# Patient Record
Sex: Male | Born: 1935 | Race: Black or African American | Hispanic: No | Smoking: Former smoker
Health system: Southern US, Community
[De-identification: ages and names within clinical notes are randomized; demographics above are authoritative.]

## PROBLEM LIST (undated history)

## (undated) DIAGNOSIS — I639 Cerebral infarction, unspecified: Secondary | ICD-10-CM

## (undated) DIAGNOSIS — N183 Chronic kidney disease, stage 3 unspecified: Secondary | ICD-10-CM

## (undated) DIAGNOSIS — I493 Ventricular premature depolarization: Secondary | ICD-10-CM

## (undated) DIAGNOSIS — E785 Hyperlipidemia, unspecified: Secondary | ICD-10-CM

## (undated) DIAGNOSIS — I10 Essential (primary) hypertension: Secondary | ICD-10-CM

## (undated) DIAGNOSIS — I4891 Unspecified atrial fibrillation: Secondary | ICD-10-CM

## (undated) DIAGNOSIS — I2510 Atherosclerotic heart disease of native coronary artery without angina pectoris: Secondary | ICD-10-CM

## (undated) DIAGNOSIS — I4819 Other persistent atrial fibrillation: Secondary | ICD-10-CM

## (undated) DIAGNOSIS — R001 Bradycardia, unspecified: Secondary | ICD-10-CM

## (undated) HISTORY — DX: Ventricular premature depolarization: I49.3

## (undated) HISTORY — DX: Cerebral infarction, unspecified: I63.9

## (undated) HISTORY — DX: Hyperlipidemia, unspecified: E78.5

## (undated) HISTORY — DX: Atherosclerotic heart disease of native coronary artery without angina pectoris: I25.10

## (undated) HISTORY — DX: Bradycardia, unspecified: R00.1

## (undated) HISTORY — DX: Chronic kidney disease, stage 3 unspecified: N18.30

## (undated) HISTORY — DX: Other persistent atrial fibrillation: I48.19

## (undated) HISTORY — PX: OTHER SURGICAL HISTORY: SHX169

---
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
## 1998-06-22 ENCOUNTER — Ambulatory Visit (HOSPITAL_BASED_OUTPATIENT_CLINIC_OR_DEPARTMENT_OTHER): Admission: RE | Admit: 1998-06-22 | Discharge: 1998-06-22 | Payer: Self-pay | Admitting: Ophthalmology

## 2000-03-06 ENCOUNTER — Ambulatory Visit (HOSPITAL_COMMUNITY): Admission: RE | Admit: 2000-03-06 | Discharge: 2000-03-06 | Payer: Self-pay | Admitting: Gastroenterology

## 2002-07-01 HISTORY — PX: COLONOSCOPY: SHX174

## 2003-02-11 ENCOUNTER — Ambulatory Visit: Admission: RE | Admit: 2003-02-11 | Discharge: 2003-05-13 | Payer: Self-pay | Admitting: Radiation Oncology

## 2003-02-28 ENCOUNTER — Encounter: Admission: RE | Admit: 2003-02-28 | Discharge: 2003-02-28 | Payer: Self-pay | Admitting: Urology

## 2003-02-28 ENCOUNTER — Encounter: Payer: Self-pay | Admitting: Urology

## 2003-04-08 ENCOUNTER — Ambulatory Visit (HOSPITAL_BASED_OUTPATIENT_CLINIC_OR_DEPARTMENT_OTHER): Admission: RE | Admit: 2003-04-08 | Discharge: 2003-04-08 | Payer: Self-pay | Admitting: Urology

## 2003-04-08 ENCOUNTER — Encounter: Payer: Self-pay | Admitting: Urology

## 2003-04-08 ENCOUNTER — Ambulatory Visit (HOSPITAL_COMMUNITY): Admission: RE | Admit: 2003-04-08 | Discharge: 2003-04-08 | Payer: Self-pay | Admitting: Urology

## 2007-03-16 ENCOUNTER — Ambulatory Visit: Payer: Self-pay | Admitting: Internal Medicine

## 2007-03-31 ENCOUNTER — Ambulatory Visit: Payer: Self-pay | Admitting: Internal Medicine

## 2007-09-15 DIAGNOSIS — K648 Other hemorrhoids: Secondary | ICD-10-CM | POA: Insufficient documentation

## 2007-09-15 DIAGNOSIS — C61 Malignant neoplasm of prostate: Secondary | ICD-10-CM | POA: Insufficient documentation

## 2007-09-15 DIAGNOSIS — I10 Essential (primary) hypertension: Secondary | ICD-10-CM | POA: Insufficient documentation

## 2007-09-15 DIAGNOSIS — K520 Gastroenteritis and colitis due to radiation: Secondary | ICD-10-CM | POA: Insufficient documentation

## 2007-09-15 DIAGNOSIS — I2510 Atherosclerotic heart disease of native coronary artery without angina pectoris: Secondary | ICD-10-CM | POA: Insufficient documentation

## 2008-07-01 DIAGNOSIS — C61 Malignant neoplasm of prostate: Secondary | ICD-10-CM

## 2008-07-01 HISTORY — DX: Malignant neoplasm of prostate: C61

## 2008-07-01 HISTORY — PX: INSERTION PROSTATE RADIATION SEED: SUR718

## 2009-05-29 ENCOUNTER — Emergency Department (HOSPITAL_COMMUNITY): Admission: EM | Admit: 2009-05-29 | Discharge: 2009-05-29 | Payer: Self-pay | Admitting: Emergency Medicine

## 2009-07-10 ENCOUNTER — Encounter: Admission: RE | Admit: 2009-07-10 | Discharge: 2009-07-10 | Payer: Self-pay | Admitting: Urology

## 2009-07-11 ENCOUNTER — Ambulatory Visit (HOSPITAL_BASED_OUTPATIENT_CLINIC_OR_DEPARTMENT_OTHER): Admission: RE | Admit: 2009-07-11 | Discharge: 2009-07-11 | Payer: Self-pay | Admitting: Urology

## 2010-09-15 LAB — POCT I-STAT 4, (NA,K, GLUC, HGB,HCT)
Glucose, Bld: 75 mg/dL (ref 70–99)
HCT: 39 % (ref 39.0–52.0)
Hemoglobin: 13.3 g/dL (ref 13.0–17.0)
Potassium: 4.2 mEq/L (ref 3.5–5.1)
Sodium: 142 mEq/L (ref 135–145)

## 2010-09-15 LAB — CBC
HCT: 38.8 % — ABNORMAL LOW (ref 39.0–52.0)
Hemoglobin: 12.9 g/dL — ABNORMAL LOW (ref 13.0–17.0)
MCHC: 33.2 g/dL (ref 30.0–36.0)
MCV: 94.1 fL (ref 78.0–100.0)
Platelets: 278 10*3/uL (ref 150–400)
RBC: 4.13 MIL/uL — ABNORMAL LOW (ref 4.22–5.81)
RDW: 14.4 % (ref 11.5–15.5)
WBC: 4.4 10*3/uL (ref 4.0–10.5)

## 2010-09-15 LAB — BASIC METABOLIC PANEL
BUN: 18 mg/dL (ref 6–23)
CO2: 31 mEq/L (ref 19–32)
Calcium: 9.3 mg/dL (ref 8.4–10.5)
Chloride: 108 mEq/L (ref 96–112)
Creatinine, Ser: 1.37 mg/dL (ref 0.4–1.5)
GFR calc Af Amer: >60 mL/min (ref >60)
GFR calc non Af Amer: 50 mL/min — ABNORMAL LOW (ref >60)
Glucose, Bld: 106 mg/dL — ABNORMAL HIGH (ref 70–99)
Potassium: 4.8 mEq/L (ref 3.5–5.1)
Sodium: 141 mEq/L (ref 135–145)

## 2010-11-13 NOTE — Assessment & Plan Note (Signed)
[facility]                         GASTROENTEROLOGY OFFICE NOTE   NAME:HOPKINSJa, Ohman                     MRN:          [id number]  DATE:03/16/2007                            DOB:          12-14-1929    CHIEF COMPLAINT:  Rectal bleeding.  He also has a history of polyps.   The patient presents.  [facility]  [facility].  He had a negative colonoscopy in 2004 with Dr.  Corinda Gubler.  He apparently has had polyps in the past.  I do not have any  pathology in the chart.  He has intermittent rectal bleeding problems.  Apparently he has had prostate cancer, though he says they told he did  not have it, but he describes prostate seed implantation at the [facility] or here.  It is not entirely clear to me from talking to him.   The rectal bleeding has been on the toilet paper;  certainly after  eating dairy products he seems to have the problem.  It has been a  couple of months since he has had any.  He had been [facility]  to seek attention for either this or his history of polyps.   PAST MEDICAL HISTORY:  1. Multinodular goiter.  2. Hypertension.  3. Diabetes mellitus.  4. Coronary artery bypass grafting in 2007.  I do not think he has had      an MI, though we are not sure.  5. The original colonoscopy was 1998, and polyps were cauterized to      destruction.  6. 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 Vernie Ammons.   FAMILY HISTORY:  No colon cancer.   SOCIAL HISTORY:  He is divorced, retired from the [employer].  He is  a Cytogeneticist.   REVIEW OF SYSTEMS:  See in my medical history form.   PHYSICAL EXAM:  Overweight black man.  Height 5 feet 11 inches, weight 259 pounds, blood pressure 130/70, pulse  68.  Eyes anicteric.  LUNGS:  Clear.  HEART:  S1, S2.  ABDOMEN:  Soft, nontender.  PSYCH:  He is alert and oriented x3.   ASSESSMENT:   Rectal bleeding.  He has a history of colon polyps, though  the last several exams have been clear of polyps, and the original  polyps were tiny polyps cauterized to destruction.   PLAN:  Schedule colonoscopy to investigate the rectal bleeding.  This  certainly could come from seed implantation and radiation proctitis.  Colorectal neoplasia should be excluded.   We will hold his glipizide the morning of the procedure.   Risks, benefits, and indications were explained.  He understands and  agrees to proceed.     Iva Boop, MD,FACG  Electronically Signed    CEG/MedQ  DD: 03/16/2007  DT: 03/16/2007  Job #: [id number]

## 2010-11-16 NOTE — Procedures (Signed)
[facility]  Patient:    Dillon Smith, Dillon Smith                       MRN: [id number] Proc. Date: 03/06/00 Attending:  Ulyess Mort, M.D. [facility]                           Procedure Report  PROCEDURE:  Colonoscopy.  INDICATIONS FOR PROCEDURE:  This is a follow-up examination after 3 years for a patient who has had recurrent polyps. The scheduled for the procedure as noted.  ANESTHESIA:  The patient was premedicated with 7 mg of Versed, 75 mcg of fentanyl IV.  DESCRIPTION OF PROCEDURE:  The Olympus colonoscope was advanced through the entire colon without difficulty. On retraction, a well prepped colon revealed 2 small polyps approximately 4 mm in size that were sessile. I cauterized them both with the hot biopsy cautery. The endoscope was then retracted completely. The patient tolerated the procedure nicely.  IMPRESSION:  Colonoscopy to the cecum with several polyps and some mild diverticular disease. The polyps were cauterized to destruction as noted, I think this patient should have annual colon screenings and a repeat colonoscopic examination within 3 years. DD:  03/06/00 TD:  03/07/00 Job: [id number]

## 2010-11-16 NOTE — Op Note (Signed)
NAME:  Dillon Smith, Dillon Smith                        ACCOUNT NO.:  [id number]   MEDICAL RECORD NO.:  [id number]                   PATIENT TYPE:  AMB   LOCATION:  [facility]                                 FACILITY:  [facility]   PHYSICIAN:  Mark C. Vernie Ammons, M.D.               DATE OF BIRTH:  11/29/1929   DATE OF PROCEDURE:  04/08/2003  DATE OF DISCHARGE:                                 OPERATIVE REPORT   PREOPERATIVE DIAGNOSIS:  Adenocarcinoma of the prostate.   POSTOPERATIVE DIAGNOSIS:  Adenocarcinoma of the prostate.   PROCEDURE:  I-125 seed implant.   SURGEON:  Mark C. Vernie Ammons, M.D.   RADIATION ONCOLOGIST:  Wynn Banker, M.D.   DRAINS:  3 French Foley catheter.   ESTIMATED BLOOD LOSS:  Less than 5 mL.   NUMBER OF NEEDLES:  27   NUMBER OF SEEDS:  104   COMPLICATIONS:  None.   INDICATIONS FOR PROCEDURE:  The patient is a 77 year old black male who was  found to have an elevated PSA of 4.5 with a ratio of 12%. A biopsy of the  prostate revealed no evidence of extra prostatic spread and Gleason 6 in 2  of 4 cores from the right lobe, BPH from the left lobe at the time of his  biopsy. We discussed the risks, complications and alternatives, he  understands and wished to proceed with surgery.   DESCRIPTION OF PROCEDURE:  After informed consent, the patient brought to  the major OR, placed on the table, administered general anesthesia and then  moved to the dorsal lithotomy position. The transrectal ultrasound probe was  then placed in the rectum and affixed to the stand. A 16 French Foley  catheter was placed in the bladder with dilute contrast and the placement of  the preop seeds based on preoperative planning was compared to real-time  ultrasonographic use of the prostate. This was then used for further  planning in order to afford the greatest amount of coverage with least risk  of urethral irritation and/or injury.   After completing the real time planning using both  real-time ultrasonography  and fluoroscopy, two gold seeds were placed followed by stabilizing needles  and the placement of all needles was performed without difficulty.   The ultrasound was probed, Foley catheter were then removed. Cystoscopy was  performed and revealed a very small (approximately 1 mm) area at about 11  o'clock just inside the bladder neck where a portion of the Vicryl could be  visualized. No seeds could be visualized in this location. The bladder was  fully inspected and noted to be free of tumor, stones, seeds or inflammatory  lesions. Retroflexion of the scope revealed no seeds protruding from the  base of the prostate into the bladder. The cystoscope was then removed and a  new 16 French Foley catheter was then placed in the bladder, connected to  close system drainage. The patient tolerated  the procedure well. There were  no intraoperative complications.   She will be maintained on Cipro 250 mg b.i.d. for 10 days, Flomax 0.4 mg  h.s. #30, and given a prescription for Vicodin ES #28 and Pyridium 200 mg  #30. He will followup in my office in three weeks. His catheter will be left  indwelling overnight and will be removed by the patient in the morning.                                               Mark C. Vernie Ammons, M.D.    MCO/MEDQ  D:  04/08/2003  T:  04/08/2003  Job:  [id number]   cc:   Wynn Banker, M.D.  [address] [facility]  [place]  [place]  [address]  Fax: [phone]

## 2019-08-02 DIAGNOSIS — U071 COVID-19: Secondary | ICD-10-CM

## 2019-08-02 HISTORY — DX: COVID-19: U07.1

## 2019-08-15 ENCOUNTER — Emergency Department (HOSPITAL_COMMUNITY): Payer: No Typology Code available for payment source | Admitting: Anesthesiology

## 2019-08-15 ENCOUNTER — Inpatient Hospital Stay (HOSPITAL_COMMUNITY)
Admission: EM | Admit: 2019-08-15 | Discharge: 2019-08-17 | DRG: 061 | Disposition: A | Discharge status: Home Health | Payer: No Typology Code available for payment source | Attending: Neurology | Admitting: Neurology

## 2019-08-15 ENCOUNTER — Emergency Department (HOSPITAL_COMMUNITY): Payer: No Typology Code available for payment source

## 2019-08-15 ENCOUNTER — Encounter (HOSPITAL_COMMUNITY)
Admission: EM | Disposition: A | Discharge status: Home Health | Payer: Self-pay | Source: Home / Self Care | Attending: Neurology

## 2019-08-15 ENCOUNTER — Encounter (HOSPITAL_COMMUNITY): Payer: Self-pay | Admitting: Student in an Organized Health Care Education/Training Program

## 2019-08-15 ENCOUNTER — Other Ambulatory Visit: Payer: Self-pay

## 2019-08-15 DIAGNOSIS — I361 Nonrheumatic tricuspid (valve) insufficiency: Secondary | ICD-10-CM | POA: Diagnosis not present

## 2019-08-15 DIAGNOSIS — G8194 Hemiplegia, unspecified affecting left nondominant side: Secondary | ICD-10-CM | POA: Diagnosis present

## 2019-08-15 DIAGNOSIS — Z923 Personal history of irradiation: Secondary | ICD-10-CM | POA: Diagnosis not present

## 2019-08-15 DIAGNOSIS — U071 COVID-19: Secondary | ICD-10-CM | POA: Diagnosis present

## 2019-08-15 DIAGNOSIS — E871 Hypo-osmolality and hyponatremia: Secondary | ICD-10-CM | POA: Diagnosis present

## 2019-08-15 DIAGNOSIS — Z87891 Personal history of nicotine dependence: Secondary | ICD-10-CM

## 2019-08-15 DIAGNOSIS — R739 Hyperglycemia, unspecified: Secondary | ICD-10-CM | POA: Diagnosis present

## 2019-08-15 DIAGNOSIS — I10 Essential (primary) hypertension: Secondary | ICD-10-CM | POA: Diagnosis present

## 2019-08-15 DIAGNOSIS — E785 Hyperlipidemia, unspecified: Secondary | ICD-10-CM | POA: Diagnosis present

## 2019-08-15 DIAGNOSIS — R29810 Facial weakness: Secondary | ICD-10-CM | POA: Diagnosis present

## 2019-08-15 DIAGNOSIS — I482 Chronic atrial fibrillation, unspecified: Secondary | ICD-10-CM | POA: Diagnosis present

## 2019-08-15 DIAGNOSIS — H9190 Unspecified hearing loss, unspecified ear: Secondary | ICD-10-CM | POA: Diagnosis present

## 2019-08-15 DIAGNOSIS — R29713 NIHSS score 13: Secondary | ICD-10-CM | POA: Diagnosis present

## 2019-08-15 DIAGNOSIS — Z8546 Personal history of malignant neoplasm of prostate: Secondary | ICD-10-CM

## 2019-08-15 DIAGNOSIS — I63431 Cerebral infarction due to embolism of right posterior cerebral artery: Secondary | ICD-10-CM | POA: Diagnosis present

## 2019-08-15 DIAGNOSIS — I340 Nonrheumatic mitral (valve) insufficiency: Secondary | ICD-10-CM | POA: Diagnosis not present

## 2019-08-15 DIAGNOSIS — R1310 Dysphagia, unspecified: Secondary | ICD-10-CM | POA: Diagnosis present

## 2019-08-15 DIAGNOSIS — I639 Cerebral infarction, unspecified: Secondary | ICD-10-CM | POA: Diagnosis present

## 2019-08-15 DIAGNOSIS — I2510 Atherosclerotic heart disease of native coronary artery without angina pectoris: Secondary | ICD-10-CM | POA: Diagnosis present

## 2019-08-15 DIAGNOSIS — I351 Nonrheumatic aortic (valve) insufficiency: Secondary | ICD-10-CM | POA: Diagnosis not present

## 2019-08-15 DIAGNOSIS — I4891 Unspecified atrial fibrillation: Secondary | ICD-10-CM | POA: Diagnosis present

## 2019-08-15 DIAGNOSIS — N1831 Chronic kidney disease, stage 3a: Secondary | ICD-10-CM | POA: Diagnosis present

## 2019-08-15 DIAGNOSIS — R471 Dysarthria and anarthria: Secondary | ICD-10-CM | POA: Diagnosis present

## 2019-08-15 DIAGNOSIS — I63411 Cerebral infarction due to embolism of right middle cerebral artery: Principal | ICD-10-CM | POA: Diagnosis present

## 2019-08-15 DIAGNOSIS — Z7901 Long term (current) use of anticoagulants: Secondary | ICD-10-CM

## 2019-08-15 DIAGNOSIS — N1832 Chronic kidney disease, stage 3b: Secondary | ICD-10-CM | POA: Diagnosis present

## 2019-08-15 DIAGNOSIS — I129 Hypertensive chronic kidney disease with stage 1 through stage 4 chronic kidney disease, or unspecified chronic kidney disease: Secondary | ICD-10-CM | POA: Diagnosis present

## 2019-08-15 DIAGNOSIS — E7800 Pure hypercholesterolemia, unspecified: Secondary | ICD-10-CM | POA: Diagnosis not present

## 2019-08-15 DIAGNOSIS — I480 Paroxysmal atrial fibrillation: Secondary | ICD-10-CM | POA: Diagnosis not present

## 2019-08-15 HISTORY — DX: Atherosclerotic heart disease of native coronary artery without angina pectoris: I25.10

## 2019-08-15 HISTORY — DX: Essential (primary) hypertension: I10

## 2019-08-15 LAB — CBC
HCT: 44.9 % (ref 39.0–52.0)
Hemoglobin: 14.9 g/dL (ref 13.0–17.0)
MCH: 30.7 pg (ref 26.0–34.0)
MCHC: 33.2 g/dL (ref 30.0–36.0)
MCV: 92.4 fL (ref 80.0–100.0)
Platelets: 277 10*3/uL (ref 150–400)
RBC: 4.86 MIL/uL (ref 4.22–5.81)
RDW: 13 % (ref 11.5–15.5)
WBC: 3.7 10*3/uL — ABNORMAL LOW (ref 4.0–10.5)
nRBC: 0 % (ref 0.0–0.2)

## 2019-08-15 LAB — APTT: aPTT: 37 seconds — ABNORMAL HIGH (ref 24–36)

## 2019-08-15 LAB — COMPREHENSIVE METABOLIC PANEL
ALT: 41 U/L (ref 0–44)
AST: 48 U/L — ABNORMAL HIGH (ref 15–41)
Albumin: 2.3 g/dL — ABNORMAL LOW (ref 3.5–5.0)
Alkaline Phosphatase: 53 U/L (ref 38–126)
Anion gap: 12 (ref 5–15)
BUN: 23 mg/dL (ref 8–23)
CO2: 20 mmol/L — ABNORMAL LOW (ref 22–32)
Calcium: 8.5 mg/dL — ABNORMAL LOW (ref 8.9–10.3)
Chloride: 99 mmol/L (ref 98–111)
Creatinine, Ser: 1.98 mg/dL — ABNORMAL HIGH (ref 0.61–1.24)
GFR calc Af Amer: 34 mL/min — ABNORMAL LOW (ref >60)
GFR calc non Af Amer: 29 mL/min — ABNORMAL LOW (ref >60)
Glucose, Bld: 126 mg/dL — ABNORMAL HIGH (ref 70–99)
Potassium: 4.5 mmol/L (ref 3.5–5.1)
Sodium: 131 mmol/L — ABNORMAL LOW (ref 135–145)
Total Bilirubin: 0.7 mg/dL (ref 0.3–1.2)
Total Protein: 6.5 g/dL (ref 6.5–8.1)

## 2019-08-15 LAB — DIFFERENTIAL
Abs Immature Granulocytes: 0.01 10*3/uL (ref 0.00–0.07)
Basophils Absolute: 0 10*3/uL (ref 0.0–0.1)
Basophils Relative: 0 %
Eosinophils Absolute: 0 10*3/uL (ref 0.0–0.5)
Eosinophils Relative: 0 %
Immature Granulocytes: 0 %
Lymphocytes Relative: 28 %
Lymphs Abs: 1 10*3/uL (ref 0.7–4.0)
Monocytes Absolute: 0.3 10*3/uL (ref 0.1–1.0)
Monocytes Relative: 9 %
Neutro Abs: 2.3 10*3/uL (ref 1.7–7.7)
Neutrophils Relative %: 63 %

## 2019-08-15 LAB — I-STAT CHEM 8, ED
BUN: 24 mg/dL — ABNORMAL HIGH (ref 8–23)
Calcium, Ion: 1.02 mmol/L — ABNORMAL LOW (ref 1.15–1.40)
Chloride: 101 mmol/L (ref 98–111)
Creatinine, Ser: 1.9 mg/dL — ABNORMAL HIGH (ref 0.61–1.24)
Glucose, Bld: 121 mg/dL — ABNORMAL HIGH (ref 70–99)
HCT: 45 % (ref 39.0–52.0)
Hemoglobin: 15.3 g/dL (ref 13.0–17.0)
Potassium: 4.5 mmol/L (ref 3.5–5.1)
Sodium: 132 mmol/L — ABNORMAL LOW (ref 135–145)
TCO2: 23 mmol/L (ref 22–32)

## 2019-08-15 LAB — CBG MONITORING, ED: Glucose-Capillary: 108 mg/dL — ABNORMAL HIGH (ref 70–99)

## 2019-08-15 LAB — PROTIME-INR
INR: 1.1 (ref 0.8–1.2)
Prothrombin Time: 14 seconds (ref 11.4–15.2)

## 2019-08-15 LAB — HEPARIN LEVEL (UNFRACTIONATED): Heparin Unfractionated: <0.1 IU/mL — ABNORMAL LOW (ref 0.30–0.70)

## 2019-08-15 SURGERY — IR WITH ANESTHESIA
Anesthesia: General

## 2019-08-15 MED ORDER — ACETAMINOPHEN 325 MG PO TABS: 650.0000 mg | ORAL_TABLET | ORAL | Status: DC | PRN

## 2019-08-15 MED ORDER — ASPIRIN 81 MG PO CHEW
CHEWABLE_TABLET | ORAL | Status: AC
  Filled 2019-08-15: qty 1

## 2019-08-15 MED ORDER — FENTANYL CITRATE (PF) 100 MCG/2ML IJ SOLN
INTRAMUSCULAR | Status: AC
  Filled 2019-08-15: qty 2

## 2019-08-15 MED ORDER — PANTOPRAZOLE SODIUM 40 MG IV SOLR
40.0000 mg | Freq: Every day | INTRAVENOUS | Status: DC
  Administered 2019-08-15: 40 mg via INTRAVENOUS
  Filled 2019-08-15: qty 40

## 2019-08-15 MED ORDER — CHLORHEXIDINE GLUCONATE CLOTH 2 % EX PADS
6.0000 | MEDICATED_PAD | Freq: Every day | CUTANEOUS | Status: DC
  Administered 2019-08-15: 6 via TOPICAL

## 2019-08-15 MED ORDER — EPTIFIBATIDE 20 MG/10ML IV SOLN
INTRAVENOUS | Status: AC
  Filled 2019-08-15: qty 10

## 2019-08-15 MED ORDER — SODIUM CHLORIDE 0.9% FLUSH
3.0000 mL | Freq: Once | INTRAVENOUS | Status: AC
  Administered 2019-08-15: 3 mL via INTRAVENOUS

## 2019-08-15 MED ORDER — CLOPIDOGREL BISULFATE 300 MG PO TABS
ORAL_TABLET | ORAL | Status: AC
  Filled 2019-08-15: qty 1

## 2019-08-15 MED ORDER — ACETAMINOPHEN 160 MG/5ML PO SOLN: 650.0000 mg | ORAL | Status: DC | PRN

## 2019-08-15 MED ORDER — ALTEPLASE (STROKE) FULL DOSE INFUSION
0.9000 mg/kg | Freq: Once | INTRAVENOUS | Status: AC
  Administered 2019-08-15: 84.2 mg via INTRAVENOUS
  Filled 2019-08-15: qty 100

## 2019-08-15 MED ORDER — LABETALOL HCL 5 MG/ML IV SOLN: 20.0000 mg | Freq: Once | INTRAVENOUS | Status: DC

## 2019-08-15 MED ORDER — NITROGLYCERIN 1 MG/10 ML FOR IR/CATH LAB
INTRA_ARTERIAL | Status: AC
  Filled 2019-08-15: qty 10

## 2019-08-15 MED ORDER — CLEVIDIPINE BUTYRATE 0.5 MG/ML IV EMUL: 0.0000 mg/h | INTRAVENOUS | Status: DC

## 2019-08-15 MED ORDER — VERAPAMIL HCL 2.5 MG/ML IV SOLN
INTRAVENOUS | Status: AC
  Filled 2019-08-15: qty 2

## 2019-08-15 MED ORDER — SENNOSIDES-DOCUSATE SODIUM 8.6-50 MG PO TABS: 1.0000 | ORAL_TABLET | Freq: Every evening | ORAL | Status: DC | PRN

## 2019-08-15 MED ORDER — TICAGRELOR 90 MG PO TABS
ORAL_TABLET | ORAL | Status: AC
  Filled 2019-08-15: qty 2

## 2019-08-15 MED ORDER — CEFAZOLIN SODIUM-DEXTROSE 2-4 GM/100ML-% IV SOLN
INTRAVENOUS | Status: AC
  Filled 2019-08-15: qty 100

## 2019-08-15 MED ORDER — ACETAMINOPHEN 650 MG RE SUPP: 650.0000 mg | RECTAL | Status: DC | PRN

## 2019-08-15 MED ORDER — SODIUM CHLORIDE 0.9 % IV SOLN: INTRAVENOUS | Status: DC

## 2019-08-15 MED ORDER — SODIUM CHLORIDE 0.9 % IV SOLN
50.0000 mL | Freq: Once | INTRAVENOUS | Status: AC
  Administered 2019-08-15: 50 mL via INTRAVENOUS

## 2019-08-15 MED ORDER — TIROFIBAN HCL IN NACL 5-0.9 MG/100ML-% IV SOLN
INTRAVENOUS | Status: AC
  Filled 2019-08-15: qty 100

## 2019-08-15 MED ORDER — STROKE: EARLY STAGES OF RECOVERY BOOK
Freq: Once | Status: AC
  Filled 2019-08-15: qty 1

## 2019-08-15 MED ORDER — IOHEXOL 350 MG/ML SOLN
100.0000 mL | Freq: Once | INTRAVENOUS | Status: AC | PRN
  Administered 2019-08-15: 100 mL via INTRAVENOUS

## 2019-08-15 NOTE — H&P (Signed)
STROKE H&P S/P tPA Neurological ICU admission  CC: Left-sided weakness, right-sided gaze, slurred speech  History is obtained from: Family, EMS  HPI: Dillon Smith is a 86 y.o. male veteran of the [place] with a history of atrial fibrillation not on anticoagulation, coronary artery disease and hypertension who gets his medical care at the [facility], presented via EMS when a friend noted that the patient was slurring his words, had a left facial droop and weak on the left side and had a gaze preference to the right side.  He was seen last normal at 4:30 PM by a family friend and noted later around 6 PM with the symptoms described above. He was brought in by EMS as a code stroke. Evaluated on the bridge.  Van positive for LVO. Taken for stat CT head.  No evidence of bleed.  IV TPA started. Case discussed with endovascular and IR team activated. There was a significant amount of delay in obtaining vessel imaging due to inability to obtain proper access.  He only had 1 line which was being used for TPA and it was not at a location amenable for CT angio or perfusion. After obtaining vessel imaging, there was no emergent proximal large vessel occlusion.  CT perfusion study did not yield an interpretable result. Interventional code was canceled at that time  No chest pain shortness of breath nausea vomiting.  No tingling numbness.  No headache. Received for short of his COVID-19 vaccination yesterday.  LKW: 1630 hrs. on 08/15/2019 tpa given?:  Yes Premorbid modified Rankin scale (mRS): 2 -lives independently, takes care of ADLs and finances independently.  Sometimes uses a cane very rarely a walker for balance if needed but is ambulatory without support otherwise.   ROS: Review of systems obtained and is negative except noted in the HPI  Past Medical History:  Diagnosis Date  . A-fib ([place])   . Coronary artery disease   . Hypertension    History reviewed. No pertinent family  history. No family still stroke  Social History:   reports that he has quit smoking. He has never used smokeless tobacco. No history on file for alcohol and drug.  Medications  Current Facility-Administered Medications:  .   stroke: mapping our early stages of recovery book, , Does not apply, Once, Amie Portland, MD .  alteplase (ACTIVASE) 1 mg/mL infusion 84.2 mg, 0.9 mg/kg, Intravenous, Once **FOLLOWED BY** 0.9 %  sodium chloride infusion, 50 mL, Intravenous, Once, Amie Portland, MD .  0.9 %  sodium chloride infusion, , Intravenous, Continuous, Amie Portland, MD .  acetaminophen (TYLENOL) tablet 650 mg, 650 mg, Oral, Q4H PRN **OR** acetaminophen (TYLENOL) 160 MG/5ML solution 650 mg, 650 mg, Per Tube, Q4H PRN **OR** acetaminophen (TYLENOL) suppository 650 mg, 650 mg, Rectal, Q4H PRN, Amie Portland, MD .  labetalol (NORMODYNE) injection 20 mg, 20 mg, Intravenous, Once, Stopped at 08/15/19 2109 **AND** clevidipine (CLEVIPREX) infusion 0.5 mg/mL, 0-21 mg/hr, Intravenous, Continuous, Amie Portland, MD, Stopped at 08/15/19 2109 .  pantoprazole (PROTONIX) injection 40 mg, 40 mg, Intravenous, QHS, Amie Portland, MD .  senna-docusate (Senokot-S) tablet 1 tablet, 1 tablet, Oral, QHS PRN, Amie Portland, MD .  sodium chloride flush (NS) 0.9 % injection 3 mL, 3 mL, Intravenous, Once, Plunkett, Whitney, MD  Current Outpatient Medications:  .  amLODipine (NORVASC) 10 MG tablet, Take 10 mg by mouth daily., Disp: , Rfl:  .  aspirin EC 81 MG tablet, Take 81 mg by mouth daily., Disp: , Rfl:  .  metoprolol tartrate (LOPRESSOR) 50 MG tablet, Take 50 mg by mouth 2 (two) times daily., Disp: , Rfl:  .  atorvastatin (LIPITOR) 20 MG tablet, Take 20 mg by mouth at bedtime. , Disp: , Rfl:   Exam: Current vital signs: BP (!) 143/63   Pulse 88   Temp 98.6 F (37 C) (Oral)   Resp 16   Wt 93.5 kg   SpO2 96%  Vital signs in last 24 hours: Temp:  [98.6 F (37 C)] 98.6 F (37 C) (02/14 2100) Pulse Rate:  [88] 88  (02/14 2100) Resp:  [16] 16 (02/14 2100) BP: (143)/(63) 143/63 (02/14 2100) SpO2:  [96 %-98 %] 96 % (02/14 2100) Weight:  [93.5 kg] 93.5 kg (02/14 2101) General: Awake alert and oriented x3 HEENT: Normocephalic atraumatic CVS: Regular rate rhythm Lungs clear to auscultation Abdomen nondistended nontender Extremities warm well perfused with trace edema Neurological exam Mental status: Awake alert oriented x3 Severely dysarthric speech No evidence of aphasia Cranial nerves: Pupils are equal round reactive to light, extraocular movement exam shows a forced right gaze, right homonymous hemianopsia, left lower facial weakness, extremely hard of hearing in both ears, tongue and palate were midline. Motor exam: He is antigravity on the left upper extremity without drift but is slightly weaker when compared to the right side.  Left lower extremity had a mild vertical drift.  Right upper and lower extremity with no drift. Sensory exam: Diminished on the left There is extinction on double some to stimulation No obvious dysmetria NIH stroke scale of 13  Repeat exam after TPA: Awake alert oriented x3 Mild dysarthria No aphasia No forced gaze and is able to look both sides without difficulty. No hemianopsia Facial droop is very mild on the left, improved from before. No vertical drift in both upper extremities.  Mild drift in left lower extremity. Minimal sensory deficit NIH 4  Labs I have reviewed labs in epic and the results pertinent to this consultation are:   CBC    Component Value Date/Time   WBC 3.7 (L) 08/15/2019 1942   RBC 4.86 08/15/2019 1942   HGB 15.3 08/15/2019 1948   HCT 45.0 08/15/2019 1948   PLT 277 08/15/2019 1942   MCV 92.4 08/15/2019 1942   MCH 30.7 08/15/2019 1942   MCHC 33.2 08/15/2019 1942   RDW 13.0 08/15/2019 1942   LYMPHSABS 1.0 08/15/2019 1942   MONOABS 0.3 08/15/2019 1942   EOSABS 0.0 08/15/2019 1942   BASOSABS 0.0 08/15/2019 1942    CMP      Component Value Date/Time   NA 132 (L) 08/15/2019 1948   K 4.5 08/15/2019 1948   CL 101 08/15/2019 1948   CO2 20 (L) 08/15/2019 1942   GLUCOSE 121 (H) 08/15/2019 1948   BUN 24 (H) 08/15/2019 1948   CREATININE 1.90 (H) 08/15/2019 1948   CALCIUM 8.5 (L) 08/15/2019 1942   PROT 6.5 08/15/2019 1942   ALBUMIN 2.3 (L) 08/15/2019 1942   AST 48 (H) 08/15/2019 1942   ALT 41 08/15/2019 1942   ALKPHOS 53 08/15/2019 1942   BILITOT 0.7 08/15/2019 1942   GFRNONAA 29 (L) 08/15/2019 1942   GFRAA 34 (L) 08/15/2019 1942    Imaging I have reviewed the images obtained: cT-scan of the brain no acute changes.  Aspects 10. CTA head and neck with no emergent proximal LVO.  Right M3 vessel branch occlusion serving the right frontal operculum.  Perfusion data unreliable due to motion.  Assessment:  86 year old past medical history of  coronary artery disease, atrial fibrillation on anticoagulation and hypertension presenting with symptoms consistent of a right MCA syndrome, no bleed on CT head given IV TPA. Interventional radiology team was activated for possible intervention given the clinical exam consistent with large vessel occlusion but on vessel imaging, the occlusion was likely to distal or had probably recannulized after TPA.  Delays in the process: Initially obtaining history from family, then with vascular access for vessel imaging.   Plan: Acute Ischemic Stroke Cerebral infarction due to embolism of right middle cerebral artery  Acuity: Acute Current Suspected Etiology: Cardioembolic Continue Evaluation:  -Admit to: Neurological ICU -Hold Aspirin until 24 hour post tPA neuroimaging is stable and without evidence of bleeding -Blood pressure control, goal of SYS <180 TPA protocol -MRI/ECHO/A1C/Lipid panel. -Hyperglycemia management per SSI to maintain glucose 140-180mg /dL. -PT/OT/ST therapies and recommendations when able  CNS -Close neuro monitoring  Dysarthria Dysphagia following  cerebral infarction  -NPO until cleared by speech -ST -Advance diet as tolerated  Hemiplegia and hemiparesis following cerebral infarction affecting left non-dominant side  -PT/OT -PM&R consult   RESP No active issues   CV Essential hypertension -Aggressive BP control, goal SBP <180 -Labetalol as needed Cleviprex drip as needed  -Obtain echo  Hyperlipidemia, unspecified  - Statin for goal LDL < 70  Chronic atrial fibrillation -Rate control -will need anticoagulation upon discharge  HEME No active issues Check a.m. labs  ENDO Mild hyperglycemia Goal A1c less than 7 Glucose parameters as above  GI/GU GFR 34-CKD stage III. Obtain records from the outside hospital to compare baseline. Gentle hydration with normal saline 75 cc an hour  Fluids/electrolytes -Check a.m. labs.  Replete as necessary. Mild hyponatremia noted.  Continue with normal saline.  ID Possible Aspiration PNA -CXR -NPO -Monitor  Prophylaxis DVT: SCDs GI: PPI Bowel: Docusate senna  Diet: NPO until cleared by speech  Code Status: Full Code     THE FOLLOWING WERE PRESENT ON ADMISSION: Acute ischemic stroke, hemiparesis, possible aspiration pneumonia, CKD stage III, hyponatremia, atrial fibrillation not on anticoagulation   Case discussed with interventional neuroradiology Dr. Estanislado Pandy over the phone and code team was activated.  IR code was then canceled after no proximal LVO identified on vessel imaging.  Appreciate prompt response from the IR team.  Care was discussed with the ED provider Dr. Maryan Rued.  Treatment plan and further management discussed with brother Kamdin Kenon in the emergency room in person. Mr. Rush Landmark Christy-brother-phone number-cell [phone]   -- Amie Portland, MD [facility] Pager: [phone] If 7pm to 7am, please call on call as listed on AMION.   CRITICAL CARE ATTESTATION Performed by: Amie Portland, MD Total critical care time:  45 minutes Critical care time was exclusive of separately billable procedures and treating other patients and/or supervising APPs/Residents/Students Critical care was necessary to treat or prevent imminent or life-threatening deterioration due to acute ischemic stroke, IV thrombolysis This patient is critically ill and at significant risk for neurological worsening and/or death and care requires constant monitoring. Critical care was time spent personally by me on the following activities: development of treatment plan with patient and/or surrogate as well as nursing, discussions with consultants, evaluation of patient's response to treatment, examination of patient, obtaining history from patient or surrogate, ordering and performing treatments and interventions, ordering and review of laboratory studies, ordering and review of radiographic studies, pulse oximetry, re-evaluation of patient's condition, participation in multidisciplinary rounds and medical decision making of high complexity in the care of this patient.

## 2019-08-15 NOTE — ED Triage Notes (Signed)
Pt brought in by Ems as code stroke. LKW at 73 when family came home. Pt alert upon arrival with slurred speech and R gaze deviation with L sided weakness. Per EMS pt has Afib on aspirin. Not taking any other blood thinners per family.  CBG 141 by EMS 18G LFA established by EMS. Flushes with no s/s of infiltration and is secured with tape and tegaderm Labs drawn and sent by phlebotomy at time of arrival

## 2019-08-15 NOTE — ED Notes (Signed)
Family in family room A

## 2019-08-15 NOTE — ED Notes (Signed)
Late entry due to continuous pt care. TPA initiated with pharmacy in [place] room.  Pt on portable cardiac monitor entire time.  VS remained stable and WNL entire time Pt transported to Rm 35 and placed on continuous monitor

## 2019-08-15 NOTE — ED Notes (Signed)
Report called and given to [facility], Therapist, sports. All questions answered

## 2019-08-15 NOTE — ED Notes (Signed)
Pt transported to [place] in [facility] with all belongings. Pt alert, speaking in full sentences. Neuro checks stable. Breathing easy, non-labored. Equal rise and fall of chest noted. VSS

## 2019-08-15 NOTE — Progress Notes (Signed)
PHARMACIST CODE STROKE RESPONSE  Notified to mix tPA at 1950 by Dr. Rory Percy Delivered tPA to RN at 1953  tPA dose = 8.4mg  bolus over 1 minute followed by 75.8mg  for a total dose of 84.2mg  over 1 hour  Issues/delays encountered (if applicable):   Duanne Limerick PharmD. BCPS 08/15/19 8:00 PM

## 2019-08-15 NOTE — Anesthesia Preprocedure Evaluation (Signed)
Anesthesia Evaluation Anesthesia Physical Anesthesia Plan  ASA: IV and emergent  Anesthesia Plan: General   Post-op Pain Management:    Induction: Intravenous, Rapid sequence and Cricoid pressure planned  PONV Risk Score and Plan: 2 and Ondansetron and Dexamethasone  Airway Management Planned: Oral ETT  Additional Equipment: Arterial line  Intra-op Plan:   Post-operative Plan: Possible Post-op intubation/ventilation  Informed Consent:     History available from chart only and Only emergency history available  Plan Discussed with: CRNA and Surgeon  Anesthesia Plan Comments:         Anesthesia Quick Evaluation

## 2019-08-15 NOTE — ED Provider Notes (Signed)
[facility] Provider Note   CSN: [id number] Arrival date & time: 08/15/19  1939     History No chief complaint on file.   [facility] is a 86 y.o. male.  Patient is an 86 year old male with a history of hypertension, CAD, prostate cancer status post treatment who presents today as a code stroke.  EMS reported that family law saw him normal at 4:30 PM and around 7:00 they noticed that he was looking to his right and having weakness on the left side of his body.  EMS reports neglect, slurred speech which reportedly is different for the patient.  There is no history of anticoagulation use.  Patient is not able to give any significant history but does deny any pain, shortness of breath, cough, nausea or vomiting.  The history is provided by the patient, the EMS personnel and a relative. The history is limited by the condition of the patient.       No past medical history on file.  Patient Active Problem List   Diagnosis Date Noted  . CARCINOMA, PROSTATE 09/15/2007  . HYPERTENSION 09/15/2007  . CAD 09/15/2007  . HEMORRHOIDS, INTERNAL 09/15/2007  . RADIATION PROCTITIS 09/15/2007         No family history on file.  Social History   Tobacco Use  . Smoking status: Not on file  Substance Use Topics  . Alcohol use: Not on file  . Drug use: Not on file    Home Medications Prior to Admission medications   Not on File    Allergies    Patient has no allergy information on record.  Review of Systems   Review of Systems  Unable to perform ROS: Mental status change    Physical Exam Updated Vital Signs There were no vitals taken for this visit.  Physical Exam Vitals and nursing note reviewed.  Constitutional:      General: He is not in acute distress.    Appearance: Normal appearance. He is well-developed and normal weight.  HENT:     Head: Normocephalic and atraumatic.  Eyes:     Conjunctiva/sclera: Conjunctivae normal.      Pupils: Pupils are equal, round, and reactive to light.  Neck:     Comments: Palpable soft mass present over the sternal notch.   Cardiovascular:     Rate and Rhythm: Normal rate and regular rhythm.     Pulses: Normal pulses.     Heart sounds: No murmur.  Pulmonary:     Effort: Pulmonary effort is normal. No respiratory distress.     Breath sounds: Normal breath sounds. No wheezing or rales.  Abdominal:     General: There is no distension.     Palpations: Abdomen is soft.     Tenderness: There is no abdominal tenderness. There is no guarding or rebound.  Musculoskeletal:        General: No tenderness. Normal range of motion.     Cervical back: Normal range of motion and neck supple.  Skin:    General: Skin is warm and dry.     Findings: No erythema or rash.  Neurological:     Mental Status: He is alert.     Motor: No pronator drift.     Comments: Oriented to person and place.  Left hemineglect with right eye deviation.  Patient will not look past midline.  No notable weakness in the left side.  Mild slurred speech.  Psychiatric:  Mood and Affect: Mood normal.        Behavior: Behavior normal.     ED Results / Procedures / Treatments   Labs (all labs ordered are listed, but only abnormal results are displayed) Labs Reviewed  APTT - Abnormal; Notable for the following components:      Result Value   aPTT 37 (*)    All other components within normal limits  CBC - Abnormal; Notable for the following components:   WBC 3.7 (*)    All other components within normal limits  COMPREHENSIVE METABOLIC PANEL - Abnormal; Notable for the following components:   Sodium 131 (*)    CO2 20 (*)    Glucose, Bld 126 (*)    Creatinine, Ser 1.98 (*)    Calcium 8.5 (*)    Albumin 2.3 (*)    AST 48 (*)    GFR calc non Af Amer 29 (*)    GFR calc Af Amer 34 (*)    All other components within normal limits  HEPARIN LEVEL (UNFRACTIONATED) - Abnormal; Notable for the following  components:   Heparin Unfractionated <0.10 (*)    All other components within normal limits  I-STAT CHEM 8, ED - Abnormal; Notable for the following components:   Sodium 132 (*)    BUN 24 (*)    Creatinine, Ser 1.90 (*)    Glucose, Bld 121 (*)    Calcium, Ion 1.02 (*)    All other components within normal limits  CBG MONITORING, ED - Abnormal; Notable for the following components:   Glucose-Capillary 108 (*)    All other components within normal limits  PROTIME-INR  DIFFERENTIAL  HEMOGLOBIN A1C  LIPID PANEL    EKG EKG Interpretation  Date/Time:  Sunday August 15 2019 20:53:19 EST Ventricular Rate:  97 PR Interval:    QRS Duration: 103 QT Interval:  371 QTC Calculation: 472 R Axis:   134 Text Interpretation: new Atrial fibrillation Ventricular premature complex Right axis deviation Probable anteroseptal infarct, old Minimal ST depression Confirmed by Blanchie Dessert [phone]) on 08/15/2019 9:13:26 PM   Radiology CT Code Stroke CTA Head W/WO contrast  Result Date: 08/15/2019 CLINICAL DATA:  Right-sided gaze disturbance. Slurred speech. Left-sided weakness. EXAM: CT ANGIOGRAPHY HEAD AND NECK CT PERFUSION BRAIN TECHNIQUE: Multidetector CT imaging of the head and neck was performed using the standard protocol during bolus administration of intravenous contrast. Multiplanar CT image reconstructions and MIPs were obtained to evaluate the vascular anatomy. Carotid stenosis measurements (when applicable) are obtained utilizing NASCET criteria, using the distal internal carotid diameter as the denominator. Multiphase CT imaging of the brain was performed following IV bolus contrast injection. Subsequent parametric perfusion maps were calculated using RAPID software. CONTRAST:  19mL OMNIPAQUE IOHEXOL 350 MG/ML SOLN COMPARISON:  Head CT earlier same day. FINDINGS: CTA NECK FINDINGS Aortic arch: Aortic atherosclerosis. Branching pattern is normal without origin stenosis. Right carotid system:  Common carotid artery widely patent to the bifurcation. Soft and calcified plaque at the carotid bifurcation and ICA bulb. Minimal diameter of the ICA bulb is 3.7 mm. Compared to a more distal cervical ICA diameter of 4.3 mm, this indicates a 15% stenosis. Left carotid system: Common carotid artery widely patent to the bifurcation. Calcified plaque at the carotid bifurcation and ICA bulb. Minimal diameter is 4.5 mm, the same as the more distal cervical ICA. Therefore there is no stenosis. The cervical ICA is markedly tortuous. Vertebral arteries: There is calcified plaque at both vertebral artery origins. Severe stenosis  on the right, 80% or greater. Moderate stenosis on the left, 50-75%. Both vertebral arteries do show flow to the foramen magnum. Skeleton: Ordinary spondylosis. Other neck: Soft tissue lipoma of the anterior midline neck. Upper chest: Mild peripheral scarring on the right. Review of the MIP images confirms the above findings CTA HEAD FINDINGS Anterior circulation: Both internal carotid arteries are patent through the skull base and siphon regions. There is siphon atherosclerotic calcification but without stenosis greater than 30%. No large or medium vessel occlusion identified on the left. There is a single azygos anterior cerebral artery supplying both anterior cerebral artery territories. On the right, there is some atherosclerotic irregularity of the M1 segment but no flow limiting stenosis. There is an occluded M3 branch serving the frontal operculum. Posterior circulation: Both vertebral arteries are patent through the foramen magnum to the basilar. Before atherosclerotic disease but without stenosis greater than 30%. 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 Superior cerebellar and posterior cerebral arteries show flow. Venous sinuses: Patent and normal. Anatomic variants: None significant. Review of  the MIP images confirms the above findings CT Brain Perfusion Findings: ASPECTS: 10 Perfusion data is not reliable. In looking at the source data, there does appear to be a small region of diminished cerebral blood volume in the right frontal operculum, estimated at about 15 cc. There is also diminished cerebral blood flow and delayed mean transit time in that region. 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 Perfusion data is unreliable because of motion. Viewing the source images, the patient does demonstrate a region of diminished cerebral blood volume, cerebral blood flow and prolonged mean transit time in the frontal operculum ir region, affecting about 15-20 cc of brain. Atherosclerotic disease at the aortic arch. Atherosclerotic disease at both carotid bifurcations. 15% ICA stenosis on the right. No stenosis on the left. Atherosclerotic disease in the carotid siphon regions but without stenosis greater than 30%. Stenosis of both vertebral artery origins, severe on the right and moderate on the left. 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 Findings discussed with Dr. Rory Percy during performance. Electronically Signed   By: Nelson Chimes M.D.   On: 08/15/2019 20:56   CT Code Stroke CTA Neck W/WO contrast  Result Date: 08/15/2019 CLINICAL DATA:  Right-sided gaze disturbance. Slurred speech. Left-sided weakness. EXAM: CT ANGIOGRAPHY HEAD AND NECK CT PERFUSION BRAIN TECHNIQUE: Multidetector CT imaging of the head and neck was performed using the standard protocol during bolus administration of intravenous contrast. Multiplanar CT image reconstructions and MIPs were obtained to evaluate the vascular anatomy. Carotid stenosis measurements (when applicable) are obtained utilizing NASCET criteria, using the distal internal carotid diameter as the denominator. Multiphase CT imaging of the brain was performed following IV bolus contrast injection. Subsequent parametric  perfusion maps were calculated using RAPID software. CONTRAST:  145mL OMNIPAQUE IOHEXOL 350 MG/ML SOLN COMPARISON:  Head CT earlier same day. FINDINGS: CTA NECK FINDINGS Aortic arch: Aortic atherosclerosis. Branching pattern is normal without origin stenosis. Right carotid system: Common carotid artery widely patent to the bifurcation. Soft and calcified plaque at the carotid bifurcation and ICA bulb. Minimal diameter of the ICA bulb is 3.7 mm. Compared to a more distal cervical ICA diameter of 4.3 mm, this indicates a 15% stenosis. Left carotid system: Common carotid artery widely patent to the bifurcation. Calcified plaque at the carotid bifurcation and ICA bulb. Minimal diameter is 4.5  mm, the same as the more distal cervical ICA. Therefore there is no stenosis. The cervical ICA is markedly tortuous. Vertebral arteries: There is calcified plaque at both vertebral artery origins. Severe stenosis on the right, 80% or greater. Moderate stenosis on the left, 50-75%. Both vertebral arteries do show flow to the foramen magnum. Skeleton: Ordinary spondylosis. Other neck: Soft tissue lipoma of the anterior midline neck. Upper chest: Mild peripheral scarring on the right. Review of the MIP images confirms the above findings CTA HEAD FINDINGS Anterior circulation: Both internal carotid arteries are patent through the skull base and siphon regions. There is siphon atherosclerotic calcification but without stenosis greater than 30%. No large or medium vessel occlusion identified on the left. There is a single azygos anterior cerebral artery supplying both anterior cerebral artery territories. On the right, there is some atherosclerotic irregularity of the M1 segment but no flow limiting stenosis. There is an occluded M3 branch serving the frontal operculum. Posterior circulation: Both vertebral arteries are patent through the foramen magnum to the basilar. Before atherosclerotic disease but without stenosis greater than 30%.  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 Superior cerebellar and posterior cerebral arteries show flow. Venous sinuses: Patent and normal. Anatomic variants: None significant. Review of the MIP images confirms the above findings CT Brain Perfusion Findings: ASPECTS: 10 Perfusion data is not reliable. In looking at the source data, there does appear to be a small region of diminished cerebral blood volume in the right frontal operculum, estimated at about 15 cc. There is also diminished cerebral blood flow and delayed mean transit time in that region. 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 Perfusion data is unreliable because of motion. Viewing the source images, the patient does demonstrate a region of diminished cerebral blood volume, cerebral blood flow and prolonged mean transit time in the frontal operculum ir region, affecting about 15-20 cc of brain. Atherosclerotic disease at the aortic arch. Atherosclerotic disease at both carotid bifurcations. 15% ICA stenosis on the right. No stenosis on the left. Atherosclerotic disease in the carotid siphon regions but without stenosis greater than 30%. Stenosis of both vertebral artery origins, severe on the right and moderate on the left. 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 Findings discussed with Dr. Rory Percy during performance. Electronically Signed   By: Nelson Chimes M.D.   On: 08/15/2019 20:56   CT Code Stroke Cerebral Perfusion with contrast  Result Date: 08/15/2019 CLINICAL DATA:  Right-sided gaze disturbance. Slurred speech. Left-sided weakness. EXAM: CT ANGIOGRAPHY HEAD AND NECK CT PERFUSION BRAIN TECHNIQUE: Multidetector CT imaging of the head and neck was performed using the standard protocol during bolus administration of intravenous contrast. Multiplanar CT image reconstructions and MIPs were obtained to  evaluate the vascular anatomy. Carotid stenosis measurements (when applicable) are obtained utilizing NASCET criteria, using the distal internal carotid diameter as the denominator. Multiphase CT imaging of the brain was performed following IV bolus contrast injection. Subsequent parametric perfusion maps were calculated using RAPID software. CONTRAST:  133mL OMNIPAQUE IOHEXOL 350 MG/ML SOLN COMPARISON:  Head CT earlier same day. FINDINGS: CTA NECK FINDINGS Aortic arch: Aortic atherosclerosis. Branching pattern is normal without origin stenosis. Right carotid system: Common carotid artery widely patent to the bifurcation. Soft and calcified plaque at the carotid bifurcation and ICA bulb. Minimal diameter of the ICA bulb is 3.7 mm. Compared to a more distal cervical ICA  diameter of 4.3 mm, this indicates a 15% stenosis. Left carotid system: Common carotid artery widely patent to the bifurcation. Calcified plaque at the carotid bifurcation and ICA bulb. Minimal diameter is 4.5 mm, the same as the more distal cervical ICA. Therefore there is no stenosis. The cervical ICA is markedly tortuous. Vertebral arteries: There is calcified plaque at both vertebral artery origins. Severe stenosis on the right, 80% or greater. Moderate stenosis on the left, 50-75%. Both vertebral arteries do show flow to the foramen magnum. Skeleton: Ordinary spondylosis. Other neck: Soft tissue lipoma of the anterior midline neck. Upper chest: Mild peripheral scarring on the right. Review of the MIP images confirms the above findings CTA HEAD FINDINGS Anterior circulation: Both internal carotid arteries are patent through the skull base and siphon regions. There is siphon atherosclerotic calcification but without stenosis greater than 30%. No large or medium vessel occlusion identified on the left. There is a single azygos anterior cerebral artery supplying both anterior cerebral artery territories. On the right, there is some atherosclerotic  irregularity of the M1 segment but no flow limiting stenosis. There is an occluded M3 branch serving the frontal operculum. Posterior circulation: Both vertebral arteries are patent through the foramen magnum to the basilar. Before atherosclerotic disease but without stenosis greater than 30%. 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 Superior cerebellar and posterior cerebral arteries show flow. Venous sinuses: Patent and normal. Anatomic variants: None significant. Review of the MIP images confirms the above findings CT Brain Perfusion Findings: ASPECTS: 10 Perfusion data is not reliable. In looking at the source data, there does appear to be a small region of diminished cerebral blood volume in the right frontal operculum, estimated at about 15 cc. There is also diminished cerebral blood flow and delayed mean transit time in that region. 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 Perfusion data is unreliable because of motion. Viewing the source images, the patient does demonstrate a region of diminished cerebral blood volume, cerebral blood flow and prolonged mean transit time in the frontal operculum ir region, affecting about 15-20 cc of brain. Atherosclerotic disease at the aortic arch. Atherosclerotic disease at both carotid bifurcations. 15% ICA stenosis on the right. No stenosis on the left. Atherosclerotic disease in the carotid siphon regions but without stenosis greater than 30%. Stenosis of both vertebral artery origins, severe on the right and moderate on the left. 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 Findings discussed with Dr. Rory Percy during performance. Electronically Signed   By: Nelson Chimes M.D.   On: 08/15/2019 20:56   CT HEAD CODE STROKE WO CONTRAST  Result Date: 08/15/2019 CLINICAL DATA:  Code stroke. Right-sided gaze disturbance. Slurred speech.  Left-sided weakness. EXAM: CT HEAD WITHOUT CONTRAST TECHNIQUE: Contiguous axial images were obtained from the base of the skull through the vertex without intravenous contrast. COMPARISON:  None. FINDINGS: Brain: Age related generalized volume loss. Chronic small-vessel ischemic changes affect the cerebral hemispheric white matter. Focal low-density in the external capsule regions bilaterally consistent chronic small vessel change. No sign of acute cortical infarction, mass lesion, hemorrhage, hydrocephalus or extra-axial collection. Vascular: There is atherosclerotic calcification of the major vessels at the base of the brain. Skull: Negative Sinuses/Orbits: Clear/normal Other: None ASPECTS ([place] Stroke Program Early CT Score) - Ganglionic level infarction (caudate, lentiform nuclei, internal capsule, insula, M1-M3 cortex): 7 - Supraganglionic infarction (M4-M6 cortex): 3 Total score (  0-10 with 10 being normal): 10 IMPRESSION: 1. No acute finding by CT. Age related atrophy. Chronic small-vessel ischemic changes throughout the brain as above. 2. ASPECTS is 10. 3. These results were communicated to Dr. Rory Percy at 7:53 pmon 2/14/2021by text page via the [facility] messaging system. Electronically Signed   By: Nelson Chimes M.D.   On: 08/15/2019 19:54    Procedures Procedures (including critical care time)  Medications Ordered in ED Medications  sodium chloride flush (NS) 0.9 % injection 3 mL (has no administration in time range)    ED Course  I have reviewed the triage vital signs and the nursing notes.  Pertinent labs & imaging results that were available during my care of the patient were reviewed by me and considered in my medical decision making (see chart for details).    MDM Rules/Calculators/A&P                      Elderly male presenting today as a code stroke with left-sided hemineglect and last seen normal was 430 as reported by family who was at the scene.  Patient does not have notable  weakness.  Patient will not track beyond midline and favors the right.  No recent history in the medical records on this patient.  His airway is intact and he is awake and alert.  He is in no distress at this time.  Neurology is bedside upon patient arrival.  He went directly to CT which is negative for acute findings.  Family will be contacted for collateral information to discern if he is a TPA candidate.  9:32 PM Patient's PTT and INR are within normal limits, CMP with chronic kidney disease with creatinine of 1.9.  Unclear if that is baseline.  CBC without acute findings.  CTA showed right M3 branch vessel occlusion with unreliable perfusion due to motion.  Given patient's exam and symptoms he was started on TPA.  Now patient will cross midline and look to the left.  He is now moving his left side and recognizing it spontaneously.  Patient will be admitted to the ICU under neurology.  CRITICAL CARE Performed by: Madalee Altmann Total critical care time: 30 minutes Critical care time was exclusive of separately billable procedures and treating other patients. Critical care was necessary to treat or prevent imminent or life-threatening deterioration. Critical care was time spent personally by me on the following activities: development of treatment plan with patient and/or surrogate as well as nursing, discussions with consultants, evaluation of patient's response to treatment, examination of patient, obtaining history from patient or surrogate, ordering and performing treatments and interventions, ordering and review of laboratory studies, ordering and review of radiographic studies, pulse oximetry and re-evaluation of patient's condition.   Final Clinical Impression(s) / ED Diagnoses Final diagnoses:  Acute ischemic stroke [facility])    Rx / DC Orders ED Discharge Orders    None       Blanchie Dessert, MD 08/15/19 2134

## 2019-08-16 ENCOUNTER — Inpatient Hospital Stay (HOSPITAL_COMMUNITY): Payer: No Typology Code available for payment source

## 2019-08-16 DIAGNOSIS — I340 Nonrheumatic mitral (valve) insufficiency: Secondary | ICD-10-CM

## 2019-08-16 DIAGNOSIS — I480 Paroxysmal atrial fibrillation: Secondary | ICD-10-CM

## 2019-08-16 DIAGNOSIS — I10 Essential (primary) hypertension: Secondary | ICD-10-CM

## 2019-08-16 DIAGNOSIS — U071 COVID-19: Secondary | ICD-10-CM

## 2019-08-16 DIAGNOSIS — I361 Nonrheumatic tricuspid (valve) insufficiency: Secondary | ICD-10-CM

## 2019-08-16 DIAGNOSIS — I351 Nonrheumatic aortic (valve) insufficiency: Secondary | ICD-10-CM

## 2019-08-16 DIAGNOSIS — E7800 Pure hypercholesterolemia, unspecified: Secondary | ICD-10-CM

## 2019-08-16 LAB — LIPID PANEL
Cholesterol: 146 mg/dL (ref 0–200)
HDL: 23 mg/dL — ABNORMAL LOW (ref >40)
LDL Cholesterol: 101 mg/dL — ABNORMAL HIGH (ref 0–99)
Total CHOL/HDL Ratio: 6.3 RATIO
Triglycerides: 109 mg/dL (ref <150)
VLDL: 22 mg/dL (ref 0–40)

## 2019-08-16 LAB — BASIC METABOLIC PANEL
Anion gap: 15 (ref 5–15)
BUN: 23 mg/dL (ref 8–23)
CO2: 20 mmol/L — ABNORMAL LOW (ref 22–32)
Calcium: 8.6 mg/dL — ABNORMAL LOW (ref 8.9–10.3)
Chloride: 102 mmol/L (ref 98–111)
Creatinine, Ser: 1.68 mg/dL — ABNORMAL HIGH (ref 0.61–1.24)
GFR calc Af Amer: 42 mL/min — ABNORMAL LOW (ref >60)
GFR calc non Af Amer: 36 mL/min — ABNORMAL LOW (ref >60)
Glucose, Bld: 102 mg/dL — ABNORMAL HIGH (ref 70–99)
Potassium: 4.7 mmol/L (ref 3.5–5.1)
Sodium: 137 mmol/L (ref 135–145)

## 2019-08-16 LAB — CBC
HCT: 46.7 % (ref 39.0–52.0)
Hemoglobin: 15.3 g/dL (ref 13.0–17.0)
MCH: 30.8 pg (ref 26.0–34.0)
MCHC: 32.8 g/dL (ref 30.0–36.0)
MCV: 94 fL (ref 80.0–100.0)
Platelets: 285 10*3/uL (ref 150–400)
RBC: 4.97 MIL/uL (ref 4.22–5.81)
RDW: 13.2 % (ref 11.5–15.5)
WBC: 3.8 10*3/uL — ABNORMAL LOW (ref 4.0–10.5)
nRBC: 0 % (ref 0.0–0.2)

## 2019-08-16 LAB — RESPIRATORY PANEL BY RT PCR (FLU A&B, COVID)
Influenza A by PCR: NEGATIVE
Influenza B by PCR: NEGATIVE
SARS Coronavirus 2 by RT PCR: POSITIVE — AB

## 2019-08-16 LAB — MRSA PCR SCREENING: MRSA by PCR: NEGATIVE

## 2019-08-16 LAB — HEMOGLOBIN A1C
Hgb A1c MFr Bld: 6.3 % — ABNORMAL HIGH (ref 4.8–5.6)
Mean Plasma Glucose: 134.11 mg/dL

## 2019-08-16 LAB — ECHOCARDIOGRAM COMPLETE: Weight: 3301.61 oz

## 2019-08-16 MED ORDER — ATORVASTATIN CALCIUM 10 MG PO TABS: 20.0000 mg | ORAL_TABLET | Freq: Every day | ORAL | Status: DC

## 2019-08-16 MED ORDER — ASPIRIN EC 325 MG PO TBEC
325.0000 mg | DELAYED_RELEASE_TABLET | Freq: Every day | ORAL | Status: DC
  Administered 2019-08-16 – 2019-08-17 (×2): 325 mg via ORAL
  Filled 2019-08-16 (×2): qty 1

## 2019-08-16 MED ORDER — MUPIROCIN 2 % EX OINT
1.0000 "application " | TOPICAL_OINTMENT | Freq: Two times a day (BID) | CUTANEOUS | Status: DC
  Filled 2019-08-16: qty 22

## 2019-08-16 MED ORDER — PANTOPRAZOLE SODIUM 40 MG PO TBEC
40.0000 mg | DELAYED_RELEASE_TABLET | Freq: Every day | ORAL | Status: DC
  Administered 2019-08-16 – 2019-08-17 (×2): 40 mg via ORAL
  Filled 2019-08-16 (×2): qty 1

## 2019-08-16 MED ORDER — ATORVASTATIN CALCIUM 40 MG PO TABS
40.0000 mg | ORAL_TABLET | Freq: Every day | ORAL | Status: DC
  Administered 2019-08-16 – 2019-08-17 (×2): 40 mg via ORAL
  Filled 2019-08-16 (×2): qty 1

## 2019-08-16 MED ORDER — HYDRALAZINE HCL 20 MG/ML IJ SOLN: 5.0000 mg | INTRAMUSCULAR | Status: DC | PRN

## 2019-08-16 NOTE — Progress Notes (Signed)
MRI no show for 1700 appointment, no answer when I call. Stat head ordered to obtain 24 hr post TPA image.

## 2019-08-16 NOTE — Progress Notes (Signed)
STROKE TEAM PROGRESS NOTE   INTERVAL HISTORY Pt sitting in chair, awake alert and following commands, but hard of hearing. Slight left nasolabial fold flattening but moving all extremities equally. Passed swallow, on diet. MRI pending.   Vitals:   08/16/19 0630 08/16/19 0700 08/16/19 0730 08/16/19 0800  BP: (!) 127/57 (!) 148/70 (!) 145/81 (!) 142/82  Pulse: (!) 32 (!) 36 (!) 53 (!) 38  Resp: 15 10 18  (!) 21  Temp:      TempSrc:      SpO2: 94% 97% 95% 94%  Weight:        CBC:  Recent Labs  Lab 08/15/19 1942 08/15/19 1948  WBC 3.7*  --   NEUTROABS 2.3  --   HGB 14.9 15.3  HCT 44.9 45.0  MCV 92.4  --   PLT 277  --     Basic Metabolic Panel:  Recent Labs  Lab 08/15/19 1942 08/15/19 1948  NA 131* 132*  K 4.5 4.5  CL 99 101  CO2 20*  --   GLUCOSE 126* 121*  BUN 23 24*  CREATININE 1.98* 1.90*  CALCIUM 8.5*  --    Lipid Panel:     Component Value Date/Time   CHOL 146 08/16/2019 0242   TRIG 109 08/16/2019 0242   HDL 23 (L) 08/16/2019 0242   CHOLHDL 6.3 08/16/2019 0242   VLDL 22 08/16/2019 0242   LDLCALC 101 (H) 08/16/2019 0242   HgbA1c:  Lab Results  Component Value Date   HGBA1C 6.3 (H) 08/16/2019   Urine Drug Screen: No results found for: LABOPIA, COCAINSCRNUR, LABBENZ, AMPHETMU, THCU, LABBARB  Alcohol Level No results found for: [facility]  IMAGING past 48 hours CT Code Stroke CTA Head W/WO contrast  Result Date: 08/15/2019 CLINICAL DATA:  Right-sided gaze disturbance. Slurred speech. Left-sided weakness. EXAM: CT ANGIOGRAPHY HEAD AND NECK CT PERFUSION BRAIN TECHNIQUE: Multidetector CT imaging of the head and neck was performed using the standard protocol during bolus administration of intravenous contrast. Multiplanar CT image reconstructions and MIPs were obtained to evaluate the vascular anatomy. Carotid stenosis measurements (when applicable) are obtained utilizing NASCET criteria, using the distal internal carotid diameter as the denominator. Multiphase CT  imaging of the brain was performed following IV bolus contrast injection. Subsequent parametric perfusion maps were calculated using RAPID software. CONTRAST:  178mL OMNIPAQUE IOHEXOL 350 MG/ML SOLN COMPARISON:  Head CT earlier same day. FINDINGS: CTA NECK FINDINGS Aortic arch: Aortic atherosclerosis. Branching pattern is normal without origin stenosis. Right carotid system: Common carotid artery widely patent to the bifurcation. Soft and calcified plaque at the carotid bifurcation and ICA bulb. Minimal diameter of the ICA bulb is 3.7 mm. Compared to a more distal cervical ICA diameter of 4.3 mm, this indicates a 15% stenosis. Left carotid system: Common carotid artery widely patent to the bifurcation. Calcified plaque at the carotid bifurcation and ICA bulb. Minimal diameter is 4.5 mm, the same as the more distal cervical ICA. Therefore there is no stenosis. The cervical ICA is markedly tortuous. Vertebral arteries: There is calcified plaque at both vertebral artery origins. Severe stenosis on the right, 80% or greater. Moderate stenosis on the left, 50-75%. Both vertebral arteries do show flow to the foramen magnum. Skeleton: Ordinary spondylosis. Other neck: Soft tissue lipoma of the anterior midline neck. Upper chest: Mild peripheral scarring on the right. Review of the MIP images confirms the above findings CTA HEAD FINDINGS Anterior circulation: Both internal carotid arteries are patent through the skull base and siphon regions.  There is siphon atherosclerotic calcification but without stenosis greater than 30%. No large or medium vessel occlusion identified on the left. There is a single azygos anterior cerebral artery supplying both anterior cerebral artery territories. On the right, there is some atherosclerotic irregularity of the M1 segment but no flow limiting stenosis. There is an occluded M3 branch serving the frontal operculum. Posterior circulation: Both vertebral arteries are patent through the  foramen magnum to the basilar. Before atherosclerotic disease but without stenosis greater than 30%. 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 Superior cerebellar and posterior cerebral arteries show flow. Venous sinuses: Patent and normal. Anatomic variants: None significant. Review of the MIP images confirms the above findings CT Brain Perfusion Findings: ASPECTS: 10 Perfusion data is not reliable. In looking at the source data, there does appear to be a small region of diminished cerebral blood volume in the right frontal operculum, estimated at about 15 cc. There is also diminished cerebral blood flow and delayed mean transit time in that region. 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 Perfusion data is unreliable because of motion. Viewing the source images, the patient does demonstrate a region of diminished cerebral blood volume, cerebral blood flow and prolonged mean transit time in the frontal operculum ir region, affecting about 15-20 cc of brain. Atherosclerotic disease at the aortic arch. Atherosclerotic disease at both carotid bifurcations. 15% ICA stenosis on the right. No stenosis on the left. Atherosclerotic disease in the carotid siphon regions but without stenosis greater than 30%. Stenosis of both vertebral artery origins, severe on the right and moderate on the left. 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 Findings discussed with Dr. Rory Percy during performance. Electronically Signed   By: Nelson Chimes M.D.   On: 08/15/2019 20:56   CT Code Stroke CTA Neck W/WO contrast  Result Date: 08/15/2019 CLINICAL DATA:  Right-sided gaze disturbance. Slurred speech. Left-sided weakness. EXAM: CT ANGIOGRAPHY HEAD AND NECK CT PERFUSION BRAIN TECHNIQUE: Multidetector CT imaging of the head and neck was performed using the standard protocol during bolus administration of  intravenous contrast. Multiplanar CT image reconstructions and MIPs were obtained to evaluate the vascular anatomy. Carotid stenosis measurements (when applicable) are obtained utilizing NASCET criteria, using the distal internal carotid diameter as the denominator. Multiphase CT imaging of the brain was performed following IV bolus contrast injection. Subsequent parametric perfusion maps were calculated using RAPID software. CONTRAST:  162mL OMNIPAQUE IOHEXOL 350 MG/ML SOLN COMPARISON:  Head CT earlier same day. FINDINGS: CTA NECK FINDINGS Aortic arch: Aortic atherosclerosis. Branching pattern is normal without origin stenosis. Right carotid system: Common carotid artery widely patent to the bifurcation. Soft and calcified plaque at the carotid bifurcation and ICA bulb. Minimal diameter of the ICA bulb is 3.7 mm. Compared to a more distal cervical ICA diameter of 4.3 mm, this indicates a 15% stenosis. Left carotid system: Common carotid artery widely patent to the bifurcation. Calcified plaque at the carotid bifurcation and ICA bulb. Minimal diameter is 4.5 mm, the same as the more distal cervical ICA. Therefore there is no stenosis. The cervical ICA is markedly tortuous. Vertebral arteries: There is calcified plaque at both vertebral artery origins. Severe stenosis on the right, 80% or greater. Moderate stenosis on the left, 50-75%. Both vertebral arteries do show flow to the foramen magnum. Skeleton: Ordinary spondylosis. Other neck: Soft tissue lipoma of the anterior midline neck. Upper chest:  Mild peripheral scarring on the right. Review of the MIP images confirms the above findings CTA HEAD FINDINGS Anterior circulation: Both internal carotid arteries are patent through the skull base and siphon regions. There is siphon atherosclerotic calcification but without stenosis greater than 30%. No large or medium vessel occlusion identified on the left. There is a single azygos anterior cerebral artery supplying both  anterior cerebral artery territories. On the right, there is some atherosclerotic irregularity of the M1 segment but no flow limiting stenosis. There is an occluded M3 branch serving the frontal operculum. Posterior circulation: Both vertebral arteries are patent through the foramen magnum to the basilar. Before atherosclerotic disease but without stenosis greater than 30%. 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 Superior cerebellar and posterior cerebral arteries show flow. Venous sinuses: Patent and normal. Anatomic variants: None significant. Review of the MIP images confirms the above findings CT Brain Perfusion Findings: ASPECTS: 10 Perfusion data is not reliable. In looking at the source data, there does appear to be a small region of diminished cerebral blood volume in the right frontal operculum, estimated at about 15 cc. There is also diminished cerebral blood flow and delayed mean transit time in that region. 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 Perfusion data is unreliable because of motion. Viewing the source images, the patient does demonstrate a region of diminished cerebral blood volume, cerebral blood flow and prolonged mean transit time in the frontal operculum ir region, affecting about 15-20 cc of brain. Atherosclerotic disease at the aortic arch. Atherosclerotic disease at both carotid bifurcations. 15% ICA stenosis on the right. No stenosis on the left. Atherosclerotic disease in the carotid siphon regions but without stenosis greater than 30%. Stenosis of both vertebral artery origins, severe on the right and moderate on the left. 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 Findings discussed with Dr. Rory Percy during performance. Electronically Signed   By: Nelson Chimes M.D.   On: 08/15/2019 20:56   CT Code Stroke Cerebral Perfusion with  contrast  Result Date: 08/15/2019 CLINICAL DATA:  Right-sided gaze disturbance. Slurred speech. Left-sided weakness. EXAM: CT ANGIOGRAPHY HEAD AND NECK CT PERFUSION BRAIN TECHNIQUE: Multidetector CT imaging of the head and neck was performed using the standard protocol during bolus administration of intravenous contrast. Multiplanar CT image reconstructions and MIPs were obtained to evaluate the vascular anatomy. Carotid stenosis measurements (when applicable) are obtained utilizing NASCET criteria, using the distal internal carotid diameter as the denominator. Multiphase CT imaging of the brain was performed following IV bolus contrast injection. Subsequent parametric perfusion maps were calculated using RAPID software. CONTRAST:  161mL OMNIPAQUE IOHEXOL 350 MG/ML SOLN COMPARISON:  Head CT earlier same day. FINDINGS: CTA NECK FINDINGS Aortic arch: Aortic atherosclerosis. Branching pattern is normal without origin stenosis. Right carotid system: Common carotid artery widely patent to the bifurcation. Soft and calcified plaque at the carotid bifurcation and ICA bulb. Minimal diameter of the ICA bulb is 3.7 mm. Compared to a more distal cervical ICA diameter of 4.3 mm, this indicates a 15% stenosis. Left carotid system: Common carotid artery widely patent to the bifurcation. Calcified plaque at the carotid bifurcation and ICA bulb. Minimal diameter is 4.5 mm, the same as the more distal cervical ICA. Therefore there is no stenosis. The cervical ICA is markedly tortuous. Vertebral arteries: There is calcified plaque at both vertebral artery origins. Severe stenosis on the right, 80%  or greater. Moderate stenosis on the left, 50-75%. Both vertebral arteries do show flow to the foramen magnum. Skeleton: Ordinary spondylosis. Other neck: Soft tissue lipoma of the anterior midline neck. Upper chest: Mild peripheral scarring on the right. Review of the MIP images confirms the above findings CTA HEAD FINDINGS Anterior  circulation: Both internal carotid arteries are patent through the skull base and siphon regions. There is siphon atherosclerotic calcification but without stenosis greater than 30%. No large or medium vessel occlusion identified on the left. There is a single azygos anterior cerebral artery supplying both anterior cerebral artery territories. On the right, there is some atherosclerotic irregularity of the M1 segment but no flow limiting stenosis. There is an occluded M3 branch serving the frontal operculum. Posterior circulation: Both vertebral arteries are patent through the foramen magnum to the basilar. Before atherosclerotic disease but without stenosis greater than 30%. 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 Superior cerebellar and posterior cerebral arteries show flow. Venous sinuses: Patent and normal. Anatomic variants: None significant. Review of the MIP images confirms the above findings CT Brain Perfusion Findings: ASPECTS: 10 Perfusion data is not reliable. In looking at the source data, there does appear to be a small region of diminished cerebral blood volume in the right frontal operculum, estimated at about 15 cc. There is also diminished cerebral blood flow and delayed mean transit time in that region. 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 Perfusion data is unreliable because of motion. Viewing the source images, the patient does demonstrate a region of diminished cerebral blood volume, cerebral blood flow and prolonged mean transit time in the frontal operculum ir region, affecting about 15-20 cc of brain. Atherosclerotic disease at the aortic arch. Atherosclerotic disease at both carotid bifurcations. 15% ICA stenosis on the right. No stenosis on the left. Atherosclerotic disease in the carotid siphon regions but without stenosis greater than 30%. Stenosis of  both vertebral artery origins, severe on the right and moderate on the left. 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 Findings discussed with Dr. Rory Percy during performance. Electronically Signed   By: Nelson Chimes M.D.   On: 08/15/2019 20:56   CT HEAD CODE STROKE WO CONTRAST  Result Date: 08/15/2019 CLINICAL DATA:  Code stroke. Right-sided gaze disturbance. Slurred speech. Left-sided weakness. EXAM: CT HEAD WITHOUT CONTRAST TECHNIQUE: Contiguous axial images were obtained from the base of the skull through the vertex without intravenous contrast. COMPARISON:  None. FINDINGS: Brain: Age related generalized volume loss. Chronic small-vessel ischemic changes affect the cerebral hemispheric white matter. Focal low-density in the external capsule regions bilaterally consistent chronic small vessel change. No sign of acute cortical infarction, mass lesion, hemorrhage, hydrocephalus or extra-axial collection. Vascular: There is atherosclerotic calcification of the major vessels at the base of the brain. Skull: Negative Sinuses/Orbits: Clear/normal Other: None ASPECTS ([place] Stroke Program Early CT Score) - Ganglionic level infarction (caudate, lentiform nuclei, internal capsule, insula, M1-M3 cortex): 7 - Supraganglionic infarction (M4-M6 cortex): 3 Total score (0-10 with 10 being normal): 10 IMPRESSION: 1. No acute finding by CT. Age related atrophy. Chronic small-vessel ischemic changes throughout the brain as above. 2. ASPECTS is 10. 3. These results were communicated to Dr. Rory Percy at 7:53 pmon 2/14/2021by text page via the [facility] messaging system. Electronically Signed   By: Nelson Chimes M.D.   On: 08/15/2019 19:54    PHYSICAL EXAM  Temp:  [  98.2 F (36.8 C)-98.6 F (37 C)] 98.2 F (36.8 C) (02/15 0400) Pulse Rate:  [32-118] 83 (02/15 0900) Resp:  [10-25] 24 (02/15 0900) BP: (112-173)/(57-104) 123/70 (02/15 0900) SpO2:  [92 %-98 %] 94 % (02/15 0900) Weight:  [93.5 kg-93.6 kg] 93.6 kg (02/15  0000)  General - Well nourished, well developed, in no apparent distress.  Ophthalmologic - fundi not visualized due to noncooperation.  Cardiovascular - irregularly irregular heart rate and rhythm.  Mental Status -  Level of arousal and orientation to month, place, and person were intact. Language including naming, repetition, comprehension was assessed and found intact, paucity of speech. Mild dysarthria  Cranial Nerves II - XII - II - Visual field intact OU. III, IV, VI - Extraocular movements intact. V - Facial sensation intact bilaterally. VII - slight left nasolabial fold flattening. VIII - hard of hearing & vestibular intact bilaterally. X - Palate elevates symmetrically. Mild dysarthria XI - Chin turning & shoulder shrug intact bilaterally. XII - Tongue protrusion intact.  Motor Strength - The patient's strength was normal in all extremities and pronator drift was absent.  Bulk was normal and fasciculations were absent.   Motor Tone - Muscle tone was assessed at the neck and appendages and was normal.  Reflexes - The patient's reflexes were symmetrical in all extremities and he had no pathological reflexes.  Sensory - Light touch, temperature/pinprick were assessed and were symmetrical.    Coordination - The patient had normal movements in the hands with no ataxia or dysmetria.  Tremor was absent.  Gait and Station - deferred.   ASSESSMENT/PLAN Dillon Smith is a 86 y.o. male with history of AF not on AC, CAD, HTN presenting with dysarthria, L facial droop, L hemiparesis and R gaze preference. Received tPA 08/15/2019 at [place].  Stroke: L brain infarct likely embolic secondary to AF not on AC  CT head No acute abnormality. ASPECTS 10.     CTA head & neck R M3 occlusion. B ICA bifurcation atherosclerosis w/ R ICA 15% stenosis. ICA siphon atherosclerosis but w/o > 15% stenosis. 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 BA >80% stenosis.  Aortic atherosclerosis.   CT  perfusion diminished CBV, CBF and MTT in frontal operculum  MRI pending  MRA pending  2D Echo pending  LDL 101  HgbA1c 6.3  SCDs for VTE prophylaxis  No antithrombotic prior to admission, now on No antithrombotic as within 24h of tPA administration.   Therapy recommendations:  pending   Disposition:  pending   COVID-19 Infection  Tested positive on arrival to hospital 2/14  Asymptomatic   No need to treat at this time  Atrial Fibrillation  Home anticoagulation: ASA 81  Not sure why he is not on [facility], pt or her daughter can not tell me  No obvious contraindication for [facility] . Will consider AC for stroke prevention based on MRI    Hypertension  Home meds:  norvasc 10 metoprolol 50 bid - on hold  Stable BP goal < 180/105 x 24h following tPA administration . Long-term BP goal normotensive  Hyperlipidemia  Home meds:  lipitor 20   On lipitor 40  LDL 101, goal < 70  Continue statin at discharge  Dysphagia, resolved . Secondary to stroke . On diet now . 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 This patient's care requires constant monitoring of vital signs, hemodynamics, respiratory and cardiac monitoring, review of multiple databases, neurological assessment, discussion with family, other specialists and medical decision making of high complexity. I spent 35 minutes of neurocritical care time in the care of this patient. I had long discussion with daughter [name] over the phone, updated pt current condition, treatment plan  and potential prognosis, and answered all the questions. She expressed understanding and appreciation.   Rosalin Hawking, MD PhD Stroke Neurology 08/16/2019 2:10 PM   To contact Stroke Continuity provider, please refer to [email or web address] After hours, contact General Neurology

## 2019-08-16 NOTE — Progress Notes (Signed)
  Echocardiogram 2D Echocardiogram has been performed.  Shanaia Sievers G Zaidee Rion 08/16/2019, 2:10 PM

## 2019-08-16 NOTE — Evaluation (Addendum)
Occupational Therapy Evaluation Patient Details Name: Dillon Smith MRN: [id number] DOB: 11/29/1929 Today's Date: 08/16/2019    History of Present Illness 86 y.o. male with PMHx including atrial fibrillation not on anticoagulation, CAD, and HTN who gets his medical care at the [facility], presented via EMS when a friend noted that the patient was slurring his words, had a left facial droop and weak on the left side and had a gaze preference to the right side. 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 CT brain with no acute changes.    Clinical Impression   This 86 y/o male presents with the above. PTA pt reports independence with ADL, iADL and functional mobility, lives alone. Pt presenting supine in bed pleasant and willing to participate in therapy session. Pt is HOH so often requiring repetition of instructions/questions. Pt currently requiring minA for LB ADL and minA-minguard assist for functional mobility, completing x2 laps in room initially using RW progressed to trialling without RW and use of HHA. VSS throughout and pt tolerating well. He will benefit from continued acute OT services and currently recommend follow up [facility] services after discharge to maximize his safety and independence with ADL and mobility. Will follow.     Follow Up Recommendations  Home health OT;Supervision - Intermittent    Equipment Recommendations  Tub/shower seat           Precautions / Restrictions Precautions Precautions: Fall Restrictions Weight Bearing Restrictions: No      Mobility Bed Mobility Overal bed mobility: Needs Assistance Bed Mobility: Supine to Sit     Supine to sit: Min guard     General bed mobility comments: for safety, lines and balance; no assist required  Transfers Overall transfer level: Needs assistance Equipment used: Rolling walker (2 wheeled) Transfers: Sit to/from Stand Sit to Stand: Min assist;+2 safety/equipment          General transfer comment: to rise and steady at RW, VCs for hand placement    Balance Overall balance assessment: Needs assistance Sitting-balance support: Feet supported Sitting balance-Leahy Scale: Good Sitting balance - Comments: donning socks seated EOB   Standing balance support: Bilateral upper extremity supported;No upper extremity supported Standing balance-Leahy Scale: Fair                             ADL either performed or assessed with clinical judgement   ADL Overall ADL's : Needs assistance/impaired Eating/Feeding: NPO   Grooming: Min guard;Standing   Upper Body Bathing: Set up;Sitting   Lower Body Bathing: Minimal assistance;Sit to/from stand   Upper Body Dressing : Set up;Sitting   Lower Body Dressing: Minimal assistance;Sit to/from stand Lower Body Dressing Details (indicate cue type and reason): pt donning socks seated EOB, minA for standing balance Toilet Transfer: Minimal assistance;Min guard;Ambulation;RW Toilet Transfer Details (indicate cue type and reason): pt completing mobility in room using both RW and without RW Toileting- Clothing Manipulation and Hygiene: Minimal assistance;Sit to/from stand       Functional mobility during ADLs: Min guard;Minimal assistance;Rolling walker(with and without RW)       Vision Baseline Vision/History: Wears glasses Wears Glasses: At all times Patient Visual Report: No change from baseline Vision Assessment?: No apparent visual deficits(but will continue to assess)     Perception     Praxis      Pertinent Vitals/Pain Pain Assessment: No/denies pain     Hand Dominance Right   Extremity/Trunk Assessment  Upper Extremity Assessment Upper Extremity Assessment: Overall WFL for tasks assessed(strength appears symmetrical, grossly 4-/5)   Lower Extremity Assessment Lower Extremity Assessment: Defer to PT evaluation       Communication Communication Communication: HOH   Cognition  Arousal/Alertness: Awake/alert Behavior During Therapy: WFL for tasks assessed/performed Overall Cognitive Status: Difficult to assess                                 General Comments: pt very HOH so often required repetition, overall appears [facility] for basic tasks but will continue to assess in functional context    General Comments  VSS on RA throughout session, SBP <180 throughout; received verbal clearance from MD/RN to mobilize pt     Exercises     Shoulder Instructions      Home Living Family/patient expects to be discharged to:: Private residence Living Arrangements: Alone Available Help at Discharge: Friend(s);Available PRN/intermittently Type of Home: House Home Access: Stairs to enter [employer] of Steps: 3 Entrance Stairs-Rails: None       Bathroom Shower/Tub: Teacher, early years/pre: Standard     Home Equipment: Cane - single point;Walker - 2 wheels          Prior Functioning/Environment Level of Independence: Independent        Comments: x1 fall few days PTA reports in the bathroom        OT Problem List: Decreased strength;Decreased range of motion;Decreased activity tolerance;Decreased knowledge of use of DME or AE;Impaired balance (sitting and/or standing)      OT Treatment/Interventions: Self-care/ADL training;Therapeutic exercise;Energy conservation;DME and/or AE instruction;Therapeutic activities;Patient/family education;Balance training    OT Goals(Current goals can be found in the care plan section) Acute Rehab OT Goals Patient Stated Goal: home when able OT Goal Formulation: With patient Time For Goal Achievement: 08/30/19 Potential to Achieve Goals: Good  OT Frequency: Min 2X/week(will benefit from 3x, lives alone)   Barriers to D/C:            Co-evaluation PT/OT/SLP Co-Evaluation/Treatment: Yes Reason for Co-Treatment: For patient/therapist safety;To address functional/ADL transfers   OT goals  addressed during session: ADL's and self-care      AM-PAC OT "6 Clicks" Daily Activity     Outcome Measure Help from another person eating meals?: None Help from another person taking care of personal grooming?: A Little Help from another person toileting, which includes using toliet, bedpan, or urinal?: A Little Help from another person bathing (including washing, rinsing, drying)?: A Little Help from another person to put on and taking off regular upper body clothing?: None Help from another person to put on and taking off regular lower body clothing?: A Little 6 Click Score: 20   End of Session Equipment Utilized During Treatment: Gait belt Nurse Communication: Mobility status  Activity Tolerance: Patient tolerated treatment well Patient left: in chair;with call bell/phone within reach;with chair alarm set  OT Visit Diagnosis: Muscle weakness (generalized) (M62.81);Other symptoms and signs involving the nervous system (R29.898)                Time: [id number] OT Time Calculation (min): 31 min Charges:  OT General Charges $OT Visit: 1 Visit OT Evaluation $OT Eval Moderate Complexity: [place], OT [employer] Pager [phone] Office [phone]   Raymondo Band 08/16/2019, 12:45 PM

## 2019-08-16 NOTE — Evaluation (Signed)
Physical Therapy Evaluation Patient Details Name: Dillon Smith MRN: [id number] DOB: August 07, 1929 Today's Date: 08/16/2019   History of Present Illness  86 y.o. male with PMHx including atrial fibrillation not on anticoagulation, CAD, and HTN who gets his medical care at the [facility], presented via EMS when a friend noted that the patient was slurring his words, had a left facial droop and weak on the left side and had a gaze preference to the right side. TPA administered as presenting with symptoms consistent with right MCA syndrome. CT brain with no acute changes.  Clinical Impression   Pt presents with generalized LE weakness, increased time and effort to mobilize and at times requires light assist to steady, decreased activity tolerance, and fair standing balance. Pt to benefit from acute PT to address deficits. Pt ambulated room distance with and without AD, presents with more unsteadiness without use of RW. PT recommending HHPT to address mobility deficits and prevent falls post-acutely. PT to progress mobility as tolerated, and will continue to follow acutely.      Follow Up Recommendations Home health PT;Supervision for mobility/OOB    Equipment Recommendations  None recommended by PT    Recommendations for Other Services       Precautions / Restrictions Precautions Precautions: Fall Restrictions Weight Bearing Restrictions: No      Mobility  Bed Mobility Overal bed mobility: Needs Assistance Bed Mobility: Supine to Sit     Supine to sit: Min guard     General bed mobility comments: for safety, lines and balance; no assist required  Transfers Overall transfer level: Needs assistance Equipment used: Rolling walker (2 wheeled) Transfers: Sit to/from Stand Sit to Stand: Min assist;+2 safety/equipment         General transfer comment: to rise and steady at RW, VCs for hand placement  Ambulation/Gait Ambulation/Gait assistance: Min assist;+2  safety/equipment Gait Distance (Feet): 55 Feet Assistive device: Rolling walker (2 wheeled);None Gait Pattern/deviations: Step-through pattern;Decreased stride length;Trunk flexed Gait velocity: decr   General Gait Details: Min assist for steadying, lines/leads management. Verbal cuing for placement in RW when using; last 25 ft PT encouraged pt not to use AD as pt states he does not use one at baseline. Pt slower and more unsteady without UE support.  Stairs            Wheelchair Mobility    Modified Rankin (Stroke Patients Only) Modified Rankin (Stroke Patients Only) Pre-Morbid Rankin Score: No significant disability Modified Rankin: Moderately severe disability     Balance Overall balance assessment: Needs assistance;History of Falls(Recent fall at home, in bathroom setting) Sitting-balance support: Feet supported Sitting balance-Leahy Scale: Good Sitting balance - Comments: able to sit EOB without support, tolerates dynamic sitting tasks without LOB   Standing balance support: No upper extremity supported;During functional activity Standing balance-Leahy Scale: Fair Standing balance comment: able to ambulate without UE support, generally unsteady                             Pertinent Vitals/Pain Pain Assessment: No/denies pain    Home Living Family/patient expects to be discharged to:: Private residence Living Arrangements: Alone Available Help at Discharge: Friend(s);Available PRN/intermittently Type of Home: House Home Access: Stairs to enter Entrance Stairs-Rails: None Entrance Stairs-Number of Steps: 3   Home Equipment: Cane - single point;Walker - 2 wheels      Prior Function Level of Independence: Independent         Comments: x1  fall few days PTA reports in the bathroom. 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, heating/air specialist     Hand Dominance   Dominant Hand: Right    Extremity/Trunk Assessment   Upper Extremity Assessment Upper  Extremity Assessment: Defer to OT evaluation    Lower Extremity Assessment Lower Extremity Assessment: Generalized weakness(at least 3/5 full AROM hip and knee flexion/ext, DF/PF, hip abd/add assessed in supine; unable to perform formal MMT (direction following for this difficult because pt so HOH))    Cervical / Trunk Assessment Cervical / Trunk Assessment: Normal  Communication   Communication: HOH  Cognition Arousal/Alertness: Awake/alert Behavior During Therapy: WFL for tasks assessed/performed Overall Cognitive Status: Difficult to assess                                 General Comments: pt very HOH so often required repetition, overall appears [facility] for basic tasks but will continue to assess in functional context       General Comments General comments (skin integrity, edema, etc.): HR 80s-121 bpm, in afib rhythm. PT and OT received permission for mobility, per RN Margaretha Sheffield and Dr. Erlinda Hong pt is appropriate for mobility at time of eval    Exercises     Assessment/Plan    PT Assessment Patient needs continued PT services  PT Problem List Decreased strength;Decreased mobility;Decreased safety awareness;Decreased activity tolerance;Decreased balance;Decreased knowledge of use of DME;Cardiopulmonary status limiting activity       PT Treatment Interventions DME instruction;Therapeutic activities;Patient/family education;Therapeutic exercise;Gait training;Stair training;Balance training;Functional mobility training    PT Goals (Current goals can be found in the Care Plan section)  Acute Rehab PT Goals Patient Stated Goal: home when able PT Goal Formulation: With patient Time For Goal Achievement: 08/30/19 Potential to Achieve Goals: Good    Frequency Min 4X/week   Barriers to discharge Decreased caregiver support pt's daughter from [place] may be coming to stay with him post-acutely    Co-evaluation PT/OT/SLP Co-Evaluation/Treatment: Yes Reason for Co-Treatment:  For patient/therapist safety PT goals addressed during session: Mobility/safety with mobility         AM-PAC PT "6 Clicks" Mobility  Outcome Measure Help needed turning from your back to your side while in a flat bed without using bedrails?: A Little Help needed moving from lying on your back to sitting on the side of a flat bed without using bedrails?: A Little Help needed moving to and from a bed to a chair (including a wheelchair)?: A Little Help needed standing up from a chair using your arms (e.g., wheelchair or bedside chair)?: A Little Help needed to walk in hospital room?: A Little Help needed climbing 3-5 steps with a railing? : A Lot 6 Click Score: 17    End of Session Equipment Utilized During Treatment: Gait belt Activity Tolerance: Patient tolerated treatment well Patient left: in chair;with chair alarm set;with call bell/phone within reach Nurse Communication: Mobility status PT Visit Diagnosis: Unsteadiness on feet (R26.81);Muscle weakness (generalized) (M62.81)    Time: [id number] PT Time Calculation (min) (ACUTE ONLY): 31 min   Charges:   PT Evaluation $PT Eval Low Complexity: 1 Low         Jaydalynn Olivero E, PT Acute Rehabilitation Services Pager [phone]  Office [phone]  Taylore Hinde D Elonda Husky 08/16/2019, 3:31 PM

## 2019-08-16 NOTE — Progress Notes (Signed)
RN notified earlier within shift by microbiology that patient was positive for MRSA, orders for standing procedures and ointment put in.   Upon report on patient, RN and day shift RN noticed the patient had the positive Covid-19 tab within chart.   Microbiology mistakenly said MRSA instead of Covid-19. Microbiology notified as well as facilities to turn on negative pressure room. Pt is already in a negative pressure room.   Agricultural consultant and AD notified   Pt did have Covid-19 vaccine yesterday, called infectious medicine to determine if this could show a false positive result and they said it would not.

## 2019-08-17 DIAGNOSIS — N1832 Chronic kidney disease, stage 3b: Secondary | ICD-10-CM | POA: Diagnosis present

## 2019-08-17 DIAGNOSIS — U071 COVID-19: Secondary | ICD-10-CM | POA: Diagnosis present

## 2019-08-17 DIAGNOSIS — I4891 Unspecified atrial fibrillation: Secondary | ICD-10-CM | POA: Diagnosis present

## 2019-08-17 DIAGNOSIS — N1831 Chronic kidney disease, stage 3a: Secondary | ICD-10-CM | POA: Diagnosis present

## 2019-08-17 DIAGNOSIS — E785 Hyperlipidemia, unspecified: Secondary | ICD-10-CM | POA: Diagnosis present

## 2019-08-17 LAB — CBC
HCT: 43.4 % (ref 39.0–52.0)
Hemoglobin: 14.5 g/dL (ref 13.0–17.0)
MCH: 30.5 pg (ref 26.0–34.0)
MCHC: 33.4 g/dL (ref 30.0–36.0)
MCV: 91.2 fL (ref 80.0–100.0)
Platelets: 279 10*3/uL (ref 150–400)
RBC: 4.76 MIL/uL (ref 4.22–5.81)
RDW: 13.2 % (ref 11.5–15.5)
WBC: 4.2 10*3/uL (ref 4.0–10.5)
nRBC: 0 % (ref 0.0–0.2)

## 2019-08-17 LAB — GLUCOSE, CAPILLARY: Glucose-Capillary: 94 mg/dL (ref 70–99)

## 2019-08-17 LAB — BASIC METABOLIC PANEL
Anion gap: 12 (ref 5–15)
BUN: 21 mg/dL (ref 8–23)
CO2: 20 mmol/L — ABNORMAL LOW (ref 22–32)
Calcium: 8.5 mg/dL — ABNORMAL LOW (ref 8.9–10.3)
Chloride: 103 mmol/L (ref 98–111)
Creatinine, Ser: 1.52 mg/dL — ABNORMAL HIGH (ref 0.61–1.24)
GFR calc Af Amer: 47 mL/min — ABNORMAL LOW (ref >60)
GFR calc non Af Amer: 41 mL/min — ABNORMAL LOW (ref >60)
Glucose, Bld: 118 mg/dL — ABNORMAL HIGH (ref 70–99)
Potassium: 4.6 mmol/L (ref 3.5–5.1)
Sodium: 135 mmol/L (ref 135–145)

## 2019-08-17 MED ORDER — APIXABAN 2.5 MG PO TABS: 2.5000 mg | ORAL_TABLET | Freq: Two times a day (BID) | ORAL | 2 refills | Status: DC

## 2019-08-17 MED ORDER — ATORVASTATIN CALCIUM 40 MG PO TABS: 40.0000 mg | ORAL_TABLET | Freq: Every day | ORAL | 2 refills | Status: DC

## 2019-08-17 MED ORDER — APIXABAN 2.5 MG PO TABS
2.5000 mg | ORAL_TABLET | Freq: Two times a day (BID) | ORAL | Status: DC
  Administered 2019-08-17: 2.5 mg via ORAL
  Filled 2019-08-17: qty 1

## 2019-08-17 NOTE — Progress Notes (Signed)
Received call from central tele that patient had 2.6 second compensatory pause after a PVC. Also in bigeminy. CAlled to Dr. Leonie Man to make him aware, will continue to monitor.

## 2019-08-17 NOTE — Progress Notes (Signed)
Educated patient on Signs and Symptoms of stroke, When Call 911, Individualized Risk Factors, Importance of Follow-up visits and managing risk factors, Educated on Apixaban and need to take it twice every day. Pt verbalized understanding.

## 2019-08-17 NOTE — Discharge Summary (Addendum)
Stroke Discharge Summary  Patient ID: Dillon Smith   MRN: [id number]      DOB: 11/29/1929  Date of Admission: 08/15/2019 Date of Discharge: 08/17/2019  Attending Physician:  Garvin Fila, MD, Stroke MD Consultant(s):    None  Patient's PCP:  [facility]  DISCHARGE DIAGNOSIS:  Principal Problem:   Acute ischemic stroke ([place]) mult R MCA and PCA embolic infarcts d/t AF not on [facility] s/p tPA Active Problems:   Essential hypertension   Coronary atherosclerosis   COVID-19 virus detected   Atrial fibrillation (HCC)   Hyperlipidemia   Chronic kidney disease (CKD) stage G3b/A2, moderately decreased glomerular filtration rate (GFR) between 30-44 mL/min/1.73 square meter and albuminuria creatinine ratio between 30-299 mg/g   Allergies as of 08/17/2019   No Known Allergies      Medication List     STOP taking these medications    aspirin EC 81 MG tablet       TAKE these medications    amLODipine 10 MG tablet Commonly known as: NORVASC Take 10 mg by mouth daily.   apixaban 2.5 MG Tabs tablet Commonly known as: ELIQUIS Take 1 tablet (2.5 mg total) by mouth 2 (two) times daily.   atorvastatin 40 MG tablet Commonly known as: LIPITOR Take 1 tablet (40 mg total) by mouth at bedtime. What changed:  medication strength how much to take   metoprolol tartrate 50 MG tablet Commonly known as: LOPRESSOR Take 50 mg by mouth 2 (two) times daily.        LABORATORY STUDIES CBC    Component Value Date/Time   WBC 4.2 08/17/2019 0457   RBC 4.76 08/17/2019 0457   HGB 14.5 08/17/2019 0457   HCT 43.4 08/17/2019 0457   PLT 279 08/17/2019 0457   MCV 91.2 08/17/2019 0457   MCH 30.5 08/17/2019 0457   MCHC 33.4 08/17/2019 0457   RDW 13.2 08/17/2019 0457   LYMPHSABS 1.0 08/15/2019 1942   MONOABS 0.3 08/15/2019 1942   EOSABS 0.0 08/15/2019 1942   BASOSABS 0.0 08/15/2019 1942   CMP    Component Value Date/Time   NA 135 08/17/2019 0457   K 4.6 08/17/2019 0457   CL  103 08/17/2019 0457   CO2 20 (L) 08/17/2019 0457   GLUCOSE 118 (H) 08/17/2019 0457   BUN 21 08/17/2019 0457   CREATININE 1.52 (H) 08/17/2019 0457   CALCIUM 8.5 (L) 08/17/2019 0457   PROT 6.5 08/15/2019 1942   ALBUMIN 2.3 (L) 08/15/2019 1942   AST 48 (H) 08/15/2019 1942   ALT 41 08/15/2019 1942   ALKPHOS 53 08/15/2019 1942   BILITOT 0.7 08/15/2019 1942   GFRNONAA 41 (L) 08/17/2019 0457   GFRAA 47 (L) 08/17/2019 0457   COAGS Lab Results  Component Value Date   INR 1.1 08/15/2019   Lipid Panel    Component Value Date/Time   CHOL 146 08/16/2019 0242   TRIG 109 08/16/2019 0242   HDL 23 (L) 08/16/2019 0242   CHOLHDL 6.3 08/16/2019 0242   VLDL 22 08/16/2019 0242   LDLCALC 101 (H) 08/16/2019 0242   HgbA1C  Lab Results  Component Value Date   HGBA1C 6.3 (H) 08/16/2019    SIGNIFICANT DIAGNOSTIC STUDIES CT Code Stroke CTA Head W/WO contrast  Result Date: 08/15/2019 CLINICAL DATA:  Right-sided gaze disturbance. Slurred speech. Left-sided weakness. EXAM: CT ANGIOGRAPHY HEAD AND NECK CT PERFUSION BRAIN TECHNIQUE: Multidetector CT imaging of the head and neck was performed using the standard protocol during  bolus administration of intravenous contrast. Multiplanar CT image reconstructions and MIPs were obtained to evaluate the vascular anatomy. Carotid stenosis measurements (when applicable) are obtained utilizing NASCET criteria, using the distal internal carotid diameter as the denominator. Multiphase CT imaging of the brain was performed following IV bolus contrast injection. Subsequent parametric perfusion maps were calculated using RAPID software. CONTRAST:  168mL OMNIPAQUE IOHEXOL 350 MG/ML SOLN COMPARISON:  Head CT earlier same day. FINDINGS: CTA NECK FINDINGS Aortic arch: Aortic atherosclerosis. Branching pattern is normal without origin stenosis. Right carotid system: Common carotid artery widely patent to the bifurcation. Soft and calcified plaque at the carotid bifurcation and ICA  bulb. Minimal diameter of the ICA bulb is 3.7 mm. Compared to a more distal cervical ICA diameter of 4.3 mm, this indicates a 15% stenosis. Left carotid system: Common carotid artery widely patent to the bifurcation. Calcified plaque at the carotid bifurcation and ICA bulb. Minimal diameter is 4.5 mm, the same as the more distal cervical ICA. Therefore there is no stenosis. The cervical ICA is markedly tortuous. Vertebral arteries: There is calcified plaque at both vertebral artery origins. Severe stenosis on the right, 80% or greater. Moderate stenosis on the left, 50-75%. Both vertebral arteries do show flow to the foramen magnum. Skeleton: Ordinary spondylosis. Other neck: Soft tissue lipoma of the anterior midline neck. Upper chest: Mild peripheral scarring on the right. Review of the MIP images confirms the above findings CTA HEAD FINDINGS Anterior circulation: Both internal carotid arteries are patent through the skull base and siphon regions. There is siphon atherosclerotic calcification but without stenosis greater than 30%. No large or medium vessel occlusion identified on the left. There is a single azygos anterior cerebral artery supplying both anterior cerebral artery territories. On the right, there is some atherosclerotic irregularity of the M1 segment but no flow limiting stenosis. There is an occluded M3 branch serving the frontal operculum. Posterior circulation: Both vertebral arteries are patent through the foramen magnum to the basilar. Before atherosclerotic disease but without stenosis greater than 30%. 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 Superior cerebellar and posterior cerebral arteries show flow. Venous sinuses: Patent and normal. Anatomic variants: None significant. Review of the MIP images confirms the above findings CT Brain Perfusion Findings: ASPECTS: 10 Perfusion data is not reliable. In  looking at the source data, there does appear to be a small region of diminished cerebral blood volume in the right frontal operculum, estimated at about 15 cc. There is also diminished cerebral blood flow and delayed mean transit time in that region. 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 Perfusion data is unreliable because of motion. Viewing the source images, the patient does demonstrate a region of diminished cerebral blood volume, cerebral blood flow and prolonged mean transit time in the frontal operculum ir region, affecting about 15-20 cc of brain. Atherosclerotic disease at the aortic arch. Atherosclerotic disease at both carotid bifurcations. 15% ICA stenosis on the right. No stenosis on the left. Atherosclerotic disease in the carotid siphon regions but without stenosis greater than 30%. Stenosis of both vertebral artery origins, severe on the right and moderate on the left. 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 Findings discussed with Dr. Rory Percy during performance. Electronically Signed   By: Nelson Chimes M.D.   On: 08/15/2019 20:56   CT HEAD WO CONTRAST  Result Date: 08/16/2019 CLINICAL DATA:  86 year old male  24 hours status post tPA for code stroke presentation. Right M3 branch occlusion suspected on CTA yesterday. EXAM: CT HEAD WITHOUT CONTRAST TECHNIQUE: Contiguous axial images were obtained from the base of the skull through the vertex without intravenous contrast. COMPARISON:  CTA head and neck 08/15/2019 and earlier. FINDINGS: Brain: No intracranial mass effect. No acute intracranial hemorrhage identified. No definite acute or evolving cortical infarct identified in the right hemisphere; hypodense area on series 3, image 20 appears to be a sulcus. And patchy bilateral white matter hypodensity has not definitely changed. Stable gray-white matter differentiation elsewhere. Normal basilar cisterns. No midline shift. Stable ventricle size and  configuration. Vascular: Calcified atherosclerosis at the skull base. No suspicious intracranial vascular hyperdensity. Skull: No acute osseous abnormality identified. Sinuses/Orbits: Visualized paranasal sinuses and mastoids are stable and well pneumatized. Other: Resolved gaze deviation. No acute orbit or scalp soft tissue finding. IMPRESSION: 1. No acute or evolving cerebral infarct identified by CT compared to yesterday. 2. No intracranial hemorrhage or mass effect. Electronically Signed   By: Genevie Ann M.D.   On: 08/16/2019 18:23   CT Code Stroke CTA Neck W/WO contrast  Result Date: 08/15/2019 CLINICAL DATA:  Right-sided gaze disturbance. Slurred speech. Left-sided weakness. EXAM: CT ANGIOGRAPHY HEAD AND NECK CT PERFUSION BRAIN TECHNIQUE: Multidetector CT imaging of the head and neck was performed using the standard protocol during bolus administration of intravenous contrast. Multiplanar CT image reconstructions and MIPs were obtained to evaluate the vascular anatomy. Carotid stenosis measurements (when applicable) are obtained utilizing NASCET criteria, using the distal internal carotid diameter as the denominator. Multiphase CT imaging of the brain was performed following IV bolus contrast injection. Subsequent parametric perfusion maps were calculated using RAPID software. CONTRAST:  158mL OMNIPAQUE IOHEXOL 350 MG/ML SOLN COMPARISON:  Head CT earlier same day. FINDINGS: CTA NECK FINDINGS Aortic arch: Aortic atherosclerosis. Branching pattern is normal without origin stenosis. Right carotid system: Common carotid artery widely patent to the bifurcation. Soft and calcified plaque at the carotid bifurcation and ICA bulb. Minimal diameter of the ICA bulb is 3.7 mm. Compared to a more distal cervical ICA diameter of 4.3 mm, this indicates a 15% stenosis. Left carotid system: Common carotid artery widely patent to the bifurcation. Calcified plaque at the carotid bifurcation and ICA bulb. Minimal diameter is 4.5  mm, the same as the more distal cervical ICA. Therefore there is no stenosis. The cervical ICA is markedly tortuous. Vertebral arteries: There is calcified plaque at both vertebral artery origins. Severe stenosis on the right, 80% or greater. Moderate stenosis on the left, 50-75%. Both vertebral arteries do show flow to the foramen magnum. Skeleton: Ordinary spondylosis. Other neck: Soft tissue lipoma of the anterior midline neck. Upper chest: Mild peripheral scarring on the right. Review of the MIP images confirms the above findings CTA HEAD FINDINGS Anterior circulation: Both internal carotid arteries are patent through the skull base and siphon regions. There is siphon atherosclerotic calcification but without stenosis greater than 30%. No large or medium vessel occlusion identified on the left. There is a single azygos anterior cerebral artery supplying both anterior cerebral artery territories. On the right, there is some atherosclerotic irregularity of the M1 segment but no flow limiting stenosis. There is an occluded M3 branch serving the frontal operculum. Posterior circulation: Both vertebral arteries are patent through the foramen magnum to the basilar. Before atherosclerotic disease but without stenosis greater than 30%. Severe stenosis of the midportion of the basilar artery, 80% or greater, would place the patient  at risk for distal basilar occlusion. Superior cerebellar and posterior cerebral arteries show flow. Venous sinuses: Patent and normal. Anatomic variants: None significant. Review of the MIP images confirms the above findings CT Brain Perfusion Findings: ASPECTS: 10 Perfusion data is not reliable. In looking at the source data, there does appear to be a small region of diminished cerebral blood volume in the right frontal operculum, estimated at about 15 cc. There is also diminished cerebral blood flow and delayed mean transit time in that region. IMPRESSION: Right M3 branch vessel occlusion  serving the right frontal operculum. Perfusion data is unreliable because of motion. Viewing the source images, the patient does demonstrate a region of diminished cerebral blood volume, cerebral blood flow and prolonged mean transit time in the frontal operculum ir region, affecting about 15-20 cc of brain. Atherosclerotic disease at the aortic arch. Atherosclerotic disease at both carotid bifurcations. 15% ICA stenosis on the right. No stenosis on the left. Atherosclerotic disease in the carotid siphon regions but without stenosis greater than 30%. Stenosis of both vertebral artery origins, severe on the right and moderate on the left. 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 Findings discussed with Dr. Rory Percy during performance. Electronically Signed   By: Nelson Chimes M.D.   On: 08/15/2019 20:56   MR ANGIO HEAD WO CONTRAST  Result Date: 08/16/2019 CLINICAL DATA:  Stroke follow-up. Status post tPA. EXAM: MRI HEAD WITHOUT CONTRAST MRA HEAD WITHOUT CONTRAST TECHNIQUE: Multiplanar, multiecho pulse sequences of the brain and surrounding structures were obtained without intravenous contrast. Angiographic images of the head were obtained using MRA technique without contrast. COMPARISON:  None. FINDINGS: MRI HEAD FINDINGS Brain: Multifocal abnormal diffusion restriction within the right hemisphere, predominantly within the right frontal lobe and right insula. Small area diffusion restriction in the right occipital lobe. No contralateral diffusion abnormality. No acute hemorrhage. Early confluent hyperintense T2-weighted signal of the periventricular and deep white matter, most commonly due to chronic ischemic microangiopathy. Normal volume of CSF spaces. No chronic microhemorrhage. Normal midline structures. Vascular: Normal flow voids. Skull and upper cervical spine: Normal marrow signal. Sinuses/Orbits: Negative. Other: None. MRA HEAD FINDINGS POSTERIOR CIRCULATION: --Vertebral arteries: Normal V4 segments.  --Posterior inferior cerebellar arteries (PICA): Patent origins from the vertebral arteries. --Anterior inferior cerebellar arteries (AICA): Patent origins from the basilar artery. --Basilar artery: Multifocal moderate narrowing of the basilar artery may be exaggerated by motion. --Superior cerebellar arteries: Normal. --Posterior cerebral arteries: Normal. Both originate from the basilar artery. Posterior communicating arteries (p-comm) are diminutive or absent. ANTERIOR CIRCULATION: --Intracranial internal carotid arteries: Normal. --Anterior cerebral arteries (ACA): Azygos configuration. Both A1 segments are present. --Middle cerebral arteries (MCA): Apparent narrowing of the inferior division left M2 segment is likely due to motion. Otherwise normal. IMPRESSION: 1. 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 No hemorrhage or mass effect. 2. No emergent large vessel occlusion. 3. Multifocal moderate narrowing of the basilar artery is probably exaggerated by motion artifact. 4. Findings of chronic ischemic microangiopathy. Electronically Signed   By: Ulyses Jarred M.D.   On: 08/16/2019 21:46   MR BRAIN WO CONTRAST  Result Date: 08/16/2019 CLINICAL DATA:  Stroke follow-up. Status post tPA. EXAM: MRI HEAD WITHOUT CONTRAST MRA HEAD WITHOUT CONTRAST TECHNIQUE: Multiplanar, multiecho pulse sequences of the brain and surrounding structures were obtained without intravenous contrast. Angiographic images of the head were obtained using MRA technique without contrast. COMPARISON:  None. FINDINGS: MRI HEAD FINDINGS Brain: Multifocal  abnormal diffusion restriction within the right hemisphere, predominantly within the right frontal lobe and right insula. Small area diffusion restriction in the right occipital lobe. No contralateral diffusion abnormality. No acute hemorrhage. Early confluent hyperintense T2-weighted signal of the  periventricular and deep white matter, most commonly due to chronic ischemic microangiopathy. Normal volume of CSF spaces. No chronic microhemorrhage. Normal midline structures. Vascular: Normal flow voids. Skull and upper cervical spine: Normal marrow signal. Sinuses/Orbits: Negative. Other: None. MRA HEAD FINDINGS POSTERIOR CIRCULATION: --Vertebral arteries: Normal V4 segments. --Posterior inferior cerebellar arteries (PICA): Patent origins from the vertebral arteries. --Anterior inferior cerebellar arteries (AICA): Patent origins from the basilar artery. --Basilar artery: Multifocal moderate narrowing of the basilar artery may be exaggerated by motion. --Superior cerebellar arteries: Normal. --Posterior cerebral arteries: Normal. Both originate from the basilar artery. Posterior communicating arteries (p-comm) are diminutive or absent. ANTERIOR CIRCULATION: --Intracranial internal carotid arteries: Normal. --Anterior cerebral arteries (ACA): Azygos configuration. Both A1 segments are present. --Middle cerebral arteries (MCA): Apparent narrowing of the inferior division left M2 segment is likely due to motion. Otherwise normal. IMPRESSION: 1. 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 No hemorrhage or mass effect. 2. No emergent large vessel occlusion. 3. Multifocal moderate narrowing of the basilar artery is probably exaggerated by motion artifact. 4. Findings of chronic ischemic microangiopathy. Electronically Signed   By: Ulyses Jarred M.D.   On: 08/16/2019 21:46   CT Code Stroke Cerebral Perfusion with contrast  Result Date: 08/15/2019 CLINICAL DATA:  Right-sided gaze disturbance. Slurred speech. Left-sided weakness. EXAM: CT ANGIOGRAPHY HEAD AND NECK CT PERFUSION BRAIN TECHNIQUE: Multidetector CT imaging of the head and neck was performed using the standard protocol during bolus administration of intravenous contrast.  Multiplanar CT image reconstructions and MIPs were obtained to evaluate the vascular anatomy. Carotid stenosis measurements (when applicable) are obtained utilizing NASCET criteria, using the distal internal carotid diameter as the denominator. Multiphase CT imaging of the brain was performed following IV bolus contrast injection. Subsequent parametric perfusion maps were calculated using RAPID software. CONTRAST:  175mL OMNIPAQUE IOHEXOL 350 MG/ML SOLN COMPARISON:  Head CT earlier same day. FINDINGS: CTA NECK FINDINGS Aortic arch: Aortic atherosclerosis. Branching pattern is normal without origin stenosis. Right carotid system: Common carotid artery widely patent to the bifurcation. Soft and calcified plaque at the carotid bifurcation and ICA bulb. Minimal diameter of the ICA bulb is 3.7 mm. Compared to a more distal cervical ICA diameter of 4.3 mm, this indicates a 15% stenosis. Left carotid system: Common carotid artery widely patent to the bifurcation. Calcified plaque at the carotid bifurcation and ICA bulb. Minimal diameter is 4.5 mm, the same as the more distal cervical ICA. Therefore there is no stenosis. The cervical ICA is markedly tortuous. Vertebral arteries: There is calcified plaque at both vertebral artery origins. Severe stenosis on the right, 80% or greater. Moderate stenosis on the left, 50-75%. Both vertebral arteries do show flow to the foramen magnum. Skeleton: Ordinary spondylosis. Other neck: Soft tissue lipoma of the anterior midline neck. Upper chest: Mild peripheral scarring on the right. Review of the MIP images confirms the above findings CTA HEAD FINDINGS Anterior circulation: Both internal carotid arteries are patent through the skull base and siphon regions. There is siphon atherosclerotic calcification but without stenosis greater than 30%. No large or medium vessel occlusion identified on the left. There is a single azygos anterior cerebral artery supplying both anterior cerebral  artery territories. On  the right, there is some atherosclerotic irregularity of the M1 segment but no flow limiting stenosis. There is an occluded M3 branch serving the frontal operculum. Posterior circulation: Both vertebral arteries are patent through the foramen magnum to the basilar. Before atherosclerotic disease but without stenosis greater than 30%. 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 Superior cerebellar and posterior cerebral arteries show flow. Venous sinuses: Patent and normal. Anatomic variants: None significant. Review of the MIP images confirms the above findings CT Brain Perfusion Findings: ASPECTS: 10 Perfusion data is not reliable. In looking at the source data, there does appear to be a small region of diminished cerebral blood volume in the right frontal operculum, estimated at about 15 cc. There is also diminished cerebral blood flow and delayed mean transit time in that region. 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 Perfusion data is unreliable because of motion. Viewing the source images, the patient does demonstrate a region of diminished cerebral blood volume, cerebral blood flow and prolonged mean transit time in the frontal operculum ir region, affecting about 15-20 cc of brain. Atherosclerotic disease at the aortic arch. Atherosclerotic disease at both carotid bifurcations. 15% ICA stenosis on the right. No stenosis on the left. Atherosclerotic disease in the carotid siphon regions but without stenosis greater than 30%. Stenosis of both vertebral artery origins, severe on the right and moderate on the left. 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 Findings discussed with Dr. Rory Percy during performance. Electronically Signed   By: Nelson Chimes M.D.   On: 08/15/2019 20:56   ECHOCARDIOGRAM COMPLETE  Result Date: 08/16/2019    ECHOCARDIOGRAM  REPORT   Patient Name:   Dillon Smith Date of Exam: 08/16/2019 Medical Rec #:  [id number]        Height:       70.0 in Accession #:    [id number]       Weight:       206.3 lb Date of Birth:  [birth year]        BSA:          2.12 m Patient Age:    48 years         BP:           113/96 mmHg Patient Gender: M                HR:           77 bpm. Exam Location:  Inpatient Procedure: 2D Echo, Cardiac Doppler and Color Doppler Indications:    Stroke 434.91 / I163.9  History:        Patient has no prior history of Echocardiogram examinations.                 CAD, Arrythmias:Atrial Fibrillation; Risk Factors:Hypertension.                 COVID-19 Positive.  Sonographer:    Jonelle Sidle Dance Referring Phys: [id number] ASHISH ARORA IMPRESSIONS  1. Left ventricular ejection fraction, by estimation, is 45 to 50%. The left ventricle has mildly decreased function. The left ventricle demonstrates global hypokinesis. The left ventricular internal cavity size was mildly dilated. Left ventricular diastolic function could not be evaluated.  2. Right ventricular systolic function is mildly reduced. The right ventricular size is normal. There is normal pulmonary artery systolic pressure. The estimated right ventricular systolic pressure is 123456 mmHg.  3.  Left atrial size was severely dilated.  4. The mitral valve is degenerative. Mild to moderate mitral valve regurgitation.  5. The aortic valve is tricuspid. Aortic valve regurgitation is moderate. No aortic stenosis is present.  6. The inferior vena cava is normal in size with greater than 50% respiratory variability, suggesting right atrial pressure of 3 mmHg. Comparison(s): No prior Echocardiogram. FINDINGS  Left Ventricle: Left ventricular ejection fraction, by estimation, is 45 to 50%. The left ventricle has mildly decreased function. The left ventricle demonstrates global hypokinesis. The left ventricular internal cavity size was mildly dilated. There is  no left ventricular hypertrophy.  The left ventricular diastology could not be evaluated due to atrial fibrillation. Left ventricular diastolic function could not be evaluated. Right Ventricle: The right ventricular size is normal. No increase in right ventricular wall thickness. Right ventricular systolic function is mildly reduced. There is normal pulmonary artery systolic pressure. The tricuspid regurgitant velocity is 2.64 m/s, and with an assumed right atrial pressure of 3 mmHg, the estimated right ventricular systolic pressure is 123456 mmHg. Left Atrium: Left atrial size was severely dilated. Right Atrium: Right atrial size was normal in size. Pericardium: There is no evidence of pericardial effusion. Mitral Valve: The mitral valve is degenerative in appearance. Mild mitral annular calcification. Mild to moderate mitral valve regurgitation. Tricuspid Valve: The tricuspid valve is grossly normal. Tricuspid valve regurgitation is mild. Aortic Valve: The aortic valve is tricuspid. Aortic valve regurgitation is moderate. Aortic regurgitation PHT measures 771 msec. No aortic stenosis is present. Pulmonic Valve: The pulmonic valve was grossly normal. Pulmonic valve regurgitation is trivial. Aorta: The aortic root and ascending aorta are structurally normal, with no evidence of dilitation. Venous: The inferior vena cava is normal in size with greater than 50% respiratory variability, suggesting right atrial pressure of 3 mmHg. IAS/Shunts: No atrial level shunt detected by color flow Doppler.  LEFT VENTRICLE PLAX 2D LVIDd:         6.20 cm LVIDs:         4.47 cm LV PW:         1.20 cm LV IVS:        0.89 cm LVOT diam:     2.30 cm LV SV:         55.81 ml LV SV Index:   47.40 LVOT Area:     4.15 cm  RIGHT VENTRICLE          IVC RV Basal diam:  3.33 cm  IVC diam: 1.60 cm RV Mid diam:    2.36 cm TAPSE (M-mode): 1.1 cm LEFT ATRIUM              Index       RIGHT ATRIUM           Index LA diam:        4.90 cm  2.32 cm/m  RA Area:     21.80 cm LA Vol (A2C):    147.0 ml 69.50 ml/m RA Volume:   72.20 ml  34.13 ml/m LA Vol (A4C):   90.3 ml  42.69 ml/m LA Biplane Vol: 116.0 ml 54.84 ml/m  AORTIC VALVE LVOT Vmax:   83.00 cm/s LVOT Vmean:  50.267 cm/s LVOT VTI:    0.134 m AI PHT:      771 msec  AORTA Ao Root diam: 3.90 cm Ao Asc diam:  3.90 cm MITRAL VALVE               TRICUSPID VALVE MV Area (PHT):  3.34 cm    TR Peak grad:   27.9 mmHg MV Decel Time: 227 msec    TR Vmax:        264.00 cm/s MV E velocity: 80.30 cm/s                            SHUNTS                            Systemic VTI:  0.13 m                            Systemic Diam: 2.30 cm Eleonore Chiquito MD Electronically signed by Eleonore Chiquito MD Signature Date/Time: 08/16/2019/4:00:11 PM    Final    CT HEAD CODE STROKE WO CONTRAST  Result Date: 08/15/2019 CLINICAL DATA:  Code stroke. Right-sided gaze disturbance. Slurred speech. Left-sided weakness. EXAM: CT HEAD WITHOUT CONTRAST TECHNIQUE: Contiguous axial images were obtained from the base of the skull through the vertex without intravenous contrast. COMPARISON:  None. FINDINGS: Brain: Age related generalized volume loss. Chronic small-vessel ischemic changes affect the cerebral hemispheric white matter. Focal low-density in the external capsule regions bilaterally consistent chronic small vessel change. No sign of acute cortical infarction, mass lesion, hemorrhage, hydrocephalus or extra-axial collection. Vascular: There is atherosclerotic calcification of the major vessels at the base of the brain. Skull: Negative Sinuses/Orbits: Clear/normal Other: None ASPECTS ([place] Stroke Program Early CT Score) - Ganglionic level infarction (caudate, lentiform nuclei, internal capsule, insula, M1-M3 cortex): 7 - Supraganglionic infarction (M4-M6 cortex): 3 Total score (0-10 with 10 being normal): 10 IMPRESSION: 1. No acute finding by CT. Age related atrophy. Chronic small-vessel ischemic changes throughout the brain as above. 2. ASPECTS is 10. 3. These results were  communicated to Dr. Rory Percy at 7:53 pmon 2/14/2021by text page via the [facility] messaging system. Electronically Signed   By: Nelson Chimes M.D.   On: 08/15/2019 19:54      HISTORY OF PRESENT ILLNESS Dillon Smith is a 86 y.o. male veteran of the [place] with a history of atrial fibrillation not on anticoagulation, coronary artery disease and hypertension who gets his medical care at the [facility], presented via EMS when a friend noted that the patient was slurring his words, had a left facial droop and weak on the left side and had a gaze preference to the right side.  He was seen last normal at 4:30 PM 08/15/2019 by a family friend and noted later around 6 PM with the symptoms described above. He was brought in by EMS as a code stroke. VAN positive for LVO. Stat CT head with no evidence of bleed.  IV TPA started. Case discussed with endovascular and IR team activated. There was a significant amount of delay in obtaining vessel imaging due to inability to obtain proper access.  After obtaining vessel imaging, there was no emergent proximal large vessel occlusion.  CT perfusion study did not yield an interpretable result. Interventional code was canceled at that time. No chest pain shortness of breath nausea vomiting.  No tingling numbness.  No headache. Received first shot of his COVID-19 vaccination yesterday. Premorbid modified Rankin scale (mRS): 2 - Sometimes uses a cane very rarely a walker for balance if needed but is ambulatory without support otherwise. Admitted to the neuro ICU.  HOSPITAL COURSE Dillon Smith is a 86  y.o. male with history of AF not on AC, CAD, HTN presenting with dysarthria, L facial droop, L hemiparesis and R gaze preference. Received tPA 08/15/2019 at [facility].   Stroke: multiple R MCA and PCA infarcts likely embolic secondary to AF not on AC CT head No acute abnormality. ASPECTS 10.    CTA head & neck R M3 occlusion. B ICA bifurcation atherosclerosis w/ R ICA 15%  stenosis. ICA siphon atherosclerosis but w/o > 15% stenosis. 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 BA >80% stenosis.  Aortic atherosclerosis.  CT perfusion diminished CBV, CBF and MTT in frontal operculum MRI multifocal R MCA and PCA infarcts. Small vessel disease.  MRA no ELVO. Moderate narrowing BA exaggerated by artifact.  2D Echo EF 45-50%. No source of embolus  LDL 101 HgbA1c 6.3 No antithrombotic prior to admission, now on aspirin 325 mg daily. Given AF, will change to Eliquis.    Therapy recommendations:  HH PT, HH OT Disposition:  return home   COVID-19 Infection Tested positive on arrival to hospital 2/14 Asymptomatic  No need to treat at this time   Atrial Fibrillation Home anticoagulation: ASA 81 Not sure why he is not on [facility], pt or her daughter can not tell me No obvious contraindication for [facility] Start eliquis for secondary stroke prevention.   Hypertension Home meds:  norvasc 10 metoprolol 50 bid  BP Stable Resume home meds at d/c BP goal normotensive   Hyperlipidemia Home meds:  lipitor 20  On lipitor 40 LDL 101, goal < 70 Continue statin at discharge   Dysphagia, resolved Secondary to stroke On diet now   Other Stroke Risk Factors Advanced age Former cigarette smoker Overweight, recommend weight loss, diet and exercise as appropriate  Coronary artery disease    Other Active Problems CKD IIIb, Cre 1.98-1.9-1.68-1.52  DISCHARGE EXAM Blood pressure 120/69, pulse (!) 116, temperature (!) 97.5 F (36.4 C), temperature source Oral, resp. rate 19, weight 93.6 kg, SpO2 100 %. General - Well nourished, well developed, in no apparent distress.   Ophthalmologic - fundi not visualized due to noncooperation.   Cardiovascular - irregularly irregular heart rate and rhythm.   Mental Status -  Level of arousal and orientation to month, place, and person were intact. Language including naming, repetition, comprehension was assessed and found intact, paucity of  speech. Mild dysarthria   Cranial Nerves II - XII - II - Visual field intact OU. III, IV, VI - Extraocular movements intact. V - Facial sensation intact bilaterally. VII - slight left nasolabial fold flattening. VIII - hard of hearing & vestibular intact bilaterally. X - Palate elevates symmetrically. Mild dysarthria XI - Chin turning & shoulder shrug intact bilaterally. XII - Tongue protrusion intact.   Motor Strength - The patient's strength was normal in all extremities and pronator drift was absent.  Bulk was normal and fasciculations were absent.   Motor Tone - Muscle tone was assessed at the neck and appendages and was normal.   Reflexes - The patient's reflexes were symmetrical in all extremities and he had no pathological reflexes.   Sensory - Light touch, temperature/pinprick were assessed and were symmetrical.     Coordination - The patient had normal movements in the hands with no ataxia or dysmetria.  Tremor was absent.   Gait and Station - deferred.  Discharge Diet   Heart healthy thin liquids  DISCHARGE PLAN Disposition:  Return home  Home health PT and OT Eliquis (apixaban) daily for secondary stroke prevention  Ongoing stroke risk factor control by Primary Care Physician at time of discharge Follow-up PCP [place] in 2 weeks. Follow-up in [facility] in 4 weeks, office to schedule an appointment.   46minutes were spent preparing discharge.  Burnetta Sabin, MSN, APRN, ANVP-BC, AGPCNP-BC Advanced Practice Stroke Nurse [place] for Schedule & Pager information 08/17/2019 3:20 PM   I have personally obtained history,examined this patient, reviewed notes, independently viewed imaging studies, participated in medical decision making and plan of care.ROS completed by me personally and pertinent positives fully documented  I have made any additions or clarifications directly to the above note. Agree with  note above.    Antony Contras, MD Medical Director [place] Pager: [phone] 08/17/2019 5:21 PM

## 2019-08-17 NOTE — Progress Notes (Signed)
Physical Therapy Treatment Patient Details Name: Dillon Smith MRN: [id number] DOB: 11/29/1929 Today's Date: 08/17/2019    History of Present Illness 86 y.o. male with PMHx including atrial fibrillation not on anticoagulation, CAD, and HTN who gets his medical care at the [facility], presented via EMS when a friend noted that the patient was slurring his words, had a left facial droop and weak on the left side and had a gaze preference to the right side. TPA administered as presenting with symptoms consistent with right MCA syndrome. CT brain with no acute changes.    PT Comments    Pt sitting EoB looking forward to discharging this afternoon. Pt limited in safe mobility by generalized weakness and decreased endurance. Pt requires min guard for mobility. While ambulating in hallway without AD pt had progressively decreasing balance. Pt only experienced 1x LoB which he was able to self correct. On return to room pt reports he thinks he should get the cane out of his truck when he gets home. PT agreed that until his balance is better walking with his cane would be much safer. Pt reports his brother will help him at discharge.     Follow Up Recommendations  Home health PT;Supervision for mobility/OOB     Equipment Recommendations  None recommended by PT       Precautions / Restrictions Precautions Precautions: Fall Restrictions Weight Bearing Restrictions: No    Mobility  Bed Mobility Overal bed mobility: Needs Assistance Bed Mobility: Supine to Sit     Supine to sit: Min guard     General bed mobility comments: for safety, lines and balance; no assist required  Transfers Overall transfer level: Needs assistance Equipment used: None Transfers: Sit to/from Stand Sit to Stand: Min guard         General transfer comment: min guard for safety, good power up and steadying.  Ambulation/Gait Ambulation/Gait assistance: Min guard Gait Distance (Feet): 250 Feet Assistive  device: None Gait Pattern/deviations: Step-through pattern;Decreased stride length;Trunk flexed;Drifts right/left Gait velocity: decr Gait velocity interpretation: 1.31 - 2.62 ft/sec, indicative of limited community ambulator General Gait Details: min guard for safety, for slow mildly unsteady gait, 1x LoB with scissoring step to recover balance without assist       Modified Rankin (Stroke Patients Only) Modified Rankin (Stroke Patients Only) Pre-Morbid Rankin Score: No significant disability Modified Rankin: Moderate disability     Balance Overall balance assessment: Needs assistance;History of Falls(Recent fall at home, in bathroom setting) Sitting-balance support: Feet supported Sitting balance-Leahy Scale: Good Sitting balance - Comments: able to sit EOB without support, tolerates dynamic sitting tasks without LOB   Standing balance support: No upper extremity supported;During functional activity Standing balance-Leahy Scale: Fair Standing balance comment: able to ambulate without UE support                            Cognition Arousal/Alertness: Awake/alert Behavior During Therapy: WFL for tasks assessed/performed Overall Cognitive Status: Difficult to assess                                 General Comments: pt very HOH so often required repetition, overall appears [facility] for basic tasks but will continue to assess in functional context          General Comments General comments (skin integrity, edema, etc.): VSS on RA      Pertinent Vitals/Pain Pain  Assessment: No/denies pain           PT Goals (current goals can now be found in the care plan section) Acute Rehab PT Goals Patient Stated Goal: home when able PT Goal Formulation: With patient Time For Goal Achievement: 08/30/19 Potential to Achieve Goals: Good Progress towards PT goals: Progressing toward goals    Frequency    Min 4X/week      PT Plan Current plan remains  appropriate       AM-PAC PT "6 Clicks" Mobility   Outcome Measure  Help needed turning from your back to your side while in a flat bed without using bedrails?: A Little Help needed moving from lying on your back to sitting on the side of a flat bed without using bedrails?: A Little Help needed moving to and from a bed to a chair (including a wheelchair)?: A Little Help needed standing up from a chair using your arms (e.g., wheelchair or bedside chair)?: A Little Help needed to walk in hospital room?: A Little Help needed climbing 3-5 steps with a railing? : A Lot 6 Click Score: 17    End of Session Equipment Utilized During Treatment: Gait belt Activity Tolerance: Patient tolerated treatment well Patient left: with call bell/phone within reach;with bed alarm set(sitting EoB) Nurse Communication: Mobility status PT Visit Diagnosis: Unsteadiness on feet (R26.81);Muscle weakness (generalized) (M62.81)     Time: [id number] PT Time Calculation (min) (ACUTE ONLY): 24 min  Charges:  $Gait Training: 23-37 mins                     Imogean Ciampa B. Migdalia Dk PT, DPT Acute Rehabilitation Services Pager [phone] Office [phone]    [place] 08/17/2019, 3:11 PM

## 2019-08-17 NOTE — TOC Initial Note (Addendum)
Transition of Care [facility]) - Initial/Assessment Note    Patient Details  Name: Dillon Smith MRN: [id number] Date of Birth: [birth year]  [facility] CM/SW Contact:    Maryclare Labrador, RN Phone Number: 08/17/2019, 11:29 AM  Clinical Narrative:  CM unable to complete assessment with pt via phone.  CM was able to reach his brother Delfino Lovett.  Brother informed CM that pt is active with the [facility] clinic.  CM left VM for transfer coordinator informing of admit and requesting call back in regards to HH/DME recommendations.  Pt's brother informed CM that if pt needs supervision at discharge he may be willing for pt to stay with him temporarily.  Per brother pt was independent from home alone prior to admit.     Update:  CM spoke with April the [place] transfer coordinator.  CM reached out to pts [place], [place] and [place].      Update 1330:  CM heard back from [place] with [place].  CM informed of recommendation for [facility] - orders, therapy notes and H&P will be faxed to Pts PCP at the [place] once orders are written.  VA will allow pt to choice any agency.  CM requested [place] orders from attending.  CM offered brother [email or web address] HH choice - brother does not have a preference of agency.  [facility] will accept referral pending VA authorization  Update 1600:  Pt now has discharge orders for home today. CM faxed clinicals and [place] orders to [place] as requested.   CM explained to attending service that orders have just been sent to PCP and that [facility] authorization has not yet been obtained. Pt deemed stable to discharge home today without verification of HH.   [facility] aware that pt will discharge home today and will follow up with VA tomorrow to assure Josem Kaufmann is obtained with [facility] on Thursday 08/19/19.  CM was able to speak with pt with the assistance of his hearing aide.  Pt is in agreement with [facility] for [facility] and is aware that Josem Kaufmann will be required before services can begin.  Pt is interested in  shower stool - chose [employer] - agency to deliver prior to discharge.  TOC will fill discharge prescriptions - brother can pay for Lipitor. Pt will set up follow up appt with his PCP post discharge.  NO other CM needs - CM signing off   Expected Discharge Plan: [place]     Patient Goals and CMS Choice   CMS Medicare.gov Compare Post Acute Care list provided to:: (pt is extremely HOH per bedside nurse unable to peform assessment on phone) Choice offered to / list presented to : Sibling  Expected Discharge Plan and Services Expected Discharge Plan: [place]       Living arrangements for the past 2 months: Single Family Home                                      Prior Living Arrangements/Services Living arrangements for the past 2 months: Single Family Home Lives with:: Self                   Activities of Daily Living   ADL Screening (condition at time of admission) Patient's cognitive ability adequate to safely complete daily activities?: Yes Is the patient deaf or have difficulty hearing?: Yes Does the  patient have difficulty seeing, even when wearing glasses/contacts?: No Does the patient have difficulty concentrating, remembering, or making decisions?: No Patient able to express need for assistance with ADLs?: Yes Does the patient have difficulty dressing or bathing?: No Independently performs ADLs?: Yes (appropriate for developmental age) Weakness of Legs: None Weakness of Arms/Hands: None  Permission Sought/Granted                  Emotional Assessment              Admission diagnosis:  Acute ischemic stroke [facility]) [id number] Acute embolic stroke [facility]) [id number] Patient Active Problem List   Diagnosis Date Noted  . Acute ischemic stroke ([place]) 08/15/2019  . CARCINOMA, PROSTATE 09/15/2007  . HYPERTENSION 09/15/2007  . CAD 09/15/2007  . HEMORRHOIDS, INTERNAL 09/15/2007  . RADIATION PROCTITIS 09/15/2007   PCP:   [place]:   [place], [place] [phone] [place] [address] Phone: [phone] Fax: [phone]     Social Determinants of Health (SDOH) Interventions    Readmission Risk Interventions No flowsheet data found.

## 2019-08-17 NOTE — Progress Notes (Signed)
Patient discharged today. Reviewed AVS Discharge Instructions with patient. Patient verbalized understanding. Patient given copy of AVS instructions. Removed 2 PIV with catheter tip intact. Pt escorted out in wheelchair.

## 2019-08-17 NOTE — Plan of Care (Signed)
  Problem: Health Behavior/Discharge Planning: Goal: Ability to manage health-related needs will improve Outcome: Progressing   Problem: Clinical Measurements: Goal: Will remain free from infection Outcome: Progressing   

## 2019-08-17 NOTE — Progress Notes (Signed)
ANTICOAGULATION CONSULT NOTE - Initial Consult  Pharmacy Consult for Apixaban Indication: Nonvalvular atrial fibrillation  No Known Allergies  Patient Measurements: Weight: 206 lb 5.6 oz (93.6 kg)   Vital Signs: Temp: 97.5 F (36.4 C) (02/16 1342) Temp Source: Oral (02/16 1342) BP: 120/69 (02/16 1342) Pulse Rate: 116 (02/16 1342)  Labs: Recent Labs    08/15/19 1942 08/15/19 1942 08/15/19 1948 08/15/19 1948 08/16/19 1146 08/17/19 0457  HGB 14.9   < > 15.3   < > 15.3 14.5  HCT 44.9   < > 45.0  --  46.7 43.4  PLT 277  --   --   --  285 279  APTT 37*  --   --   --   --   --   LABPROT 14.0  --   --   --   --   --   INR 1.1  --   --   --   --   --   HEPARINUNFRC <0.10*  --   --   --   --   --   CREATININE 1.98*   < > 1.90*  --  1.68* 1.52*   < > = values in this interval not displayed.    CrCl cannot be calculated (Unknown ideal weight.).   Medical History: Past Medical History:  Diagnosis Date  . A-fib ([place])   . Coronary artery disease   . Hypertension      Assessment: 86 yo male with h/o afib but not on AC PTA. Patient s/p stroke. Will now be started on [facility] with apixaban. Patient is >15 yo and Scr >1.5 therefore will need dose adjusted apixaban until Scr drops below 1.5  Goal of Therapy:  Prevention for stroke Monitor platelets by anticoagulation protocol: Yes   Plan:  Apixaban 2.5mg  PO BID Monitor for bleeding Would get [facility] consult for insurance coverage.   Shera Laubach A. Levada Dy, PharmD, BCPS, FNKF Clinical Pharmacist [place] Please utilize Amion for appropriate phone number to reach the unit pharmacist ([place])   08/17/2019,3:14 PM

## 2019-08-18 ENCOUNTER — Encounter (HOSPITAL_COMMUNITY): Payer: Self-pay | Admitting: Emergency Medicine

## 2019-08-18 ENCOUNTER — Inpatient Hospital Stay (HOSPITAL_COMMUNITY)
Admission: EM | Admit: 2019-08-18 | Discharge: 2019-08-21 | DRG: 310 | Disposition: A | Discharge status: Home Health | Payer: No Typology Code available for payment source | Attending: Internal Medicine | Admitting: Internal Medicine

## 2019-08-18 ENCOUNTER — Emergency Department (HOSPITAL_COMMUNITY): Payer: No Typology Code available for payment source

## 2019-08-18 DIAGNOSIS — U071 COVID-19: Secondary | ICD-10-CM

## 2019-08-18 DIAGNOSIS — Z8616 Personal history of COVID-19: Secondary | ICD-10-CM | POA: Diagnosis present

## 2019-08-18 DIAGNOSIS — N1832 Chronic kidney disease, stage 3b: Secondary | ICD-10-CM | POA: Diagnosis not present

## 2019-08-18 DIAGNOSIS — I4891 Unspecified atrial fibrillation: Secondary | ICD-10-CM | POA: Diagnosis not present

## 2019-08-18 DIAGNOSIS — Z87891 Personal history of nicotine dependence: Secondary | ICD-10-CM

## 2019-08-18 DIAGNOSIS — I2510 Atherosclerotic heart disease of native coronary artery without angina pectoris: Secondary | ICD-10-CM

## 2019-08-18 DIAGNOSIS — Z79899 Other long term (current) drug therapy: Secondary | ICD-10-CM

## 2019-08-18 DIAGNOSIS — Z8546 Personal history of malignant neoplasm of prostate: Secondary | ICD-10-CM

## 2019-08-18 DIAGNOSIS — R001 Bradycardia, unspecified: Secondary | ICD-10-CM | POA: Diagnosis not present

## 2019-08-18 DIAGNOSIS — I083 Combined rheumatic disorders of mitral, aortic and tricuspid valves: Secondary | ICD-10-CM | POA: Diagnosis present

## 2019-08-18 DIAGNOSIS — I10 Essential (primary) hypertension: Secondary | ICD-10-CM

## 2019-08-18 DIAGNOSIS — E785 Hyperlipidemia, unspecified: Secondary | ICD-10-CM | POA: Diagnosis present

## 2019-08-18 DIAGNOSIS — I129 Hypertensive chronic kidney disease with stage 1 through stage 4 chronic kidney disease, or unspecified chronic kidney disease: Secondary | ICD-10-CM | POA: Diagnosis present

## 2019-08-18 DIAGNOSIS — I499 Cardiac arrhythmia, unspecified: Secondary | ICD-10-CM

## 2019-08-18 DIAGNOSIS — Z8673 Personal history of transient ischemic attack (TIA), and cerebral infarction without residual deficits: Secondary | ICD-10-CM

## 2019-08-18 DIAGNOSIS — Z7901 Long term (current) use of anticoagulants: Secondary | ICD-10-CM

## 2019-08-18 DIAGNOSIS — R112 Nausea with vomiting, unspecified: Secondary | ICD-10-CM

## 2019-08-18 DIAGNOSIS — T447X5A Adverse effect of beta-adrenoreceptor antagonists, initial encounter: Secondary | ICD-10-CM | POA: Diagnosis present

## 2019-08-18 DIAGNOSIS — R110 Nausea: Secondary | ICD-10-CM

## 2019-08-18 DIAGNOSIS — N1831 Chronic kidney disease, stage 3a: Secondary | ICD-10-CM | POA: Diagnosis present

## 2019-08-18 LAB — CBC WITH DIFFERENTIAL/PLATELET
Abs Immature Granulocytes: 0 10*3/uL (ref 0.00–0.07)
Basophils Absolute: 0 10*3/uL (ref 0.0–0.1)
Basophils Relative: 0 %
Eosinophils Absolute: 0.1 10*3/uL (ref 0.0–0.5)
Eosinophils Relative: 1 %
HCT: 42.2 % (ref 39.0–52.0)
Hemoglobin: 13.8 g/dL (ref 13.0–17.0)
Lymphocytes Relative: 29 %
Lymphs Abs: 1.7 10*3/uL (ref 0.7–4.0)
MCH: 30.6 pg (ref 26.0–34.0)
MCHC: 32.7 g/dL (ref 30.0–36.0)
MCV: 93.6 fL (ref 80.0–100.0)
Monocytes Absolute: 0.2 10*3/uL (ref 0.1–1.0)
Monocytes Relative: 3 %
Neutro Abs: 4 10*3/uL (ref 1.7–7.7)
Neutrophils Relative %: 67 %
Platelets: 351 10*3/uL (ref 150–400)
RBC: 4.51 MIL/uL (ref 4.22–5.81)
RDW: 13.2 % (ref 11.5–15.5)
WBC: 6 10*3/uL (ref 4.0–10.5)
nRBC: 0 % (ref 0.0–0.2)
nRBC: 0 /100 WBC

## 2019-08-18 LAB — COMPREHENSIVE METABOLIC PANEL
ALT: 48 U/L — ABNORMAL HIGH (ref 0–44)
AST: 51 U/L — ABNORMAL HIGH (ref 15–41)
Albumin: 2.4 g/dL — ABNORMAL LOW (ref 3.5–5.0)
Alkaline Phosphatase: 60 U/L (ref 38–126)
Anion gap: 11 (ref 5–15)
BUN: 25 mg/dL — ABNORMAL HIGH (ref 8–23)
CO2: 19 mmol/L — ABNORMAL LOW (ref 22–32)
Calcium: 8.7 mg/dL — ABNORMAL LOW (ref 8.9–10.3)
Chloride: 107 mmol/L (ref 98–111)
Creatinine, Ser: 1.82 mg/dL — ABNORMAL HIGH (ref 0.61–1.24)
GFR calc Af Amer: 38 mL/min — ABNORMAL LOW (ref >60)
GFR calc non Af Amer: 33 mL/min — ABNORMAL LOW (ref >60)
Glucose, Bld: 154 mg/dL — ABNORMAL HIGH (ref 70–99)
Potassium: 4.6 mmol/L (ref 3.5–5.1)
Sodium: 137 mmol/L (ref 135–145)
Total Bilirubin: 0.7 mg/dL (ref 0.3–1.2)
Total Protein: 6.2 g/dL — ABNORMAL LOW (ref 6.5–8.1)

## 2019-08-18 LAB — CBG MONITORING, ED: Glucose-Capillary: 152 mg/dL — ABNORMAL HIGH (ref 70–99)

## 2019-08-18 MED ORDER — ONDANSETRON HCL 4 MG/2ML IJ SOLN
INTRAMUSCULAR | Status: AC
  Filled 2019-08-18: qty 2

## 2019-08-18 NOTE — ED Provider Notes (Signed)
[facility] EMERGENCY DEPARTMENT Provider Note   CSN: [id number] Arrival date & time: 08/18/19  2142     History Chief Complaint  Patient presents with  . Altered Mental Status    Dillon Smith is a 86 y.o. male.  HPI   This patient is an 86 year old male presenting from home, known history of atrial fibrillation coronary disease and hypertension.  According to the medical record the patient was recently seen in the hospital and admitted from February 14 to February 16, discharged yesterday after having an acute ischemic stroke.  He had multiple embolic infarcts that were seen, thought to be due to atrial fibrillation not anticoagulated and he did get TPA.  He improved significantly.  States that he ambulates without a walker or a cane although he has both of them at home.  Primary history comes from the paramedics who state that they were called to the scene because of the patient's altered mental status.  Apparently the patient was talking on the phone to others when he started to not make sense and have some difficulty speaking.  The time they arrived he seemed to be normal however on seeing the patient had an episode of vomiting, that then resolved, in the ambulance he had a heart rate that was transiently in the 30s during which time he was also nauseated but that is resolved as well.  The patient has absolutely no complaints right now including chest pain shortness of breath headache palpitations numbness or weakness.  Paramedics report EKG findings of A. fib.  They shared their cardiac rhythm strips with me, I have personally interpreted them and find there to be some PVCs but underlying atrial fibrillation which appears rate controlled.  When the patient was discharged from the hospital he was placed on Eliquis, amlodipine atorvastatin and metoprolol.  Past Medical History:  Diagnosis Date  . A-fib ([place])   . Coronary artery disease   . Hypertension      Patient Active Problem List   Diagnosis Date Noted  . Bradycardia 08/18/2019  . COVID-19 virus detected 08/17/2019  . Atrial fibrillation ([place]) 08/17/2019  . Hyperlipidemia 08/17/2019  . Chronic kidney disease (CKD) stage G3b/A2, moderately decreased glomerular filtration rate (GFR) between 30-44 mL/min/1.73 square meter and albuminuria creatinine ratio between 30-299 mg/g 08/17/2019  . Acute ischemic stroke (HCC) mult R MCA and PCA embolic infarcts d/t AF not on AC s/p tPA 08/15/2019  . CARCINOMA, PROSTATE 09/15/2007  . Essential hypertension 09/15/2007  . Coronary atherosclerosis 09/15/2007  . HEMORRHOIDS, INTERNAL 09/15/2007  . RADIATION PROCTITIS 09/15/2007    History reviewed. No pertinent surgical history.     No family history on file.  Social History   Tobacco Use  . Smoking status: Former Research scientist (life sciences)  . Smokeless tobacco: Never Used  Substance Use Topics  . Alcohol use: Not on file  . Drug use: Not on file    Home Medications Prior to Admission medications   Medication Sig Start Date End Date Taking? Authorizing Provider  amLODipine (NORVASC) 10 MG tablet Take 10 mg by mouth daily. 06/01/19   [provider]  apixaban (ELIQUIS) 2.5 MG TABS tablet Take 1 tablet (2.5 mg total) by mouth 2 (two) times daily. 08/17/19   Garvin Fila, MD  atorvastatin (LIPITOR) 40 MG tablet Take 1 tablet (40 mg total) by mouth at bedtime. 08/17/19   Garvin Fila, MD  metoprolol tartrate (LOPRESSOR) 50 MG tablet Take 50 mg by mouth 2 (two) times  daily. 06/02/19   [provider]    Allergies    Patient has no known allergies.  Review of Systems   Review of Systems  All other systems reviewed and are negative.   Physical Exam Updated Vital Signs BP 130/78   Pulse (!) 58   Temp (!) 97.3 F (36.3 C) (Oral)   Resp 16   Ht 1.829 m (6')   Wt 93.6 kg   SpO2 99%   BMI 27.99 kg/m   Physical Exam Vitals and nursing note reviewed.  Constitutional:       General: He is not in acute distress.    Appearance: He is well-developed.  HENT:     Head: Normocephalic and atraumatic.     Mouth/Throat:     Pharynx: No oropharyngeal exudate.  Eyes:     General: No scleral icterus.       Right eye: No discharge.        Left eye: No discharge.     Conjunctiva/sclera: Conjunctivae normal.     Pupils: Pupils are equal, round, and reactive to light.  Neck:     Thyroid: No thyromegaly.     Vascular: No JVD.  Cardiovascular:     Rate and Rhythm: Normal rate and regular rhythm.     Heart sounds: Normal heart sounds. No murmur. No friction rub. No gallop.   Pulmonary:     Effort: Pulmonary effort is normal. No respiratory distress.     Breath sounds: Normal breath sounds. No wheezing or rales.  Abdominal:     General: Bowel sounds are normal. There is no distension.     Palpations: Abdomen is soft. There is no mass.     Tenderness: There is no abdominal tenderness.  Musculoskeletal:        General: No tenderness. Normal range of motion.     Cervical back: Normal range of motion and neck supple.  Lymphadenopathy:     Cervical: No cervical adenopathy.  Skin:    General: Skin is warm and dry.     Findings: No erythema or rash.  Neurological:     Mental Status: He is alert.     Coordination: Coordination normal.     Comments: There is no facial droop, the patient speech is a little bit slowed and a little bit slurred but able to answer all of my questions appropriately and in an appropriate fashion.  He moves all 4 extremities with normal strength, no drift, normal finger-nose-finger bilaterally, normal sensation, normal peripheral visual fields and cranial nerves III through XII.  Psychiatric:        Behavior: Behavior normal.     ED Results / Procedures / Treatments   Labs (all labs ordered are listed, but only abnormal results are displayed) Labs Reviewed  COMPREHENSIVE METABOLIC PANEL - Abnormal; Notable for the following components:       Result Value   CO2 19 (*)    Glucose, Bld 154 (*)    BUN 25 (*)    Creatinine, Ser 1.82 (*)    Calcium 8.7 (*)    Total Protein 6.2 (*)    Albumin 2.4 (*)    AST 51 (*)    ALT 48 (*)    GFR calc non Af Amer 33 (*)    GFR calc Af Amer 38 (*)    All other components within normal limits  CBG MONITORING, ED - Abnormal; Notable for the following components:   Glucose-Capillary 152 (*)    All  other components within normal limits  CBC WITH DIFFERENTIAL/PLATELET    EKG EKG Interpretation  Date/Time:  Wednesday August 18 2019 21:49:34 EST Ventricular Rate:  63 PR Interval:    QRS Duration: 122 QT Interval:  465 QTC Calculation: 476 R Axis:   20 Text Interpretation: Atrial fibrillation Multiple ventricular premature complexes Nonspecific intraventricular conduction delay Anteroseptal infarct, old Nonspecific T abnormalities, lateral leads Since last tracing rate slower Confirmed by Noemi Chapel [phone]) on 08/18/2019 10:03:08 PM   Radiology CT Head Wo Contrast  Result Date: 08/18/2019 CLINICAL DATA:  86 year old male with neurologic deficit. EXAM: CT HEAD WITHOUT CONTRAST TECHNIQUE: Contiguous axial images were obtained from the base of the skull through the vertex without intravenous contrast. COMPARISON:  Head CT dated 08/16/2019. FINDINGS: Brain: The ventricles and sulci appropriate size for patient's age. Mild periventricular and deep white matter chronic microvascular ischemic changes noted. There is no acute intracranial hemorrhage. No mass effect or midline shift. No extra-axial fluid collection. Vascular: No hyperdense vessel or unexpected calcification. Skull: Normal. Negative for fracture or focal lesion. Sinuses/Orbits: Mild mucoperiosteal thickening of paranasal sinuses with opacification of several ethmoid air cells. No air-fluid level. The mastoid air cells are clear. Other: None IMPRESSION: 1. No acute intracranial hemorrhage. 2. Age-related atrophy and chronic  microvascular ischemic changes. Electronically Signed   By: Anner Crete M.D.   On: 08/18/2019 22:47    Procedures .Critical Care Performed by: Noemi Chapel, MD Authorized by: Noemi Chapel, MD   Critical care provider statement:    Critical care time (minutes):  35   Critical care time was exclusive of:  Separately billable procedures and treating other patients and teaching time   Critical care was necessary to treat or prevent imminent or life-threatening deterioration of the following conditions:  Cardiac failure   Critical care was time spent personally by me on the following activities:  Blood draw for specimens, development of treatment plan with patient or surrogate, discussions with consultants, evaluation of patient's response to treatment, examination of patient, obtaining history from patient or surrogate, ordering and performing treatments and interventions, ordering and review of laboratory studies, ordering and review of radiographic studies, pulse oximetry, re-evaluation of patient's condition and review of old charts   (including critical care time)  Medications Ordered in ED Medications  ondansetron (ZOFRAN) 4 MG/2ML injection (  Given 08/18/19 2217)    ED Course  I have reviewed the triage vital signs and the nursing notes.  Pertinent labs & imaging results that were available during my care of the patient were reviewed by me and considered in my medical decision making (see chart for details).  Clinical Course as of Aug 17 2306  Wed Aug 18, 2019  2201 I discussed the case with Dr. Cheral Marker of the neuro hospitalist service who agrees that a CT scan of the head is appropriate, however, given that he recently had a full work-up including MR angiogram, CT angiogram, echocardiogram and was given TPA he would not need further aggressive management unless he had waxing and waning symptoms in which case another CTA would be appropriate.  The patient's vital signs remained  in a normal range, he is not tachypneic for me.  His speech remains normal, EKG confirms A. fib   [BM]  2217 The patient has had another episode of bradycardia.  His rhythm strip show that he went significantly bradycardic to approximately 15 bpm, this was short-lived but during that time the patient felt very ill and started vomiting again.  His heart rate is now back up into the 80s in A. fib and he is still vomiting and feeling poorly.   [BM]    Clinical Course User Index [BM] Noemi Chapel, MD   MDM Rules/Calculators/A&P                      At this time the patient's exam is rather unremarkable, he is in rate controlled A. fib, I see no signs of acute neurologic abnormality though it does sound like he had some speech disturbances.  I will discuss this with neurology and get a stat CT scan to make sure he has not had any hemorrhage now that he is anticoagulated.  I discussed the case with the cardiology fellow, she agrees with holding the beta-blocker and admitting to the hospital.  I have personally viewed the CT scan and find no signs of hemorrhage, the labs show that the hemoglobin is normal, white blood cell count is normal and the metabolic panel shows a creatinine of 1.8 which seems baseline.  Electrolytes unremarkable.  I discussed the case with Dr. Myna Hidalgo of the hospitalist service who has been kind enough to admit this patient to the hospital  I perform multiple reevaluations, multiple consultants were involved and the patient had significant cardiac findings associated with his symptoms.  He will need to be admitted to the hospital.  Critical care services provided  Final Clinical Impression(s) / ED Diagnoses Final diagnoses:  Bradycardia  Nausea and vomiting, intractability of vomiting not specified, unspecified vomiting type  Cardiac arrhythmia, unspecified cardiac arrhythmia type    Rx / DC Orders ED Discharge Orders    None       Noemi Chapel, MD 08/18/19 2309

## 2019-08-18 NOTE — ED Triage Notes (Signed)
BIB EMS from home. Pt speaking with friend on phone when he had sudden onset of confusion. Upon EMS arrival pt A/OX4. En route pt became bradycardic and had episode of emesis. HR 50's upon arrival to ED. Pt A/OX4. Just DC from hospital yesterday for stroke. Pt had L sided deficits, slurred speech from this event.

## 2019-08-18 NOTE — ED Notes (Signed)
Pt HR dropped to mid 30's, pt had episode of emesis. Strip printed and given to Dr Sabra Heck, at bedside,

## 2019-08-19 ENCOUNTER — Inpatient Hospital Stay (HOSPITAL_COMMUNITY): Payer: No Typology Code available for payment source

## 2019-08-19 ENCOUNTER — Observation Stay (HOSPITAL_COMMUNITY): Payer: No Typology Code available for payment source

## 2019-08-19 ENCOUNTER — Encounter (HOSPITAL_COMMUNITY): Payer: Self-pay | Admitting: Family Medicine

## 2019-08-19 DIAGNOSIS — N1832 Chronic kidney disease, stage 3b: Secondary | ICD-10-CM | POA: Diagnosis present

## 2019-08-19 DIAGNOSIS — I083 Combined rheumatic disorders of mitral, aortic and tricuspid valves: Secondary | ICD-10-CM | POA: Diagnosis present

## 2019-08-19 DIAGNOSIS — Z8546 Personal history of malignant neoplasm of prostate: Secondary | ICD-10-CM | POA: Diagnosis not present

## 2019-08-19 DIAGNOSIS — Z8673 Personal history of transient ischemic attack (TIA), and cerebral infarction without residual deficits: Secondary | ICD-10-CM

## 2019-08-19 DIAGNOSIS — Z8616 Personal history of COVID-19: Secondary | ICD-10-CM | POA: Diagnosis not present

## 2019-08-19 DIAGNOSIS — Z79899 Other long term (current) drug therapy: Secondary | ICD-10-CM | POA: Diagnosis not present

## 2019-08-19 DIAGNOSIS — Z7901 Long term (current) use of anticoagulants: Secondary | ICD-10-CM | POA: Diagnosis not present

## 2019-08-19 DIAGNOSIS — R001 Bradycardia, unspecified: Secondary | ICD-10-CM | POA: Diagnosis present

## 2019-08-19 DIAGNOSIS — E785 Hyperlipidemia, unspecified: Secondary | ICD-10-CM | POA: Diagnosis present

## 2019-08-19 DIAGNOSIS — Z87891 Personal history of nicotine dependence: Secondary | ICD-10-CM | POA: Diagnosis not present

## 2019-08-19 DIAGNOSIS — R112 Nausea with vomiting, unspecified: Secondary | ICD-10-CM | POA: Diagnosis present

## 2019-08-19 DIAGNOSIS — I4891 Unspecified atrial fibrillation: Secondary | ICD-10-CM | POA: Diagnosis present

## 2019-08-19 DIAGNOSIS — U071 COVID-19: Secondary | ICD-10-CM | POA: Diagnosis not present

## 2019-08-19 DIAGNOSIS — T447X5A Adverse effect of beta-adrenoreceptor antagonists, initial encounter: Secondary | ICD-10-CM | POA: Diagnosis present

## 2019-08-19 DIAGNOSIS — I129 Hypertensive chronic kidney disease with stage 1 through stage 4 chronic kidney disease, or unspecified chronic kidney disease: Secondary | ICD-10-CM | POA: Diagnosis present

## 2019-08-19 DIAGNOSIS — I2510 Atherosclerotic heart disease of native coronary artery without angina pectoris: Secondary | ICD-10-CM | POA: Diagnosis present

## 2019-08-19 DIAGNOSIS — I4819 Other persistent atrial fibrillation: Secondary | ICD-10-CM | POA: Diagnosis not present

## 2019-08-19 LAB — CBC WITH DIFFERENTIAL/PLATELET
Abs Immature Granulocytes: 0 10*3/uL (ref 0.00–0.07)
Basophils Absolute: 0 10*3/uL (ref 0.0–0.1)
Basophils Relative: 0 %
Eosinophils Absolute: 0 10*3/uL (ref 0.0–0.5)
Eosinophils Relative: 0 %
HCT: 42.9 % (ref 39.0–52.0)
Hemoglobin: 14.1 g/dL (ref 13.0–17.0)
Lymphocytes Relative: 10 %
Lymphs Abs: 0.6 10*3/uL — ABNORMAL LOW (ref 0.7–4.0)
MCH: 31.1 pg (ref 26.0–34.0)
MCHC: 32.9 g/dL (ref 30.0–36.0)
MCV: 94.7 fL (ref 80.0–100.0)
Monocytes Absolute: 0.2 10*3/uL (ref 0.1–1.0)
Monocytes Relative: 4 %
Neutro Abs: 5.3 10*3/uL (ref 1.7–7.7)
Neutrophils Relative %: 86 %
Platelets: 322 10*3/uL (ref 150–400)
RBC: 4.53 MIL/uL (ref 4.22–5.81)
RDW: 13.1 % (ref 11.5–15.5)
WBC: 6.2 10*3/uL (ref 4.0–10.5)
nRBC: 0 % (ref 0.0–0.2)
nRBC: 0 /100 WBC

## 2019-08-19 LAB — COMPREHENSIVE METABOLIC PANEL
ALT: 45 U/L — ABNORMAL HIGH (ref 0–44)
AST: 43 U/L — ABNORMAL HIGH (ref 15–41)
Albumin: 2.5 g/dL — ABNORMAL LOW (ref 3.5–5.0)
Alkaline Phosphatase: 55 U/L (ref 38–126)
Anion gap: 12 (ref 5–15)
BUN: 21 mg/dL (ref 8–23)
CO2: 21 mmol/L — ABNORMAL LOW (ref 22–32)
Calcium: 8.9 mg/dL (ref 8.9–10.3)
Chloride: 104 mmol/L (ref 98–111)
Creatinine, Ser: 1.69 mg/dL — ABNORMAL HIGH (ref 0.61–1.24)
GFR calc Af Amer: 41 mL/min — ABNORMAL LOW (ref >60)
GFR calc non Af Amer: 36 mL/min — ABNORMAL LOW (ref >60)
Glucose, Bld: 141 mg/dL — ABNORMAL HIGH (ref 70–99)
Potassium: 5.1 mmol/L (ref 3.5–5.1)
Sodium: 137 mmol/L (ref 135–145)
Total Bilirubin: 0.7 mg/dL (ref 0.3–1.2)
Total Protein: 6.3 g/dL — ABNORMAL LOW (ref 6.5–8.1)

## 2019-08-19 LAB — TSH: TSH: 2.736 u[IU]/mL (ref 0.350–4.500)

## 2019-08-19 LAB — MAGNESIUM: Magnesium: 2.3 mg/dL (ref 1.7–2.4)

## 2019-08-19 LAB — HEPARIN LEVEL (UNFRACTIONATED): Heparin Unfractionated: 0.75 IU/mL — ABNORMAL HIGH (ref 0.30–0.70)

## 2019-08-19 LAB — APTT
aPTT: 101 seconds — ABNORMAL HIGH (ref 24–36)
aPTT: 61 seconds — ABNORMAL HIGH (ref 24–36)

## 2019-08-19 LAB — ABO/RH: ABO/RH(D): O POS

## 2019-08-19 MED ORDER — APIXABAN 2.5 MG PO TABS
2.5000 mg | ORAL_TABLET | Freq: Two times a day (BID) | ORAL | Status: DC
  Filled 2019-08-19: qty 1

## 2019-08-19 MED ORDER — HYDROCODONE-ACETAMINOPHEN 5-325 MG PO TABS: 1.0000 | ORAL_TABLET | Freq: Four times a day (QID) | ORAL | Status: DC | PRN

## 2019-08-19 MED ORDER — ACETAMINOPHEN 325 MG PO TABS: 650.0000 mg | ORAL_TABLET | Freq: Four times a day (QID) | ORAL | Status: DC | PRN

## 2019-08-19 MED ORDER — SODIUM CHLORIDE 0.9% FLUSH
3.0000 mL | Freq: Two times a day (BID) | INTRAVENOUS | Status: DC
  Administered 2019-08-19: 3 mL via INTRAVENOUS

## 2019-08-19 MED ORDER — SODIUM CHLORIDE 0.9 % IV SOLN: 250.0000 mL | INTRAVENOUS | Status: DC | PRN

## 2019-08-19 MED ORDER — PANTOPRAZOLE SODIUM 40 MG PO TBEC
40.0000 mg | DELAYED_RELEASE_TABLET | Freq: Every day | ORAL | Status: DC
  Administered 2019-08-19 – 2019-08-21 (×3): 40 mg via ORAL
  Filled 2019-08-19 (×3): qty 1

## 2019-08-19 MED ORDER — POLYETHYLENE GLYCOL 3350 17 G PO PACK: 17.0000 g | PACK | Freq: Every day | ORAL | Status: DC | PRN

## 2019-08-19 MED ORDER — HEPARIN (PORCINE) 25000 UT/250ML-% IV SOLN
1100.0000 [IU]/h | INTRAVENOUS | Status: DC
  Administered 2019-08-19: 1300 [IU]/h via INTRAVENOUS
  Administered 2019-08-19: 1200 [IU]/h via INTRAVENOUS
  Filled 2019-08-19 (×3): qty 250

## 2019-08-19 MED ORDER — ONDANSETRON HCL 4 MG/2ML IJ SOLN: 4.0000 mg | Freq: Four times a day (QID) | INTRAMUSCULAR | Status: DC | PRN

## 2019-08-19 MED ORDER — SODIUM CHLORIDE 0.9% FLUSH: 3.0000 mL | INTRAVENOUS | Status: DC | PRN

## 2019-08-19 MED ORDER — AMLODIPINE BESYLATE 10 MG PO TABS
10.0000 mg | ORAL_TABLET | Freq: Every day | ORAL | Status: DC
  Administered 2019-08-19 – 2019-08-21 (×3): 10 mg via ORAL
  Filled 2019-08-19 (×3): qty 1

## 2019-08-19 MED ORDER — ATORVASTATIN CALCIUM 40 MG PO TABS
40.0000 mg | ORAL_TABLET | Freq: Every day | ORAL | Status: DC
  Administered 2019-08-19 – 2019-08-20 (×2): 40 mg via ORAL
  Filled 2019-08-19 (×2): qty 1

## 2019-08-19 MED ORDER — ONDANSETRON HCL 4 MG PO TABS: 4.0000 mg | ORAL_TABLET | Freq: Four times a day (QID) | ORAL | Status: DC | PRN

## 2019-08-19 NOTE — Progress Notes (Signed)
Pt transferred onto unit from ED. AOx4. IV intact and infusing. Skin intact. Belongings (clothes, cell phone, wallet) with pt. Call bell within reach. Will continue to treat per MD orders.

## 2019-08-19 NOTE — H&P (Addendum)
History and Physical    JAAMAL Smith [id number] DOB: 04/08/1930 DOA: 08/18/2019  PCP: [name]   Patient coming from: Home    Chief Complaint: Confusion, vomiting  HPI: Dillon Smith is a 86 y.o. male with medical history significant for coronary artery disease, hypertension, chronic kidney disease stageIIIb, and atrial fibrillation not anticoagulated until a few days ago when he was admitted with embolic CVA, recent [id number] diagnosis, now presenting to the emergency department for evaluation of confusion and vomiting.  Patient was speaking on the phone with a friend or family member who thought that he sounded confused and sent EMS to his residence for evaluation of this.  He initially seemed normal with EMS but was noted to have some nausea and nonbloody vomiting and while on a monitor, had transient bradycardia into the 30s.  Bradycardia occurred multiple times prior to arrival in the hospital.  Patient experienced nausea with vomiting during these episodes but denies any lightheadedness, loss of consciousness, or chest pain.  He would fully recover between the episodes.  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  He was discharged on 08/17/2019 with Eliquis.  He had already been on Lopressor, Lipitor, aspirin, and Norvasc.  His deficits improved significantly during the hospitalization.  He was incidentally found to have COVID-19 at time of the recent admission on 08/15/2019 but denies any fevers, chills, cough, or shortness of breath.  ED Course: Upon arrival to the ED, patient is found to be afebrile, saturating well on room air, with heart rate mainly in the 50s to low 60s but also dipping down into the 30s at times.  He was never hypotensive.  EKG features atrial fibrillation with rate 63, PVCs, and nonspecific IVCD.   Chemistry panel notable for creatinine 1.82 and slight elevation in transaminases.  CBC is unremarkable.  ED physician discussed the case with cardiology who recommended medical admission, holding the beta-blocker, and observing on cardiac monitoring.  Review of Systems:  All other systems reviewed and apart from HPI, are negative.  Past Medical History:  Diagnosis Date  . A-fib ([place])   . Coronary artery disease   . Hypertension     History reviewed. No pertinent surgical history.   reports that he has quit smoking. He has never used smokeless tobacco. No history on file for alcohol and drug.  No Known Allergies  History reviewed. No pertinent family history.   Prior to Admission medications   Medication Sig Start Date End Date Taking? Authorizing Provider  amLODipine (NORVASC) 10 MG tablet Take 10 mg by mouth daily. 06/01/19   [provider]  apixaban (ELIQUIS) 2.5 MG TABS tablet Take 1 tablet (2.5 mg total) by mouth 2 (two) times daily. 08/17/19   Garvin Fila, MD  atorvastatin (LIPITOR) 40 MG tablet Take 1 tablet (40 mg total) by mouth at bedtime. 08/17/19   Garvin Fila, MD  metoprolol tartrate (LOPRESSOR) 50 MG tablet Take 50 mg by mouth 2 (two) times daily. 06/02/19   [provider]    Physical Exam: Vitals:   08/19/19 0000 08/19/19 0015 08/19/19 0030 08/19/19 0045  BP: 137/66 (!) 157/86 137/79 (!) 147/77  Pulse: (!) 39 (!) 51  (!) 47  Resp: 15  20 17   Temp:      TempSrc:      SpO2: 99% 100%  97%  Weight:      Height:        Constitutional: NAD, calm  Eyes: PERTLA, lids and conjunctivae normal ENMT: Mucous membranes are moist. Posterior pharynx clear of any exudate or lesions.   Neck: soft non-tender mass at anterior neck, supple   Respiratory:  no wheezing, no crackles. No accessory muscle use.  Cardiovascular: Rate ~60 and irregularly irregular. No extremity edema. Abdomen: No distension, no tenderness, soft. Bowel sounds active.    Musculoskeletal: no clubbing / cyanosis. No joint deformity upper and lower extremities.   Skin: no significant rashes, lesions, ulcers. Warm, dry, well-perfused. Neurologic: Gross hearing deficit, no dysarthria or gross facial asymmetry. Sensation to light touch intact. Moving all extremities.  Psychiatric: Alert and oriented to person, place, and situation. Pleasant and cooperative.    Labs and Imaging on Admission: I have personally reviewed following labs and imaging studies  CBC: Recent Labs  Lab 08/15/19 1942 08/15/19 1948 08/16/19 1146 08/17/19 0457 08/18/19 2150  WBC 3.7*  --  3.8* 4.2 6.0  NEUTROABS 2.3  --   --   --  4.0  HGB 14.9 15.3 15.3 14.5 13.8  HCT 44.9 45.0 46.7 43.4 42.2  MCV 92.4  --  94.0 91.2 93.6  PLT 277  --  285 279 [id number]   Basic Metabolic Panel: Recent Labs  Lab 08/15/19 1942 08/15/19 1948 08/16/19 1146 08/17/19 0457 08/18/19 2150  NA 131* 132* 137 135 137  K 4.5 4.5 4.7 4.6 4.6  CL 99 101 102 103 107  CO2 20*  --  20* 20* 19*  GLUCOSE 126* 121* 102* 118* 154*  BUN 23 24* 23 21 25*  CREATININE 1.98* 1.90* 1.68* 1.52* 1.82*  CALCIUM 8.5*  --  8.6* 8.5* 8.7*   GFR: Estimated Creatinine Clearance: 34 mL/min (A) (by C-G formula based on SCr of 1.82 mg/dL (H)). Liver Function Tests: Recent Labs  Lab 08/15/19 1942 08/18/19 2150  AST 48* 51*  ALT 41 48*  ALKPHOS 53 60  BILITOT 0.7 0.7  PROT 6.5 6.2*  ALBUMIN 2.3* 2.4*   No results for input(s): LIPASE, AMYLASE in the last 168 hours. No results for input(s): AMMONIA in the last 168 hours. Coagulation Profile: Recent Labs  Lab 08/15/19 1942  INR 1.1   Cardiac Enzymes: No results for input(s): CKTOTAL, CKMB, CKMBINDEX, TROPONINI in the last 168 hours. BNP (last 3 results) No results for input(s): PROBNP in the last 8760 hours. HbA1C: Recent Labs    08/16/19 0242  HGBA1C 6.3*   CBG: Recent Labs  Lab 08/15/19 2056 08/17/19 1136 08/18/19 2156  GLUCAP 108* 94 152*   Lipid  Profile: Recent Labs    08/16/19 0242  CHOL 146  HDL 23*  LDLCALC 101*  TRIG 109  CHOLHDL 6.3   Thyroid Function Tests: No results for input(s): TSH, T4TOTAL, FREET4, T3FREE, THYROIDAB in the last 72 hours. Anemia Panel: No results for input(s): VITAMINB12, FOLATE, FERRITIN, TIBC, IRON, RETICCTPCT in the last 72 hours. Urine analysis: No results found for: COLORURINE, APPEARANCEUR, LABSPEC, PHURINE, GLUCOSEU, HGBUR, BILIRUBINUR, KETONESUR, PROTEINUR, UROBILINOGEN, NITRITE, LEUKOCYTESUR Sepsis Labs: @LABRCNTIP (procalcitonin:4,lacticidven:4) ) Recent Results (from the past 240 hour(s))  Respiratory Panel by RT PCR (Flu A&B, Covid) - Nasopharyngeal Swab     Status: Abnormal   Collection Time: 08/15/19 10:00 PM   Specimen: Nasopharyngeal Swab  Result Value Ref Range Status   SARS Coronavirus 2 by RT PCR POSITIVE (A) NEGATIVE Final    Comment: RESULT CALLED TO, READ BACK  BY AND VERIFIED WITH: M. PLUMMER,RN [id number] 08/16/2019 T. TYSOR (NOTE) SARS-CoV-2 target nucleic acids are DETECTED. SARS-CoV-2 RNA is generally detectable in upper respiratory specimens  during the acute phase of infection. Positive results are indicative of the presence of the identified virus, but do not rule out bacterial infection or co-infection with other pathogens not detected by the test. Clinical correlation with patient history and other diagnostic information is necessary to determine patient infection status. The expected result is Negative. Fact Sheet for Patients:  PinkCheek.be Fact Sheet for Healthcare Providers: GravelBags.it This test is not yet approved or cleared by the [place] FDA and  has been authorized for detection and/or diagnosis of SARS-CoV-2 by FDA under an Emergency Use Authorization (EUA).  This EUA will remain in effect (meaning this test can be used ) for the duration of  the COVID-19 declaration under Section 564(b)(1)  of the Act, 21 U.S.C. section 360bbb-3(b)(1), unless the authorization is terminated or revoked sooner.    Influenza A by PCR NEGATIVE NEGATIVE Final   Influenza B by PCR NEGATIVE NEGATIVE Final    Comment: (NOTE) The Xpert Xpress SARS-CoV-2/FLU/RSV assay is intended as an aid in  the diagnosis of influenza from Nasopharyngeal swab specimens and  should not be used as a sole basis for treatment. Nasal washings and  aspirates are unacceptable for Xpert Xpress SARS-CoV-2/FLU/RSV  testing. Fact Sheet for Patients: PinkCheek.be Fact Sheet for Healthcare Providers: GravelBags.it This test is not yet approved or cleared by the [place] FDA and  has been authorized for detection and/or diagnosis of SARS-CoV-2 by  FDA under an Emergency Use Authorization (EUA). This EUA will remain  in effect (meaning this test can be used) for the duration of the  Covid-19 declaration under Section 564(b)(1) of the Act, 21  U.S.C. section 360bbb-3(b)(1), unless the authorization is  terminated or revoked. Performed at [facility], [place] [address], [place], [place] [address]   MRSA PCR Screening     Status: None   Collection Time: 08/15/19 11:00 PM  Result Value Ref Range Status   MRSA by PCR NEGATIVE NEGATIVE Final    Comment:        The GeneXpert MRSA Assay (FDA approved for NASAL specimens only), is one component of a comprehensive MRSA colonization surveillance program. It is not intended to diagnose MRSA infection nor to guide or monitor treatment for MRSA infections. Performed at [facility], [place] [address], [place], [place] [address]      Radiological Exams on Admission: CT Head Wo Contrast  Result Date: 08/18/2019 CLINICAL DATA:  86 year old male with neurologic deficit. EXAM: CT HEAD WITHOUT CONTRAST TECHNIQUE: Contiguous axial images were obtained from the base of the skull through the vertex without  intravenous contrast. COMPARISON:  Head CT dated 08/16/2019. FINDINGS: Brain: The ventricles and sulci appropriate size for patient's age. Mild periventricular and deep white matter chronic microvascular ischemic changes noted. There is no acute intracranial hemorrhage. No mass effect or midline shift. No extra-axial fluid collection. Vascular: No hyperdense vessel or unexpected calcification. Skull: Normal. Negative for fracture or focal lesion. Sinuses/Orbits: Mild mucoperiosteal thickening of paranasal sinuses with opacification of several ethmoid air cells. No air-fluid level. The mastoid air cells are clear. Other: None IMPRESSION: 1. No acute intracranial hemorrhage. 2. Age-related atrophy and chronic microvascular ischemic changes. Electronically Signed   By: Anner Crete M.D.   On: 08/18/2019 22:47    EKG: Independently reviewed. Atrial fibrillation, rate 63, PVC's, non-specific IVCD.  Assessment/Plan   1. Bradycardia  - Presents with transient confusion and episodes of bradycardia to low-mid 30's  - HR in low-mid 30's was also documented on 08/16/19 during his recent admission  - BP has remained stable   - ED physician discussed with cardiology and it was recommended that metoprolol be held and patient monitored off of the beta-blocker    2. Atrial fibrillation  - In atrial fibrillation in ED  - CHADS-VASc is 35 (age x2, CVA x2, CAD, HTN) - Hold Eliquis and use IV heparin for now in case he needs pacemaker this admission, hold metoprolol    3. History of CVA  - Admitted from [id number] after embolic CVA that was treated with tPA  - His deficits have continued to improve  - Continue statin and anticoagulation    4. CKD IIIb  - SCr is 1.82 on admission, similar to priors  - Renally-dose medications, monitor    5. CAD - No anginal complaints  - Continue statin, hold beta-blocker    6. Hypertension  - BP at goal  - Hold Lopressor as above   7. COVID-19  - Diagnosed  incidentally when admitted on 08/15/19  - Patient remains asymptomatic, will continue isolation and monitor    DVT prophylaxis: Eliquis pta, IV heparin started on admission  Code Status: Full Family Communication: Discussed with patient  Disposition Plan: Likely back home once bradycardia stabilized  Consults called: ED physician discussed with cardiology and was given recommendations   Admission status: Observation    Vianne Bulls, MD [facility] Pager: See [email or web address]  If 7AM-7PM, please contact the daytime attending [email or web address]  08/19/2019, 1:31 AM

## 2019-08-19 NOTE — Progress Notes (Signed)
ANTICOAGULATION CONSULT NOTE - follow up Pharmacy Consult for heparin Indication: atrial fibrillation  No Known Allergies  Patient Measurements: Height: 6' (182.9 cm) Weight: 206 lb 5.6 oz (93.6 kg) IBW/kg (Calculated) : 77.6 Heparin Dosing Weight: 93.6 kg  Vital Signs: Temp: 97.3 F (36.3 C) (02/18 2007) Temp Source: Oral (02/18 2007) BP: 157/65 (02/18 2009) Pulse Rate: 54 (02/18 2009)  Labs: Recent Labs    08/17/19 0457 08/17/19 0457 08/18/19 2150 08/19/19 0458 08/19/19 1141 08/19/19 1944  HGB 14.5   < > 13.8 14.1  --   --   HCT 43.4  --  42.2 42.9  --   --   PLT 279  --  351 322  --   --   APTT  --   --   --   --  101* 61*  HEPARINUNFRC  --   --   --   --  0.75*  --   CREATININE 1.52*  --  1.82* 1.69*  --   --    < > = values in this interval not displayed.    Estimated Creatinine Clearance: 36.6 mL/min (A) (by C-G formula based on SCr of 1.69 mg/dL (H)).   Medical History: Past Medical History:  Diagnosis Date  . A-fib ([place])   . Coronary artery disease   . Hypertension     Medications:  See medication history  Assessment: 86 year old male started on heparin drip for atrial fibrillation.  He was on eliquis PTA - last dose on 2/16 at 1600 PM.    APTT this PM is slightly low at 61 on 1200 units/hr.  Will increase slightly to 1300 units/hr and recheck.  Using aPTT to monitor heparin until HL correlate with aPTT, due to effect of eliquis on heparin levels. CBC has been stable and no bleeding reported.   Goal of Therapy:  APTT 66-102 sec Heparin level 0.3-0.7 units/ml Monitor platelets by anticoagulation protocol: Yes   Plan:  Increase IV heparin drip to 1300 units/hr. Follow-up  APTT and HL in AM CBC daily Monitor for bleeding complications  Sloan Leiter, PharmD, BCPS, BCCCP Clinical Pharmacist Please refer to [facility] for [place] numbers 08/19/2019,8:23 PM

## 2019-08-19 NOTE — Progress Notes (Signed)
ANTICOAGULATION CONSULT NOTE - Initial Consult  Pharmacy Consult for heparin Indication: atrial fibrillation  No Known Allergies  Patient Measurements: Height: 6' (182.9 cm) Weight: 206 lb 5.6 oz (93.6 kg) IBW/kg (Calculated) : 77.6 Heparin Dosing Weight: 93.6 kg  Vital Signs: Temp: 97.3 F (36.3 C) (02/17 2147) Temp Source: Oral (02/17 2147) BP: 146/94 (02/18 0230) Pulse Rate: 48 (02/18 0230)  Labs: Recent Labs    08/16/19 1146 08/16/19 1146 08/17/19 0457 08/18/19 2150  HGB 15.3   < > 14.5 13.8  HCT 46.7  --  43.4 42.2  PLT 285  --  279 351  CREATININE 1.68*  --  1.52* 1.82*   < > = values in this interval not displayed.    Estimated Creatinine Clearance: 34 mL/min (A) (by C-G formula based on SCr of 1.82 mg/dL (H)).   Medical History: Past Medical History:  Diagnosis Date  . A-fib ([place])   . Coronary artery disease   . Hypertension     Medications:  See medication history  Assessment: 86 yo man to start heparin for afib.  He was on eliquis PTA.  Hg 13.8, PTLC 351 Goal of Therapy:  APTT 66-102 sec Heparin level 0.3-0.7 units/ml Monitor platelets by anticoagulation protocol: Yes   Plan:  Start heparin drip at 1200 units/hr Check heparin level and aPTT 8 hours after start and daily. CBC daily Monitor for bleeding complications  Montina Dorrance Poteet 08/19/2019,2:47 AM

## 2019-08-19 NOTE — Progress Notes (Signed)
PROGRESS NOTE                                                                                                                                                                                                             Patient Demographics:    Dillon Smith, is a 86 y.o. male, DOB - May 19, 1930, [id number]  Outpatient Primary MD for the patient is [name]    LOS - 0  Admit date - 08/18/2019    Chief Complaint  Patient presents with   Altered Mental Status       Brief Narrative  - Dillon Smith is a 86 y.o. male with medical history significant for coronary artery disease, hypertension, chronic kidney disease stageIIIb, and atrial fibrillation not anticoagulated until a few days ago when he was admitted with embolic CVA, recent [id number] diagnosis, now presenting to the emergency department for evaluation of confusion and vomiting, was also found to be severely bradycardic and admitted to the hospital.    Subjective:    [name] today has, No headache, No chest pain, No abdominal pain - No Nausea, No new weakness tingling or numbness, no SOB or nausea.   Assessment  & Plan :     1.  Acute Covid 19 Viral infection - was diagnosed with it last admission, this is clinically resolved.  Encouraged the patient to sit up in chair in the daytime use I-S and flutter valve for pulmonary toiletry and then prone in bed when at night.    SpO2: 99 %  Recent Labs  Lab 08/15/19 2200  SARSCOV2NAA POSITIVE*    2. 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  Was discharged from stroke team.  Was discharged on Eliquis, due to recent nausea vomiting currently on heparin drip.  Continue supportive care.  3.  Chronic atrial fibrillation.  Mali vas 2 score of at least 4.  Heparin drip, beta-blocker held due to bradycardia.  4.  Bradycardia.  Likely beta-blocker induced.  Hold  beta-blocker, monitor chronotropic response with activity.  Monitor on telemetry.  TSH is pending.  Will follow.  4.  Nausea vomiting.  Upon admission.  Currently resolved, head CT nonacute, abdominal exam benign, will check KUB to rule out stool burden.  Monitor with supportive care.  His nausea and vomiting could have  been due to hypotension from bradycardia.  5.  Dyslipidemia.  On Lipitor.  6.  Hypertension.  On Norvasc.  7.  CKD 3.  Creatinine close to baseline of 1.7-1.8.    Condition - Fair  Family Communication  : Updated brother in detail over the phone  Code Status :  Full  Diet :   Diet Order            Diet Heart Room service appropriate? Yes; Fluid consistency: Thin  Diet effective now               Disposition Plan  : In the hospital for bradycardia, nausea vomiting, IV heparin has been unable to tolerate oral Eliquis.  Consults  : None  Procedures  :    CT head.  Nonacute  PUD Prophylaxis : Start PPI  DVT Prophylaxis  :  Heparin  gtt  Lab Results  Component Value Date   PLT 322 08/19/2019    Inpatient Medications  Scheduled Meds:  atorvastatin  40 mg Oral q1800   Continuous Infusions:  heparin 1,200 Units/hr (08/19/19 0304)   PRN Meds:.acetaminophen, [DISCONTINUED] ondansetron **OR** ondansetron (ZOFRAN) IV, polyethylene glycol  Antibiotics  :    Anti-infectives (From admission, onward)   None       Time Spent in minutes  30   Lala Lund M.D on 08/19/2019 at 11:17 AM  To page go to [email or web address] - password [facility]  [facility] -  Office  [phone]     See all Orders from today for further details    Objective:   Vitals:   08/19/19 0545 08/19/19 0615 08/19/19 0700 08/19/19 0840  BP: (!) 142/76 (!) 143/86 (!) 147/79 (!) 144/75  Pulse: (!) 40 (!) 59 (!) 58 77  Resp: 16 13 16 19   Temp:    (!) 97.2 F (36.2 C)  TempSrc:    Oral  SpO2: 100% 100% 99% 99%  Weight:      Height:        Wt Readings from Last 3  Encounters:  08/18/19 93.6 kg  08/16/19 93.6 kg     Intake/Output Summary (Last 24 hours) at 08/19/2019 1117 Last data filed at 08/19/2019 0423 Gross per 24 hour  Intake --  Output 250 ml  Net -250 ml     Physical Exam  Awake Alert, No new F.N deficits, Normal affect [place].AT,PERRAL Supple Neck,No JVD, No cervical lymphadenopathy appriciated.  Symmetrical Chest wall movement, Good air movement bilaterally, CTAB RRR,No Gallops,Rubs or new Murmurs, No Parasternal Heave +ve B.Sounds, Abd Soft, No tenderness, No organomegaly appriciated, No rebound - guarding or rigidity. No Cyanosis, Clubbing or edema, No new Rash or bruise      Data Review:    CBC Recent Labs  Lab 08/15/19 1942 08/15/19 1942 08/15/19 1948 08/16/19 1146 08/17/19 0457 08/18/19 2150 08/19/19 0458  WBC 3.7*  --   --  3.8* 4.2 6.0 6.2  HGB 14.9   < > 15.3 15.3 14.5 13.8 14.1  HCT 44.9   < > 45.0 46.7 43.4 42.2 42.9  PLT 277  --   --  285 279 351 322  MCV 92.4  --   --  94.0 91.2 93.6 94.7  MCH 30.7  --   --  30.8 30.5 30.6 31.1  MCHC 33.2  --   --  32.8 33.4 32.7 32.9  RDW 13.0  --   --  13.2 13.2 13.2 13.1  LYMPHSABS 1.0  --   --   --   --  1.7 0.6*  MONOABS 0.3  --   --   --   --  0.2 0.2  EOSABS 0.0  --   --   --   --  0.1 0.0  BASOSABS 0.0  --   --   --   --  0.0 0.0   < > = values in this interval not displayed.    Chemistries  Recent Labs  Lab 08/15/19 1942 08/15/19 1942 08/15/19 1948 08/16/19 1146 08/17/19 0457 08/18/19 2150 08/19/19 0458  NA 131*   < > 132* 137 135 137 137  K 4.5   < > 4.5 4.7 4.6 4.6 5.1  CL 99   < > 101 102 103 107 104  CO2 20*  --   --  20* 20* 19* 21*  GLUCOSE 126*   < > 121* 102* 118* 154* 141*  BUN 23   < > 24* 23 21 25* 21  CREATININE 1.98*   < > 1.90* 1.68* 1.52* 1.82* 1.69*  CALCIUM 8.5*  --   --  8.6* 8.5* 8.7* 8.9  MG  --   --   --   --   --   --  2.3  AST 48*  --   --   --   --  51* 43*  ALT 41  --   --   --   --  48* 45*  ALKPHOS 53  --   --   --    --  60 55  BILITOT 0.7  --   --   --   --  0.7 0.7   < > = values in this interval not displayed.   ------------------------------------------------------------------------------------------------------------------ No results for input(s): CHOL, HDL, LDLCALC, TRIG, CHOLHDL, LDLDIRECT in the last 72 hours.  Lab Results  Component Value Date   HGBA1C 6.3 (H) 08/16/2019   ------------------------------------------------------------------------------------------------------------------ No results for input(s): TSH, T4TOTAL, T3FREE, THYROIDAB in the last 72 hours.  Invalid input(s): FREET3  Cardiac Enzymes No results for input(s): CKMB, TROPONINI, MYOGLOBIN in the last 168 hours.  Invalid input(s): CK ------------------------------------------------------------------------------------------------------------------ No results found for: BNP  Micro Results Recent Results (from the past 240 hour(s))  Respiratory Panel by RT PCR (Flu A&B, Covid) - Nasopharyngeal Swab     Status: Abnormal   Collection Time: 08/15/19 10:00 PM   Specimen: Nasopharyngeal Swab  Result Value Ref Range Status   SARS Coronavirus 2 by RT PCR POSITIVE (A) NEGATIVE Final    Comment: RESULT CALLED TO, READ BACK BY AND VERIFIED WITH: M. PLUMMER,RN [id number] 08/16/2019 T. TYSOR (NOTE) SARS-CoV-2 target nucleic acids are DETECTED. SARS-CoV-2 RNA is generally detectable in upper respiratory specimens  during the acute phase of infection. Positive results are indicative of the presence of the identified virus, but do not rule out bacterial infection or co-infection with other pathogens not detected by the test. Clinical correlation with patient history and other diagnostic information is necessary to determine patient infection status. The expected result is Negative. Fact Sheet for Patients:  PinkCheek.be Fact Sheet for Healthcare Providers: GravelBags.it This  test is not yet approved or cleared by the [place] FDA and  has been authorized for detection and/or diagnosis of SARS-CoV-2 by FDA under an Emergency Use Authorization (EUA).  This EUA will remain in effect (meaning this test can be used ) for the duration of  the COVID-19 declaration under Section 564(b)(1) of the Act, 21 U.S.C. section 360bbb-3(b)(1), unless the authorization is terminated or revoked sooner.    Influenza  A by PCR NEGATIVE NEGATIVE Final   Influenza B by PCR NEGATIVE NEGATIVE Final    Comment: (NOTE) The Xpert Xpress SARS-CoV-2/FLU/RSV assay is intended as an aid in  the diagnosis of influenza from Nasopharyngeal swab specimens and  should not be used as a sole basis for treatment. Nasal washings and  aspirates are unacceptable for Xpert Xpress SARS-CoV-2/FLU/RSV  testing. Fact Sheet for Patients: PinkCheek.be Fact Sheet for Healthcare Providers: GravelBags.it This test is not yet approved or cleared by the [place] FDA and  has been authorized for detection and/or diagnosis of SARS-CoV-2 by  FDA under an Emergency Use Authorization (EUA). This EUA will remain  in effect (meaning this test can be used) for the duration of the  Covid-19 declaration under Section 564(b)(1) of the Act, 21  U.S.C. section 360bbb-3(b)(1), unless the authorization is  terminated or revoked. Performed at [facility], [place] [address], [place], [place] [address]   MRSA PCR Screening     Status: None   Collection Time: 08/15/19 11:00 PM  Result Value Ref Range Status   MRSA by PCR NEGATIVE NEGATIVE Final    Comment:        The GeneXpert MRSA Assay (FDA approved for NASAL specimens only), is one component of a comprehensive MRSA colonization surveillance program. It is not intended to diagnose MRSA infection nor to guide or monitor treatment for MRSA infections. Performed at [facility], [place]  [address] [place], [place] [address]     Radiology Reports CT Code Stroke CTA Head W/WO contrast  Result Date: 08/15/2019 CLINICAL DATA:  Right-sided gaze disturbance. Slurred speech. Left-sided weakness. EXAM: CT ANGIOGRAPHY HEAD AND NECK CT PERFUSION BRAIN TECHNIQUE: Multidetector CT imaging of the head and neck was performed using the standard protocol during bolus administration of intravenous contrast. Multiplanar CT image reconstructions and MIPs were obtained to evaluate the vascular anatomy. Carotid stenosis measurements (when applicable) are obtained utilizing NASCET criteria, using the distal internal carotid diameter as the denominator. Multiphase CT imaging of the brain was performed following IV bolus contrast injection. Subsequent parametric perfusion maps were calculated using RAPID software. CONTRAST:  124mL OMNIPAQUE IOHEXOL 350 MG/ML SOLN COMPARISON:  Head CT earlier same day. FINDINGS: CTA NECK FINDINGS Aortic arch: Aortic atherosclerosis. Branching pattern is normal without origin stenosis. Right carotid system: Common carotid artery widely patent to the bifurcation. Soft and calcified plaque at the carotid bifurcation and ICA bulb. Minimal diameter of the ICA bulb is 3.7 mm. Compared to a more distal cervical ICA diameter of 4.3 mm, this indicates a 15% stenosis. Left carotid system: Common carotid artery widely patent to the bifurcation. Calcified plaque at the carotid bifurcation and ICA bulb. Minimal diameter is 4.5 mm, the same as the more distal cervical ICA. Therefore there is no stenosis. The cervical ICA is markedly tortuous. Vertebral arteries: There is calcified plaque at both vertebral artery origins. Severe stenosis on the right, 80% or greater. Moderate stenosis on the left, 50-75%. Both vertebral arteries do show flow to the foramen magnum. Skeleton: Ordinary spondylosis. Other neck: Soft tissue lipoma of the anterior midline neck. Upper chest: Mild peripheral scarring on the  right. Review of the MIP images confirms the above findings CTA HEAD FINDINGS Anterior circulation: Both internal carotid arteries are patent through the skull base and siphon regions. There is siphon atherosclerotic calcification but without stenosis greater than 30%. No large or medium vessel occlusion identified on the left. There is a single azygos anterior cerebral  artery supplying both anterior cerebral artery territories. On the right, there is some atherosclerotic irregularity of the M1 segment but no flow limiting stenosis. There is an occluded M3 branch serving the frontal operculum. Posterior circulation: Both vertebral arteries are patent through the foramen magnum to the basilar. Before atherosclerotic disease but without stenosis greater than 30%. 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 Superior cerebellar and posterior cerebral arteries show flow. Venous sinuses: Patent and normal. Anatomic variants: None significant. Review of the MIP images confirms the above findings CT Brain Perfusion Findings: ASPECTS: 10 Perfusion data is not reliable. In looking at the source data, there does appear to be a small region of diminished cerebral blood volume in the right frontal operculum, estimated at about 15 cc. There is also diminished cerebral blood flow and delayed mean transit time in that region. 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 Perfusion data is unreliable because of motion. Viewing the source images, the patient does demonstrate a region of diminished cerebral blood volume, cerebral blood flow and prolonged mean transit time in the frontal operculum ir region, affecting about 15-20 cc of brain. Atherosclerotic disease at the aortic arch. Atherosclerotic disease at both carotid bifurcations. 15% ICA stenosis on the right. No stenosis on the left. Atherosclerotic  disease in the carotid siphon regions but without stenosis greater than 30%. Stenosis of both vertebral artery origins, severe on the right and moderate on the left. 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 Findings discussed with Dr. Rory Percy during performance. Electronically Signed   By: Nelson Chimes M.D.   On: 08/15/2019 20:56   CT Head Wo Contrast  Result Date: 08/18/2019 CLINICAL DATA:  86 year old male with neurologic deficit. EXAM: CT HEAD WITHOUT CONTRAST TECHNIQUE: Contiguous axial images were obtained from the base of the skull through the vertex without intravenous contrast. COMPARISON:  Head CT dated 08/16/2019. FINDINGS: Brain: The ventricles and sulci appropriate size for patient's age. Mild periventricular and deep white matter chronic microvascular ischemic changes noted. There is no acute intracranial hemorrhage. No mass effect or midline shift. No extra-axial fluid collection. Vascular: No hyperdense vessel or unexpected calcification. Skull: Normal. Negative for fracture or focal lesion. Sinuses/Orbits: Mild mucoperiosteal thickening of paranasal sinuses with opacification of several ethmoid air cells. No air-fluid level. The mastoid air cells are clear. Other: None IMPRESSION: 1. No acute intracranial hemorrhage. 2. Age-related atrophy and chronic microvascular ischemic changes. Electronically Signed   By: Anner Crete M.D.   On: 08/18/2019 22:47   CT HEAD WO CONTRAST  Result Date: 08/16/2019 CLINICAL DATA:  86 year old male 24 hours status post tPA for code stroke presentation. Right M3 branch occlusion suspected on CTA yesterday. EXAM: CT HEAD WITHOUT CONTRAST TECHNIQUE: Contiguous axial images were obtained from the base of the skull through the vertex without intravenous contrast. COMPARISON:  CTA head and neck 08/15/2019 and earlier. FINDINGS: Brain: No intracranial mass effect. No acute intracranial hemorrhage identified. No definite acute or evolving cortical infarct  identified in the right hemisphere; hypodense area on series 3, image 20 appears to be a sulcus. And patchy bilateral white matter hypodensity has not definitely changed. Stable gray-white matter differentiation elsewhere. Normal basilar cisterns. No midline shift. Stable ventricle size and configuration. Vascular: Calcified atherosclerosis at the skull base. No suspicious intracranial vascular hyperdensity. Skull: No acute osseous abnormality identified. Sinuses/Orbits: Visualized paranasal sinuses and mastoids are stable and well  pneumatized. Other: Resolved gaze deviation. No acute orbit or scalp soft tissue finding. IMPRESSION: 1. No acute or evolving cerebral infarct identified by CT compared to yesterday. 2. No intracranial hemorrhage or mass effect. Electronically Signed   By: Genevie Ann M.D.   On: 08/16/2019 18:23   CT Code Stroke CTA Neck W/WO contrast  Result Date: 08/15/2019 CLINICAL DATA:  Right-sided gaze disturbance. Slurred speech. Left-sided weakness. EXAM: CT ANGIOGRAPHY HEAD AND NECK CT PERFUSION BRAIN TECHNIQUE: Multidetector CT imaging of the head and neck was performed using the standard protocol during bolus administration of intravenous contrast. Multiplanar CT image reconstructions and MIPs were obtained to evaluate the vascular anatomy. Carotid stenosis measurements (when applicable) are obtained utilizing NASCET criteria, using the distal internal carotid diameter as the denominator. Multiphase CT imaging of the brain was performed following IV bolus contrast injection. Subsequent parametric perfusion maps were calculated using RAPID software. CONTRAST:  132mL OMNIPAQUE IOHEXOL 350 MG/ML SOLN COMPARISON:  Head CT earlier same day. FINDINGS: CTA NECK FINDINGS Aortic arch: Aortic atherosclerosis. Branching pattern is normal without origin stenosis. Right carotid system: Common carotid artery widely patent to the bifurcation. Soft and calcified plaque at the carotid bifurcation and ICA bulb.  Minimal diameter of the ICA bulb is 3.7 mm. Compared to a more distal cervical ICA diameter of 4.3 mm, this indicates a 15% stenosis. Left carotid system: Common carotid artery widely patent to the bifurcation. Calcified plaque at the carotid bifurcation and ICA bulb. Minimal diameter is 4.5 mm, the same as the more distal cervical ICA. Therefore there is no stenosis. The cervical ICA is markedly tortuous. Vertebral arteries: There is calcified plaque at both vertebral artery origins. Severe stenosis on the right, 80% or greater. Moderate stenosis on the left, 50-75%. Both vertebral arteries do show flow to the foramen magnum. Skeleton: Ordinary spondylosis. Other neck: Soft tissue lipoma of the anterior midline neck. Upper chest: Mild peripheral scarring on the right. Review of the MIP images confirms the above findings CTA HEAD FINDINGS Anterior circulation: Both internal carotid arteries are patent through the skull base and siphon regions. There is siphon atherosclerotic calcification but without stenosis greater than 30%. No large or medium vessel occlusion identified on the left. There is a single azygos anterior cerebral artery supplying both anterior cerebral artery territories. On the right, there is some atherosclerotic irregularity of the M1 segment but no flow limiting stenosis. There is an occluded M3 branch serving the frontal operculum. Posterior circulation: Both vertebral arteries are patent through the foramen magnum to the basilar. Before atherosclerotic disease but without stenosis greater than 30%. 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 Superior cerebellar and posterior cerebral arteries show flow. Venous sinuses: Patent and normal. Anatomic variants: None significant. Review of the MIP images confirms the above findings CT Brain Perfusion Findings: ASPECTS: 10 Perfusion data is not reliable. In  looking at the source data, there does appear to be a small region of diminished cerebral blood volume in the right frontal operculum, estimated at about 15 cc. There is also diminished cerebral blood flow and delayed mean transit time in that region. 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 Perfusion data is unreliable because of motion. Viewing the source images, the patient does demonstrate a region of diminished cerebral blood volume, cerebral blood flow and prolonged mean transit time in the frontal operculum ir region, affecting about 15-20 cc  of brain. Atherosclerotic disease at the aortic arch. Atherosclerotic disease at both carotid bifurcations. 15% ICA stenosis on the right. No stenosis on the left. Atherosclerotic disease in the carotid siphon regions but without stenosis greater than 30%. Stenosis of both vertebral artery origins, severe on the right and moderate on the left. 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 Findings discussed with Dr. Rory Percy during performance. Electronically Signed   By: Nelson Chimes M.D.   On: 08/15/2019 20:56   MR ANGIO HEAD WO CONTRAST  Result Date: 08/16/2019 CLINICAL DATA:  Stroke follow-up. Status post tPA. EXAM: MRI HEAD WITHOUT CONTRAST MRA HEAD WITHOUT CONTRAST TECHNIQUE: Multiplanar, multiecho pulse sequences of the brain and surrounding structures were obtained without intravenous contrast. Angiographic images of the head were obtained using MRA technique without contrast. COMPARISON:  None. FINDINGS: MRI HEAD FINDINGS Brain: Multifocal abnormal diffusion restriction within the right hemisphere, predominantly within the right frontal lobe and right insula. Small area diffusion restriction in the right occipital lobe. No contralateral diffusion abnormality. No acute hemorrhage. Early confluent hyperintense T2-weighted signal of the periventricular and deep white matter, most commonly due to chronic ischemic  microangiopathy. Normal volume of CSF spaces. No chronic microhemorrhage. Normal midline structures. Vascular: Normal flow voids. Skull and upper cervical spine: Normal marrow signal. Sinuses/Orbits: Negative. Other: None. MRA HEAD FINDINGS POSTERIOR CIRCULATION: --Vertebral arteries: Normal V4 segments. --Posterior inferior cerebellar arteries (PICA): Patent origins from the vertebral arteries. --Anterior inferior cerebellar arteries (AICA): Patent origins from the basilar artery. --Basilar artery: Multifocal moderate narrowing of the basilar artery may be exaggerated by motion. --Superior cerebellar arteries: Normal. --Posterior cerebral arteries: Normal. Both originate from the basilar artery. Posterior communicating arteries (p-comm) are diminutive or absent. ANTERIOR CIRCULATION: --Intracranial internal carotid arteries: Normal. --Anterior cerebral arteries (ACA): Azygos configuration. Both A1 segments are present. --Middle cerebral arteries (MCA): Apparent narrowing of the inferior division left M2 segment is likely due to motion. Otherwise normal. IMPRESSION: 1. 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 No hemorrhage or mass effect. 2. No emergent large vessel occlusion. 3. Multifocal moderate narrowing of the basilar artery is probably exaggerated by motion artifact. 4. Findings of chronic ischemic microangiopathy. Electronically Signed   By: Ulyses Jarred M.D.   On: 08/16/2019 21:46   MR BRAIN WO CONTRAST  Result Date: 08/16/2019 CLINICAL DATA:  Stroke follow-up. Status post tPA. EXAM: MRI HEAD WITHOUT CONTRAST MRA HEAD WITHOUT CONTRAST TECHNIQUE: Multiplanar, multiecho pulse sequences of the brain and surrounding structures were obtained without intravenous contrast. Angiographic images of the head were obtained using MRA technique without contrast. COMPARISON:  None. FINDINGS: MRI HEAD FINDINGS Brain: Multifocal  abnormal diffusion restriction within the right hemisphere, predominantly within the right frontal lobe and right insula. Small area diffusion restriction in the right occipital lobe. No contralateral diffusion abnormality. No acute hemorrhage. Early confluent hyperintense T2-weighted signal of the periventricular and deep white matter, most commonly due to chronic ischemic microangiopathy. Normal volume of CSF spaces. No chronic microhemorrhage. Normal midline structures. Vascular: Normal flow voids. Skull and upper cervical spine: Normal marrow signal. Sinuses/Orbits: Negative. Other: None. MRA HEAD FINDINGS POSTERIOR CIRCULATION: --Vertebral arteries: Normal V4 segments. --Posterior inferior cerebellar arteries (PICA): Patent origins from the vertebral arteries. --Anterior inferior cerebellar arteries (AICA): Patent origins from the basilar artery. --Basilar artery: Multifocal moderate narrowing of the basilar artery may be exaggerated by motion. --Superior cerebellar arteries: Normal. --Posterior cerebral arteries: Normal. Both originate from the basilar artery.  Posterior communicating arteries (p-comm) are diminutive or absent. ANTERIOR CIRCULATION: --Intracranial internal carotid arteries: Normal. --Anterior cerebral arteries (ACA): Azygos configuration. Both A1 segments are present. --Middle cerebral arteries (MCA): Apparent narrowing of the inferior division left M2 segment is likely due to motion. Otherwise normal. IMPRESSION: 1. 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 No hemorrhage or mass effect. 2. No emergent large vessel occlusion. 3. Multifocal moderate narrowing of the basilar artery is probably exaggerated by motion artifact. 4. Findings of chronic ischemic microangiopathy. Electronically Signed   By: Ulyses Jarred M.D.   On: 08/16/2019 21:46   CT Code Stroke Cerebral Perfusion with contrast  Result Date:  08/15/2019 CLINICAL DATA:  Right-sided gaze disturbance. Slurred speech. Left-sided weakness. EXAM: CT ANGIOGRAPHY HEAD AND NECK CT PERFUSION BRAIN TECHNIQUE: Multidetector CT imaging of the head and neck was performed using the standard protocol during bolus administration of intravenous contrast. Multiplanar CT image reconstructions and MIPs were obtained to evaluate the vascular anatomy. Carotid stenosis measurements (when applicable) are obtained utilizing NASCET criteria, using the distal internal carotid diameter as the denominator. Multiphase CT imaging of the brain was performed following IV bolus contrast injection. Subsequent parametric perfusion maps were calculated using RAPID software. CONTRAST:  133mL OMNIPAQUE IOHEXOL 350 MG/ML SOLN COMPARISON:  Head CT earlier same day. FINDINGS: CTA NECK FINDINGS Aortic arch: Aortic atherosclerosis. Branching pattern is normal without origin stenosis. Right carotid system: Common carotid artery widely patent to the bifurcation. Soft and calcified plaque at the carotid bifurcation and ICA bulb. Minimal diameter of the ICA bulb is 3.7 mm. Compared to a more distal cervical ICA diameter of 4.3 mm, this indicates a 15% stenosis. Left carotid system: Common carotid artery widely patent to the bifurcation. Calcified plaque at the carotid bifurcation and ICA bulb. Minimal diameter is 4.5 mm, the same as the more distal cervical ICA. Therefore there is no stenosis. The cervical ICA is markedly tortuous. Vertebral arteries: There is calcified plaque at both vertebral artery origins. Severe stenosis on the right, 80% or greater. Moderate stenosis on the left, 50-75%. Both vertebral arteries do show flow to the foramen magnum. Skeleton: Ordinary spondylosis. Other neck: Soft tissue lipoma of the anterior midline neck. Upper chest: Mild peripheral scarring on the right. Review of the MIP images confirms the above findings CTA HEAD FINDINGS Anterior circulation: Both internal  carotid arteries are patent through the skull base and siphon regions. There is siphon atherosclerotic calcification but without stenosis greater than 30%. No large or medium vessel occlusion identified on the left. There is a single azygos anterior cerebral artery supplying both anterior cerebral artery territories. On the right, there is some atherosclerotic irregularity of the M1 segment but no flow limiting stenosis. There is an occluded M3 branch serving the frontal operculum. Posterior circulation: Both vertebral arteries are patent through the foramen magnum to the basilar. Before atherosclerotic disease but without stenosis greater than 30%. 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 Superior cerebellar and posterior cerebral arteries show flow. Venous sinuses: Patent and normal. Anatomic variants: None significant. Review of the MIP images confirms the above findings CT Brain Perfusion Findings: ASPECTS: 10 Perfusion data is not reliable. In looking at the source data, there does appear to be a small region of diminished cerebral blood volume in the right frontal operculum, estimated at about 15 cc. There is  also diminished cerebral blood flow and delayed mean transit time in that region. 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 Perfusion data is unreliable because of motion. Viewing the source images, the patient does demonstrate a region of diminished cerebral blood volume, cerebral blood flow and prolonged mean transit time in the frontal operculum ir region, affecting about 15-20 cc of brain. Atherosclerotic disease at the aortic arch. Atherosclerotic disease at both carotid bifurcations. 15% ICA stenosis on the right. No stenosis on the left. Atherosclerotic disease in the carotid siphon regions but without stenosis greater than 30%. Stenosis of both vertebral artery  origins, severe on the right and moderate on the left. 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 Findings discussed with Dr. Rory Percy during performance. Electronically Signed   By: Nelson Chimes M.D.   On: 08/15/2019 20:56   DG CHEST PORT 1 VIEW  Result Date: 08/19/2019 CLINICAL DATA:  COVID-19 infection. EXAM: PORTABLE CHEST 1 VIEW COMPARISON:  Lung apices of neck CTA 08/15/2019 FINDINGS: Post median sternotomy. Mild cardiomegaly. Heterogeneous patchy opacity at the right lung base. Additional peripheral opacities in the upper lobes on neck CT a not well visualized radiographically. No pulmonary edema. No pleural fluid or pneumothorax. BBs project over the left chest wall. IMPRESSION: 1. Heterogeneous patchy opacity at the right lung base may represent atelectasis or pneumonia in the setting of COVID infection. 2. Mild cardiomegaly post median sternotomy. Electronically Signed   By: Keith Rake M.D.   On: 08/19/2019 01:39   ECHOCARDIOGRAM COMPLETE  Result Date: 08/16/2019    ECHOCARDIOGRAM REPORT   Patient Name:   GIOACCHINO LAESSIG Date of Exam: 08/16/2019 Medical Rec #:  [id number]        Height:       70.0 in Accession #:    [id number]       Weight:       206.3 lb Date of Birth:  1930/04/13        BSA:          2.12 m Patient Age:    48 years         BP:           113/96 mmHg Patient Gender: M                HR:           77 bpm. Exam Location:  Inpatient Procedure: 2D Echo, Cardiac Doppler and Color Doppler Indications:    Stroke 434.91 / I163.9  History:        Patient has no prior history of Echocardiogram examinations.                 CAD, Arrythmias:Atrial Fibrillation; Risk Factors:Hypertension.                 COVID-19 Positive.  Sonographer:    Jonelle Sidle Dance Referring Phys: [id number] ASHISH ARORA IMPRESSIONS  1. Left ventricular ejection fraction, by estimation, is 45 to 50%. The left ventricle has mildly decreased function. The left ventricle demonstrates global hypokinesis. The left  ventricular internal cavity size was mildly dilated. Left ventricular diastolic function could not be evaluated.  2. Right ventricular systolic function is mildly reduced. The right ventricular size is normal. There is normal pulmonary artery systolic pressure. The estimated right ventricular systolic pressure is 123456 mmHg.  3. Left atrial size was severely dilated.  4. The mitral valve is degenerative. Mild to moderate mitral valve regurgitation.  5. The aortic valve  is tricuspid. Aortic valve regurgitation is moderate. No aortic stenosis is present.  6. The inferior vena cava is normal in size with greater than 50% respiratory variability, suggesting right atrial pressure of 3 mmHg. Comparison(s): No prior Echocardiogram. FINDINGS  Left Ventricle: Left ventricular ejection fraction, by estimation, is 45 to 50%. The left ventricle has mildly decreased function. The left ventricle demonstrates global hypokinesis. The left ventricular internal cavity size was mildly dilated. There is  no left ventricular hypertrophy. The left ventricular diastology could not be evaluated due to atrial fibrillation. Left ventricular diastolic function could not be evaluated. Right Ventricle: The right ventricular size is normal. No increase in right ventricular wall thickness. Right ventricular systolic function is mildly reduced. There is normal pulmonary artery systolic pressure. The tricuspid regurgitant velocity is 2.64 m/s, and with an assumed right atrial pressure of 3 mmHg, the estimated right ventricular systolic pressure is 123456 mmHg. Left Atrium: Left atrial size was severely dilated. Right Atrium: Right atrial size was normal in size. Pericardium: There is no evidence of pericardial effusion. Mitral Valve: The mitral valve is degenerative in appearance. Mild mitral annular calcification. Mild to moderate mitral valve regurgitation. Tricuspid Valve: The tricuspid valve is grossly normal. Tricuspid valve regurgitation is  mild. Aortic Valve: The aortic valve is tricuspid. Aortic valve regurgitation is moderate. Aortic regurgitation PHT measures 771 msec. No aortic stenosis is present. Pulmonic Valve: The pulmonic valve was grossly normal. Pulmonic valve regurgitation is trivial. Aorta: The aortic root and ascending aorta are structurally normal, with no evidence of dilitation. Venous: The inferior vena cava is normal in size with greater than 50% respiratory variability, suggesting right atrial pressure of 3 mmHg. IAS/Shunts: No atrial level shunt detected by color flow Doppler.  LEFT VENTRICLE PLAX 2D LVIDd:         6.20 cm LVIDs:         4.47 cm LV PW:         1.20 cm LV IVS:        0.89 cm LVOT diam:     2.30 cm LV SV:         55.81 ml LV SV Index:   47.40 LVOT Area:     4.15 cm  RIGHT VENTRICLE          IVC RV Basal diam:  3.33 cm  IVC diam: 1.60 cm RV Mid diam:    2.36 cm TAPSE (M-mode): 1.1 cm LEFT ATRIUM              Index       RIGHT ATRIUM           Index LA diam:        4.90 cm  2.32 cm/m  RA Area:     21.80 cm LA Vol (A2C):   147.0 ml 69.50 ml/m RA Volume:   72.20 ml  34.13 ml/m LA Vol (A4C):   90.3 ml  42.69 ml/m LA Biplane Vol: 116.0 ml 54.84 ml/m  AORTIC VALVE LVOT Vmax:   83.00 cm/s LVOT Vmean:  50.267 cm/s LVOT VTI:    0.134 m AI PHT:      771 msec  AORTA Ao Root diam: 3.90 cm Ao Asc diam:  3.90 cm MITRAL VALVE               TRICUSPID VALVE MV Area (PHT): 3.34 cm    TR Peak grad:   27.9 mmHg MV Decel Time: 227 msec    TR Vmax:  264.00 cm/s MV E velocity: 80.30 cm/s                            SHUNTS                            Systemic VTI:  0.13 m                            Systemic Diam: 2.30 cm Eleonore Chiquito MD Electronically signed by Eleonore Chiquito MD Signature Date/Time: 08/16/2019/4:00:11 PM    Final    CT HEAD CODE STROKE WO CONTRAST  Result Date: 08/15/2019 CLINICAL DATA:  Code stroke. Right-sided gaze disturbance. Slurred speech. Left-sided weakness. EXAM: CT HEAD WITHOUT CONTRAST TECHNIQUE:  Contiguous axial images were obtained from the base of the skull through the vertex without intravenous contrast. COMPARISON:  None. FINDINGS: Brain: Age related generalized volume loss. Chronic small-vessel ischemic changes affect the cerebral hemispheric white matter. Focal low-density in the external capsule regions bilaterally consistent chronic small vessel change. No sign of acute cortical infarction, mass lesion, hemorrhage, hydrocephalus or extra-axial collection. Vascular: There is atherosclerotic calcification of the major vessels at the base of the brain. Skull: Negative Sinuses/Orbits: Clear/normal Other: None ASPECTS ([place] Stroke Program Early CT Score) - Ganglionic level infarction (caudate, lentiform nuclei, internal capsule, insula, M1-M3 cortex): 7 - Supraganglionic infarction (M4-M6 cortex): 3 Total score (0-10 with 10 being normal): 10 IMPRESSION: 1. No acute finding by CT. Age related atrophy. Chronic small-vessel ischemic changes throughout the brain as above. 2. ASPECTS is 10. 3. These results were communicated to Dr. Rory Percy at 7:53 pmon 2/14/2021by text page via the [facility] messaging system. Electronically Signed   By: Nelson Chimes M.D.   On: 08/15/2019 19:54

## 2019-08-19 NOTE — Progress Notes (Signed)
ANTICOAGULATION CONSULT NOTE - follow up Pharmacy Consult for heparin Indication: atrial fibrillation  No Known Allergies  Patient Measurements: Height: 6' (182.9 cm) Weight: 206 lb 5.6 oz (93.6 kg) IBW/kg (Calculated) : 77.6 Heparin Dosing Weight: 93.6 kg  Vital Signs: Temp: 97.2 F (36.2 C) (02/18 0840) Temp Source: Oral (02/18 0840) BP: 144/75 (02/18 0840) Pulse Rate: 77 (02/18 0840)  Labs: Recent Labs    08/17/19 0457 08/17/19 0457 08/18/19 2150 08/19/19 0458 08/19/19 1141  HGB 14.5   < > 13.8 14.1  --   HCT 43.4  --  42.2 42.9  --   PLT 279  --  351 322  --   APTT  --   --   --   --  101*  HEPARINUNFRC  --   --   --   --  0.75*  CREATININE 1.52*  --  1.82* 1.69*  --    < > = values in this interval not displayed.    Estimated Creatinine Clearance: 36.6 mL/min (A) (by C-G formula based on SCr of 1.69 mg/dL (H)).   Medical History: Past Medical History:  Diagnosis Date  . A-fib ([place])   . Coronary artery disease   . Hypertension     Medications:  See medication history  Assessment: 86 yo male started on heparin drip for afib.  He was on eliquis PTA.   8  hour APTT =101, therapeutic on hep 1200/hr, HL 0.75 d/t recent Eliquis , LD given 2/16 @1600  Using aPTT to monitor heparin until HL correlate with aPTT, due to effect of eliquis on heparin levels. No bleeding reported , CBC wnl/stable   Goal of Therapy:  APTT 66-102 sec Heparin level 0.3-0.7 units/ml Monitor platelets by anticoagulation protocol: Yes   Plan:  Continue IV heparin drip at 1200 units/hr Check aPTT in 6 hours to confirm remains therapeutic.  APTT and HL daily CBC daily Monitor for bleeding complications  Nicole Cella, RPh Clinical Pharmacist 08/19/2019,1:35 PM

## 2019-08-19 NOTE — ED Notes (Signed)
SDU  Breakfast ordered  

## 2019-08-19 NOTE — Progress Notes (Addendum)
@   2030:  Heparin drip rate change from 12 to 13.  @ 2240:  Patient has had many PVCs.  CCMD reported ventricular bigeminy, this is regularly visible on the telemetry monitors.  @ 0450:  Heart rate bradycardic frequently 40s-30s while sleeping.  @ 0630:  Heparin drip rate change from 13 to 12.5.

## 2019-08-19 NOTE — Progress Notes (Signed)
Pt ambulated in room w/ FWW. Has no c/o pain, chest pain, nausea or weakness. HR in 80's-90s while ambulating.

## 2019-08-20 ENCOUNTER — Encounter (HOSPITAL_COMMUNITY): Payer: Self-pay | Admitting: Family Medicine

## 2019-08-20 DIAGNOSIS — I4819 Other persistent atrial fibrillation: Secondary | ICD-10-CM

## 2019-08-20 DIAGNOSIS — U071 COVID-19: Secondary | ICD-10-CM

## 2019-08-20 DIAGNOSIS — R001 Bradycardia, unspecified: Principal | ICD-10-CM

## 2019-08-20 LAB — COMPREHENSIVE METABOLIC PANEL
ALT: 45 U/L — ABNORMAL HIGH (ref 0–44)
AST: 41 U/L (ref 15–41)
Albumin: 2.5 g/dL — ABNORMAL LOW (ref 3.5–5.0)
Alkaline Phosphatase: 51 U/L (ref 38–126)
Anion gap: 11 (ref 5–15)
BUN: 16 mg/dL (ref 8–23)
CO2: 22 mmol/L (ref 22–32)
Calcium: 9.1 mg/dL (ref 8.9–10.3)
Chloride: 105 mmol/L (ref 98–111)
Creatinine, Ser: 1.51 mg/dL — ABNORMAL HIGH (ref 0.61–1.24)
GFR calc Af Amer: 47 mL/min — ABNORMAL LOW (ref >60)
GFR calc non Af Amer: 41 mL/min — ABNORMAL LOW (ref >60)
Glucose, Bld: 88 mg/dL (ref 70–99)
Potassium: 4.5 mmol/L (ref 3.5–5.1)
Sodium: 138 mmol/L (ref 135–145)
Total Bilirubin: 0.9 mg/dL (ref 0.3–1.2)
Total Protein: 6.4 g/dL — ABNORMAL LOW (ref 6.5–8.1)

## 2019-08-20 LAB — CBC WITH DIFFERENTIAL/PLATELET
Abs Immature Granulocytes: 0 10*3/uL (ref 0.00–0.07)
Basophils Absolute: 0 10*3/uL (ref 0.0–0.1)
Basophils Relative: 0 %
Eosinophils Absolute: 0 10*3/uL (ref 0.0–0.5)
Eosinophils Relative: 0 %
HCT: 44.9 % (ref 39.0–52.0)
Hemoglobin: 14.5 g/dL (ref 13.0–17.0)
Lymphocytes Relative: 32 %
Lymphs Abs: 2.2 10*3/uL (ref 0.7–4.0)
MCH: 30.5 pg (ref 26.0–34.0)
MCHC: 32.3 g/dL (ref 30.0–36.0)
MCV: 94.3 fL (ref 80.0–100.0)
Monocytes Absolute: 0.2 10*3/uL (ref 0.1–1.0)
Monocytes Relative: 3 %
Neutro Abs: 4.5 10*3/uL (ref 1.7–7.7)
Neutrophils Relative %: 65 %
Platelets: 353 10*3/uL (ref 150–400)
RBC: 4.76 MIL/uL (ref 4.22–5.81)
RDW: 13.1 % (ref 11.5–15.5)
WBC: 6.9 10*3/uL (ref 4.0–10.5)
nRBC: 0 % (ref 0.0–0.2)
nRBC: 0 /100 WBC

## 2019-08-20 LAB — HEPARIN LEVEL (UNFRACTIONATED)
Heparin Unfractionated: 0.89 IU/mL — ABNORMAL HIGH (ref 0.30–0.70)
Heparin Unfractionated: 0.7 IU/mL (ref 0.30–0.70)

## 2019-08-20 LAB — APTT
aPTT: 116 seconds — ABNORMAL HIGH (ref 24–36)
aPTT: 89 seconds — ABNORMAL HIGH (ref 24–36)

## 2019-08-20 NOTE — Consult Note (Addendum)
Cardiology Consultation:   Due to the COVID-19 pandemic, this visit was completed with telemedicine (audio/video) technology to reduce patient and provider exposure as well as to preserve personal protective equipment.   Patient ID: Dillon Smith; [id number]; 09/26/1929   Admit date: 08/18/2019 Date of Consult: 08/20/2019  Primary Care Provider: [facility], [name] Primary Cardiologist: No primary care provider on file. New Primary Electrophysiologist:  None   Patient Profile:   Dillon Smith is a 86 y.o. male with a hx of HTN, HLD, CKD III, prostate CA, Afib (not anticoagulated), CVA 08/15/2019, CAD (no details available), who is being seen today for the evaluation of bradycardia in the setting of COVID at the request of Dr Candiss Norse.  History of Present Illness:   Dillon Smith was hospitalized 02/14-02/16/2021 with acute CVA, multiple R MCA & PCA embolic infarcts felt 2nd Afib (prev not anticoag). R M3 occlusion, bilat ICA atherosclerosis (not obstructive), severe R VA & mod L VA dz, BA > 80%. Treated with TPA, d/c on Eliquis 2.5 mg bid and metoprolol 50 mg bid (he had been on this prior to admit).     Dillon Smith was COVID+ on admission 02/14.  He was admitted 02/17 with confusion and vomiting. Transient bradycardia noted by EMS, in the setting of vomiting, no symptoms attributed to the low HR.  In the ER, HR continued to drop into the 30s at times, Afib w/ PVCs. Metoprolol held on admission.   His HR increased appropriately w/ ambulation, so no evidence of chronotropic incompetence.   However, HR continued to drop into the 40s at times, despite being off BB for >24 hr, cards asked to see. Pt has been in Afib since admission, no documented plans for trying to re  Upon review of telemetry, pt HR drops into the 50s regularly and into the 40s occasionally, mostly while asleep. Pt also has frequent PVCs and bigeminy at times. Some compensatory pauses after PVCs are > 2 sec, but none  are > 3 sec.   HR recorded as 30s at times, but that is only for a few seconds.   No symptoms are documented in association with the low heart rates.     Past Medical History:  Diagnosis Date  . A-fib ([place])   . Coronary artery disease   . Hypertension   . Prostate cancer ([place]) 2010    Past Surgical History:  Procedure Laterality Date  . COLONOSCOPY  2004  . INSERTION PROSTATE RADIATION SEED  2010     Prior to Admission medications   Medication Sig Start Date End Date Taking? Authorizing Provider  amLODipine (NORVASC) 10 MG tablet Take 10 mg by mouth daily. 06/01/19  Yes [provider]  apixaban (ELIQUIS) 2.5 MG TABS tablet Take 1 tablet (2.5 mg total) by mouth 2 (two) times daily. 08/17/19  Yes Garvin Fila, MD  atorvastatin (LIPITOR) 40 MG tablet Take 1 tablet (40 mg total) by mouth at bedtime. 08/17/19  Yes Garvin Fila, MD  metoprolol tartrate (LOPRESSOR) 50 MG tablet Take 50 mg by mouth 2 (two) times daily. 06/02/19  Yes [provider]    Inpatient Medications: Scheduled Meds: . amLODipine  10 mg Oral Daily  . atorvastatin  40 mg Oral q1800  . pantoprazole  40 mg Oral Daily   Continuous Infusions: . heparin 1,250 Units/hr (08/20/19 0644)   PRN Meds: acetaminophen, [DISCONTINUED] ondansetron **OR** ondansetron (ZOFRAN) IV, polyethylene glycol  Allergies:   No Known Allergies  Social History:  Social History   Socioeconomic History  . Marital status: Divorced    Spouse name: Not on file  . Number of children: Not on file  . Years of education: Not on file  . Highest education level: Not on file  Occupational History  . Not on file  Tobacco Use  . Smoking status: Former Research scientist (life sciences)  . Smokeless tobacco: Never Used  Substance and Sexual Activity  . Alcohol use: Not on file  . Drug use: Not on file  . Sexual activity: Not on file  Other Topics Concern  . Not on file  Social History Narrative  . Not on file   Social Determinants of  Health   Financial Resource Strain:   . Difficulty of Paying Living Expenses: Not on file  Food Insecurity:   . Worried About Charity fundraiser in the Last Year: Not on file  . Ran Out of Food in the Last Year: Not on file  Transportation Needs:   . Lack of Transportation (Medical): Not on file  . Lack of Transportation (Non-Medical): Not on file  Physical Activity:   . Days of Exercise per Week: Not on file  . Minutes of Exercise per Session: Not on file  Stress:   . Feeling of Stress : Not on file  Social Connections:   . Frequency of Communication with Friends and Family: Not on file  . Frequency of Social Gatherings with Friends and Family: Not on file  . Attends Religious Services: Not on file  . Active Member of Clubs or Organizations: Not on file  . Attends Archivist Meetings: Not on file  . Marital Status: Not on file  Intimate Partner Violence:   . Fear of Current or Ex-Partner: Not on file  . Emotionally Abused: Not on file  . Physically Abused: Not on file  . Sexually Abused: Not on file    Family History:   History reviewed. No pertinent family history. Family Status:  Family Status  Relation Name Status  . Mother  Deceased  . Father  Deceased    ROS:  Please see the history of present illness.  All other ROS reviewed and negative.     Physical Exam/Data:   Vitals:   08/20/19 0537 08/20/19 0740 08/20/19 0741 08/20/19 1131  BP: (!) 155/67  130/65 120/85  Pulse: (!) 41 (!) 59    Resp: 12  17 20   Temp: (!) 97.3 F (36.3 C)  (!) 97.5 F (36.4 C) (!) 97.4 F (36.3 C)  TempSrc: Oral  Oral Axillary  SpO2: 100%  98% 92%  Weight:      Height:        Intake/Output Summary (Last 24 hours) at 08/20/2019 1145 Last data filed at 08/20/2019 [id number] Gross per 24 hour  Intake 266.46 ml  Output 875 ml  Net -608.54 ml   Filed Weights   08/18/19 2200  Weight: 93.6 kg   Body mass index is 27.99 kg/m.  Consult done virtually, no physical exam  performed.  EKG:  The EKG was personally reviewed and demonstrates:  02/17 ECG is atrial fib, HR 63, PVCs seen, no acute ischemic changes Telemetry:  Telemetry was personally reviewed and demonstrates:  Atrial fib, slow VR at times, frequent PVCs and bigeminy at times, compensatory pause after PVCs up to 2.8 sec but none > 3 sec, HR not sustained < 45.    CV studies:   ECHO: 08/16/2019 1. Left ventricular ejection fraction, by estimation, is 45  to 50%. The  left ventricle has mildly decreased function. The left ventricle  demonstrates global hypokinesis. The left ventricular internal cavity size  was mildly dilated. Left ventricular  diastolic function could not be evaluated.  2. Right ventricular systolic function is mildly reduced. The right  ventricular size is normal. There is normal pulmonary artery systolic  pressure. The estimated right ventricular systolic pressure is 123456 mmHg.  3. Left atrial size was severely dilated.  4. The mitral valve is degenerative. Mild to moderate mitral valve  regurgitation.  5. The aortic valve is tricuspid. Aortic valve regurgitation is moderate.  No aortic stenosis is present.  6. The inferior vena cava is normal in size with greater than 50%  respiratory variability, suggesting right atrial pressure of 3 mmHg.    Laboratory Data:   Chemistry Recent Labs  Lab 08/18/19 2150 08/19/19 0458 08/20/19 0343  NA 137 137 138  K 4.6 5.1 4.5  CL 107 104 105  CO2 19* 21* 22  GLUCOSE 154* 141* 88  BUN 25* 21 16  CREATININE 1.82* 1.69* 1.51*  CALCIUM 8.7* 8.9 9.1  GFRNONAA 33* 36* 41*  GFRAA 38* 41* 47*  ANIONGAP 11 12 11     Lab Results  Component Value Date   ALT 45 (H) 08/20/2019   AST 41 08/20/2019   ALKPHOS 51 08/20/2019   BILITOT 0.9 08/20/2019   Hematology Recent Labs  Lab 08/18/19 2150 08/19/19 0458 08/20/19 0343  WBC 6.0 6.2 6.9  RBC 4.51 4.53 4.76  HGB 13.8 14.1 14.5  HCT 42.2 42.9 44.9  MCV 93.6 94.7 94.3  MCH  30.6 31.1 30.5  MCHC 32.7 32.9 32.3  RDW 13.2 13.1 13.1  PLT 351 322 353    TSH:  Lab Results  Component Value Date   TSH 2.736 08/19/2019   Lipids: Lab Results  Component Value Date   CHOL 146 08/16/2019   HDL 23 (L) 08/16/2019   LDLCALC 101 (H) 08/16/2019   TRIG 109 08/16/2019   CHOLHDL 6.3 08/16/2019   HgbA1c: Lab Results  Component Value Date   HGBA1C 6.3 (H) 08/16/2019   Magnesium:  Magnesium  Date Value Ref Range Status  08/19/2019 2.3 1.7 - 2.4 mg/dL Final    Comment:    Performed at [facility], [place] [address] [place], [place] [address]     Radiology/Studies:  DG Abd 1 View  Result Date: 08/19/2019 CLINICAL DATA:  Nausea. EXAM: ABDOMEN - 1 VIEW COMPARISON:  None. FINDINGS: The left lung base appears clear. The bowel gas pattern is unremarkable. No findings for obstruction or perforation. Calcifications in the right abdomen adjacent to the L1 transverse process could reflect renal artery calcifications. No obvious renal or ureteral calculi. Brachytherapy seeds are noted in the prostate gland. The bony structures are intact. IMPRESSION: No plain film findings for an acute abdominal process. Electronically Signed   By: Marijo Sanes M.D.   On: 08/19/2019 12:20   CT Head Wo Contrast  Result Date: 08/18/2019 CLINICAL DATA:  86 year old male with neurologic deficit. EXAM: CT HEAD WITHOUT CONTRAST TECHNIQUE: Contiguous axial images were obtained from the base of the skull through the vertex without intravenous contrast. COMPARISON:  Head CT dated 08/16/2019. FINDINGS: Brain: The ventricles and sulci appropriate size for patient's age. Mild periventricular and deep white matter chronic microvascular ischemic changes noted. There is no acute intracranial hemorrhage. No mass effect or midline shift. No extra-axial fluid collection. Vascular: No hyperdense vessel or unexpected calcification. Skull: Normal. Negative  for fracture or focal lesion. Sinuses/Orbits: Mild  mucoperiosteal thickening of paranasal sinuses with opacification of several ethmoid air cells. No air-fluid level. The mastoid air cells are clear. Other: None IMPRESSION: 1. No acute intracranial hemorrhage. 2. Age-related atrophy and chronic microvascular ischemic changes. Electronically Signed   By: Anner Crete M.D.   On: 08/18/2019 22:47   CT HEAD WO CONTRAST  Result Date: 08/16/2019 CLINICAL DATA:  86 year old male 24 hours status post tPA for code stroke presentation. Right M3 branch occlusion suspected on CTA yesterday. EXAM: CT HEAD WITHOUT CONTRAST TECHNIQUE: Contiguous axial images were obtained from the base of the skull through the vertex without intravenous contrast. COMPARISON:  CTA head and neck 08/15/2019 and earlier. FINDINGS: Brain: No intracranial mass effect. No acute intracranial hemorrhage identified. No definite acute or evolving cortical infarct identified in the right hemisphere; hypodense area on series 3, image 20 appears to be a sulcus. And patchy bilateral white matter hypodensity has not definitely changed. Stable gray-white matter differentiation elsewhere. Normal basilar cisterns. No midline shift. Stable ventricle size and configuration. Vascular: Calcified atherosclerosis at the skull base. No suspicious intracranial vascular hyperdensity. Skull: No acute osseous abnormality identified. Sinuses/Orbits: Visualized paranasal sinuses and mastoids are stable and well pneumatized. Other: Resolved gaze deviation. No acute orbit or scalp soft tissue finding. IMPRESSION: 1. No acute or evolving cerebral infarct identified by CT compared to yesterday. 2. No intracranial hemorrhage or mass effect. Electronically Signed   By: Genevie Ann M.D.   On: 08/16/2019 18:23   Dillon ANGIO HEAD WO CONTRAST  Result Date: 08/16/2019 CLINICAL DATA:  Stroke follow-up. Status post tPA. EXAM: MRI HEAD WITHOUT CONTRAST MRA HEAD WITHOUT CONTRAST TECHNIQUE: Multiplanar, multiecho pulse sequences of the  brain and surrounding structures were obtained without intravenous contrast. Angiographic images of the head were obtained using MRA technique without contrast. COMPARISON:  None. FINDINGS: MRI HEAD FINDINGS Brain: Multifocal abnormal diffusion restriction within the right hemisphere, predominantly within the right frontal lobe and right insula. Small area diffusion restriction in the right occipital lobe. No contralateral diffusion abnormality. No acute hemorrhage. Early confluent hyperintense T2-weighted signal of the periventricular and deep white matter, most commonly due to chronic ischemic microangiopathy. Normal volume of CSF spaces. No chronic microhemorrhage. Normal midline structures. Vascular: Normal flow voids. Skull and upper cervical spine: Normal marrow signal. Sinuses/Orbits: Negative. Other: None. MRA HEAD FINDINGS POSTERIOR CIRCULATION: --Vertebral arteries: Normal V4 segments. --Posterior inferior cerebellar arteries (PICA): Patent origins from the vertebral arteries. --Anterior inferior cerebellar arteries (AICA): Patent origins from the basilar artery. --Basilar artery: Multifocal moderate narrowing of the basilar artery may be exaggerated by motion. --Superior cerebellar arteries: Normal. --Posterior cerebral arteries: Normal. Both originate from the basilar artery. Posterior communicating arteries (p-comm) are diminutive or absent. ANTERIOR CIRCULATION: --Intracranial internal carotid arteries: Normal. --Anterior cerebral arteries (ACA): Azygos configuration. Both A1 segments are present. --Middle cerebral arteries (MCA): Apparent narrowing of the inferior division left M2 segment is likely due to motion. Otherwise normal. IMPRESSION: 1. 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 No hemorrhage or mass effect. 2. No emergent large vessel occlusion. 3. Multifocal moderate narrowing of the basilar artery is  probably exaggerated by motion artifact. 4. Findings of chronic ischemic microangiopathy. Electronically Signed   By: Ulyses Jarred M.D.   On: 08/16/2019 21:46   Dillon BRAIN WO CONTRAST  Result Date: 08/16/2019 CLINICAL DATA:  Stroke follow-up. Status post tPA. EXAM: MRI HEAD WITHOUT CONTRAST  MRA HEAD WITHOUT CONTRAST TECHNIQUE: Multiplanar, multiecho pulse sequences of the brain and surrounding structures were obtained without intravenous contrast. Angiographic images of the head were obtained using MRA technique without contrast. COMPARISON:  None. FINDINGS: MRI HEAD FINDINGS Brain: Multifocal abnormal diffusion restriction within the right hemisphere, predominantly within the right frontal lobe and right insula. Small area diffusion restriction in the right occipital lobe. No contralateral diffusion abnormality. No acute hemorrhage. Early confluent hyperintense T2-weighted signal of the periventricular and deep white matter, most commonly due to chronic ischemic microangiopathy. Normal volume of CSF spaces. No chronic microhemorrhage. Normal midline structures. Vascular: Normal flow voids. Skull and upper cervical spine: Normal marrow signal. Sinuses/Orbits: Negative. Other: None. MRA HEAD FINDINGS POSTERIOR CIRCULATION: --Vertebral arteries: Normal V4 segments. --Posterior inferior cerebellar arteries (PICA): Patent origins from the vertebral arteries. --Anterior inferior cerebellar arteries (AICA): Patent origins from the basilar artery. --Basilar artery: Multifocal moderate narrowing of the basilar artery may be exaggerated by motion. --Superior cerebellar arteries: Normal. --Posterior cerebral arteries: Normal. Both originate from the basilar artery. Posterior communicating arteries (p-comm) are diminutive or absent. ANTERIOR CIRCULATION: --Intracranial internal carotid arteries: Normal. --Anterior cerebral arteries (ACA): Azygos configuration. Both A1 segments are present. --Middle cerebral arteries (MCA):  Apparent narrowing of the inferior division left M2 segment is likely due to motion. Otherwise normal. IMPRESSION: 1. 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 No hemorrhage or mass effect. 2. No emergent large vessel occlusion. 3. Multifocal moderate narrowing of the basilar artery is probably exaggerated by motion artifact. 4. Findings of chronic ischemic microangiopathy. Electronically Signed   By: Ulyses Jarred M.D.   On: 08/16/2019 21:46   DG CHEST PORT 1 VIEW  Result Date: 08/19/2019 CLINICAL DATA:  COVID-19 infection. EXAM: PORTABLE CHEST 1 VIEW COMPARISON:  Lung apices of neck CTA 08/15/2019 FINDINGS: Post median sternotomy. Mild cardiomegaly. Heterogeneous patchy opacity at the right lung base. Additional peripheral opacities in the upper lobes on neck CT a not well visualized radiographically. No pulmonary edema. No pleural fluid or pneumothorax. BBs project over the left chest wall. IMPRESSION: 1. Heterogeneous patchy opacity at the right lung base may represent atelectasis or pneumonia in the setting of COVID infection. 2. Mild cardiomegaly post median sternotomy. Electronically Signed   By: Keith Rake M.D.   On: 08/19/2019 01:39   ECHOCARDIOGRAM COMPLETE  Result Date: 08/16/2019    ECHOCARDIOGRAM REPORT   Patient Name:   Dillon Smith Date of Exam: 08/16/2019 Medical Rec #:  [id number]        Height:       70.0 in Accession #:    [id number]       Weight:       206.3 lb Date of Birth:  11/29/1929        BSA:          2.12 m Patient Age:    40 years         BP:           113/96 mmHg Patient Gender: M                HR:           77 bpm. Exam Location:  Inpatient Procedure: 2D Echo, Cardiac Doppler and Color Doppler Indications:    Stroke 434.91 / I163.9  History:        Patient has no prior history of Echocardiogram examinations.  CAD, Arrythmias:Atrial Fibrillation; Risk  Factors:Hypertension.                 COVID-19 Positive.  Sonographer:    Jonelle Sidle Dance Referring Phys: [id number] ASHISH ARORA IMPRESSIONS  1. Left ventricular ejection fraction, by estimation, is 45 to 50%. The left ventricle has mildly decreased function. The left ventricle demonstrates global hypokinesis. The left ventricular internal cavity size was mildly dilated. Left ventricular diastolic function could not be evaluated.  2. Right ventricular systolic function is mildly reduced. The right ventricular size is normal. There is normal pulmonary artery systolic pressure. The estimated right ventricular systolic pressure is 123456 mmHg.  3. Left atrial size was severely dilated.  4. The mitral valve is degenerative. Mild to moderate mitral valve regurgitation.  5. The aortic valve is tricuspid. Aortic valve regurgitation is moderate. No aortic stenosis is present.  6. The inferior vena cava is normal in size with greater than 50% respiratory variability, suggesting right atrial pressure of 3 mmHg. Comparison(s): No prior Echocardiogram. FINDINGS  Left Ventricle: Left ventricular ejection fraction, by estimation, is 45 to 50%. The left ventricle has mildly decreased function. The left ventricle demonstrates global hypokinesis. The left ventricular internal cavity size was mildly dilated. There is  no left ventricular hypertrophy. The left ventricular diastology could not be evaluated due to atrial fibrillation. Left ventricular diastolic function could not be evaluated. Right Ventricle: The right ventricular size is normal. No increase in right ventricular wall thickness. Right ventricular systolic function is mildly reduced. There is normal pulmonary artery systolic pressure. The tricuspid regurgitant velocity is 2.64 m/s, and with an assumed right atrial pressure of 3 mmHg, the estimated right ventricular systolic pressure is 123456 mmHg. Left Atrium: Left atrial size was severely dilated. Right Atrium: Right atrial  size was normal in size. Pericardium: There is no evidence of pericardial effusion. Mitral Valve: The mitral valve is degenerative in appearance. Mild mitral annular calcification. Mild to moderate mitral valve regurgitation. Tricuspid Valve: The tricuspid valve is grossly normal. Tricuspid valve regurgitation is mild. Aortic Valve: The aortic valve is tricuspid. Aortic valve regurgitation is moderate. Aortic regurgitation PHT measures 771 msec. No aortic stenosis is present. Pulmonic Valve: The pulmonic valve was grossly normal. Pulmonic valve regurgitation is trivial. Aorta: The aortic root and ascending aorta are structurally normal, with no evidence of dilitation. Venous: The inferior vena cava is normal in size with greater than 50% respiratory variability, suggesting right atrial pressure of 3 mmHg. IAS/Shunts: No atrial level shunt detected by color flow Doppler.  LEFT VENTRICLE PLAX 2D LVIDd:         6.20 cm LVIDs:         4.47 cm LV PW:         1.20 cm LV IVS:        0.89 cm LVOT diam:     2.30 cm LV SV:         55.81 ml LV SV Index:   47.40 LVOT Area:     4.15 cm  RIGHT VENTRICLE          IVC RV Basal diam:  3.33 cm  IVC diam: 1.60 cm RV Mid diam:    2.36 cm TAPSE (M-mode): 1.1 cm LEFT ATRIUM              Index       RIGHT ATRIUM           Index LA diam:        4.90 cm  2.32 cm/m  RA Area:     21.80 cm LA Vol (A2C):   147.0 ml 69.50 ml/m RA Volume:   72.20 ml  34.13 ml/m LA Vol (A4C):   90.3 ml  42.69 ml/m LA Biplane Vol: 116.0 ml 54.84 ml/m  AORTIC VALVE LVOT Vmax:   83.00 cm/s LVOT Vmean:  50.267 cm/s LVOT VTI:    0.134 m AI PHT:      771 msec  AORTA Ao Root diam: 3.90 cm Ao Asc diam:  3.90 cm MITRAL VALVE               TRICUSPID VALVE MV Area (PHT): 3.34 cm    TR Peak grad:   27.9 mmHg MV Decel Time: 227 msec    TR Vmax:        264.00 cm/s MV E velocity: 80.30 cm/s                            SHUNTS                            Systemic VTI:  0.13 m                            Systemic Diam: 2.30 cm  Eleonore Chiquito MD Electronically signed by Eleonore Chiquito MD Signature Date/Time: 08/16/2019/4:00:11 PM    Final     Assessment and Plan:   1. Bradycardia, Afib w/ slow VR - asymptomatic - HR not sustained below 45 - slow rates are mostly while asleep  - BB d/c'd - Continue to follow for symptoms - currently no indication for PPM - no plans to restore SR, severe LA dilatation might make it difficult to hold SR and he has no sx from the Afib - continue anticoag for CHA2DS2-VASc = 6 (age x 2, HTN, CVA x 2, CAD) - will arrange f/u appt  2. Frequent PVCs and episodes of bigeminy - K+ 4.5 and Mg 2.3, electrolytes not the cause - TSH normal - follow for symptoms  3. Abnormal echo - mild LVD w/ no WMA, mild RV dysfunction as well - severe LA dilatation, RA normal - mod Dillon, mod AI  Otherwise, per IM Principal Problem:   Bradycardia Active Problems:   Essential hypertension   Coronary artery disease   COVID-19 virus infection   Atrial fibrillation (HCC)   Chronic kidney disease (CKD) stage G3b/A2, moderately decreased glomerular filtration rate (GFR) between 30-44 mL/min/1.73 square meter and albuminuria creatinine ratio between 30-299 mg/g   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   Symptomatic bradycardia   For questions or updates, please contact [facility] Please consult [email or web address] for contact info under Cardiology/STEMI.   Signed, Rosaria Ferries, PA-C  08/20/2019 11:45 AM   I have done an extensive review of this patient's medical record and have reviewed telemetry strips. I agree with the assessment and plan as outlined above. 86 yo male with history of atrial fibrillation, recent stroke, HTN and CAD who we are asked to see today for evaluation of atrial fibrillation and bradycardia. He is followed at the [facility] and reportedly has CAD and atrial fib but was not on long term anti-coagulation. He was admitted on 08/15/19 with an acute CVA and received  tPA. LVEF=45-50% by echo earlier this week. He was discharged home on 08/17/19 on Eliquis 2.5 BID and  Metoprolol 50 BID. He was readmitted 08/19/19 with nausea and vomiting. He was noted to have periods of bradycardia with atrial fib, rates down into the 30s. His beta blocker was held. Over the last 48 hours, he is noted to have periods of bradycardia. His blood pressure has remained stable and he has had no further nausea or vomiting.   It is unclear if his initial presentation with nausea and vomiting was due to his bradycardia. He has had no recurrence of N/V while admitted, even when there are periods of bradycardia. He has now been off of the beta blocker for over 24 hours. Heart rate into the 80s with ambulation. I have reviewed the case with Dr. Candiss Norse who does not have another good reason for the nausea/vomiting that led to his re-admission.   -Continue to hold beta blocker. Continue Eliquis. I will ask our EP team to see him today to make recommendations on the need for a pacemaker.    Lauree Chandler 08/20/2019 12:44 PM

## 2019-08-20 NOTE — Progress Notes (Signed)
ANTICOAGULATION CONSULT NOTE - Follow Up Consult  Pharmacy Consult for heparin Indication: atrial fibrillation  Labs: Recent Labs    08/18/19 2150 08/18/19 2150 08/19/19 0458 08/19/19 1141 08/19/19 1944 08/20/19 0343  HGB 13.8   < > 14.1  --   --  14.5  HCT 42.2  --  42.9  --   --  44.9  PLT 351  --  322  --   --  353  APTT  --   --   --  101* 61* 116*  HEPARINUNFRC  --   --   --  0.75*  --  0.89*  CREATININE 1.82*  --  1.69*  --   --  1.51*   < > = values in this interval not displayed.    Assessment: 86yo male slightly supratherapeutic on heparin after rate change; no gtt issues or signs of bleeding per RN.  Goal of Therapy:  Heparin level 0.3-0.7 units/ml aPTT 66-102 seconds   Plan:  Will decrease heparin gtt slightly to 1250 units/hr and check labs in 8 hours.    Wynona Neat, PharmD, BCPS  08/20/2019,6:14 AM

## 2019-08-20 NOTE — Progress Notes (Signed)
ANTICOAGULATION CONSULT NOTE - follow up Pharmacy Consult for heparin Indication: atrial fibrillation  No Known Allergies  Patient Measurements: Height: 6' (182.9 cm) Weight: 206 lb 5.6 oz (93.6 kg) IBW/kg (Calculated) : 77.6 Heparin Dosing Weight: 93.6 kg  Vital Signs: Temp: 97.4 F (36.3 C) (02/19 1131) Temp Source: Axillary (02/19 1131) BP: 120/85 (02/19 1131) Pulse Rate: 59 (02/19 0740)  Labs: Recent Labs    08/18/19 2150 08/18/19 2150 08/19/19 0458 08/19/19 1141 08/19/19 1141 08/19/19 1944 08/20/19 0343 08/20/19 1339  HGB 13.8   < > 14.1  --   --   --  14.5  --   HCT 42.2  --  42.9  --   --   --  44.9  --   PLT 351  --  322  --   --   --  353  --   APTT  --   --   --  101*   < > 61* 116* 89*  HEPARINUNFRC  --   --   --  0.75*  --   --  0.89* 0.70  CREATININE 1.82*  --  1.69*  --   --   --  1.51*  --    < > = values in this interval not displayed.    Estimated Creatinine Clearance: 40.9 mL/min (A) (by C-G formula based on SCr of 1.51 mg/dL (H)).   Medical History: Past Medical History:  Diagnosis Date  . A-fib ([place])   . Coronary artery disease   . Hypertension   . Prostate cancer ([place]) 2010    Medications:  See medication history  Assessment: 86 year old male currenly on heparin drip for atrial fibrillation.  He was on eliquis PTA - last dose on 2/16 at 1600 PM.   Eliquis held in case needs pacemaker placement.  PTT 89, HL 0.7 on heparin gtt 1250 ut/hr,  Therapeutic PTT,  HL therapeutic, appears to be correlating with aPTT.  Will monitor daily heparin level going forward. PTA  Eliquis , LD given 2/16 @1600  No bleeding reported , CBC wnl/stable   Goal of Therapy:  APTT 66-102 sec Heparin level 0.3-0.7 units/ml Monitor platelets by anticoagulation protocol: Yes   Plan:  Continue IV heparin drip 1250 units/hr. Daily HL and CBC  Monitor for bleeding complications  Nicole Cella, RPh Clinical Pharmacist Please refer to [facility] for [place]  numbers 08/20/2019,2:16 PM

## 2019-08-20 NOTE — Consult Note (Addendum)
Cardiology Consultation:   Patient ID: NYHEEM MADURO MRN: [id number]; DOB: 07/12/1929  Admit date: 08/18/2019 Date of Consult: 08/20/2019  Primary Care Provider: [facility], [name] Primary Cardiologist: new to [facility], Dr. Angelena Form Primary Electrophysiologist:  None   Due to the COVID-19 pandemic, this visit was completed with telemedicine (audio/video) technology to reduce patient and provider exposure as well as to preserve personal protective equipment.    Patient Profile:   Dillon Smith is a 86 y.o. male with a hx of HTN, HLD, CKD (III), prostate cancer, CAD (of unknown severity), AFib, and recent stroke who is being seen today for the evaluation of bradycardia at the request of Dr. Angelena Form.  History of Present Illness:   Mr. Lutzow was recently admitted to [facility] 08/15/2019 with acute stroke, he apparently had known hx of AFib though not on a/c.  He was also noted to be COVID + though asymptomatic and required no particular treatment for this He was started on eliquis, discharged 08/17/19, back on his home medications of amlodipine 10mg  and metoprolol 50mg  BID CV strips reviewed from this stay with rate controlled Afib 60's-80's, PVCs noted.  TTE done noted LVEF 45-50%, LA described as severely dilated (54mm), mild-mod MR  He returns and is admitted to [facility] 2/17.21 with observation of some confusion when speaking on the phone and EMS was sent out.  EMS by H&P initially found him to seem normal, though did develop nausea with vomiting that correlated with transient bradycardia 30's, this apparently recurrent with recovery and no symptoms in between the episodes. In the ER  HR 50's-60's with transient dips to 30's also observed, unclear if any symptoms, though H&P mentions never noted to be hypotensive.  Cardiology was consulted he was admitted his home lopressor 50mg  BID held. Cardiology consult is reviewed.  Nothing mostly bradycardia nocturnal, only transient slowing with awake  periods, no pauses >3 seconds and report HR into 80's with ambulation. They note the patient has not had any further nausea/vomiting here with or without the transient slowing rates and did not feel PPM was indicated.  In d/w IM service though, medicine team felt no other explaination for his n/v and felt this was a symptom of the bradycardia.  EP is asked to weigh in.  LABS K+ 4.6 Mag 2.3 BUN/Creat 25/1.82 >> 16/1.51 WBC 6.0 H/H 13/42 Plts 351 TSH 2.736  COVID positive 08/15/2019, asymptomatic then and now from this  VSS, afebrile, 92-100% sat on RA, BP stable  Last dose of metoprolol was 08/17/2018 about 1800 (1/2 life 4 hours) and is out of his system  I called the patient into his hospital room. He identifies himself by name and birth date  He tells me that he did not at any point feel like he was confused.  He does say he was nauseous, and did vomit "some".  He feels like he ate something that did not agree with him He denies any history of syncope ever With this he denies feeling near syncope or syncope He denies any kind of CP, palpitations or cardiac awareness. He reports ambulating in his room without difficulty or symptoms of bradycardia, no SOB or DOE    Heart Pathway Score:     Past Medical History:  Diagnosis Date  . A-fib ([place])   . Coronary artery disease   . Hypertension   . Prostate cancer ([place]) 2010    Past Surgical History:  Procedure Laterality Date  . COLONOSCOPY  2004  . INSERTION PROSTATE  RADIATION SEED  2010     Home Medications:  Prior to Admission medications   Medication Sig Start Date End Date Taking? Authorizing Provider  amLODipine (NORVASC) 10 MG tablet Take 10 mg by mouth daily. 06/01/19  Yes [provider]  apixaban (ELIQUIS) 2.5 MG TABS tablet Take 1 tablet (2.5 mg total) by mouth 2 (two) times daily. 08/17/19  Yes Garvin Fila, MD  atorvastatin (LIPITOR) 40 MG tablet Take 1 tablet (40 mg total) by mouth at bedtime. 08/17/19   Yes Garvin Fila, MD  metoprolol tartrate (LOPRESSOR) 50 MG tablet Take 50 mg by mouth 2 (two) times daily. 06/02/19  Yes [provider]    Inpatient Medications: Scheduled Meds: . amLODipine  10 mg Oral Daily  . atorvastatin  40 mg Oral q1800  . pantoprazole  40 mg Oral Daily   Continuous Infusions: . heparin 1,250 Units/hr (08/20/19 0644)   PRN Meds: acetaminophen, [DISCONTINUED] ondansetron **OR** ondansetron (ZOFRAN) IV, polyethylene glycol  Allergies:   No Known Allergies  Social History:   Social History   Socioeconomic History  . Marital status: Divorced    Spouse name: Not on file  . Number of children: Not on file  . Years of education: Not on file  . Highest education level: Not on file  Occupational History  . Not on file  Tobacco Use  . Smoking status: Former Research scientist (life sciences)  . Smokeless tobacco: Never Used  Substance and Sexual Activity  . Alcohol use: Not on file  . Drug use: Not on file  . Sexual activity: Not on file  Other Topics Concern  . Not on file  Social History Narrative  . Not on file   Social Determinants of Health   Financial Resource Strain:   . Difficulty of Paying Living Expenses: Not on file  Food Insecurity:   . Worried About Charity fundraiser in the Last Year: Not on file  . Ran Out of Food in the Last Year: Not on file  Transportation Needs:   . Lack of Transportation (Medical): Not on file  . Lack of Transportation (Non-Medical): Not on file  Physical Activity:   . Days of Exercise per Week: Not on file  . Minutes of Exercise per Session: Not on file  Stress:   . Feeling of Stress : Not on file  Social Connections:   . Frequency of Communication with Friends and Family: Not on file  . Frequency of Social Gatherings with Friends and Family: Not on file  . Attends Religious Services: Not on file  . Active Member of Clubs or Organizations: Not on file  . Attends Archivist Meetings: Not on file  . Marital  Status: Not on file  Intimate Partner Violence:   . Fear of Current or Ex-Partner: Not on file  . Emotionally Abused: Not on file  . Physically Abused: Not on file  . Sexually Abused: Not on file    Family History:   He tells me he is uncertain of any particular family medical problems   ROS:  Please see the history of present illness.  All other ROS reviewed and negative.     Physical Exam/Data:   Vitals:   08/20/19 0537 08/20/19 0740 08/20/19 0741 08/20/19 1131  BP: (!) 155/67  130/65 120/85  Pulse: (!) 41 (!) 59    Resp: 12  17 20   Temp: (!) 97.3 F (36.3 C)  (!) 97.5 F (36.4 C) (!) 97.4 F (36.3 C)  TempSrc: Oral  Oral Axillary  SpO2: 100%  98% 92%  Weight:      Height:        Intake/Output Summary (Last 24 hours) at 08/20/2019 1327 Last data filed at 08/20/2019 [id number] Gross per 24 hour  Intake 266.46 ml  Output 875 ml  Net -608.54 ml   Last 3 Weights 08/18/2019 08/16/2019 08/15/2019  Weight (lbs) 206 lb 5.6 oz 206 lb 5.6 oz 206 lb 2.1 oz  Weight (kg) 93.6 kg 93.6 kg 93.5 kg     Body mass index is 27.99 kg/m.  General:  Very pleasant on the phone He speaks in full sentences, there is a slight slur to his speech but easily understood.  He does not sound SOB or in any distress Neuro:  AAO x4 Psych:  Normal affect   EKG:  The EKG was personally reviewed and demonstrates:   AFib 63bpm, PVCs, no ST/T changes  Telemetry:  Telemetry was personally reviewed and demonstrates:   AFib generally 60's-80's, intermittent at times more frequent PVCs, rare couplet, 3 beat NSVT noctunal rates intermittently 40's-50's with brief slowing to high 30's at times as well, he has had some compensatory pauses though none > 3 seconds that I see  Relevant CV Studies:  08/16/2019: TTE IMPRESSIONS  1. Left ventricular ejection fraction, by estimation, is 45 to 50%. The  left ventricle has mildly decreased function. The left ventricle  demonstrates global hypokinesis. The left  ventricular internal cavity size  was mildly dilated. Left ventricular  diastolic function could not be evaluated.  2. Right ventricular systolic function is mildly reduced. The right  ventricular size is normal. There is normal pulmonary artery systolic  pressure. The estimated right ventricular systolic pressure is 123456 mmHg.  3. Left atrial size was severely dilated.  4. The mitral valve is degenerative. Mild to moderate mitral valve  regurgitation.  5. The aortic valve is tricuspid. Aortic valve regurgitation is moderate.  No aortic stenosis is present.  6. The inferior vena cava is normal in size with greater than 50%  respiratory variability, suggesting right atrial pressure of 3 mmHg.   Laboratory Data:  High Sensitivity Troponin:  No results for input(s): TROPONINIHS in the last 720 hours.   Chemistry Recent Labs  Lab 08/18/19 2150 08/19/19 0458 08/20/19 0343  NA 137 137 138  K 4.6 5.1 4.5  CL 107 104 105  CO2 19* 21* 22  GLUCOSE 154* 141* 88  BUN 25* 21 16  CREATININE 1.82* 1.69* 1.51*  CALCIUM 8.7* 8.9 9.1  GFRNONAA 33* 36* 41*  GFRAA 38* 41* 47*  ANIONGAP 11 12 11     Recent Labs  Lab 08/18/19 2150 08/19/19 0458 08/20/19 0343  PROT 6.2* 6.3* 6.4*  ALBUMIN 2.4* 2.5* 2.5*  AST 51* 43* 41  ALT 48* 45* 45*  ALKPHOS 60 55 51  BILITOT 0.7 0.7 0.9   Hematology Recent Labs  Lab 08/18/19 2150 08/19/19 0458 08/20/19 0343  WBC 6.0 6.2 6.9  RBC 4.51 4.53 4.76  HGB 13.8 14.1 14.5  HCT 42.2 42.9 44.9  MCV 93.6 94.7 94.3  MCH 30.6 31.1 30.5  MCHC 32.7 32.9 32.3  RDW 13.2 13.1 13.1  PLT 351 322 353   BNPNo results for input(s): BNP, PROBNP in the last 168 hours.  DDimer No results for input(s): DDIMER in the last 168 hours.   Radiology/Studies:  DG Abd 1 View  Result Date: 08/19/2019 CLINICAL DATA:  Nausea. EXAM: ABDOMEN - 1 VIEW COMPARISON:  None. FINDINGS: The left lung base appears clear. The bowel gas pattern is unremarkable. No findings for  obstruction or perforation. Calcifications in the right abdomen adjacent to the L1 transverse process could reflect renal artery calcifications. No obvious renal or ureteral calculi. Brachytherapy seeds are noted in the prostate gland. The bony structures are intact. IMPRESSION: No plain film findings for an acute abdominal process. Electronically Signed   By: Marijo Sanes M.D.   On: 08/19/2019 12:20   CT Head Wo Contrast  Result Date: 08/18/2019 CLINICAL DATA:  86 year old male with neurologic deficit. EXAM: CT HEAD WITHOUT CONTRAST TECHNIQUE: Contiguous axial images were obtained from the base of the skull through the vertex without intravenous contrast. COMPARISON:  Head CT dated 08/16/2019. FINDINGS: Brain: The ventricles and sulci appropriate size for patient's age. Mild periventricular and deep white matter chronic microvascular ischemic changes noted. There is no acute intracranial hemorrhage. No mass effect or midline shift. No extra-axial fluid collection. Vascular: No hyperdense vessel or unexpected calcification. Skull: Normal. Negative for fracture or focal lesion. Sinuses/Orbits: Mild mucoperiosteal thickening of paranasal sinuses with opacification of several ethmoid air cells. No air-fluid level. The mastoid air cells are clear. Other: None IMPRESSION: 1. No acute intracranial hemorrhage. 2. Age-related atrophy and chronic microvascular ischemic changes. Electronically Signed   By: Anner Crete M.D.   On: 08/18/2019 22:47    DG CHEST PORT 1 VIEW  Result Date: 08/19/2019 CLINICAL DATA:  COVID-19 infection. EXAM: PORTABLE CHEST 1 VIEW COMPARISON:  Lung apices of neck CTA 08/15/2019 FINDINGS: Post median sternotomy. Mild cardiomegaly. Heterogeneous patchy opacity at the right lung base. Additional peripheral opacities in the upper lobes on neck CT a not well visualized radiographically. No pulmonary edema. No pleural fluid or pneumothorax. BBs project over the left chest wall. IMPRESSION:  1. Heterogeneous patchy opacity at the right lung base may represent atelectasis or pneumonia in the setting of COVID infection. 2. Mild cardiomegaly post median sternotomy. Electronically Signed   By: Keith Rake M.D.   On: 08/19/2019 01:39       {  Assessment and Plan:   1. Bradycardia      I am observing while we talk on the phone, his HR 80's with intermittent PVCs In review of his telemetry  His HR generally 60's-80's he does have slowing nocturnally transient 40's-65's and some brief 30's Compensatory pauses after PVCs on occasion, though none > 3 seconds  Agree with holding his home metoprolol  I do not get clear symptoms of bradycardia from the patient He denies exertional intolerances, no history of near syncope or syncope  I do not see EMS records in Epic, or ER tracings  n/v certainly could create vagally mediated transient bradycardia Bradycardia could cause nausea/weakness, vomiting though would be expected with persistent profound bradycardia, not the brief episodes we see here He has not had symptoms of the same here even with his brief slowing or short pauses  I do not see clear need for pacing at this time  Dr. Rayann Heman will see the patient later this afternoon    For questions or updates, please contact [facility] Please consult [email or web address] for contact info under     Signed, Baldwin [place], PA-C  08/20/2019 1:27 PM   I have reviewed the above assessment and plan with EP PA.  Changes to above are made where necessary.  The patient has had bradycardia in the setting of nausea and vomiting.  No indication for pacing at this time.  Stop metoprolol.  No further monitoring required.  Ok to discharge from EP standpoint.  Electrophysiology team to see as needed while here. Please call with questions.   Co Sign: Thompson Grayer, MD 08/20/2019 9:13 PM

## 2019-08-21 LAB — COMPREHENSIVE METABOLIC PANEL
ALT: 46 U/L — ABNORMAL HIGH (ref 0–44)
AST: 41 U/L (ref 15–41)
Albumin: 2.4 g/dL — ABNORMAL LOW (ref 3.5–5.0)
Alkaline Phosphatase: 59 U/L (ref 38–126)
Anion gap: 14 (ref 5–15)
BUN: 17 mg/dL (ref 8–23)
CO2: 20 mmol/L — ABNORMAL LOW (ref 22–32)
Calcium: 8.9 mg/dL (ref 8.9–10.3)
Chloride: 105 mmol/L (ref 98–111)
Creatinine, Ser: 1.7 mg/dL — ABNORMAL HIGH (ref 0.61–1.24)
GFR calc Af Amer: 41 mL/min — ABNORMAL LOW (ref >60)
GFR calc non Af Amer: 35 mL/min — ABNORMAL LOW (ref >60)
Glucose, Bld: 84 mg/dL (ref 70–99)
Potassium: 4.5 mmol/L (ref 3.5–5.1)
Sodium: 139 mmol/L (ref 135–145)
Total Bilirubin: 0.8 mg/dL (ref 0.3–1.2)
Total Protein: 6.4 g/dL — ABNORMAL LOW (ref 6.5–8.1)

## 2019-08-21 LAB — CBC WITH DIFFERENTIAL/PLATELET
Abs Immature Granulocytes: 0.03 10*3/uL (ref 0.00–0.07)
Basophils Absolute: 0 10*3/uL (ref 0.0–0.1)
Basophils Relative: 0 %
Eosinophils Absolute: 0.1 10*3/uL (ref 0.0–0.5)
Eosinophils Relative: 1 %
HCT: 43.3 % (ref 39.0–52.0)
Hemoglobin: 14.1 g/dL (ref 13.0–17.0)
Immature Granulocytes: 1 %
Lymphocytes Relative: 43 %
Lymphs Abs: 2.9 10*3/uL (ref 0.7–4.0)
MCH: 30.5 pg (ref 26.0–34.0)
MCHC: 32.6 g/dL (ref 30.0–36.0)
MCV: 93.5 fL (ref 80.0–100.0)
Monocytes Absolute: 0.5 10*3/uL (ref 0.1–1.0)
Monocytes Relative: 7 %
Neutro Abs: 3.2 10*3/uL (ref 1.7–7.7)
Neutrophils Relative %: 48 %
Platelets: 360 10*3/uL (ref 150–400)
RBC: 4.63 MIL/uL (ref 4.22–5.81)
RDW: 13.2 % (ref 11.5–15.5)
WBC: 6.6 10*3/uL (ref 4.0–10.5)
nRBC: 0 % (ref 0.0–0.2)

## 2019-08-21 LAB — HEPARIN LEVEL (UNFRACTIONATED): Heparin Unfractionated: 0.83 IU/mL — ABNORMAL HIGH (ref 0.30–0.70)

## 2019-08-21 NOTE — Progress Notes (Signed)
ANTICOAGULATION CONSULT NOTE - Follow Up Consult  Pharmacy Consult for heparin Indication: atrial fibrillation  Labs: Recent Labs    08/18/19 2150 08/18/19 2150 08/19/19 0458 08/19/19 1141 08/19/19 1944 08/20/19 0343 08/20/19 1339 08/21/19 0532  HGB 13.8   < > 14.1  --   --  14.5  --  14.1  HCT 42.2   < > 42.9  --   --  44.9  --  43.3  PLT 351   < > 322  --   --  353  --  360  APTT  --   --   --    < > 61* 116* 89*  --   HEPARINUNFRC  --   --   --    < >  --  0.89* 0.70 0.83*  CREATININE 1.82*  --  1.69*  --   --  1.51*  --   --    < > = values in this interval not displayed.    Assessment: 86yo male supratherapeutic on heparin after one level at upper end of goal; no gtt issues or signs of bleeding per RN.  Goal of Therapy:  Heparin level 0.3-0.7 units/ml   Plan:  Will decrease heparin gtt by 1-2 units/kg/hr to 1100 units/hr and check level in 8 hours.    Wynona Neat, PharmD, BCPS  08/21/2019,6:47 AM

## 2019-08-21 NOTE — Final Progress Note (Signed)
PIV removed. Heparin gtt discontinued, per MD okay to DC heparin gtt and have patient take eliquis once home. AVS discussed with patient, all questions and concerns answered. Discharge home with home health. At time of discharge, patient acknowledged having all belongings including hearing aids, phone, hat, shoes, and clothing. Discharging on RA. Brother came to transport home.

## 2019-08-21 NOTE — Progress Notes (Signed)
RN tried to call both Delfino Lovett and [name] to notify/obtain a ride home for patient. No answer, voicemail left stating to call RN back.

## 2019-08-21 NOTE — Discharge Summary (Signed)
Dillon Smith [id number] DOB: 1929-12-16 DOA: 08/18/2019  PCP: [name]  Admit date: 08/18/2019  Discharge date: 08/21/2019  Admitted From: Home   Disposition:  Home   Recommendations for Outpatient Follow-up:   Follow up with PCP in 1-2 weeks  PCP Please obtain BMP/CBC, 2 view CXR in 1week,  (see Discharge instructions)   PCP Please follow up on the following pending results:    Home Health: None   Equipment/Devices: has rolling walker  Consultations: Cards, EP Discharge Condition: Stable    CODE STATUS: Full    Diet Recommendation: Heart Healthy     Chief Complaint  Patient presents with  . Altered Mental Status     Brief history of present illness from the day of admission and additional interim summary    Dillon Kowalsky Hopkinsis a 86 y.o.malewith medical history significant forcoronary artery disease, hypertension, chronic kidney disease stageIIIb, and atrial fibrillation not anticoagulated until a few days ago when he was admitted with embolic CVA, recent [id number] diagnosis, now presenting to the emergency department for evaluation of confusion and vomiting, was also found to be severely bradycardic and admitted to the hospital.                                                                  Hospital Course   1.  Acute Covid 19 Viral infection - was diagnosed with it last admission, this is clinically resolved.  SpO2: 96 %  Recent Labs  Lab 08/15/19 2200  SARSCOV2NAA POSITIVE*   2. 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  Was discharged from stroke team.  Was discharged on Eliquis, due to recent nausea vomiting currently on heparin drip.  Continue supportive care.  3.  Chronic atrial fibrillation.  Mali vas 2 score of at least 4.    To be  discharged on home dose Eliquis, Lopressor stopped due to bradycardia and recommendation from cardiology.  4.  Bradycardia.  Likely beta-blocker induced.    Improved after beta-blocker was discontinued, stable TSH, good chronotropic response, cleared by cardiology and EP for discharge home with Lopressor being stopped.  Follow with PCP.  Patient now completely symptom-free.  Lab Results  Component Value Date   TSH 2.736 08/19/2019     4.  Nausea vomiting.  Upon admission.  Currently resolved, head CT nonacute, abdominal exam benign, stable KUB, symptoms completely resolved, tolerating diet and eager to go home.  5.  Dyslipidemia.  On Lipitor.  6.  Hypertension.  On Norvasc.  7.  CKD 3.  Creatinine close to baseline of 1.7-1.8.    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   Symptomatic bradycardia    Discharge instructions    Discharge Instructions    Diet - low sodium heart healthy   Complete by: As directed    Discharge instructions   Complete by: As directed    Follow with [facility]   Get CBC, CMP, 2 view Chest X ray -  checked next visit within 1 week by Primary MD   Activity: As tolerated with Full fall precautions use walker/cane & assistance as needed  Disposition Home   Diet: Heart Healthy                Special Instructions: If you have smoked or chewed Tobacco  in the last 2 yrs please stop smoking, stop any regular Alcohol  and or any Recreational drug use.  On your next visit with your primary care physician please Get  Medicines reviewed and adjusted.  Please request your Prim.MD to go over all Hospital Tests and Procedure/Radiological results at the follow up, please get all Hospital records sent to your Prim MD by signing hospital release before you go home.  If you experience worsening of your admission symptoms, develop shortness of breath, life threatening emergency, suicidal or homicidal thoughts you must seek medical attention immediately by calling 911 or calling your MD immediately  if symptoms less severe.  You Must read complete instructions/literature along with all the possible adverse reactions/side effects for all the Medicines you take and that have been prescribed to you. Take any new Medicines after you have completely understood and accpet all the possible adverse reactions/side effects.   Do not drive, operate heavy machinery, perform activities at heights, swimming or participation in water activities or provide baby sitting services if your were admitted for syncope or siezures until you have seen by Primary MD or a Neurologist and advised to do so again.   Increase activity slowly   Complete by: As directed    MyChart COVID-19 home monitoring program   Complete by: Aug 21, 2019    Is the patient willing to use the [place] for home monitoring?: Yes   Temperature monitoring   Complete by: Aug 21, 2019    After how many days would you like to receive a notification of this patient's flowsheet entries?: 1      Discharge Medications   Allergies as of 08/21/2019   No Known Allergies     Medication List    STOP taking these medications   metoprolol tartrate 50 MG tablet Commonly known as: LOPRESSOR     TAKE these medications   amLODipine 10 MG tablet Commonly known as: NORVASC Take 10 mg by mouth daily.   apixaban 2.5 MG Tabs tablet Commonly known as: ELIQUIS Take 1 tablet (2.5 mg total) by mouth 2 (two) times daily.   atorvastatin 40 MG tablet Commonly known as:  LIPITOR Take 1 tablet (40 mg total) by mouth at bedtime.       Follow-up Information    [facility] Follow up.   Specialty: [facility] Contact information: [place] [address] [phone]        Clinic, [facility] Schedule an appointment as soon as possible for a visit in  1 week(s).   Contact information: [place] [address] [phone]           Major procedures and Radiology Reports - PLEASE review detailed and final reports thoroughly  -         CT Code Stroke CTA Head W/WO contrast  Result Date: 08/15/2019 CLINICAL DATA:  Right-sided gaze disturbance. Slurred speech. Left-sided weakness. EXAM: CT ANGIOGRAPHY HEAD AND NECK CT PERFUSION BRAIN TECHNIQUE: Multidetector CT imaging of the head and neck was performed using the standard protocol during bolus administration of intravenous contrast. Multiplanar CT image reconstructions and MIPs were obtained to evaluate the vascular anatomy. Carotid stenosis measurements (when applicable) are obtained utilizing NASCET criteria, using the distal internal carotid diameter as the denominator. Multiphase CT imaging of the brain was performed following IV bolus contrast injection. Subsequent parametric perfusion maps were calculated using RAPID software. CONTRAST:  149mL OMNIPAQUE IOHEXOL 350 MG/ML SOLN COMPARISON:  Head CT earlier same day. FINDINGS: CTA NECK FINDINGS Aortic arch: Aortic atherosclerosis. Branching pattern is normal without origin stenosis. Right carotid system: Common carotid artery widely patent to the bifurcation. Soft and calcified plaque at the carotid bifurcation and ICA bulb. Minimal diameter of the ICA bulb is 3.7 mm. Compared to a more distal cervical ICA diameter of 4.3 mm, this indicates a 15% stenosis. Left carotid system: Common carotid artery widely patent to the bifurcation. Calcified plaque at the carotid bifurcation and ICA  bulb. Minimal diameter is 4.5 mm, the same as the more distal cervical ICA. Therefore there is no stenosis. The cervical ICA is markedly tortuous. Vertebral arteries: There is calcified plaque at both vertebral artery origins. Severe stenosis on the right, 80% or greater. Moderate stenosis on the left, 50-75%. Both vertebral arteries do show flow to the foramen magnum. Skeleton: Ordinary spondylosis. Other neck: Soft tissue lipoma of the anterior midline neck. Upper chest: Mild peripheral scarring on the right. Review of the MIP images confirms the above findings CTA HEAD FINDINGS Anterior circulation: Both internal carotid arteries are patent through the skull base and siphon regions. There is siphon atherosclerotic calcification but without stenosis greater than 30%. No large or medium vessel occlusion identified on the left. There is a single azygos anterior cerebral artery supplying both anterior cerebral artery territories. On the right, there is some atherosclerotic irregularity of the M1 segment but no flow limiting stenosis. There is an occluded M3 branch serving the frontal operculum. Posterior circulation: Both vertebral arteries are patent through the foramen magnum to the basilar. Before atherosclerotic disease but without stenosis greater than 30%. 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 Superior cerebellar and posterior cerebral arteries show flow. Venous sinuses: Patent and normal. Anatomic variants: None significant. Review of the MIP images confirms the above findings CT Brain Perfusion Findings: ASPECTS: 10 Perfusion data is not reliable. In looking at the source data, there does appear to be a small region of diminished cerebral blood volume in the right frontal operculum, estimated at about 15 cc. There is also diminished cerebral blood flow and delayed mean transit time in that region. IMPRESSION:  Right M3 branch vessel occlusion serving the right frontal operculum. Perfusion data is unreliable because of motion. Viewing the source images, the patient does demonstrate a region of diminished cerebral blood volume, cerebral blood flow and prolonged mean transit time in the frontal operculum ir region, affecting about 15-20 cc of brain. Atherosclerotic  disease at the aortic arch. Atherosclerotic disease at both carotid bifurcations. 15% ICA stenosis on the right. No stenosis on the left. Atherosclerotic disease in the carotid siphon regions but without stenosis greater than 30%. Stenosis of both vertebral artery origins, severe on the right and moderate on the left. 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 Findings discussed with Dr. Rory Percy during performance. Electronically Signed   By: Nelson Chimes M.D.   On: 08/15/2019 20:56   DG Abd 1 View  Result Date: 08/19/2019 CLINICAL DATA:  Nausea. EXAM: ABDOMEN - 1 VIEW COMPARISON:  None. FINDINGS: The left lung base appears clear. The bowel gas pattern is unremarkable. No findings for obstruction or perforation. Calcifications in the right abdomen adjacent to the L1 transverse process could reflect renal artery calcifications. No obvious renal or ureteral calculi. Brachytherapy seeds are noted in the prostate gland. The bony structures are intact. IMPRESSION: No plain film findings for an acute abdominal process. Electronically Signed   By: Marijo Sanes M.D.   On: 08/19/2019 12:20   CT Head Wo Contrast  Result Date: 08/18/2019 CLINICAL DATA:  86 year old male with neurologic deficit. EXAM: CT HEAD WITHOUT CONTRAST TECHNIQUE: Contiguous axial images were obtained from the base of the skull through the vertex without intravenous contrast. COMPARISON:  Head CT dated 08/16/2019. FINDINGS: Brain: The ventricles and sulci appropriate size for patient's age. Mild periventricular and deep white matter chronic microvascular ischemic changes noted. There is no  acute intracranial hemorrhage. No mass effect or midline shift. No extra-axial fluid collection. Vascular: No hyperdense vessel or unexpected calcification. Skull: Normal. Negative for fracture or focal lesion. Sinuses/Orbits: Mild mucoperiosteal thickening of paranasal sinuses with opacification of several ethmoid air cells. No air-fluid level. The mastoid air cells are clear. Other: None IMPRESSION: 1. No acute intracranial hemorrhage. 2. Age-related atrophy and chronic microvascular ischemic changes. Electronically Signed   By: Anner Crete M.D.   On: 08/18/2019 22:47   CT HEAD WO CONTRAST  Result Date: 08/16/2019 CLINICAL DATA:  86 year old male 24 hours status post tPA for code stroke presentation. Right M3 branch occlusion suspected on CTA yesterday. EXAM: CT HEAD WITHOUT CONTRAST TECHNIQUE: Contiguous axial images were obtained from the base of the skull through the vertex without intravenous contrast. COMPARISON:  CTA head and neck 08/15/2019 and earlier. FINDINGS: Brain: No intracranial mass effect. No acute intracranial hemorrhage identified. No definite acute or evolving cortical infarct identified in the right hemisphere; hypodense area on series 3, image 20 appears to be a sulcus. And patchy bilateral white matter hypodensity has not definitely changed. Stable gray-white matter differentiation elsewhere. Normal basilar cisterns. No midline shift. Stable ventricle size and configuration. Vascular: Calcified atherosclerosis at the skull base. No suspicious intracranial vascular hyperdensity. Skull: No acute osseous abnormality identified. Sinuses/Orbits: Visualized paranasal sinuses and mastoids are stable and well pneumatized. Other: Resolved gaze deviation. No acute orbit or scalp soft tissue finding. IMPRESSION: 1. No acute or evolving cerebral infarct identified by CT compared to yesterday. 2. No intracranial hemorrhage or mass effect. Electronically Signed   By: Genevie Ann M.D.   On: 08/16/2019  18:23   CT Code Stroke CTA Neck W/WO contrast  Result Date: 08/15/2019 CLINICAL DATA:  Right-sided gaze disturbance. Slurred speech. Left-sided weakness. EXAM: CT ANGIOGRAPHY HEAD AND NECK CT PERFUSION BRAIN TECHNIQUE: Multidetector CT imaging of the head and neck was performed using the standard protocol during bolus administration of intravenous contrast. Multiplanar CT image reconstructions and MIPs were obtained to evaluate the vascular anatomy.  Carotid stenosis measurements (when applicable) are obtained utilizing NASCET criteria, using the distal internal carotid diameter as the denominator. Multiphase CT imaging of the brain was performed following IV bolus contrast injection. Subsequent parametric perfusion maps were calculated using RAPID software. CONTRAST:  169mL OMNIPAQUE IOHEXOL 350 MG/ML SOLN COMPARISON:  Head CT earlier same day. FINDINGS: CTA NECK FINDINGS Aortic arch: Aortic atherosclerosis. Branching pattern is normal without origin stenosis. Right carotid system: Common carotid artery widely patent to the bifurcation. Soft and calcified plaque at the carotid bifurcation and ICA bulb. Minimal diameter of the ICA bulb is 3.7 mm. Compared to a more distal cervical ICA diameter of 4.3 mm, this indicates a 15% stenosis. Left carotid system: Common carotid artery widely patent to the bifurcation. Calcified plaque at the carotid bifurcation and ICA bulb. Minimal diameter is 4.5 mm, the same as the more distal cervical ICA. Therefore there is no stenosis. The cervical ICA is markedly tortuous. Vertebral arteries: There is calcified plaque at both vertebral artery origins. Severe stenosis on the right, 80% or greater. Moderate stenosis on the left, 50-75%. Both vertebral arteries do show flow to the foramen magnum. Skeleton: Ordinary spondylosis. Other neck: Soft tissue lipoma of the anterior midline neck. Upper chest: Mild peripheral scarring on the right. Review of the MIP images confirms the above  findings CTA HEAD FINDINGS Anterior circulation: Both internal carotid arteries are patent through the skull base and siphon regions. There is siphon atherosclerotic calcification but without stenosis greater than 30%. No large or medium vessel occlusion identified on the left. There is a single azygos anterior cerebral artery supplying both anterior cerebral artery territories. On the right, there is some atherosclerotic irregularity of the M1 segment but no flow limiting stenosis. There is an occluded M3 branch serving the frontal operculum. Posterior circulation: Both vertebral arteries are patent through the foramen magnum to the basilar. Before atherosclerotic disease but without stenosis greater than 30%. 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 Superior cerebellar and posterior cerebral arteries show flow. Venous sinuses: Patent and normal. Anatomic variants: None significant. Review of the MIP images confirms the above findings CT Brain Perfusion Findings: ASPECTS: 10 Perfusion data is not reliable. In looking at the source data, there does appear to be a small region of diminished cerebral blood volume in the right frontal operculum, estimated at about 15 cc. There is also diminished cerebral blood flow and delayed mean transit time in that region. 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 Perfusion data is unreliable because of motion. Viewing the source images, the patient does demonstrate a region of diminished cerebral blood volume, cerebral blood flow and prolonged mean transit time in the frontal operculum ir region, affecting about 15-20 cc of brain. Atherosclerotic disease at the aortic arch. Atherosclerotic disease at both carotid bifurcations. 15% ICA stenosis on the right. No stenosis on the left. Atherosclerotic disease in the carotid siphon regions but without  stenosis greater than 30%. Stenosis of both vertebral artery origins, severe on the right and moderate on the left. 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 Findings discussed with Dr. Rory Percy during performance. Electronically Signed   By: Nelson Chimes M.D.   On: 08/15/2019 20:56   MR ANGIO HEAD WO CONTRAST  Result Date: 08/16/2019 CLINICAL DATA:  Stroke follow-up. Status post tPA. EXAM: MRI HEAD WITHOUT CONTRAST MRA HEAD WITHOUT CONTRAST TECHNIQUE: Multiplanar, multiecho pulse sequences  of the brain and surrounding structures were obtained without intravenous contrast. Angiographic images of the head were obtained using MRA technique without contrast. COMPARISON:  None. FINDINGS: MRI HEAD FINDINGS Brain: Multifocal abnormal diffusion restriction within the right hemisphere, predominantly within the right frontal lobe and right insula. Small area diffusion restriction in the right occipital lobe. No contralateral diffusion abnormality. No acute hemorrhage. Early confluent hyperintense T2-weighted signal of the periventricular and deep white matter, most commonly due to chronic ischemic microangiopathy. Normal volume of CSF spaces. No chronic microhemorrhage. Normal midline structures. Vascular: Normal flow voids. Skull and upper cervical spine: Normal marrow signal. Sinuses/Orbits: Negative. Other: None. MRA HEAD FINDINGS POSTERIOR CIRCULATION: --Vertebral arteries: Normal V4 segments. --Posterior inferior cerebellar arteries (PICA): Patent origins from the vertebral arteries. --Anterior inferior cerebellar arteries (AICA): Patent origins from the basilar artery. --Basilar artery: Multifocal moderate narrowing of the basilar artery may be exaggerated by motion. --Superior cerebellar arteries: Normal. --Posterior cerebral arteries: Normal. Both originate from the basilar artery. Posterior communicating arteries (p-comm) are diminutive or absent. ANTERIOR CIRCULATION: --Intracranial internal carotid arteries:  Normal. --Anterior cerebral arteries (ACA): Azygos configuration. Both A1 segments are present. --Middle cerebral arteries (MCA): Apparent narrowing of the inferior division left M2 segment is likely due to motion. Otherwise normal. IMPRESSION: 1. 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 No hemorrhage or mass effect. 2. No emergent large vessel occlusion. 3. Multifocal moderate narrowing of the basilar artery is probably exaggerated by motion artifact. 4. Findings of chronic ischemic microangiopathy. Electronically Signed   By: Ulyses Jarred M.D.   On: 08/16/2019 21:46   MR BRAIN WO CONTRAST  Result Date: 08/16/2019 CLINICAL DATA:  Stroke follow-up. Status post tPA. EXAM: MRI HEAD WITHOUT CONTRAST MRA HEAD WITHOUT CONTRAST TECHNIQUE: Multiplanar, multiecho pulse sequences of the brain and surrounding structures were obtained without intravenous contrast. Angiographic images of the head were obtained using MRA technique without contrast. COMPARISON:  None. FINDINGS: MRI HEAD FINDINGS Brain: Multifocal abnormal diffusion restriction within the right hemisphere, predominantly within the right frontal lobe and right insula. Small area diffusion restriction in the right occipital lobe. No contralateral diffusion abnormality. No acute hemorrhage. Early confluent hyperintense T2-weighted signal of the periventricular and deep white matter, most commonly due to chronic ischemic microangiopathy. Normal volume of CSF spaces. No chronic microhemorrhage. Normal midline structures. Vascular: Normal flow voids. Skull and upper cervical spine: Normal marrow signal. Sinuses/Orbits: Negative. Other: None. MRA HEAD FINDINGS POSTERIOR CIRCULATION: --Vertebral arteries: Normal V4 segments. --Posterior inferior cerebellar arteries (PICA): Patent origins from the vertebral arteries. --Anterior inferior cerebellar arteries (AICA): Patent origins  from the basilar artery. --Basilar artery: Multifocal moderate narrowing of the basilar artery may be exaggerated by motion. --Superior cerebellar arteries: Normal. --Posterior cerebral arteries: Normal. Both originate from the basilar artery. Posterior communicating arteries (p-comm) are diminutive or absent. ANTERIOR CIRCULATION: --Intracranial internal carotid arteries: Normal. --Anterior cerebral arteries (ACA): Azygos configuration. Both A1 segments are present. --Middle cerebral arteries (MCA): Apparent narrowing of the inferior division left M2 segment is likely due to motion. Otherwise normal. IMPRESSION: 1. 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 No hemorrhage or mass effect. 2. No emergent large vessel occlusion. 3. Multifocal moderate narrowing of the basilar artery is probably exaggerated by motion artifact. 4. Findings of chronic ischemic microangiopathy. Electronically Signed   By: Ulyses Jarred M.D.   On: 08/16/2019 21:46  CT Code Stroke Cerebral Perfusion with contrast  Result Date: 08/15/2019 CLINICAL DATA:  Right-sided gaze disturbance. Slurred speech. Left-sided weakness. EXAM: CT ANGIOGRAPHY HEAD AND NECK CT PERFUSION BRAIN TECHNIQUE: Multidetector CT imaging of the head and neck was performed using the standard protocol during bolus administration of intravenous contrast. Multiplanar CT image reconstructions and MIPs were obtained to evaluate the vascular anatomy. Carotid stenosis measurements (when applicable) are obtained utilizing NASCET criteria, using the distal internal carotid diameter as the denominator. Multiphase CT imaging of the brain was performed following IV bolus contrast injection. Subsequent parametric perfusion maps were calculated using RAPID software. CONTRAST:  159mL OMNIPAQUE IOHEXOL 350 MG/ML SOLN COMPARISON:  Head CT earlier same day. FINDINGS: CTA NECK FINDINGS Aortic arch:  Aortic atherosclerosis. Branching pattern is normal without origin stenosis. Right carotid system: Common carotid artery widely patent to the bifurcation. Soft and calcified plaque at the carotid bifurcation and ICA bulb. Minimal diameter of the ICA bulb is 3.7 mm. Compared to a more distal cervical ICA diameter of 4.3 mm, this indicates a 15% stenosis. Left carotid system: Common carotid artery widely patent to the bifurcation. Calcified plaque at the carotid bifurcation and ICA bulb. Minimal diameter is 4.5 mm, the same as the more distal cervical ICA. Therefore there is no stenosis. The cervical ICA is markedly tortuous. Vertebral arteries: There is calcified plaque at both vertebral artery origins. Severe stenosis on the right, 80% or greater. Moderate stenosis on the left, 50-75%. Both vertebral arteries do show flow to the foramen magnum. Skeleton: Ordinary spondylosis. Other neck: Soft tissue lipoma of the anterior midline neck. Upper chest: Mild peripheral scarring on the right. Review of the MIP images confirms the above findings CTA HEAD FINDINGS Anterior circulation: Both internal carotid arteries are patent through the skull base and siphon regions. There is siphon atherosclerotic calcification but without stenosis greater than 30%. No large or medium vessel occlusion identified on the left. There is a single azygos anterior cerebral artery supplying both anterior cerebral artery territories. On the right, there is some atherosclerotic irregularity of the M1 segment but no flow limiting stenosis. There is an occluded M3 branch serving the frontal operculum. Posterior circulation: Both vertebral arteries are patent through the foramen magnum to the basilar. Before atherosclerotic disease but without stenosis greater than 30%. 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 Superior cerebellar and posterior cerebral  arteries show flow. Venous sinuses: Patent and normal. Anatomic variants: None significant. Review of the MIP images confirms the above findings CT Brain Perfusion Findings: ASPECTS: 10 Perfusion data is not reliable. In looking at the source data, there does appear to be a small region of diminished cerebral blood volume in the right frontal operculum, estimated at about 15 cc. There is also diminished cerebral blood flow and delayed mean transit time in that region. 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 Perfusion data is unreliable because of motion. Viewing the source images, the patient does demonstrate a region of diminished cerebral blood volume, cerebral blood flow and prolonged mean transit time in the frontal operculum ir region, affecting about 15-20 cc of brain. Atherosclerotic disease at the aortic arch. Atherosclerotic disease at both carotid bifurcations. 15% ICA stenosis on the right. No stenosis on the left. Atherosclerotic disease in the carotid siphon regions but without stenosis greater than 30%. 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 Findings discussed with Dr. Rory Percy during performance. Electronically Signed   By: Nelson Chimes M.D.   On: 08/15/2019 20:56   DG CHEST PORT 1 VIEW  Result Date: 08/19/2019 CLINICAL DATA:  COVID-19 infection. EXAM: PORTABLE CHEST 1 VIEW COMPARISON:  Lung apices of neck CTA 08/15/2019 FINDINGS: Post median sternotomy. Mild cardiomegaly. Heterogeneous patchy opacity at the right lung base. Additional peripheral opacities in the upper lobes on neck CT a not well visualized radiographically. No pulmonary edema. No pleural fluid or pneumothorax. BBs project over the left chest wall. IMPRESSION: 1. Heterogeneous patchy opacity at the right lung base may represent atelectasis or pneumonia in the setting of COVID infection. 2. Mild  cardiomegaly post median sternotomy. Electronically Signed   By: Keith Rake M.D.   On: 08/19/2019 01:39   ECHOCARDIOGRAM COMPLETE  Result Date: 08/16/2019    ECHOCARDIOGRAM REPORT   Patient Name:   ALHASAN JAGODZINSKI Date of Exam: 08/16/2019 Medical Rec #:  [id number]        Height:       70.0 in Accession #:    [id number]       Weight:       206.3 lb Date of Birth:  11/29/1929        BSA:          2.12 m Patient Age:    54 years         BP:           113/96 mmHg Patient Gender: M                HR:           77 bpm. Exam Location:  Inpatient Procedure: 2D Echo, Cardiac Doppler and Color Doppler Indications:    Stroke 434.91 / I163.9  History:        Patient has no prior history of Echocardiogram examinations.                 CAD, Arrythmias:Atrial Fibrillation; Risk Factors:Hypertension.                 COVID-19 Positive.  Sonographer:    Jonelle Sidle Dance Referring Phys: [id number] ASHISH ARORA IMPRESSIONS  1. Left ventricular ejection fraction, by estimation, is 45 to 50%. The left ventricle has mildly decreased function. The left ventricle demonstrates global hypokinesis. The left ventricular internal cavity size was mildly dilated. Left ventricular diastolic function could not be evaluated.  2. Right ventricular systolic function is mildly reduced. The right ventricular size is normal. There is normal pulmonary artery systolic pressure. The estimated right ventricular systolic pressure is 123456 mmHg.  3. Left atrial size was severely dilated.  4. The mitral valve is degenerative. Mild to moderate mitral valve regurgitation.  5. The aortic valve is tricuspid. Aortic valve regurgitation is moderate. No aortic stenosis is present.  6. The inferior vena cava is normal in size with greater than 50% respiratory variability, suggesting right atrial pressure of 3 mmHg. Comparison(s): No prior Echocardiogram. FINDINGS  Left Ventricle: Left ventricular ejection fraction, by estimation, is 45 to 50%. The left ventricle has  mildly decreased function. The left ventricle demonstrates global hypokinesis. The left ventricular internal cavity size was mildly dilated. There is  no left ventricular hypertrophy. The left ventricular diastology could not be evaluated due to atrial fibrillation. Left ventricular diastolic function could not be evaluated. Right Ventricle: The right ventricular size is normal. No increase in right  ventricular wall thickness. Right ventricular systolic function is mildly reduced. There is normal pulmonary artery systolic pressure. The tricuspid regurgitant velocity is 2.64 m/s, and with an assumed right atrial pressure of 3 mmHg, the estimated right ventricular systolic pressure is 123456 mmHg. Left Atrium: Left atrial size was severely dilated. Right Atrium: Right atrial size was normal in size. Pericardium: There is no evidence of pericardial effusion. Mitral Valve: The mitral valve is degenerative in appearance. Mild mitral annular calcification. Mild to moderate mitral valve regurgitation. Tricuspid Valve: The tricuspid valve is grossly normal. Tricuspid valve regurgitation is mild. Aortic Valve: The aortic valve is tricuspid. Aortic valve regurgitation is moderate. Aortic regurgitation PHT measures 771 msec. No aortic stenosis is present. Pulmonic Valve: The pulmonic valve was grossly normal. Pulmonic valve regurgitation is trivial. Aorta: The aortic root and ascending aorta are structurally normal, with no evidence of dilitation. Venous: The inferior vena cava is normal in size with greater than 50% respiratory variability, suggesting right atrial pressure of 3 mmHg. IAS/Shunts: No atrial level shunt detected by color flow Doppler.  LEFT VENTRICLE PLAX 2D LVIDd:         6.20 cm LVIDs:         4.47 cm LV PW:         1.20 cm LV IVS:        0.89 cm LVOT diam:     2.30 cm LV SV:         55.81 ml LV SV Index:   47.40 LVOT Area:     4.15 cm  RIGHT VENTRICLE          IVC RV Basal diam:  3.33 cm  IVC diam: 1.60 cm RV  Mid diam:    2.36 cm TAPSE (M-mode): 1.1 cm LEFT ATRIUM              Index       RIGHT ATRIUM           Index LA diam:        4.90 cm  2.32 cm/m  RA Area:     21.80 cm LA Vol (A2C):   147.0 ml 69.50 ml/m RA Volume:   72.20 ml  34.13 ml/m LA Vol (A4C):   90.3 ml  42.69 ml/m LA Biplane Vol: 116.0 ml 54.84 ml/m  AORTIC VALVE LVOT Vmax:   83.00 cm/s LVOT Vmean:  50.267 cm/s LVOT VTI:    0.134 m AI PHT:      771 msec  AORTA Ao Root diam: 3.90 cm Ao Asc diam:  3.90 cm MITRAL VALVE               TRICUSPID VALVE MV Area (PHT): 3.34 cm    TR Peak grad:   27.9 mmHg MV Decel Time: 227 msec    TR Vmax:        264.00 cm/s MV E velocity: 80.30 cm/s                            SHUNTS                            Systemic VTI:  0.13 m                            Systemic Diam: 2.30 cm Eleonore Chiquito MD Electronically signed by Eleonore Chiquito MD Signature Date/Time: 08/16/2019/4:00:11  PM    Final    CT HEAD CODE STROKE WO CONTRAST  Result Date: 08/15/2019 CLINICAL DATA:  Code stroke. Right-sided gaze disturbance. Slurred speech. Left-sided weakness. EXAM: CT HEAD WITHOUT CONTRAST TECHNIQUE: Contiguous axial images were obtained from the base of the skull through the vertex without intravenous contrast. COMPARISON:  None. FINDINGS: Brain: Age related generalized volume loss. Chronic small-vessel ischemic changes affect the cerebral hemispheric white matter. Focal low-density in the external capsule regions bilaterally consistent chronic small vessel change. No sign of acute cortical infarction, mass lesion, hemorrhage, hydrocephalus or extra-axial collection. Vascular: There is atherosclerotic calcification of the major vessels at the base of the brain. Skull: Negative Sinuses/Orbits: Clear/normal Other: None ASPECTS ([place] Stroke Program Early CT Score) - Ganglionic level infarction (caudate, lentiform nuclei, internal capsule, insula, M1-M3 cortex): 7 - Supraganglionic infarction (M4-M6 cortex): 3 Total score (0-10 with 10  being normal): 10 IMPRESSION: 1. No acute finding by CT. Age related atrophy. Chronic small-vessel ischemic changes throughout the brain as above. 2. ASPECTS is 10. 3. These results were communicated to Dr. Rory Percy at 7:53 pmon 2/14/2021by text page via the [facility] messaging system. Electronically Signed   By: Nelson Chimes M.D.   On: 08/15/2019 19:54    Micro Results     Recent Results (from the past 240 hour(s))  Respiratory Panel by RT PCR (Flu A&B, Covid) - Nasopharyngeal Swab     Status: Abnormal   Collection Time: 08/15/19 10:00 PM   Specimen: Nasopharyngeal Swab  Result Value Ref Range Status   SARS Coronavirus 2 by RT PCR POSITIVE (A) NEGATIVE Final    Comment: RESULT CALLED TO, READ BACK BY AND VERIFIED WITH: M. PLUMMER,RN [id number] 08/16/2019 T. TYSOR (NOTE) SARS-CoV-2 target nucleic acids are DETECTED. SARS-CoV-2 RNA is generally detectable in upper respiratory specimens  during the acute phase of infection. Positive results are indicative of the presence of the identified virus, but do not rule out bacterial infection or co-infection with other pathogens not detected by the test. Clinical correlation with patient history and other diagnostic information is necessary to determine patient infection status. The expected result is Negative. Fact Sheet for Patients:  PinkCheek.be Fact Sheet for Healthcare Providers: GravelBags.it This test is not yet approved or cleared by the [place] FDA and  has been authorized for detection and/or diagnosis of SARS-CoV-2 by FDA under an Emergency Use Authorization (EUA).  This EUA will remain in effect (meaning this test can be used ) for the duration of  the COVID-19 declaration under Section 564(b)(1) of the Act, 21 U.S.C. section 360bbb-3(b)(1), unless the authorization is terminated or revoked sooner.    Influenza A by PCR NEGATIVE NEGATIVE Final   Influenza B by PCR NEGATIVE  NEGATIVE Final    Comment: (NOTE) The Xpert Xpress SARS-CoV-2/FLU/RSV assay is intended as an aid in  the diagnosis of influenza from Nasopharyngeal swab specimens and  should not be used as a sole basis for treatment. Nasal washings and  aspirates are unacceptable for Xpert Xpress SARS-CoV-2/FLU/RSV  testing. Fact Sheet for Patients: PinkCheek.be Fact Sheet for Healthcare Providers: GravelBags.it This test is not yet approved or cleared by the [place] FDA and  has been authorized for detection and/or diagnosis of SARS-CoV-2 by  FDA under an Emergency Use Authorization (EUA). This EUA will remain  in effect (meaning this test can be used) for the duration of the  Covid-19 declaration under Section 564(b)(1) of the Act, 21  U.S.C. section 360bbb-3(b)(1), unless the authorization is  terminated or revoked. Performed at [facility], [place] [address], [place], [place] [address]   MRSA PCR Screening     Status: None   Collection Time: 08/15/19 11:00 PM  Result Value Ref Range Status   MRSA by PCR NEGATIVE NEGATIVE Final    Comment:        The GeneXpert MRSA Assay (FDA approved for NASAL specimens only), is one component of a comprehensive MRSA colonization surveillance program. It is not intended to diagnose MRSA infection nor to guide or monitor treatment for MRSA infections. Performed at [facility], [place] [address] [place], [place] [address]     Today   Subjective    [facility] today has no headache,no chest abdominal pain,no new weakness tingling or numbness, feels much better wants to go home today.     Objective   Blood pressure (!) 146/73, pulse 82, temperature 97.9 F (36.6 C), temperature source Oral, resp. rate 20, height 6' (1.829 m), weight 93.6 kg, SpO2 96 %.   Intake/Output Summary (Last 24 hours) at 08/21/2019 0914 Last data filed at 08/21/2019 0820 Gross per 24 hour  Intake  523.35 ml  Output 450 ml  Net 73.35 ml    Exam  Awake Alert, No new F.N deficits, Normal affect [place].AT,PERRAL Supple Neck,No JVD, No cervical lymphadenopathy appriciated.  Symmetrical Chest wall movement, Good air movement bilaterally, CTAB RRR,No Gallops, Rubs or new Murmurs, No Parasternal Heave +ve B.Sounds, Abd Soft, No tenderness, No organomegaly appriciated, No rebound - guarding or rigidity. No Cyanosis, Clubbing or edema, No new Rash or bruise    Data Review   CBC w Diff:  Lab Results  Component Value Date   WBC 6.6 08/21/2019   HGB 14.1 08/21/2019   HCT 43.3 08/21/2019   PLT 360 08/21/2019   LYMPHOPCT 43 08/21/2019   MONOPCT 7 08/21/2019   EOSPCT 1 08/21/2019   BASOPCT 0 08/21/2019    CMP:  Lab Results  Component Value Date   NA 139 08/21/2019   K 4.5 08/21/2019   CL 105 08/21/2019   CO2 20 (L) 08/21/2019   BUN 17 08/21/2019   CREATININE 1.70 (H) 08/21/2019   PROT 6.4 (L) 08/21/2019   ALBUMIN 2.4 (L) 08/21/2019   BILITOT 0.8 08/21/2019   ALKPHOS 59 08/21/2019   AST 41 08/21/2019   ALT 46 (H) 08/21/2019  .   Total Time in preparing paper work, data evaluation and todays exam - 62 minutes  Lala Lund M.D on 08/21/2019 at 9:14 AM  [facility]   Office  [phone]

## 2019-08-21 NOTE — Care Management (Signed)
Patient active w [facility] for PT OT. Placed resumption order and faxed to [place] at [phone]. Notified [facility] that he will DC today.

## 2019-08-21 NOTE — Progress Notes (Signed)
Home Oxygen Evaluation  Pt oxygen saturation resting on RA 100%  Pt oxygen saturation ambulating on RA 98%.  Pt did not require oxygen throughout study.

## 2019-08-21 NOTE — Discharge Instructions (Signed)
Follow with Primary [facility] in 7 days   Get CBC, CMP, 2 view Chest X ray -  checked next visit within 1 week by Primary MD   Activity: As tolerated with Full fall precautions use walker/cane & assistance as needed  Disposition Home    Diet: Heart Healthy    Special Instructions: If you have smoked or chewed Tobacco  in the last 2 yrs please stop smoking, stop any regular Alcohol  and or any Recreational drug use.  On your next visit with your primary care physician please Get Medicines reviewed and adjusted.  Please request your Prim.MD to go over all Hospital Tests and Procedure/Radiological results at the follow up, please get all Hospital records sent to your Prim MD by signing hospital release before you go home.  If you experience worsening of your admission symptoms, develop shortness of breath, life threatening emergency, suicidal or homicidal thoughts you must seek medical attention immediately by calling 911 or calling your MD immediately  if symptoms less severe.  You Must read complete instructions/literature along with all the possible adverse reactions/side effects for all the Medicines you take and that have been prescribed to you. Take any new Medicines after you have completely understood and accpet all the possible adverse reactions/side effects.       Person Under Monitoring Name: Dillon Smith [facility]  Location: [place] [address]   Infection Prevention Recommendations for Individuals Confirmed to have, or Being Evaluated for, 2019 Novel Coronavirus (COVID-19) Infection Who Receive Care at Home  Individuals who are confirmed to have, or are being evaluated for, COVID-19 should follow the prevention steps below until a healthcare provider or local or state health department says they can return to normal activities.  Stay home except to get medical care You should restrict activities outside your home, except for getting medical  care. Do not go to work, school, or public areas, and do not use public transportation or taxis.  Call ahead before visiting your doctor Before your medical appointment, call the healthcare provider and tell them that you have, or are being evaluated for, COVID-19 infection. This will help the healthcare provider's office take steps to keep other people from getting infected. Ask your healthcare provider to call the local or state health department.  Monitor your symptoms Seek prompt medical attention if your illness is worsening (e.g., difficulty breathing). Before going to your medical appointment, call the healthcare provider and tell them that you have, or are being evaluated for, COVID-19 infection. Ask your healthcare provider to call the local or state health department.  Wear a facemask You should wear a facemask that covers your nose and mouth when you are in the same room with other people and when you visit a healthcare provider. People who live with or visit you should also wear a facemask while they are in the same room with you.  Separate yourself from other people in your home As much as possible, you should stay in a different room from other people in your home. Also, you should use a separate bathroom, if available.  Avoid sharing household items You should not share dishes, drinking glasses, cups, eating utensils, towels, bedding, or other items with other people in your home. After using these items, you should wash them thoroughly with soap and water.  Cover your coughs and sneezes Cover your mouth and nose with a tissue when you cough or sneeze, or you can cough or  sneeze into your sleeve. Throw used tissues in a lined trash can, and immediately wash your hands with soap and water for at least 20 seconds or use an alcohol-based hand rub.  Wash your [facility] your hands often and thoroughly with soap and water for at least 20 seconds. You can use an alcohol-based  hand sanitizer if soap and water are not available and if your hands are not visibly dirty. Avoid touching your eyes, nose, and mouth with unwashed hands.   Prevention Steps for Caregivers and Household Members of Individuals Confirmed to have, or Being Evaluated for, COVID-19 Infection Being Cared for in the Home  If you live with, or provide care at home for, a person confirmed to have, or being evaluated for, COVID-19 infection please follow these guidelines to prevent infection:  Follow healthcare provider's instructions Make sure that you understand and can help the patient follow any healthcare provider instructions for all care.  Provide for the patient's basic needs You should help the patient with basic needs in the home and provide support for getting groceries, prescriptions, and other personal needs.  Monitor the patient's symptoms If they are getting sicker, call his or her medical provider and tell them that the patient has, or is being evaluated for, COVID-19 infection. This will help the healthcare provider's office take steps to keep other people from getting infected. Ask the healthcare provider to call the local or state health department.  Limit the number of people who have contact with the patient  If possible, have only one caregiver for the patient.  Other household members should stay in another home or place of residence. If this is not possible, they should stay  in another room, or be separated from the patient as much as possible. Use a separate bathroom, if available.  Restrict visitors who do not have an essential need to be in the home.  Keep older adults, very young children, and other sick people away from the patient Keep older adults, very young children, and those who have compromised immune systems or chronic health conditions away from the patient. This includes people with chronic heart, lung, or kidney conditions, diabetes, and  cancer.  Ensure good ventilation Make sure that shared spaces in the home have good air flow, such as from an air conditioner or an opened window, weather permitting.  Wash your hands often  Wash your hands often and thoroughly with soap and water for at least 20 seconds. You can use an alcohol based hand sanitizer if soap and water are not available and if your hands are not visibly dirty.  Avoid touching your eyes, nose, and mouth with unwashed hands.  Use disposable paper towels to dry your hands. If not available, use dedicated cloth towels and replace them when they become wet.  Wear a facemask and gloves  Wear a disposable facemask at all times in the room and gloves when you touch or have contact with the patient's blood, body fluids, and/or secretions or excretions, such as sweat, saliva, sputum, nasal mucus, vomit, urine, or feces.  Ensure the mask fits over your nose and mouth tightly, and do not touch it during use.  Throw out disposable facemasks and gloves after using them. Do not reuse.  Wash your hands immediately after removing your facemask and gloves.  If your personal clothing becomes contaminated, carefully remove clothing and launder. Wash your hands after handling contaminated clothing.  Place all used disposable facemasks, gloves, and other waste  in a lined container before disposing them with other household waste.  Remove gloves and wash your hands immediately after handling these items.  Do not share dishes, glasses, or other household items with the patient  Avoid sharing household items. You should not share dishes, drinking glasses, cups, eating utensils, towels, bedding, or other items with a patient who is confirmed to have, or being evaluated for, COVID-19 infection.  After the person uses these items, you should wash them thoroughly with soap and water.  Wash laundry thoroughly  Immediately remove and wash clothes or bedding that have blood, body  fluids, and/or secretions or excretions, such as sweat, saliva, sputum, nasal mucus, vomit, urine, or feces, on them.  Wear gloves when handling laundry from the patient.  Read and follow directions on labels of laundry or clothing items and detergent. In general, wash and dry with the warmest temperatures recommended on the label.  Clean all areas the individual has used often  Clean all touchable surfaces, such as counters, tabletops, doorknobs, bathroom fixtures, toilets, phones, keyboards, tablets, and bedside tables, every day. Also, clean any surfaces that may have blood, body fluids, and/or secretions or excretions on them.  Wear gloves when cleaning surfaces the patient has come in contact with.  Use a diluted bleach solution (e.g., dilute bleach with 1 part bleach and 10 parts water) or a household disinfectant with a label that says EPA-registered for coronaviruses. To make a bleach solution at home, add 1 tablespoon of bleach to 1 quart (4 cups) of water. For a larger supply, add  cup of bleach to 1 gallon (16 cups) of water.  Read labels of cleaning products and follow recommendations provided on product labels. Labels contain instructions for safe and effective use of the cleaning product including precautions you should take when applying the product, such as wearing gloves or eye protection and making sure you have good ventilation during use of the product.  Remove gloves and wash hands immediately after cleaning.  Monitor yourself for signs and symptoms of illness Caregivers and household members are considered close contacts, should monitor their health, and will be asked to limit movement outside of the home to the extent possible. Follow the monitoring steps for close contacts listed on the symptom monitoring form.   ? If you have additional questions, contact your local health department or call the epidemiologist on call at [phone] (available 24/7). ? This  guidance is subject to change. For the most up-to-date guidance from [facility], please refer to their website: YouBlogs.pl

## 2019-09-21 ENCOUNTER — Ambulatory Visit (INDEPENDENT_AMBULATORY_CARE_PROVIDER_SITE_OTHER): Payer: Medicare Other | Admitting: Adult Health

## 2019-09-21 ENCOUNTER — Encounter: Payer: Self-pay | Admitting: Adult Health

## 2019-09-21 ENCOUNTER — Other Ambulatory Visit: Payer: Self-pay

## 2019-09-21 VITALS — BP 128/58 | HR 60 | Temp 97.0°F | Ht 71.75 in | Wt 215.0 lb

## 2019-09-21 DIAGNOSIS — I639 Cerebral infarction, unspecified: Secondary | ICD-10-CM

## 2019-09-21 DIAGNOSIS — I10 Essential (primary) hypertension: Secondary | ICD-10-CM

## 2019-09-21 DIAGNOSIS — I4819 Other persistent atrial fibrillation: Secondary | ICD-10-CM | POA: Diagnosis not present

## 2019-09-21 DIAGNOSIS — E782 Mixed hyperlipidemia: Secondary | ICD-10-CM | POA: Diagnosis not present

## 2019-09-21 DIAGNOSIS — I69319 Unspecified symptoms and signs involving cognitive functions following cerebral infarction: Secondary | ICD-10-CM

## 2019-09-21 MED ORDER — APIXABAN 2.5 MG PO TABS: 2.5000 mg | ORAL_TABLET | Freq: Two times a day (BID) | ORAL | 1 refills | Status: DC

## 2019-09-21 NOTE — Patient Instructions (Addendum)
Continue Eliquis (apixaban) daily  and atorvastatin 40 mg daily for secondary stroke prevention  Continue to follow up with PCP regarding cholesterol and blood pressure management  . Referral placed to see cardiology to establish care for atrial fibrillation monitoring and management of eliquis  Recommend monitoring blood pressure at home and if remains low, speak to your PCP about possible need of change to blood pressure medication - you are currently taking amlodepine 10mg  daily   Maintain strict control of hypertension with blood pressure goal below 130/90, diabetes with hemoglobin A1c goal below 6.5% and cholesterol with LDL cholesterol (bad cholesterol) goal below 70 mg/dL. I also advised the patient to eat a healthy diet with plenty of whole grains, cereals, fruits and vegetables, exercise regularly and maintain ideal body weight.  Followup in the future with me in 3 months or call earlier if needed       Thank you for coming to see [name] at [facility]. I hope we have been able to provide you high quality care today.  You may receive a patient satisfaction survey over the next few weeks. We would appreciate your feedback and comments so that we may continue to improve ourselves and the health of our patients.

## 2019-09-21 NOTE — Progress Notes (Signed)
[facility] [address] [place]. [place] [address] [phone]       HOSPITAL FOLLOW UP NOTE  Mr. Dillon Smith Date of Birth:  1929-10-04 Medical Record Number:  [id number]   Reason for Referral:  hospital stroke follow up    CHIEF COMPLAINT:  Chief Complaint  Patient presents with  . Follow-up    RM9. with neighbor/friend. Hosp f/u. Doing well, eating better no concerns.    HPI: Dillon Tise Smith being seen today for in office hospital follow-up regarding multiple right MCA PCA infarcts embolic secondary to AF not on [facility] on 08/15/2019.  History obtained from patient, family friend and chart review. Reviewed all radiology images and labs personally.  Dillon Smith a 86 y.o.malewith history of AF not on AC, CAD, HTNwho presented on 08/15/2019 with dysarthria, L facial droop, L hemiparesis and R gaze preference. Evaluated by stroke team and  Dr. Erlinda Hong with stroke work-up revealing multiple right MCA PCA infarcts likely embolic secondary to AF not on [facility].  Received tPA 08/15/2019 at [id number] without complication.  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  2D echo EF of 45 to 50% without cardiac source of embolus identified.  Previously on aspirin and recommend transitioning to Eliquis with history of AF and for secondary stroke prevention as no obvious contraindication for [facility].  Positive Covid testing upon arrival to ED asymptomatic.  History of HTN stable during admission resuming amlodipine and metoprolol.  LDL 101 increased atorvastatin to 40 mg daily.  No prior history of DM with A1c 6.3.  Other stroke risk factors include advanced age, former tobacco use, overweight, CAD but no prior history of stroke.  Other active problems include CKD stage III.  Residual deficits of mild dysarthria, left facial weakness and dysphagia and discharged home with recommendation of  home health therapies.  Dillon Smith is a 86 year old male who is being seen today for hospital follow-up accompanied by family friend. He has recovered well from a stroke standpoint with mild cognitive impairment but this has greatly been improving.  He continues to live alone but his neighbor will check on him throughout the day and assist as needed.  Continues on Eliquis and atorvastatin 40 mg daily for secondary stroke prevention without side effects.  He does not have a current cardiologist as he is currently followed by [facility] but he is requesting to establish care locally.  Blood pressure today initially 108/57 and on recheck 128/58 with heart rate 60.  Continues on amlodipine 10 mg daily.  He does not routinely monitor at home.  No concerns at this time.    ROS:   14 system review of systems performed and negative with exception of HOH, memory loss  PMH:  Past Medical History:  Diagnosis Date  . A-fib ([place])   . Coronary artery disease   . Hypertension   . Prostate cancer ([place]) 2010    PSH:  Past Surgical History:  Procedure Laterality Date  . COLONOSCOPY  2004  . INSERTION PROSTATE RADIATION SEED  2010    Social History:  Social History   Socioeconomic History  . Marital status: Divorced    Spouse name: Not on file  . Number of children: Not on file  . Years of education: Not on file  . Highest education level: Not on file  Occupational History  . Not on file  Tobacco Use  .  Smoking status: Former Research scientist (life sciences)  . Smokeless tobacco: Never Used  Substance and Sexual Activity  . Alcohol use: Not on file  . Drug use: Not on file  . Sexual activity: Not on file  Other Topics Concern  . Not on file  Social History Narrative  . Not on file   Social Determinants of Health   Financial Resource Strain:   . Difficulty of Paying Living Expenses:   Food Insecurity:   . Worried About Charity fundraiser in the Last Year:   . Arboriculturist in the Last Year:     Transportation Needs:   . Film/video editor (Medical):   Marland Kitchen Lack of Transportation (Non-Medical):   Physical Activity:   . Days of Exercise per Week:   . Minutes of Exercise per Session:   Stress:   . Feeling of Stress :   Social Connections:   . Frequency of Communication with Friends and Family:   . Frequency of Social Gatherings with Friends and Family:   . Attends Religious Services:   . Active Member of Clubs or Organizations:   . Attends Archivist Meetings:   Marland Kitchen Marital Status:   Intimate Partner Violence:   . Fear of Current or Ex-Partner:   . Emotionally Abused:   Marland Kitchen Physically Abused:   . Sexually Abused:     Family History: No family history on file.  Medications:   Current Outpatient Medications on File Prior to Visit  Medication Sig Dispense Refill  . amLODipine (NORVASC) 10 MG tablet Take 10 mg by mouth daily.    Marland Kitchen apixaban (ELIQUIS) 2.5 MG TABS tablet Take 1 tablet (2.5 mg total) by mouth 2 (two) times daily. 60 tablet 2  . atorvastatin (LIPITOR) 40 MG tablet Take 1 tablet (40 mg total) by mouth at bedtime. 30 tablet 2   No current facility-administered medications on file prior to visit.    Allergies:  No Known Allergies   Physical Exam  Vitals:   09/21/19 0752 09/21/19 0819  BP: (!) 108/57 (!) 128/58  Pulse: 78 60  Temp: (!) 97 F (36.1 C)   Weight: 215 lb (97.5 kg)   Height: 5' 11.75" (1.822 m)    Body mass index is 29.36 kg/m. No exam data present  No flowsheet data found.   General: well developed, well nourished,  pleasant elderly African-American male, seated, in no evident distress Head: head normocephalic and atraumatic.   Neck: supple with no carotid or supraclavicular bruits Cardiovascular: irregular rate and rhythm, no murmurs Musculoskeletal: no deformity Skin:  no rash/petichiae Vascular:  Normal pulses all extremities   Neurologic Exam Mental Status: Awake and fully alert.   Slight dysarthria.  Oriented to  place and time. Recent memory subjectively impaired and remote memory intact. Attention span, concentration and fund of knowledge appropriate during visit. Mood and affect appropriate.  Cranial Nerves: Fundoscopic exam reveals sharp disc margins. Pupils equal, briskly reactive to light. Extraocular movements full without nystagmus. Visual fields full to confrontation.  HOH bilaterally. Facial sensation intact. Face, tongue, palate moves normally and symmetrically.  Motor: Normal bulk and tone. Normal strength in all tested extremity muscles. Sensory.: intact to touch , pinprick , position and vibratory sensation.  Coordination: Rapid alternating movements normal in all extremities. Finger-to-nose and heel-to-shin performed accurately bilaterally. Gait and Station: Arises from chair without difficulty. Stance is normal. Gait demonstrates normal stride length and balance without assistive device Reflexes: 1+ and symmetric. Toes downgoing.  NIHSS  1 -mild dysarthria Modified Rankin  2 CHA2DS2-VASc 6 HAS-BLED 2   Diagnostic Data (Labs, Imaging, Testing)   CT head No acute abnormality. ASPECTS 10.   CTA head & neckR M3 occlusion. B ICA bifurcation atherosclerosis w/ R ICA 15% stenosis. ICA siphon atherosclerosis but w/o >15% stenosis. 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 BA >80% stenosis. Aortic atherosclerosis.   CT perfusiondiminished CBV, CBF and MTT in frontal operculum  MRImultifocal R MCA and PCA infarcts. Small vessel disease.   MRAno ELVO. Moderate narrowing BA exaggerated by artifact.   2D EchoEF 45-50%. No source of embolus   LDL101  HgbA1c6.3    ASSESSMENT: Dillon Smith is a 86 y.o. year old male presented with dysarthria, left facial droop, left hemiparesis and right gaze preference on 08/15/2019 with stroke work-up revealing multiple right MCA PCA infarcts likely embolic secondary to known AF not on [facility] therefore Eliquis initiated for secondary stroke  prevention. Vascular risk factors include HTN, HLD, CAD and AF previously not on AC.  He has recovered well from a stroke standpoint with residual mild cognitive impairment but overall greatly improving    PLAN:  1. Right MCA/PCA stroke: Continue Eliquis (apixaban) daily  and atorvastatin for secondary stroke prevention. Maintain strict control of hypertension with blood pressure goal below 130/90, diabetes with hemoglobin A1c goal below 6.5% and cholesterol with LDL cholesterol (bad cholesterol) goal below 70 mg/dL.  I also advised the patient to eat a healthy diet with plenty of whole grains, cereals, fruits and vegetables, exercise regularly with at least 30 minutes of continuous activity daily and maintain ideal body weight. 2. Atrial fibrillation: referral placed to cardiology to establish care -continue Eliquis with refill provided today but cardiology will continue to manage/prescribe once established  3. HTN: Advised to continue current treatment regimen.  Today's BP stable.  Recommend starting to monitor blood pressure at home and if remains low, please follow up with PCP regarding possibly needing to low amlodipine dose  4. HLD: Advised to continue current treatment regimen along with continued follow-up with PCP for future prescribing and monitoring of lipid panel 5. Mild cognitive impairment, poststroke: Discussion regarding importance of managing stroke risk factors and to do memory exercises such as crossword puzzles, word search, card games and reading.  Advised to continue to stay active and maintain a healthy diet.   Follow up in 3 months or call earlier if needed   Greater than 50% of time during this 45 minute visit was spent on counseling, explanation of diagnosis of right MCA/PCA stroke, reviewing risk factor management of atrial fibrillation, HTN and HLD, planning of further management along with potential future management, and discussion with patient and family answering all  questions.    Frann Rider, AGNP-BC  [facility] [address] [place], [place] [address]  Phone [phone] Fax [phone] Note: This document was prepared with digital dictation and possible smart phrase technology. Any transcriptional errors that result from this process are unintentional.

## 2019-10-01 NOTE — Progress Notes (Signed)
I agree with the above plan 

## 2019-10-04 NOTE — Progress Notes (Signed)
Cardiology Office Note   Date:  10/05/2019   ID:  Dillon Smith, DOB 28-Feb-1930, MRN [id number]  PCP:  Clinic, Dillon Smith   Primary cardiologist: Dr. McAlhany/Dr. Rayann Smith  No chief complaint on file.  AFib  Wt Readings from Last 3 Encounters:  10/05/19 219 lb (99.3 kg)  09/21/19 215 lb (97.5 kg)  08/18/19 206 lb 5.6 oz (93.6 kg)       History of Present Illness: Dillon Smith is a 86 y.o. male  With HTN who had a stroke in 2/21.  Seen by Dr. Donnella Smith, and Dr. Rayann Smith at that time.   In 2007, he had CABG x 3 at the [facility].   Diagnosed with AFib- Eliquis for stroke prevention.    Denies : Chest pain. Dizziness. Leg edema. Nitroglycerin use. Orthopnea. Palpitations. Paroxysmal nocturnal dyspnea. Shortness of breath. Syncope.   Reports some problems with writing since his stroke.  Overall, feels that he is doing well.  He still works doing Market researcher work.    Past Medical History:  Diagnosis Date  . A-fib ([place])   . Coronary artery disease   . Hypertension   . Prostate cancer ([place]) 2010    Past Surgical History:  Procedure Laterality Date  . COLONOSCOPY  2004  . INSERTION PROSTATE RADIATION SEED  2010     Current Outpatient Medications  Medication Sig Dispense Refill  . amLODipine (NORVASC) 10 MG tablet Take 10 mg by mouth daily.    Marland Kitchen apixaban (ELIQUIS) 2.5 MG TABS tablet Take 1 tablet (2.5 mg total) by mouth 2 (two) times daily. 180 tablet 1  . atorvastatin (LIPITOR) 40 MG tablet Take 1 tablet (40 mg total) by mouth at bedtime. 30 tablet 2   No current facility-administered medications for this visit.    Allergies:   Patient has no known allergies.    Social History:  The patient  reports that he has quit smoking. He has never used smokeless tobacco.   Family History:  The patient's family history is not on file. Parents are deceased.  No known premature CAD.   ROS:  Please see the history of present illness.   Otherwise, review of systems are  positive for mild dizziness with standing.   All other systems are reviewed and negative.    PHYSICAL EXAM: VS:  BP 120/72   Pulse 82   Ht 5' 11.5" (1.816 m)   Wt 219 lb (99.3 kg)   SpO2 97%   BMI 30.12 kg/m  , BMI Body mass index is 30.12 kg/m. GEN: Well nourished, well developed, in no acute distress  HEENT: normal  Neck: no JVD, carotid bruits, or masses Cardiac: irregularly irregular; no murmurs, rubs, or gallops,; bilateral LE edema  Respiratory:  clear to auscultation bilaterally, normal work of breathing GI: soft, nontender, nondistended, + BS MS: no deformity or atrophy  Skin: warm and dry, no rash Neuro:  Strength and sensation are intact Psych: euthymic mood, full affect   EKG:   The ekg ordered 08/20/19 demonstrates AFib, PVCs   Recent Labs: 08/19/2019: Magnesium 2.3; TSH 2.736 08/21/2019: ALT 46; BUN 17; Creatinine, Ser 1.70; Hemoglobin 14.1; Platelets 360; Potassium 4.5; Sodium 139   Lipid Panel    Component Value Date/Time   CHOL 146 08/16/2019 0242   TRIG 109 08/16/2019 0242   HDL 23 (L) 08/16/2019 0242   CHOLHDL 6.3 08/16/2019 0242   VLDL 22 08/16/2019 0242   LDLCALC 101 (H) 08/16/2019 0242  Other studies Reviewed: Additional studies/ records that were reviewed today with results demonstrating: Hospital records reviewed.   ASSESSMENT AND PLAN:  1. AFib: Rate controlled. Eliquis for stroke prevention.  No symptoms of bradycardia either which was a concern when he was in the hospital. 2. PVCs: No symptoms.   3. Anticoagulated: No bleeding problems.  Due to being anticoagulated, I told him that he should not go up on any ladders.  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 4. CAD: s/p CABG.  COntinue secondary prevention.  We spoke about healthy diet.  Avoiding processed foods.  Eating more of a whole food, plant-based diet would be helpful.  Avoid red meat in particular.   Current medicines are reviewed at length with the patient today.  The  patient concerns regarding his medicines were addressed.  The following changes have been made:  No change  Labs/ tests ordered today include:  No orders of the defined types were placed in this encounter.   Recommend 150 minutes/week of aerobic exercise Low fat, low carb, high fiber diet recommended  Disposition:   FU in 6 months with Dr. Angelena Smith   Signed, Dillon Grooms, MD  10/05/2019 11:39 AM    [address] [place], [place], [place]  [address] Phone: [phone]; Fax: [phone]

## 2019-10-05 ENCOUNTER — Ambulatory Visit (INDEPENDENT_AMBULATORY_CARE_PROVIDER_SITE_OTHER): Payer: Medicare Other | Admitting: Interventional Cardiology

## 2019-10-05 ENCOUNTER — Other Ambulatory Visit: Payer: Self-pay

## 2019-10-05 ENCOUNTER — Encounter: Payer: Self-pay | Admitting: Interventional Cardiology

## 2019-10-05 VITALS — BP 120/72 | HR 82 | Ht 71.5 in | Wt 219.0 lb

## 2019-10-05 DIAGNOSIS — I25118 Atherosclerotic heart disease of native coronary artery with other forms of angina pectoris: Secondary | ICD-10-CM | POA: Diagnosis not present

## 2019-10-05 DIAGNOSIS — I4819 Other persistent atrial fibrillation: Secondary | ICD-10-CM

## 2019-10-05 DIAGNOSIS — Z7901 Long term (current) use of anticoagulants: Secondary | ICD-10-CM

## 2019-10-05 DIAGNOSIS — I493 Ventricular premature depolarization: Secondary | ICD-10-CM

## 2019-10-05 NOTE — Patient Instructions (Signed)
Medication Instructions:  Your physician recommends that you continue on your current medications as directed. Please refer to the Current Medication list given to you today.  *If you need a refill on your cardiac medications before your next appointment, please call your pharmacy*   Lab Work: None ordered  If you have labs (blood work) drawn today and your tests are completely normal, you will receive your results only by: Marland Kitchen MyChart Message (if you have MyChart) OR . A paper copy in the mail If you have any lab test that is abnormal or we need to change your treatment, we will call you to review the results.   Testing/Procedures: None ordered   Follow-Up: At [facility], you and your health needs are our priority.  As part of our continuing mission to provide you with exceptional heart care, we have created designated Provider Care Teams.  These Care Teams include your primary Cardiologist (physician) and Advanced Practice Providers (APPs -  Physician Assistants and Nurse Practitioners) who all work together to provide you with the care you need, when you need it.  We recommend signing up for the patient portal called "MyChart".  Sign up information is provided on this After Visit Summary.  MyChart is used to connect with patients for Virtual Visits (Telemedicine).  Patients are able to view lab/test results, encounter notes, upcoming appointments, etc.  Non-urgent messages can be sent to your provider as well.   To learn more about what you can do with MyChart, go to [email or web address].    Your next appointment:   6 month(s)  The format for your next appointment:   In Person  Provider:   Lauree Chandler, MD   Other Instructions  High-Fiber Diet Fiber, also called dietary fiber, is a type of carbohydrate that is found in fruits, vegetables, whole grains, and beans. A high-fiber diet can have many health benefits. Your health care provider may recommend a high-fiber  diet to help:  Prevent constipation. Fiber can make your bowel movements more regular.  Lower your cholesterol.  Relieve the following conditions: ? Swelling of veins in the anus (hemorrhoids). ? Swelling and irritation (inflammation) of specific areas of the digestive tract (uncomplicated diverticulosis). ? A problem of the large intestine (colon) that sometimes causes pain and diarrhea (irritable bowel syndrome, IBS).  Prevent overeating as part of a weight-loss plan.  Prevent heart disease, type 2 diabetes, and certain cancers. What is my plan? The recommended daily fiber intake in grams (g) includes:  38 g for men age 21 or younger.  30 g for men over age 11.  73 g for women age 48 or younger.  21 g for women over age 24. You can get the recommended daily intake of dietary fiber by:  Eating a variety of fruits, vegetables, grains, and beans.  Taking a fiber supplement, if it is not possible to get enough fiber through your diet. What do I need to know about a high-fiber diet?  It is better to get fiber through food sources rather than from fiber supplements. There is not a lot of research about how effective supplements are.  Always check the fiber content on the nutrition facts label of any prepackaged food. Look for foods that contain 5 g of fiber or more per serving.  Talk with a diet and nutrition specialist (dietitian) if you have questions about specific foods that are recommended or not recommended for your medical condition, especially if those foods are not listed below.  Gradually increase how much fiber you consume. If you increase your intake of dietary fiber too quickly, you may have bloating, cramping, or gas.  Drink plenty of water. Water helps you to digest fiber. What are tips for following this plan?  Eat a wide variety of high-fiber foods.  Make sure that half of the grains that you eat each day are whole grains.  Eat breads and cereals that are  made with whole-grain flour instead of refined flour or white flour.  Eat brown rice, bulgur wheat, or millet instead of white rice.  Start the day with a breakfast that is high in fiber, such as a cereal that contains 5 g of fiber or more per serving.  Use beans in place of meat in soups, salads, and pasta dishes.  Eat high-fiber snacks, such as berries, raw vegetables, nuts, and popcorn.  Choose whole fruits and vegetables instead of processed forms like juice or sauce. What foods can I eat?  Fruits Berries. Pears. Apples. Oranges. Avocado. Prunes and raisins. Dried figs. Vegetables Sweet potatoes. Spinach. Kale. Artichokes. Cabbage. Broccoli. Cauliflower. Green peas. Carrots. Squash. Grains Whole-grain breads. Multigrain cereal. Oats and oatmeal. Brown rice. Barley. Bulgur wheat. [place]. Quinoa. Bran muffins. Popcorn. Rye wafer crackers. Meats and other proteins Navy, kidney, and pinto beans. Soybeans. Split peas. Lentils. Nuts and seeds. Dairy Fiber-fortified yogurt. Beverages Fiber-fortified soy milk. Fiber-fortified orange juice. Other foods Fiber bars. The items listed above may not be a complete list of recommended foods and beverages. Contact a dietitian for more options. What foods are not recommended? Fruits Fruit juice. Cooked, strained fruit. Vegetables Fried potatoes. Canned vegetables. Well-cooked vegetables. Grains White bread. Pasta made with refined flour. White rice. Meats and other proteins Fatty cuts of meat. Fried chicken or fried fish. Dairy Milk. Yogurt. Cream cheese. Sour cream. Fats and oils Butters. Beverages Soft drinks. Other foods Cakes and pastries. The items listed above may not be a complete list of foods and beverages to avoid. Contact a dietitian for more information. Summary  Fiber is a type of carbohydrate. It is found in fruits, vegetables, whole grains, and beans.  There are many health benefits of eating a high-fiber diet, such  as preventing constipation, lowering blood cholesterol, helping with weight loss, and reducing your risk of heart disease, diabetes, and certain cancers.  Gradually increase your intake of fiber. Increasing too fast can result in cramping, bloating, and gas. Drink plenty of water while you increase your fiber.  The best sources of fiber include whole fruits and vegetables, whole grains, nuts, seeds, and beans. This information is not intended to replace advice given to you by your health care provider. Make sure you discuss any questions you have with your health care provider. Document Revised: 04/21/2017 Document Reviewed: 04/21/2017 Elsevier Patient Education  2020 [employer].

## 2019-11-24 ENCOUNTER — Other Ambulatory Visit: Payer: Self-pay

## 2019-11-24 NOTE — Patient Outreach (Addendum)
First telephone outreach attempt to obtain mRS. No answer. Unable to leave voicemail for return call due to no voicemail option.  Ina Homes [facility] Management Assistant [phone]

## 2019-11-26 ENCOUNTER — Other Ambulatory Visit: Payer: Self-pay

## 2019-11-26 NOTE — Patient Outreach (Signed)
Telephone outreach to patient to obtain mRS was successfully completed. MRS=0  Ina Homes [facility] Management Assistant [phone]

## 2019-12-20 ENCOUNTER — Ambulatory Visit: Payer: No Typology Code available for payment source | Admitting: Adult Health

## 2019-12-21 ENCOUNTER — Encounter: Payer: Self-pay | Admitting: Adult Health

## 2019-12-21 ENCOUNTER — Other Ambulatory Visit: Payer: Self-pay

## 2019-12-21 ENCOUNTER — Ambulatory Visit (INDEPENDENT_AMBULATORY_CARE_PROVIDER_SITE_OTHER): Payer: Medicare Other | Admitting: Adult Health

## 2019-12-21 VITALS — BP 144/66 | HR 47 | Wt 215.0 lb

## 2019-12-21 DIAGNOSIS — Z8673 Personal history of transient ischemic attack (TIA), and cerebral infarction without residual deficits: Secondary | ICD-10-CM | POA: Diagnosis not present

## 2019-12-21 DIAGNOSIS — I10 Essential (primary) hypertension: Secondary | ICD-10-CM

## 2019-12-21 DIAGNOSIS — I4819 Other persistent atrial fibrillation: Secondary | ICD-10-CM

## 2019-12-21 DIAGNOSIS — E785 Hyperlipidemia, unspecified: Secondary | ICD-10-CM | POA: Diagnosis not present

## 2019-12-21 DIAGNOSIS — I69319 Unspecified symptoms and signs involving cognitive functions following cerebral infarction: Secondary | ICD-10-CM

## 2019-12-21 NOTE — Progress Notes (Signed)
[facility] [address] [place]. [place] [address] [phone]       STROKE FOLLOW UP NOTE  Mr. Dillon Smith Date of Birth:  June 26, 1930 Medical Record Number:  [id number]   Reason for Referral: stroke follow up    CHIEF COMPLAINT:  Chief Complaint  Patient presents with  . Follow-up    corner rm here for a stoke f/u.Pt is off balance.     HPI:   Today, 12/21/2019, Dillon Smith returns for follow-up regarding right MCA and PCA infarcts secondary to A. fib not on AC in 08/2019.  He is accompanied by his neighbor.  He has been stable since prior visit with short term memory impairment and delayed recall, mild imbalance and mild difficulty with writing all present since his stroke but does endorse gradual improvement.  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  Continues to live independently with his neighbor checking on him throughout the day.  Continues on Eliquis for secondary stroke prevention and history of atrial fibrillation without bleeding or bruising.  Continues on atorvastatin 40 mg daily without myalgias.  He has not had repeat lipid panel since hospitalization.  Blood pressure today 144/66. He continues on amlodipine 10mg  daily. He has since established care with cardiology for monitoring management of A. fib and hx of CAD.  No concerns at this time.    History copied from prior notes for reference purposes only Initial visit 09/21/2019 JM: Dillon Smith is a 86 year old male who is being seen today for hospital follow-up accompanied by family friend. He has recovered well from a stroke standpoint with mild cognitive impairment but this has greatly been improving.  He continues to live alone but his neighbor will check on him throughout the day and assist as needed.  Continues on Eliquis and atorvastatin 40 mg daily for secondary stroke prevention without side effects.  He does not have a current cardiologist as he is currently  followed by [facility] but he is requesting to establish care locally.  Blood pressure today initially 108/57 and on recheck 128/58 with heart rate 60.  Continues on amlodipine 10 mg daily.  He does not routinely monitor at home.  No concerns at this time.  Stroke admission 08/15/2019: DillonDillon Smith a 86 y.o.malewith history of AF not on AC, CAD, HTNwho presented on 08/15/2019 with dysarthria, L facial droop, L hemiparesis and R gaze preference. Evaluated by stroke team and  Dr. Erlinda Hong with stroke work-up revealing multiple right MCA PCA infarcts likely embolic secondary to AF not on [facility].  Received tPA 08/15/2019 at 5102 without complication.  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  2D echo EF of 45 to 50% without cardiac source of embolus identified.  Previously on aspirin and recommend transitioning to Eliquis with history of AF and for secondary stroke prevention as no obvious contraindication for [facility].  Positive Covid testing upon arrival to ED asymptomatic.  History of HTN stable during admission resuming amlodipine and metoprolol.  LDL 101 increased atorvastatin to 40 mg daily.  No prior history of DM with A1c 6.3.  Other stroke risk factors include advanced age, former tobacco use, overweight, CAD but no prior history of stroke.  Other active problems include CKD stage III.  Residual deficits of mild dysarthria, left facial weakness and dysphagia and discharged home with recommendation of home health therapies.  Stroke:multiple R MCA  and PCA infarctslikelyembolic secondary toAF not on AC  CT head No acute abnormality. ASPECTS 10.   CTA head & neckR M3 occlusion. B ICA bifurcation atherosclerosis w/ R ICA 15% stenosis. ICA siphon atherosclerosis but w/o >15% stenosis. 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 BA >80% stenosis. Aortic atherosclerosis.   CT perfusiondiminished CBV,  CBF and MTT in frontal operculum  MRImultifocal R MCA and PCA infarcts. Small vessel disease.   MRAno ELVO. Moderate narrowing BA exaggerated by artifact.   2D EchoEF 45-50%. No source of embolus   LDL101  HgbA1c6.3   ROS:   14 system review of systems performed and negative with exception of HOH, memory loss, imbalance  PMH:  Past Medical History:  Diagnosis Date  . A-fib ([place])   . Coronary artery disease   . Hypertension   . Prostate cancer ([place]) 2010    PSH:  Past Surgical History:  Procedure Laterality Date  . COLONOSCOPY  2004  . INSERTION PROSTATE RADIATION SEED  2010    Social History:  Social History   Socioeconomic History  . Marital status: Divorced    Spouse name: Not on file  . Number of children: Not on file  . Years of education: Not on file  . Highest education level: Not on file  Occupational History  . Not on file  Tobacco Use  . Smoking status: Former Research scientist (life sciences)  . Smokeless tobacco: Never Used  Substance and Sexual Activity  . Alcohol use: Not on file  . Drug use: Not on file  . Sexual activity: Not on file  Other Topics Concern  . Not on file  Social History Narrative  . Not on file   Social Determinants of Health   Financial Resource Strain:   . Difficulty of Paying Living Expenses:   Food Insecurity:   . Worried About Charity fundraiser in the Last Year:   . Arboriculturist in the Last Year:   Transportation Needs:   . Film/video editor (Medical):   Marland Kitchen Lack of Transportation (Non-Medical):   Physical Activity:   . Days of Exercise per Week:   . Minutes of Exercise per Session:   Stress:   . Feeling of Stress :   Social Connections:   . Frequency of Communication with Friends and Family:   . Frequency of Social Gatherings with Friends and Family:   . Attends Religious Services:   . Active Member of Clubs or Organizations:   . Attends Archivist Meetings:   Marland Kitchen Marital Status:   Intimate Partner Violence:    . Fear of Current or Ex-Partner:   . Emotionally Abused:   Marland Kitchen Physically Abused:   . Sexually Abused:     Family History: No family history on file.  Medications:   Current Outpatient Medications on File Prior to Visit  Medication Sig Dispense Refill  . amLODipine (NORVASC) 10 MG tablet Take 10 mg by mouth daily.    Marland Kitchen apixaban (ELIQUIS) 2.5 MG TABS tablet Take 1 tablet (2.5 mg total) by mouth 2 (two) times daily. 180 tablet 1  . atorvastatin (LIPITOR) 40 MG tablet Take 1 tablet (40 mg total) by mouth at bedtime. 30 tablet 2   No current facility-administered medications on file prior to visit.    Allergies:  No Known Allergies   Physical Exam  Vitals:   12/21/19 0733  BP: (!) 144/66  Pulse: (!) 47  Weight: 215 lb (97.5 kg)   Body mass  index is 29.57 kg/m. No exam data present  General: well developed, well nourished, pleasant elderly African-American male, seated, in no evident distress Head: head normocephalic and atraumatic.   Neck: supple with no carotid or supraclavicular bruits Cardiovascular: irregular rate and rhythm, no murmurs Musculoskeletal: no deformity Skin:  no rash/petichiae Vascular:  Normal pulses all extremities   Neurologic Exam Mental Status: Awake and fully alert. Oriented to place and time. Recent memory impaired and remote memory intact. Attention span, concentration and fund of knowledge appropriate during visit. Mood and affect appropriate. Recall: 1/3 - with prompting 2/3.  Animal naming 10 in 60 seconds.  Cranial Nerves: Pupils equal, briskly reactive to light. Extraocular movements full without nystagmus. Visual fields full to confrontation.  HOH bilaterally. Facial sensation intact. Face, tongue, palate moves normally and symmetrically.  Motor: Normal bulk and tone. Normal strength in all tested extremity muscles except slight right hand finger weakness. Sensory.: intact to touch , pinprick , position and vibratory sensation.  Coordination:  Rapid alternating movements normal in all extremities except slightly decreased right hand. Finger-to-nose and heel-to-shin performed accurately bilaterally. Gait and Station: Arises from chair without difficulty. Stance is normal. Gait demonstrates normal stride length and balance without assistive device Reflexes: 1+ and symmetric. Toes downgoing.       ASSESSMENT: Dillon Smith is a 86 y.o. year old male presented with dysarthria, left facial droop, left hemiparesis and right gaze preference on 08/15/2019 with stroke work-up revealing multiple right MCA PCA infarcts likely embolic secondary to known AF not on [facility] therefore Eliquis initiated for secondary stroke prevention. Vascular risk factors include HTN, HLD, CAD s/p CABG and AF previously not on AC.  Residual deficits of mild cognitive impairment with short-term memory loss and delayed recall, mildly decreased right hand dexterity and mild imbalance but overall greatly improving    PLAN:  1. Right MCA/PCA stroke:  -In regards to cognitive impairment, discussion regarding importance of managing stroke risk factors, staying active, adequate exercise, eating healthy and daily memory exercises such as crossword puzzles, word search, card games and reading.  Also provided fine motor control exercises for possible frontal benefit of right hand.  Continue Eliquis (apixaban) daily  and atorvastatin for secondary stroke prevention. Maintain strict control of hypertension with blood pressure goal below 130/90, diabetes with hemoglobin A1c goal below 6.5% and cholesterol with LDL cholesterol (bad cholesterol) goal below 70 mg/dL.  I also advised the patient to eat a healthy diet with plenty of whole grains, cereals, fruits and vegetables, exercise regularly with at least 30 minutes of continuous activity daily and maintain ideal body weight. 2. Atrial fibrillation: Continue Eliquis and ongoing follow-up with cardiology for prescribing, monitoring  management 3. HTN: Stable.  Continue to monitor at home and ongoing follow-up with PCP for further monitoring and management 4. HLD: Obtain lipid panel today to ensure satisfactory management.  Continue atorvastatin and ongoing follow-up with PCP for prescribing, monitoring and management    Follow up in 6 months or call earlier if needed  CC: [facility], [name] PCP Antony Contras, MD [place] provider  I spent 35 minutes of face-to-face and non-face-to-face time with patient and his neighbor.  This included previsit chart review, lab review, study review, order entry, electronic health record documentation, patient education regarding prior stroke, residual deficits, importance of managing stroke risk factors and answered all questions to patient and neighbor satisfaction   Frann Rider, A [place]  [facility] [address] [place], [place] [address]  Phone [phone] Fax [phone] Note: This document was prepared with digital dictation and possible smart phrase technology. Any transcriptional errors that result from this process are unintentional.

## 2019-12-21 NOTE — Patient Instructions (Signed)
Highly recommend doing memory exercises such as crossword puzzles, word search, card games and reading as well as fine motor control activities which was further provided today.  You may also look online for further activities to further assist vascular cognitive impairment and short-term memory concerns  Continue Eliquis (apixaban) daily prescribed by cardiology and atorvastatin 40 mg daily prescribed by PCP for secondary stroke prevention  Continue to follow up with PCP regarding blood pressure and cholesterol management   We will check your cholesterol levels today for ensure adequate management  Continue to follow with cardiology for atrial fibrillation and Eliquis management  Continue to monitor blood pressure at home  Maintain strict control of hypertension with blood pressure goal below 130/90, diabetes with hemoglobin A1c goal below 6.5% and cholesterol with LDL cholesterol (bad cholesterol) goal below 70 mg/dL. I also advised the patient to eat a healthy diet with plenty of whole grains, cereals, fruits and vegetables, exercise regularly and maintain ideal body weight.  Followup in the future with me in 6 months or call earlier if needed       Thank you for coming to see [name] at [facility]. I hope we have been able to provide you high quality care today.  You may receive a patient satisfaction survey over the next few weeks. We would appreciate your feedback and comments so that we may continue to improve ourselves and the health of our patients.

## 2019-12-21 NOTE — Progress Notes (Signed)
I agree with the above plan 

## 2019-12-22 ENCOUNTER — Telehealth: Payer: Self-pay

## 2019-12-22 LAB — LIPID PANEL
Chol/HDL Ratio: 2.5 ratio (ref 0.0–5.0)
Cholesterol, Total: 128 mg/dL (ref 100–199)
HDL: 52 mg/dL (ref >39)
LDL Chol Calc (NIH): 65 mg/dL (ref 0–99)
Triglycerides: 46 mg/dL (ref 0–149)
VLDL Cholesterol Cal: 11 mg/dL (ref 5–40)

## 2019-12-22 NOTE — Telephone Encounter (Signed)
Attempted to call pt, LVM  

## 2019-12-22 NOTE — Telephone Encounter (Signed)
-----   Message from Frann Rider, NP sent at 12/22/2019  8:20 AM EDT ----- Please advise patient that recent cholesterol level showed satisfactory LDL or bad cholesterol at 65 with goal of less than 70.  Please advised to continue current regimen with atorvastatin

## 2019-12-23 NOTE — Progress Notes (Signed)
Phone lines are still down. I have sent a letter of the results by mail.

## 2019-12-28 NOTE — Progress Notes (Signed)
See phone note

## 2020-02-01 ENCOUNTER — Ambulatory Visit
Admission: EM | Admit: 2020-02-01 | Discharge: 2020-02-01 | Disposition: A | Discharge status: Home / Self Care | Payer: No Typology Code available for payment source | Attending: Physician Assistant | Admitting: Physician Assistant

## 2020-02-01 DIAGNOSIS — Z5189 Encounter for other specified aftercare: Secondary | ICD-10-CM | POA: Diagnosis not present

## 2020-02-01 DIAGNOSIS — T63441A Toxic effect of venom of bees, accidental (unintentional), initial encounter: Secondary | ICD-10-CM

## 2020-02-01 MED ORDER — PREDNISONE 50 MG PO TABS: 50.0000 mg | ORAL_TABLET | Freq: Every day | ORAL | 0 refills | Status: DC

## 2020-02-01 NOTE — Discharge Instructions (Signed)
No alarming signs on exam. Start prednisone as directed, this will help with itching and swelling. Ice compress to the swollen areas to help with symptoms. Take over the counter zyrtec or benadryl for the next 2 days for itching. Monitor for any worsening symptoms, spreading redness, warmth, fever, follow up for reevaluation.

## 2020-02-01 NOTE — ED Provider Notes (Signed)
[facility]    CSN: [id number] Arrival date & time: 02/01/20  1039      History   Chief Complaint Chief Complaint  Patient presents with  . Insect Bite    HPI Dillon Smith is a 86 y.o. male.   86 year old male with history of A. fib on Eliquis, CAD, HTN, prostate cancer, comes in for evaluation after multiple bee stings this morning.  Was checking AC unit when he sustained multiple bee stings.  States itching and sensation with swelling to the left ankle and right ear.  Denies any pain.  Denies swelling to throat, tripoding, drooling, trismus.  Denies shortness of breath, trouble breathing.     Past Medical History:  Diagnosis Date  . A-fib ([place])   . Coronary artery disease   . Hypertension   . Prostate cancer [facility]) 2010    Patient Active Problem List   Diagnosis Date Noted  . 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 08/19/2019  . Symptomatic bradycardia 08/19/2019  . Bradycardia 08/18/2019  . COVID-19 virus infection 08/17/2019  . Atrial fibrillation ([place]) 08/17/2019  . Hyperlipidemia 08/17/2019  . Chronic kidney disease (CKD) stage G3b/A2, moderately decreased glomerular filtration rate (GFR) between 30-44 mL/min/1.73 square meter and albuminuria creatinine ratio between 30-299 mg/g 08/17/2019  . Acute ischemic stroke (HCC) mult R MCA and PCA embolic infarcts d/t AF not on AC s/p tPA 08/15/2019  . CARCINOMA, PROSTATE 09/15/2007  . Essential hypertension 09/15/2007  . Coronary artery disease 09/15/2007  . HEMORRHOIDS, INTERNAL 09/15/2007  . RADIATION PROCTITIS 09/15/2007    Past Surgical History:  Procedure Laterality Date  . COLONOSCOPY  2004  . INSERTION PROSTATE RADIATION SEED  2010       Home Medications    Prior to Admission medications   Medication Sig Start Date End Date Taking? Authorizing Provider  amLODipine (NORVASC) 10 MG tablet Take 10 mg by mouth daily. 06/01/19   [provider]  apixaban (ELIQUIS)  2.5 MG TABS tablet Take 1 tablet (2.5 mg total) by mouth 2 (two) times daily. 09/21/19   Frann Rider, NP  atorvastatin (LIPITOR) 40 MG tablet Take 1 tablet (40 mg total) by mouth at bedtime. 08/17/19   Garvin Fila, MD  predniSONE (DELTASONE) 50 MG tablet Take 1 tablet (50 mg total) by mouth daily with breakfast. 02/01/20   Ok Edwards, PA-C    Family History History reviewed. No pertinent family history.  Social History Social History   Tobacco Use  . Smoking status: Former Research scientist (life sciences)  . Smokeless tobacco: Never Used  Substance Use Topics  . Alcohol use: Not Currently  . Drug use: Not Currently     Allergies   Patient has no known allergies.   Review of Systems Review of Systems  Reason unable to perform ROS: See HPI as above.     Physical Exam Triage Vital Signs ED Triage Vitals [02/01/20 1058]  Enc Vitals Group     BP (!) 167/101     Pulse Rate 81     Resp 18     Temp 98 F (36.7 C)     Temp Source Oral     SpO2 97 %     Weight      Height      Head Circumference      Peak Flow      Pain Score 0     Pain Loc      Pain Edu?      Excl.  in [place]?    No data found.  Updated Vital Signs BP (!) 167/101 (BP Location: Left Arm)   Pulse 81   Temp 98 F (36.7 C) (Oral)   Resp 18   SpO2 97%   Physical Exam Constitutional:      General: He is not in acute distress.    Appearance: Normal appearance. He is well-developed. He is not toxic-appearing or diaphoretic.  HENT:     Head: Normocephalic and atraumatic.     Mouth/Throat:     Comments: Handling own secretions well Eyes:     Conjunctiva/sclera: Conjunctivae normal.     Pupils: Pupils are equal, round, and reactive to light.  Pulmonary:     Effort: Pulmonary effort is normal. No respiratory distress.  Musculoskeletal:     Cervical back: Normal range of motion and neck supple.  Skin:    General: Skin is warm and dry.     Comments: Right ear and left lateral ankle with mild swelling. No erythema, warmth. No  swelling/obvious sting site to BUE. No rashes seen  Neurological:     Mental Status: He is alert and oriented to person, place, and time.      UC Treatments / Results  Labs (all labs ordered are listed, but only abnormal results are displayed) Labs Reviewed - No data to display  EKG   Radiology No results found.  Procedures Procedures (including critical care time)  Medications Ordered in UC Medications - No data to display  Initial Impression / Assessment and Plan / UC Course  I have reviewed the triage vital signs and the nursing notes.  Pertinent labs & imaging results that were available during my care of the patient were reviewed by me and considered in my medical decision making (see chart for details).    Prednisone as directed given swelling.  Other symptomatic treatment discussed.  Return precautions given.  Patient expresses understanding agrees with plan.  Final Clinical Impressions(s) / UC Diagnoses   Final diagnoses:  Visit for wound check  Bee sting, accidental or unintentional, initial encounter    ED Prescriptions    Medication Sig Dispense Auth. Provider   predniSONE (DELTASONE) 50 MG tablet Take 1 tablet (50 mg total) by mouth daily with breakfast. 5 tablet Ok Edwards, PA-C     PDMP not reviewed this encounter.   Ok Edwards, PA-C 02/01/20 1346

## 2020-02-01 NOTE — ED Triage Notes (Signed)
Pt states had multiple bee stings to bilateral hands, rt arm, and behind rt ear around 4mins ago. Pt denies any swelling to throat or difficulty breathing.

## 2020-05-24 ENCOUNTER — Telehealth: Payer: Self-pay | Admitting: *Deleted

## 2020-05-24 ENCOUNTER — Encounter: Payer: Self-pay | Admitting: Adult Health

## 2020-05-24 NOTE — Telephone Encounter (Signed)
Pt seen 09-21-19 and 12-21-2019 in our office for stroke f/u.  Bp readings recorded as in chart.  JM/NP signed (we do not manage Bp medications this would be pcp).  Did sign form, to get credit $50 in to pts wellness incentive account. [name], SO here with form.completed signed. To MR for scan.

## 2020-06-02 ENCOUNTER — Ambulatory Visit: Payer: 59 | Admitting: Cardiovascular Disease

## 2020-06-02 NOTE — Progress Notes (Deleted)
No chief complaint on file.   History of Present Illness: 86 yo male with history of HTN, stroke, CKD, prostate cancer, CAD s/p 3V CABG, atrial fibrillation who is here today for follow up. He had 3V CABG in 2007 at the [facility]. He had a stroke in February 2021 treated with TPA and was diagnosed with atriall fibrillation at that time. He has been on Eliquis since then. Bradycardia noted when admitted but not felt to need a pacemaker. I met him once as a consult in February 2021 in the hospital. He has since been seen in our office by Dr. Irish Lack.   He is here today for follow up. The patient denies any chest pain, dyspnea, palpitations, lower extremity edema, orthopnea, PND, dizziness, near syncope or syncope.   Primary Care Physician: [name]   Past Medical History:  Diagnosis Date  . A-fib ([place])   . Coronary artery disease   . Hypertension   . Prostate cancer ([place]) 2010    Past Surgical History:  Procedure Laterality Date  . COLONOSCOPY  2004  . INSERTION PROSTATE RADIATION SEED  2010    Current Outpatient Medications  Medication Sig Dispense Refill  . amLODipine (NORVASC) 10 MG tablet Take 10 mg by mouth daily.    Marland Kitchen apixaban (ELIQUIS) 2.5 MG TABS tablet Take 1 tablet (2.5 mg total) by mouth 2 (two) times daily. 180 tablet 1  . atorvastatin (LIPITOR) 40 MG tablet Take 1 tablet (40 mg total) by mouth at bedtime. 30 tablet 2  . predniSONE (DELTASONE) 50 MG tablet Take 1 tablet (50 mg total) by mouth daily with breakfast. 5 tablet 0   No current facility-administered medications for this visit.    No Known Allergies  Social History   Socioeconomic History  . Marital status: Divorced    Spouse name: Not on file  . Number of children: Not on file  . Years of education: Not on file  . Highest education level: Not on file  Occupational History  . Not on file  Tobacco Use  . Smoking status: Former Research scientist (life sciences)  . Smokeless tobacco: Never Used  Substance and  Sexual Activity  . Alcohol use: Not Currently  . Drug use: Not Currently  . Sexual activity: Not on file  Other Topics Concern  . Not on file  Social History Narrative  . Not on file   Social Determinants of Health   Financial Resource Strain:   . Difficulty of Paying Living Expenses: Not on file  Food Insecurity:   . Worried About Charity fundraiser in the Last Year: Not on file  . Ran Out of Food in the Last Year: Not on file  Transportation Needs:   . Lack of Transportation (Medical): Not on file  . Lack of Transportation (Non-Medical): Not on file  Physical Activity:   . Days of Exercise per Week: Not on file  . Minutes of Exercise per Session: Not on file  Stress:   . Feeling of Stress : Not on file  Social Connections:   . Frequency of Communication with Friends and Family: Not on file  . Frequency of Social Gatherings with Friends and Family: Not on file  . Attends Religious Services: Not on file  . Active Member of Clubs or Organizations: Not on file  . Attends Archivist Meetings: Not on file  . Marital Status: Not on file  Intimate Partner Violence:   . Fear of Current or Ex-Partner: Not on  file  . Emotionally Abused: Not on file  . Physically Abused: Not on file  . Sexually Abused: Not on file    No family history on file.  Review of Systems:  As stated in the HPI and otherwise negative.   There were no vitals taken for this visit.  Physical Examination: General: Well developed, well nourished, NAD  HEENT: OP clear, mucus membranes moist  SKIN: warm, dry. No rashes. Neuro: No focal deficits  Musculoskeletal: Muscle strength 5/5 all ext  Psychiatric: Mood and affect normal  Neck: No JVD, no carotid bruits, no thyromegaly, no lymphadenopathy.  Lungs:Clear bilaterally, no wheezes, rhonci, crackles Cardiovascular: Regular rate and rhythm. No murmurs, gallops or rubs. Abdomen:Soft. Bowel sounds present. Non-tender.  Extremities: No lower  extremity edema. Pulses are 2 + in the bilateral DP/PT.  EKG:  EKG {ACTION; IS/IS QMV:[id number]} ordered today. The ekg ordered today demonstrates ***  Recent Labs: 08/19/2019: Magnesium 2.3; TSH 2.736 08/21/2019: ALT 46; BUN 17; Creatinine, Ser 1.70; Hemoglobin 14.1; Platelets 360; Potassium 4.5; Sodium 139   Lipid Panel    Component Value Date/Time   CHOL 128 12/21/2019 0815   TRIG 46 12/21/2019 0815   HDL 52 12/21/2019 0815   CHOLHDL 2.5 12/21/2019 0815   CHOLHDL 6.3 08/16/2019 0242   VLDL 22 08/16/2019 0242   LDLCALC 65 12/21/2019 0815     Wt Readings from Last 3 Encounters:  12/21/19 215 lb (97.5 kg)  10/05/19 219 lb (99.3 kg)  09/21/19 215 lb (97.5 kg)      Assessment and Plan:   1. Atrial fibrillation: Rate is ok on no rate controlling agents. Continue Eliquis.   2. CAD s/p CABG: No angina. Continue statin. No beta blocker with history of bradycardia. No ASA since he is on Eliquis.   Current medicines are reviewed at length with the patient today.  The patient {ACTIONS; HAS/DOES NOT HAVE:19233} concerns regarding medicines.  The following changes have been made:  {PLAN; NO CHANGE:13088:s}  Labs/ tests ordered today include: *** No orders of the defined types were placed in this encounter.    Disposition:   FU with me in 12 months.    Signed, Lauree Chandler, MD 06/02/2020 6:38 AM    [facility] [place], [place], [place]  [address] Phone: [phone]; Fax: [phone]

## 2020-06-08 ENCOUNTER — Ambulatory Visit (INDEPENDENT_AMBULATORY_CARE_PROVIDER_SITE_OTHER): Payer: Medicare Other | Admitting: Adult Health

## 2020-06-08 ENCOUNTER — Encounter: Payer: Self-pay | Admitting: Adult Health

## 2020-06-08 VITALS — BP 138/88 | HR 88 | Ht 71.0 in | Wt 215.0 lb

## 2020-06-08 DIAGNOSIS — E785 Hyperlipidemia, unspecified: Secondary | ICD-10-CM

## 2020-06-08 DIAGNOSIS — Z8673 Personal history of transient ischemic attack (TIA), and cerebral infarction without residual deficits: Secondary | ICD-10-CM | POA: Diagnosis not present

## 2020-06-08 DIAGNOSIS — I10 Essential (primary) hypertension: Secondary | ICD-10-CM | POA: Diagnosis not present

## 2020-06-08 DIAGNOSIS — I4819 Other persistent atrial fibrillation: Secondary | ICD-10-CM

## 2020-06-08 NOTE — Patient Instructions (Addendum)
Continue Eliquis (apixaban) daily  and atorvastatin 40 mg daily for secondary stroke prevention  Continue to follow with cardiology for atrial fibrillation and Eliquis management  Continue to follow up with PCP regarding cholesterol and blood pressure management  Maintain strict control of hypertension with blood pressure goal below 130/90 and cholesterol with LDL cholesterol (bad cholesterol) goal below 70 mg/dL.    Follow up in 1 year or call earlier if needed   Thank you for coming to see [name] at [facility]. I hope we have been able to provide you high quality care today.  You may receive a patient satisfaction survey over the next few weeks. We would appreciate your feedback and comments so that we may continue to improve ourselves and the health of our patients.

## 2020-06-08 NOTE — Progress Notes (Signed)
[facility] [address] [place]. [place] [address] [phone]       STROKE FOLLOW UP NOTE  Mr. ONIEL MELESKI Date of Birth:  [birth year] Medical Record Number:  [id number]   Reason for Referral: stroke follow up    CHIEF COMPLAINT:  Chief Complaint  Patient presents with  . Follow-up    Tx rm, with neighbor lisa, pt states he is doing well, no new concerns     HPI:   Today, 06/08/2020, Mr. Voorhies returns for 68-month stroke follow-up accompanied by his neighbor [name].  Stable since prior visit without new stroke/TIA symptoms.  Reports residual cognitive impairment with continued improvement.  Denies ongoing imbalance or right hand weakness.  Continues to live independently but does have a neighbor checking on him routinely.  Able to maintain ADLs and majority of IADLs independently including driving without difficulty.  Remains on Eliquis 5 mg twice daily for A. fib and secondary stroke prevention without bleeding or bruising.  Remains on atorvastatin 40 mg daily with recent LDL 65.  Blood pressure today 138/88.  No concerns at this time.   History provided for reference purposes only Update 12/21/2019 JM: Mr. Abdullah returns for follow-up regarding right MCA and PCA infarcts secondary to A. fib not on AC in 08/2019.  He is accompanied by his neighbor.  He has been stable since prior visit with short term memory impairment and delayed recall, mild imbalance and mild difficulty with writing all present since his stroke but does endorse gradual improvement.  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  Continues to live independently with his neighbor checking on him throughout the day.  Continues on Eliquis for secondary stroke prevention and history of atrial fibrillation without bleeding or bruising.  Continues on atorvastatin 40 mg daily without myalgias.  He has not had repeat lipid panel since hospitalization.  Blood pressure today 144/66.  He continues on amlodipine 10mg  daily. He has since established care with cardiology for monitoring management of A. fib and hx of CAD.  No concerns at this time.  Initial visit 09/21/2019 JM: Mr. Vanecek is a 86 year old male who is being seen today for hospital follow-up accompanied by family friend. He has recovered well from a stroke standpoint with mild cognitive impairment but this has greatly been improving.  He continues to live alone but his neighbor will check on him throughout the day and assist as needed.  Continues on Eliquis and atorvastatin 40 mg daily for secondary stroke prevention without side effects.  He does not have a current cardiologist as he is currently followed by [facility] but he is requesting to establish care locally.  Blood pressure today initially 108/57 and on recheck 128/58 with heart rate 60.  Continues on amlodipine 10 mg daily.  He does not routinely monitor at home.  No concerns at this time.  Stroke admission 08/15/2019: Mr.Ewald E Hopkinsis a 86 y.o.malewith history of AF not on AC, CAD, HTNwho presented on 08/15/2019 with dysarthria, L facial droop, L hemiparesis and R gaze preference. Evaluated by stroke team and  Dr. Erlinda Hong with stroke work-up revealing multiple right MCA PCA infarcts likely embolic secondary to AF not on [facility].  Received tPA 08/15/2019 at 5916 without complication.  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  2D echo EF of 45 to 50% without cardiac source of embolus identified.  Previously on  aspirin and recommend transitioning to Eliquis with history of AF and for secondary stroke prevention as no obvious contraindication for [facility].  Positive Covid testing upon arrival to ED asymptomatic.  History of HTN stable during admission resuming amlodipine and metoprolol.  LDL 101 increased atorvastatin to 40 mg daily.  No prior history of DM with A1c 6.3.   Other stroke risk factors include advanced age, former tobacco use, overweight, CAD but no prior history of stroke.  Other active problems include CKD stage III.  Residual deficits of mild dysarthria, left facial weakness and dysphagia and discharged home with recommendation of home health therapies.  Stroke:multiple R MCA and PCA infarctslikelyembolic secondary toAF not on AC  CT head No acute abnormality. ASPECTS 10.   CTA head & neckR M3 occlusion. B ICA bifurcation atherosclerosis w/ R ICA 15% stenosis. ICA siphon atherosclerosis but w/o >15% stenosis. 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 BA >80% stenosis. Aortic atherosclerosis.   CT perfusiondiminished CBV, CBF and MTT in frontal operculum  MRImultifocal R MCA and PCA infarcts. Small vessel disease.   MRAno ELVO. Moderate narrowing BA exaggerated by artifact.   2D EchoEF 45-50%. No source of embolus   LDL101  HgbA1c6.3   ROS:   14 system review of systems performed and negative with exception of those listed in HPI  PMH:  Past Medical History:  Diagnosis Date  . A-fib ([place])   . Coronary artery disease   . Hypertension   . Prostate cancer ([place]) 2010    PSH:  Past Surgical History:  Procedure Laterality Date  . COLONOSCOPY  2004  . INSERTION PROSTATE RADIATION SEED  2010    Social History:  Social History   Socioeconomic History  . Marital status: Divorced    Spouse name: Not on file  . Number of children: Not on file  . Years of education: Not on file  . Highest education level: Not on file  Occupational History  . Not on file  Tobacco Use  . Smoking status: Former Research scientist (life sciences)  . Smokeless tobacco: Never Used  Substance and Sexual Activity  . Alcohol use: Not Currently  . Drug use: Not Currently  . Sexual activity: Not on file  Other Topics Concern  . Not on file  Social History Narrative  . Not on file   Social Determinants of Health   Financial Resource Strain: Not on file  Food  Insecurity: Not on file  Transportation Needs: Not on file  Physical Activity: Not on file  Stress: Not on file  Social Connections: Not on file  Intimate Partner Violence: Not on file    Family History: No family history on file.  Medications:   Current Outpatient Medications on File Prior to Visit  Medication Sig Dispense Refill  . amLODipine (NORVASC) 10 MG tablet Take 10 mg by mouth daily.    Marland Kitchen apixaban (ELIQUIS) 2.5 MG TABS tablet Take 1 tablet (2.5 mg total) by mouth 2 (two) times daily. 180 tablet 1  . atorvastatin (LIPITOR) 40 MG tablet Take 1 tablet (40 mg total) by mouth at bedtime. 30 tablet 2  . Cholecalciferol (VITAMIN D3) 1.25 MG (50000 UT) TABS Take by mouth.     No current facility-administered medications on file prior to visit.    Allergies:  No Known Allergies   Physical Exam  Vitals:   06/08/20 0825  BP: 138/88  Pulse: 88  Weight: 215 lb (97.5 kg)  Height: 5\' 11"  (1.803 m)   Body mass  index is 29.99 kg/m. No exam data present  General: well developed, well nourished, pleasant elderly African-American male, seated, in no evident distress Head: head normocephalic and atraumatic.   Neck: supple with no carotid or supraclavicular bruits Cardiovascular: irregular rate and rhythm, no murmurs Musculoskeletal: no deformity Skin:  no rash/petichiae Vascular:  Normal pulses all extremities   Neurologic Exam Mental Status: Awake and fully alert. Oriented to place and time. Recent memory impaired and remote memory intact. Attention span, concentration and fund of knowledge appropriate during visit. Mood and affect appropriate. Recall: 2/3 (prior 1/3) - 3/3 with prompting. 4 legged animal naming 11 (prior 10) in 60 seconds. Serial addition good Cranial Nerves: Pupils equal, briskly reactive to light. Extraocular movements full without nystagmus. Visual fields full to confrontation.  HOH bilaterally. Facial sensation intact. Face, tongue, palate moves normally and  symmetrically.  Motor: Normal bulk and tone. Normal strength in all tested extremity muscles Sensory.: intact to touch , pinprick , position and vibratory sensation.  Coordination: Rapid alternating movements normal in all extremities. Finger-to-nose and heel-to-shin performed accurately bilaterally. Gait and Station: Arises from chair without difficulty. Stance is normal. Gait demonstrates normal stride length and balance without assistive device.  Able to tandem walk and heel toe without difficulty.  Romberg negative. Reflexes: 1+ and symmetric. Toes downgoing.       ASSESSMENT: Dillon Smith is a 86 y.o. year old male presented with dysarthria, left facial droop, left hemiparesis and right gaze preference on 08/15/2019 with stroke work-up revealing multiple right MCA PCA infarcts likely embolic secondary to known AF not on [facility] therefore Eliquis initiated for secondary stroke prevention. Vascular risk factors include HTN, HLD, CAD s/p CABG and AF previously not on AC.      PLAN:  1. Right MCA/PCA stroke:  a. Residual deficits: Cognitive impairment -continued improvement.  Encouraged memory exercises, routine exercise, healthy diet, adequate sleep and management of stroke risk factors to prevent worsening.  b. Continue Eliquis (apixaban) daily  and atorvastatin for secondary stroke prevention.  c. Discussed secondary stroke prevention measures and importance of close PCP and cardiology follow-up for aggressive stroke risk factor management 2. Persistent atrial fibrillation: On Eliquis per cardiology 3. HTN: BP goal<130/90.  Stable on amlodipine 10 mg daily per PCP/cardiology 4. HLD: LDL goal<70.  Recent LDL 65 (11/2019).  On atorvastatin 40 mg daily.  Request ongoing monitoring and prescribing per PCP    Follow up in 1 year or call earlier if needed   CC: [name] PCP Antony Contras, MD [place] provider  I spent 30 minutes of face-to-face and non-face-to-face time with  patient and his neighbor, [name].  This included previsit chart review, lab review, study review, order entry, electronic health record documentation, patient education regarding prior stroke, residual deficits with improvement, importance of managing stroke risk factors and answered all questions to patient and neighbor satisfaction   Frann Rider, AGNP-BC  [facility] [address] [place], [place] [address]  Phone [phone] Fax [phone] Note: This document was prepared with digital dictation and possible smart phrase technology. Any transcriptional errors that result from this process are unintentional.

## 2020-06-08 NOTE — Progress Notes (Signed)
I agree with the above plan 

## 2020-07-03 ENCOUNTER — Encounter: Payer: Self-pay | Admitting: Physician Assistant

## 2020-07-03 NOTE — Progress Notes (Signed)
Cardiology Office Note    Date:  07/06/2020   ID:  Dillon Smith, DOB 1929/10/18, MRN [id number]  PCP:  [facility]  Cardiologist:  Verne Carrow, MD  Electrophysiologist:  Hillis Range, MD   Chief Complaint: f/u afib, CAD  History of Present Illness:   Dillon Smith is a 87 y.o. male with history of CAD s/p CABG 2007 at [facility], HTN, stroke 08/2019, persistent atrial fib, PVCs, bradycardia, prostate CA, HLD, CKD stage III, mild LV dysfunction who is seen back for routine follow-up. He was originally seen by our team in 08/2019 (Dr. Clifton James and Allred) when admitted for stroke, recently Covid positive. He was found to have cerebrovascular disease as well as atrial fib therefore started on anticoagulation. He was also incidentally noted to have bradycardia and frequent PVCs. Beta blocker was stopped and this was managed conservatively. 2D echo showed EF 45-50%, global HK, mildly reduced RV function, severe LAE, mild-moderate MR. He saw Dr. Eldridge Dace back in follow-up but is a patient of Dr. Gibson Ramp due to initial consultation. Dr. Eldridge Dace recommended continuation of rate control strategy at that time, no plans for pursuit of sinus rhythm noted.  He is seen back for follow-up today doing well. He is accompanied by his neighbor and caregiver. He denies any palpitations, chest pain, dyspnea, edema, orthopnea or bleeding. No falls. He is pleased with how he is feeling. He has a BP cuff at home but does not routinely use it. 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 Hoyle Barr prior advice.  Labwork independently reviewed: 11/2019 LDL 65 08/2019 Na 139, K 4.5, Cr 1.70, albumin 2.4, AST 41, ALT 46, Hgb 14.1, TSH wnl   Past Medical History:  Diagnosis Date  . Bradycardia   . CKD (chronic kidney disease), stage III (HCC)   . Coronary artery disease   . COVID-19 08/2019  . Hyperlipidemia LDL goal <70   .  Hypertension   . Persistent atrial fibrillation (HCC)   . Prostate cancer (HCC) 2010  . PVC's (premature ventricular contractions)   . Stroke (cerebrum) [facility])     Past Surgical History:  Procedure Laterality Date  . COLONOSCOPY  2004  . INSERTION PROSTATE RADIATION SEED  2010    Current Medications: Current Meds  Medication Sig  . amLODipine (NORVASC) 10 MG tablet Take 10 mg by mouth daily.  Marland Kitchen apixaban (ELIQUIS) 2.5 MG TABS tablet Take 1 tablet (2.5 mg total) by mouth 2 (two) times daily.  Marland Kitchen atorvastatin (LIPITOR) 40 MG tablet Take 1 tablet (40 mg total) by mouth at bedtime.  . Cholecalciferol (VITAMIN D3) 1.25 MG (50000 UT) TABS Take by mouth every other day.  . Multiple Vitamin (MULTI VITAMIN DAILY PO) Take by mouth daily.     Allergies:   Patient has no known allergies.   Social History   Socioeconomic History  . Marital status: Divorced    Spouse name: Not on file  . Number of children: Not on file  . Years of education: Not on file  . Highest education level: Not on file  Occupational History  . Not on file  Tobacco Use  . Smoking status: Former Games developer  . Smokeless tobacco: Never Used  Substance and Sexual Activity  . Alcohol use: Not Currently  . Drug use: Not Currently  . Sexual activity: Not on file  Other Topics Concern  . Not on file  Social  History Narrative  . Not on file   Social Determinants of Health   Financial Resource Strain: Not on file  Food Insecurity: Not on file  Transportation Needs: Not on file  Physical Activity: Not on file  Stress: Not on file  Social Connections: Not on file     Family History:  The patient's Family history is unknown by patient.  ROS:   Please see the history of present illness.  All other systems are reviewed and otherwise negative.    EKGs/Labs/Other Studies Reviewed:    Studies reviewed are outlined and summarized above. Reports included below if pertinent.   1. Left ventricular ejection fraction,  by estimation, is 45 to 50%. The  left ventricle has mildly decreased function. The left ventricle  demonstrates global hypokinesis. The left ventricular internal cavity size  was mildly dilated. Left ventricular  diastolic function could not be evaluated.  2. Right ventricular systolic function is mildly reduced. The right  ventricular size is normal. There is normal pulmonary artery systolic  pressure. The estimated right ventricular systolic pressure is 123456 mmHg.  3. Left atrial size was severely dilated.  4. The mitral valve is degenerative. Mild to moderate mitral valve  regurgitation.  5. The aortic valve is tricuspid. Aortic valve regurgitation is moderate.  No aortic stenosis is present.  6. The inferior vena cava is normal in size with greater than 50%  respiratory variability, suggesting right atrial pressure of 3 mmHg.   Comparison(s): No prior Echocardiogram.     EKG:  EKG is ordered today, personally reviewed, demonstrating atrial fib 58bpm, one PVC, possible U wave V2, nonspecific TW changes  Recent Labs: 08/19/2019: Magnesium 2.3; TSH 2.736 08/21/2019: ALT 46; BUN 17; Creatinine, Ser 1.70; Hemoglobin 14.1; Platelets 360; Potassium 4.5; Sodium 139  Recent Lipid Panel    Component Value Date/Time   CHOL 128 12/21/2019 0815   TRIG 46 12/21/2019 0815   HDL 52 12/21/2019 0815   CHOLHDL 2.5 12/21/2019 0815   CHOLHDL 6.3 08/16/2019 0242   VLDL 22 08/16/2019 0242   LDLCALC 65 12/21/2019 0815    PHYSICAL EXAM:    VS:  BP 140/60   Pulse (!) 58   Ht 5\' 11"  (1.803 m)   Wt 218 lb (98.9 kg)   SpO2 99%   BMI 30.40 kg/m   BMI: Body mass index is 30.4 kg/m.  GEN: Well nourished, well developed elderly AAM, in no acute distress HEENT: normocephalic, atraumatic Neck: no JVD, carotid bruits, + soft tissue mass midline neck (very mobile/soft - pt reports previously told this was a fatty tumor a long time ago and benign) Cardiac: irregular irregular, rate controlled; no  murmurs, rubs, or gallops, no edema  Respiratory:  clear to auscultation bilaterally, normal work of breathing GI: soft, nontender, nondistended, + BS MS: no deformity or atrophy Skin: warm and dry, no rash Neuro:  Alert and Oriented x 3, Strength and sensation are intact, follows commands, hard of hearing Psych: euthymic mood, full affect  Wt Readings from Last 3 Encounters:  07/06/20 218 lb (98.9 kg)  06/08/20 215 lb (97.5 kg)  12/21/19 215 lb (97.5 kg)     ASSESSMENT & PLAN:   1. CAD s/p CABG - doing well without any recent angina. He is not on aspirin due to concomitant Eliquis. Not on BB due to bradycardia. Continue atorvastatin. LDL at goal by last check in 11/2019.   2. Persistent atrial fib - rate control strategy employed; not requiring any AVN blocking  agents. Will check surveillance CMET/CBC today (CMET chosen due to abnormal LFTs in 2021). Bleeding precautions reviewed. Eliquis dose remains appropriate. Denies any bleeding/falls.  3. LV dysfunction - asymptomatic, mild on echo in 2021. He is not describing any new cardiac symptoms and appears euvolemic. In the absence of symptoms and presence of extremely high community spread of Covid at this time, do not feel he requires updated echocardiogram at this time just for surveillance. He will notify us for any new cardiac symptoms. Not on ACEi/ARB/spiro due to CKD.  4. History of PVCs and bradycardia - asymptomatic. Continue clinical surveillance for now. Check lytes, Mg today.  5. Abnormal LFTs - repeat CMET today given statin therapy. I suspect this was likely due to Covid at the time.  6. Essential HTN - Suboptimal blood pressure control noted today. Repeat BP by me 159/82. We need a better idea of what it's running at home. The patient was provided instructions on monitoring blood pressure at home for 5 days and relaying results to our office. BP med options are somewhat limited given CKD and bradycardia. Although amlodipine is  less ideal with LV dysfunction, he is doing well on this regimen so we will continue. Hydralazine would be an option in the future if he should require it based on his f/u readings to [name].  Disposition: F/u with Dr. Angelena Form in 6 months.  Medication Adjustments/Labs and Tests Ordered: Current medicines are reviewed at length with the patient today.  Concerns regarding medicines are outlined above. Medication changes, Labs and Tests ordered today are summarized above and listed in the Patient Instructions accessible in Encounters.   Signed, Charlie Pitter, PA-C  07/06/2020 11:34 AM    [address], [place], [place]  [address] Phone: [phone]; Fax: [phone]

## 2020-07-06 ENCOUNTER — Other Ambulatory Visit: Payer: Self-pay

## 2020-07-06 ENCOUNTER — Encounter (INDEPENDENT_AMBULATORY_CARE_PROVIDER_SITE_OTHER): Payer: Self-pay

## 2020-07-06 ENCOUNTER — Ambulatory Visit (INDEPENDENT_AMBULATORY_CARE_PROVIDER_SITE_OTHER): Payer: Medicare Other | Admitting: Physician Assistant

## 2020-07-06 ENCOUNTER — Encounter: Payer: Self-pay | Admitting: Physician Assistant

## 2020-07-06 VITALS — BP 140/60 | HR 58 | Ht 71.0 in | Wt 218.0 lb

## 2020-07-06 DIAGNOSIS — I10 Essential (primary) hypertension: Secondary | ICD-10-CM

## 2020-07-06 DIAGNOSIS — R7989 Other specified abnormal findings of blood chemistry: Secondary | ICD-10-CM

## 2020-07-06 DIAGNOSIS — I4819 Other persistent atrial fibrillation: Secondary | ICD-10-CM

## 2020-07-06 DIAGNOSIS — I519 Heart disease, unspecified: Secondary | ICD-10-CM | POA: Diagnosis not present

## 2020-07-06 DIAGNOSIS — I2510 Atherosclerotic heart disease of native coronary artery without angina pectoris: Secondary | ICD-10-CM | POA: Diagnosis not present

## 2020-07-06 DIAGNOSIS — R001 Bradycardia, unspecified: Secondary | ICD-10-CM

## 2020-07-06 DIAGNOSIS — I493 Ventricular premature depolarization: Secondary | ICD-10-CM | POA: Diagnosis not present

## 2020-07-06 DIAGNOSIS — I480 Paroxysmal atrial fibrillation: Secondary | ICD-10-CM

## 2020-07-06 DIAGNOSIS — R945 Abnormal results of liver function studies: Secondary | ICD-10-CM

## 2020-07-06 LAB — COMPREHENSIVE METABOLIC PANEL
ALT: 31 IU/L (ref 0–44)
AST: 27 IU/L (ref 0–40)
Albumin/Globulin Ratio: 1.5 (ref 1.2–2.2)
Albumin: 4.3 g/dL (ref 3.6–4.6)
Alkaline Phosphatase: 97 IU/L (ref 44–121)
BUN/Creatinine Ratio: 13 (ref 10–24)
BUN: 22 mg/dL (ref 8–27)
Bilirubin Total: 0.5 mg/dL (ref 0.0–1.2)
CO2: 23 mmol/L (ref 20–29)
Calcium: 9.6 mg/dL (ref 8.6–10.2)
Chloride: 105 mmol/L (ref 96–106)
Creatinine, Ser: 1.64 mg/dL — ABNORMAL HIGH (ref 0.76–1.27)
GFR calc Af Amer: 43 mL/min/{1.73_m2} — ABNORMAL LOW (ref >59)
GFR calc non Af Amer: 37 mL/min/{1.73_m2} — ABNORMAL LOW (ref >59)
Globulin, Total: 2.9 g/dL (ref 1.5–4.5)
Glucose: 93 mg/dL (ref 65–99)
Potassium: 5.1 mmol/L (ref 3.5–5.2)
Sodium: 141 mmol/L (ref 134–144)
Total Protein: 7.2 g/dL (ref 6.0–8.5)

## 2020-07-06 LAB — CBC
Hematocrit: 42.4 % (ref 37.5–51.0)
Hemoglobin: 14.3 g/dL (ref 13.0–17.7)
MCH: 32 pg (ref 26.6–33.0)
MCHC: 33.7 g/dL (ref 31.5–35.7)
MCV: 95 fL (ref 79–97)
Platelets: 209 10*3/uL (ref 150–450)
RBC: 4.47 x10E6/uL (ref 4.14–5.80)
RDW: 13 % (ref 11.6–15.4)
WBC: 4.4 10*3/uL (ref 3.4–10.8)

## 2020-07-06 LAB — MAGNESIUM: Magnesium: 2 mg/dL (ref 1.6–2.3)

## 2020-07-06 NOTE — Patient Instructions (Addendum)
Medication Instructions:  Your physician recommends that you continue on your current medications as directed. Please refer to the Current Medication list given to you today.  *If you need a refill on your cardiac medications before your next appointment, please call your pharmacy*   Lab Work: TODAY:  CMET, CBC, & MAG  If you have labs (blood work) drawn today and your tests are completely normal, you will receive your results only by: Marland Kitchen MyChart Message (if you have MyChart) OR . A paper copy in the mail If you have any lab test that is abnormal or we need to change your treatment, we will call you to review the results.   Testing/Procedures: None ordered   Follow-Up: At [facility], you and your health needs are our priority.  As part of our continuing mission to provide you with exceptional heart care, we have created designated Provider Care Teams.  These Care Teams include your primary Cardiologist (physician) and Advanced Practice Providers (APPs -  Physician Assistants and Nurse Practitioners) who all work together to provide you with the care you need, when you need it.  We recommend signing up for the patient portal called "MyChart".  Sign up information is provided on this After Visit Summary.  MyChart is used to connect with patients for Virtual Visits (Telemedicine).  Patients are able to view lab/test results, encounter notes, upcoming appointments, etc.  Non-urgent messages can be sent to your provider as well.   To learn more about what you can do with MyChart, go to [email or web address].    Your next appointment:   6 month(s)  The format for your next appointment:   In Person  Provider:   You may see Verne Carrow, MD or one of the following Advanced Practice Providers on your designated Care Team:    Ronie Spies, PA-C  Jacolyn Reedy, PA-C    Other Instructions  We need to get a better idea of how your blood pressure is running at home. I would  recommend using a blood pressure cuff that goes on your arm. The wrist ones can be inaccurate. If possible, try to select one that also reports your heart rate. To check your blood pressure, choose a time at least 3 hours after taking your blood pressure medicines. If you can sample it at different times of the day, that's great - it might give you more information about how your blood pressure fluctuates. Remain seated in a chair for 5 minutes quietly beforehand, then check it. Please record a list of those readings and call us [phone] or send in MyChart message with them for our review in 5 days.  If you notice any bleeding such as blood in stool, black tarry stools, blood in urine, nosebleeds or any other unusual bleeding, call your doctor immediately. It is not normal to have this kind of bleeding while on a blood thinner and usually indicates there is an underlying problem with one of your body systems that needs to be checked out.

## 2020-07-10 ENCOUNTER — Telehealth: Payer: Self-pay | Admitting: *Deleted

## 2020-07-10 NOTE — Telephone Encounter (Signed)
Noted  

## 2020-07-10 NOTE — Telephone Encounter (Signed)
Spoke with pt neighbor, she was calling to let us know his vit D is 1000 iu or 25 mcg every other day and he has just a multivit with no other information. Will make dayna dunn aware

## 2020-08-28 ENCOUNTER — Telehealth: Payer: Self-pay | Admitting: Interventional Cardiology

## 2020-08-28 MED ORDER — HYDRALAZINE HCL 25 MG PO TABS: 25.0000 mg | ORAL_TABLET | Freq: Three times a day (TID) | ORAL | 3 refills | Status: DC

## 2020-08-28 NOTE — Telephone Encounter (Signed)
Pt c/o BP issue: STAT if pt c/o blurred vision, one-sided weakness or slurred speech  1. What are your last 5 BP readings?   02/21: 159/60 02/22: 150/55 02/23: 144/69 02/24: 140/70 02/25: 156/51 02/26: 160/74 02/27: 152/84   2. Are you having any other symptoms (ex. Dizziness, headache, blurred vision, passed out)? No   3. What is your BP issue? Dillon Smith, patient's caregiver, is calling to provide the patient's BP readings for the past week.

## 2020-08-28 NOTE — Telephone Encounter (Signed)
THis is a Dr. Angelena Form patient who I saw likely as DOD one time.

## 2020-08-28 NOTE — Telephone Encounter (Signed)
I spoke with [name], pt's caregiver and adv of recommendations.  I also called the patient's home number and left this information on his VM.  They will pick up the signed printed prescription at the office tomorrow to take to the [facility] to be filled.  Discussed times to take this 8am, 2pm, 8pm daily and continue to monitor blood pressures.

## 2020-08-28 NOTE — Telephone Encounter (Signed)
His BP reading are slightly above normal. He has not tolerated rate lowering therapy. I would not want to start an ARB or Ace-inh given his chronic kidney disease. We can start hydralazine 25 mg po TID. Gerald Stabs

## 2020-08-29 NOTE — Telephone Encounter (Signed)
Called patient back and informed him to take his BP a couple of hours after taking his morning medications. Patient verbalized understanding.

## 2020-08-29 NOTE — Telephone Encounter (Signed)
Patient would like to know if he needs to take his BP readings before or after breakfast. She states she is leaving at 12:15 pm today.

## 2020-09-07 NOTE — Telephone Encounter (Signed)
Pt c/o BP issue: STAT if pt c/o blurred vision, one-sided weakness or slurred speech  1. What are your last 5 BP readings?   03/02 143/68 03/03 128/54 03/04 148/64 03/05 132/68 03/06 140/76 03/07 136/65 03/08 133/76   2. Are you having any other symptoms (ex. Dizziness, headache, blurred vision, passed out)? Not that she knows of, but states she will ask Harl Bowie   3. What is your BP issue? Was advised to report BP readings.

## 2020-09-07 NOTE — Telephone Encounter (Signed)
Spoke with Gardner Candle, pt's caregiver. The patient's BP readings sent today are improved from 2 weeks ago.  He did not start the hydralazine.  Have not picked up rx from our office yet. (gets filled through [place]).  Readings look better.  She had him start taking BPs after breakfast.  He will continue checking BP, I adv 3-4 hrs after amlodipine in the mornings.

## 2021-02-04 ENCOUNTER — Encounter: Payer: Self-pay | Admitting: Physician Assistant

## 2021-02-04 NOTE — Progress Notes (Signed)
Cardiology Office Note    Date:  02/06/2021   ID:  Dillon Smith, DOB 11/01/1929, MRN [id number]  PCP:  [name]  Cardiologist:  Lauree Chandler, MD  Electrophysiologist:  Thompson Grayer, MD   Chief Complaint: f/u afib, CAD  History of Present Illness:   Dillon Smith is a 87 y.o. male with history of CAD s/p CABG 2007 at [facility], HTN, stroke 08/2019, persistent atrial fib, PVCs, bradycardia, prostate CA, HLD, CKD stage IIIb, mild LV dysfunction, fatty tissue fullness in neck who is seen back for routine follow-up. He was originally seen by our team in 08/2019 (Dr. Angelena Form and Allred) when admitted for stroke, having been recently Covid positive. He was found to have cerebrovascular disease as well as atrial fib therefore started on anticoagulation. He was also incidentally noted to have bradycardia and frequent PVCs. Beta blocker was stopped and this was managed conservatively. 2D echo showed EF 45-50%, global HK, mildly reduced RV function, severe LAE, mild-moderate MR. He saw Dr. Irish Lack back in follow-up but is a patient of Dr. Camillia Herter due to initial consultation. Rate control strategy has been employed.   He is seen back for follow-up and is doing well without any complaints. No CP, SOB, palpitations, orthopnea, dizziness, syncope. 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 Clayton Bibles previously advised he not go on ladders or work under homes).  Labwork independently reviewed: 07/2020 CBC wnl, Mg 2.0, K 5.1, Cr 1.64, LFTS ok 11/2019 lipids good - LDL 65   Past Medical History:  Diagnosis Date   Bradycardia    CKD (chronic kidney disease), stage III ([place])    Coronary artery disease    CABG 2007   COVID-19 08/2019   Hyperlipidemia LDL goal <70    Hypertension    Persistent atrial fibrillation ([place])    Prostate cancer ([place]) 2010   PVC's (premature ventricular contractions)    Stroke (cerebrum) ([place])     Past Surgical  History:  Procedure Laterality Date   COLONOSCOPY  2004   INSERTION PROSTATE RADIATION SEED  2010    Current Medications: Current Meds  Medication Sig   amLODipine (NORVASC) 10 MG tablet Take 10 mg by mouth daily.   apixaban (ELIQUIS) 2.5 MG TABS tablet Take 1 tablet (2.5 mg total) by mouth 2 (two) times daily.   atorvastatin (LIPITOR) 40 MG tablet Take 1 tablet (40 mg total) by mouth at bedtime.   Cholecalciferol (VITAMIN D3) 1.25 MG (50000 UT) TABS Take by mouth every other day.   hydrALAZINE (APRESOLINE) 25 MG tablet Take 1 tablet (25 mg total) by mouth 3 (three) times daily.   Multiple Vitamin (MULTI VITAMIN DAILY PO) Take by mouth daily.      Allergies:   Patient has no known allergies.   Social History   Socioeconomic History   Marital status: Divorced    Spouse name: Not on file   Number of children: Not on file   Years of education: Not on file   Highest education level: Not on file  Occupational History   Not on file  Tobacco Use   Smoking status: Former   Smokeless tobacco: Never  Substance and Sexual Activity   Alcohol use: Not Currently   Drug use: Not Currently   Sexual activity: Not on file  Other Topics Concern   Not on file  Social History Narrative   Not on file   Social Determinants of Health   Financial  Resource Strain: Not on file  Food Insecurity: Not on file  Transportation Needs: Not on file  Physical Activity: Not on file  Stress: Not on file  Social Connections: Not on file     Family History:  The patient's Family history is unknown by patient.  ROS:   Please see the history of present illness.  All other systems are reviewed and otherwise negative.    EKGs/Labs/Other Studies Reviewed:    Studies reviewed are outlined and summarized above. Reports included below if pertinent.  08/2019  1. Left ventricular ejection fraction, by estimation, is 45 to 50%. The  left ventricle has mildly decreased function. The left ventricle   demonstrates global hypokinesis. The left ventricular internal cavity size  was mildly dilated. Left ventricular  diastolic function could not be evaluated.   2. Right ventricular systolic function is mildly reduced. The right  ventricular size is normal. There is normal pulmonary artery systolic  pressure. The estimated right ventricular systolic pressure is 123456 mmHg.   3. Left atrial size was severely dilated.   4. The mitral valve is degenerative. Mild to moderate mitral valve  regurgitation.   5. The aortic valve is tricuspid. Aortic valve regurgitation is moderate.  No aortic stenosis is present.   6. The inferior vena cava is normal in size with greater than 50%  respiratory variability, suggesting right atrial pressure of 3 mmHg.   Comparison(s): No prior Echocardiogram.    EKG:  EKG is ordered today, personally reviewed, demonstrating atrial fib 66bpm, occasional PVCs, nonspecific TW changes similar to prior  Recent Labs: 07/06/2020: ALT 31; BUN 22; Creatinine, Ser 1.64; Hemoglobin 14.3; Magnesium 2.0; Platelets 209; Potassium 5.1; Sodium 141  Recent Lipid Panel    Component Value Date/Time   CHOL 128 12/21/2019 0815   TRIG 46 12/21/2019 0815   HDL 52 12/21/2019 0815   CHOLHDL 2.5 12/21/2019 0815   CHOLHDL 6.3 08/16/2019 0242   VLDL 22 08/16/2019 0242   LDLCALC 65 12/21/2019 0815    PHYSICAL EXAM:    VS:  BP 132/70   Pulse 66   Ht '5\' 11"'$  (1.803 m)   Wt 205 lb (93 kg)   SpO2 98%   BMI 28.59 kg/m   BMI: Body mass index is 28.59 kg/m.  GEN: Well nourished, well developed male in no acute distress HEENT: normocephalic, atraumatic, + arcus senilis Neck: no JVD, carotid bruits. Soft tissue mass in front of neck - pt states previously evaluated as fatty tissue Cardiac: irregularly irregular, rate controlled, no murmurs, rubs, or gallops, no edema  Respiratory:  clear to auscultation bilaterally, normal work of breathing GI: soft, nontender, nondistended, + BS MS:  no deformity or atrophy Skin: warm and dry, no rash Neuro:  Alert and Oriented x 3, Strength and sensation are intact, follows commands, hard of hearing Psych: euthymic mood, full affect  Wt Readings from Last 3 Encounters:  02/06/21 205 lb (93 kg)  07/06/20 218 lb (98.9 kg)  06/08/20 215 lb (97.5 kg)     ASSESSMENT & PLAN:   1. CAD s/p CABG - doing well without any angina. Not on ASA due to concomitant Eliquis and risk of bleeding. Not on BB due to baseline bradycardia. Continue atorvastatin. Check CMET/lipids today.  2. Persistent atrial fib, also hx of PVCs/bradycardia - asymptomatic. Rate controlled, not on any AVN blocking agents. Continue Eliquis, dose appropriate for age and Cr >1.5. Recheck CBC and kidney function today. Check Mg and TSH given PVCs.  3.  Essential HTN - BP acceptable given age and comorbidities. Although amlodipine is less ideal with LV dysfunction, he has done great on the current regimen so would not make any changes today. He is on hydralazine for his cardiomyopathy.  4. LV dysfunction - appears euvolemic. Has not been on ACEI/ARB/spiro due to renal insufficiency (also note borderline K in the past). Not on BB due to bradycardia. Continue hydralazine.   5. CKD stage IIIb - discussed finding with pt and companion Dillon Smith. He may benefit from establishing with a nephrologist for baseline care. They plan to discuss with his PCP at his next [place] follow-up soon. Dillon Smith inquired about multivitamin - advised to avoid those with supplemental potassium in them.  Disposition: F/u with Dr. Angelena Form or myself in 6 months.   Medication Adjustments/Labs and Tests Ordered: Current medicines are reviewed at length with the patient today.  Concerns regarding medicines are outlined above. Medication changes, Labs and Tests ordered today are summarized above and listed in the Patient Instructions accessible in Encounters.   Signed, Charlie Pitter, PA-C  02/06/2021 8:34 AM    [address], [place], [place]  [address] Phone: [phone]; Fax: [phone]

## 2021-02-06 ENCOUNTER — Ambulatory Visit (INDEPENDENT_AMBULATORY_CARE_PROVIDER_SITE_OTHER): Payer: Medicare Other | Admitting: Physician Assistant

## 2021-02-06 ENCOUNTER — Encounter: Payer: Self-pay | Admitting: Physician Assistant

## 2021-02-06 ENCOUNTER — Other Ambulatory Visit: Payer: Self-pay

## 2021-02-06 VITALS — BP 132/70 | HR 66 | Ht 71.0 in | Wt 205.0 lb

## 2021-02-06 DIAGNOSIS — I493 Ventricular premature depolarization: Secondary | ICD-10-CM

## 2021-02-06 DIAGNOSIS — I2510 Atherosclerotic heart disease of native coronary artery without angina pectoris: Secondary | ICD-10-CM

## 2021-02-06 DIAGNOSIS — I519 Heart disease, unspecified: Secondary | ICD-10-CM

## 2021-02-06 DIAGNOSIS — I4819 Other persistent atrial fibrillation: Secondary | ICD-10-CM

## 2021-02-06 DIAGNOSIS — R001 Bradycardia, unspecified: Secondary | ICD-10-CM

## 2021-02-06 DIAGNOSIS — N1832 Chronic kidney disease, stage 3b: Secondary | ICD-10-CM

## 2021-02-06 DIAGNOSIS — I10 Essential (primary) hypertension: Secondary | ICD-10-CM

## 2021-02-06 LAB — COMPREHENSIVE METABOLIC PANEL
ALT: 30 IU/L (ref 0–44)
AST: 27 IU/L (ref 0–40)
Albumin/Globulin Ratio: 1.6 (ref 1.2–2.2)
Albumin: 3.9 g/dL (ref 3.6–4.6)
Alkaline Phosphatase: 84 IU/L (ref 44–121)
BUN/Creatinine Ratio: 13 (ref 10–24)
BUN: 23 mg/dL (ref 8–27)
Bilirubin Total: 0.5 mg/dL (ref 0.0–1.2)
CO2: 22 mmol/L (ref 20–29)
Calcium: 9.7 mg/dL (ref 8.6–10.2)
Chloride: 106 mmol/L (ref 96–106)
Creatinine, Ser: 1.77 mg/dL — ABNORMAL HIGH (ref 0.76–1.27)
Globulin, Total: 2.5 g/dL (ref 1.5–4.5)
Glucose: 94 mg/dL (ref 65–99)
Potassium: 4.6 mmol/L (ref 3.5–5.2)
Sodium: 143 mmol/L (ref 134–144)
Total Protein: 6.4 g/dL (ref 6.0–8.5)
eGFR: 36 mL/min/{1.73_m2} — ABNORMAL LOW (ref >59)

## 2021-02-06 LAB — LIPID PANEL
Chol/HDL Ratio: 2.3 ratio (ref 0.0–5.0)
Cholesterol, Total: 125 mg/dL (ref 100–199)
HDL: 55 mg/dL (ref >39)
LDL Chol Calc (NIH): 60 mg/dL (ref 0–99)
Triglycerides: 41 mg/dL (ref 0–149)
VLDL Cholesterol Cal: 10 mg/dL (ref 5–40)

## 2021-02-06 LAB — CBC
Hematocrit: 40.1 % (ref 37.5–51.0)
Hemoglobin: 13.1 g/dL (ref 13.0–17.7)
MCH: 31.5 pg (ref 26.6–33.0)
MCHC: 32.7 g/dL (ref 31.5–35.7)
MCV: 96 fL (ref 79–97)
Platelets: 184 10*3/uL (ref 150–450)
RBC: 4.16 x10E6/uL (ref 4.14–5.80)
RDW: 13.7 % (ref 11.6–15.4)
WBC: 5.7 10*3/uL (ref 3.4–10.8)

## 2021-02-06 LAB — MAGNESIUM: Magnesium: 1.9 mg/dL (ref 1.6–2.3)

## 2021-02-06 LAB — TSH: TSH: 2.12 u[IU]/mL (ref 0.450–4.500)

## 2021-02-06 NOTE — Patient Instructions (Addendum)
Medication Instructions:  Your physician recommends that you continue on your current medications as directed. Please refer to the Current Medication list given to you today.  *If you need a refill on your cardiac medications before your next appointment, please call your pharmacy*   Lab Work: TODAY:  CMET, MAG, LIPID, CBC, & TSH  If you have labs (blood work) drawn today and your tests are completely normal, you will receive your results only by: [place] (if you have MyChart) OR A paper copy in the mail If you have any lab test that is abnormal or we need to change your treatment, we will call you to review the results.   Testing/Procedures: None ordered   Follow-Up: At [facility], you and your health needs are our priority.  As part of our continuing mission to provide you with exceptional heart care, we have created designated Provider Care Teams.  These Care Teams include your primary Cardiologist (physician) and Advanced Practice Providers (APPs -  Physician Assistants and Nurse Practitioners) who all work together to provide you with the care you need, when you need it.  We recommend signing up for the patient portal called "MyChart".  Sign up information is provided on this After Visit Summary.  MyChart is used to connect with patients for Virtual Visits (Telemedicine).  Patients are able to view lab/test results, encounter notes, upcoming appointments, etc.  Non-urgent messages can be sent to your provider as well.   To learn more about what you can do with MyChart, go to [email or web address].    Your next appointment:   6 month(s)  08/10/2021 arrive at 8:45   The format for your next appointment:   In Person  Provider:   Lauree Chandler, MD   Other Instructions Our office number is [phone].  It may be helpful to get established with a kidney doctor for your chronic kidney disease. Please discuss with your team at the [place].

## 2021-06-12 ENCOUNTER — Ambulatory Visit: Payer: Medicare Other | Admitting: Adult Health

## 2021-07-03 ENCOUNTER — Ambulatory Visit (INDEPENDENT_AMBULATORY_CARE_PROVIDER_SITE_OTHER): Payer: Medicare Other | Admitting: Adult Health

## 2021-07-03 ENCOUNTER — Encounter: Payer: Self-pay | Admitting: Adult Health

## 2021-07-03 VITALS — BP 138/62 | HR 44 | Ht 71.0 in | Wt 208.0 lb

## 2021-07-03 DIAGNOSIS — Z8673 Personal history of transient ischemic attack (TIA), and cerebral infarction without residual deficits: Secondary | ICD-10-CM

## 2021-07-03 DIAGNOSIS — I69319 Unspecified symptoms and signs involving cognitive functions following cerebral infarction: Secondary | ICD-10-CM

## 2021-07-03 NOTE — Patient Instructions (Addendum)
Your Plan:  Continue current treatment regimen  Contact cardiology regarding medication concerns at  [phone]      Thank you for coming to see [name] at [facility]. I hope we have been able to provide you high quality care today.  You may receive a patient satisfaction survey over the next few weeks. We would appreciate your feedback and comments so that we may continue to improve ourselves and the health of our patients.     Stroke Prevention Some medical conditions and lifestyle choices can lead to a higher risk for a stroke. You can help to prevent a stroke by eating healthy foods and exercising. It also helps to not smoke and to manage any health problems you may have. How can this condition affect me? A stroke is an emergency. It should be treated right away. A stroke can lead to brain damage or threaten your life. There is a better chance of surviving and getting better after a stroke if you get medical help right away. What can increase my risk? The following medical conditions may increase your risk of a stroke: Diseases of the heart and blood vessels (cardiovascular disease). High blood pressure (hypertension). Diabetes. High cholesterol. Sickle cell disease. Problems with blood clotting. Being very overweight. Sleeping problems (obstructivesleep apnea). Other risk factors include: Being older than age 56. A history of blood clots, stroke, or mini-stroke (TIA). Race, ethnic background, or a family history of stroke. Smoking or using tobacco products. Taking birth control pills, especially if you smoke. Heavy alcohol and drug use. Not being active. What actions can I take to prevent this? Manage your health conditions High cholesterol. Eat a healthy diet. If this is not enough to manage your cholesterol, you may need to take medicines. Take medicines as told by your doctor. High blood pressure. Try to keep your blood pressure below  130/80. If your blood pressure cannot be managed through a healthy diet and regular exercise, you may need to take medicines. Take medicines as told by your doctor. Ask your doctor if you should check your blood pressure at home. Have your blood pressure checked every year. Diabetes. Eat a healthy diet and get regular exercise. If your blood sugar (glucose) cannot be managed through diet and exercise, you may need to take medicines. Take medicines as told by your doctor. Talk to your doctor about getting checked for sleeping problems. Signs of a problem can include: Snoring a lot. Feeling very tired. Make sure that you manage any other conditions you have. Nutrition  Follow instructions from your doctor about what to eat or drink. You may be told to: Eat and drink fewer calories each day. Limit how much salt (sodium) you use to 1,500 milligrams (mg) each day. Use only healthy fats for cooking, such as olive oil, canola oil, and sunflower oil. Eat healthy foods. To do this: Choose foods that are high in fiber. These include whole grains, and fresh fruits and vegetables. Eat at least 5 servings of fruits and vegetables a day. Try to fill one-half of your plate with fruits and vegetables at each meal. Choose low-fat (lean) proteins. These include low-fat cuts of meat, chicken without skin, fish, tofu, beans, and nuts. Eat low-fat dairy products. Avoid foods that: Are high in salt. Have saturated fat. Have trans fat. Have cholesterol. Are processed or pre-made. Count how many carbohydrates you eat and drink each day. Lifestyle If you drink alcohol: Limit how much you have to: 0-1 drink a  day for women who are not pregnant. 0-2 drinks a day for men. Know how much alcohol is in your drink. In the [place] one drink equals one 12 oz bottle of beer (352mL), one 5 oz glass of wine (141mL), or one 1 oz glass of hard liquor (72mL). Do not smoke or use any products that have nicotine or  tobacco. If you need help quitting, ask your doctor. Avoid secondhand smoke. Do not use drugs. Activity  Try to stay at a healthy weight. Get at least 30 minutes of exercise on most days, such as: Fast walking. Biking. Swimming. Medicines Take over-the-counter and prescription medicines only as told by your doctor. Avoid taking birth control pills. Talk to your doctor about the risks of taking birth control pills if: You are over 42 years old. You smoke. You get very bad headaches. You have had a blood clot. Where to find more information American Stroke Association: www.strokeassociation.org Get help right away if: You or a loved one has any signs of a stroke. "BE FAST" is an easy way to remember the warning signs: B - Balance. Dizziness, sudden trouble walking, or loss of balance. E - Eyes. Trouble seeing or a change in how you see. F - Face. Sudden weakness or loss of feeling of the face. The face or eyelid may droop on one side. A - Arms. Weakness or loss of feeling in an arm. This happens all of a sudden and most often on one side of the body. S - Speech. Sudden trouble speaking, slurred speech, or trouble understanding what people say. T - Time. Time to call emergency services. Write down what time symptoms started. You or a loved one has other signs of a stroke, such as: A sudden, very bad headache with no known cause. Feeling like you may vomit (nausea). Vomiting. A seizure. These symptoms may be an emergency. Get help right away. Call your local emergency services (911 in the [place] Do not wait to see if the symptoms will go away. Do not drive yourself to the hospital. Summary You can help to prevent a stroke by eating healthy, exercising, and not smoking. It also helps to manage any health problems you have. Do not smoke or use any products that contain nicotine or tobacco. Get help right away if you or a loved one has any signs of a stroke. This information is not  intended to replace advice given to you by your health care provider. Make sure you discuss any questions you have with your health care provider. Document Revised: 01/17/2020 Document Reviewed: 01/17/2020 Elsevier Patient Education  [place].

## 2021-07-03 NOTE — Progress Notes (Signed)
[facility] [address] [place]. [place] [address] [phone]       STROKE FOLLOW UP NOTE  Mr. Dillon Smith Date of Birth:  11/01/1929 Medical Record Number:  [id number]   Reason for Referral: stroke follow up    CHIEF COMPLAINT:  Chief Complaint  Patient presents with   Follow-up    Rm 3 with spouse lisa  Pt is well and stable, no new concerns     HPI:   Update 07/03/2021 JM: Returns for 1 year stroke follow-up accompanied by his friend/neighbor [name].  Overall stable.  Denies new stroke/TIA symptoms.  Residual mild memory loss stable over the past year.  Continues to maintain ADLs majority of IADLs independently [name] does bill paying and medication set up). Lives alone.  Compliant on Eliquis and atorvastatin -denies side effects.  Blood pressure today 138/62.  On amlodipine 10mg  daily. [name] questions use of hydralazine - per epic review, started back in 08/2020, per recent cards OV, rec'd continued use for cardiomyopathy and LV dysfunction. [name] reports that was never started. F/u with cards 2/10. No further concerns at this time.     History provided for reference purposes only Update 06/19/2020 JM: [name] returns for 38-month stroke follow-up accompanied by his neighbor [name].  Stable since prior visit without new stroke/TIA symptoms.  Reports residual cognitive impairment with continued improvement.  Denies ongoing imbalance or right hand weakness.  Continues to live independently but does have a neighbor checking on him routinely.  Able to maintain ADLs and majority of IADLs independently including driving without difficulty.  Remains on Eliquis 2.5 mg twice daily for A. fib and secondary stroke prevention without bleeding or bruising.  Remains on atorvastatin 40 mg daily with recent LDL 65.  Blood pressure today 138/88.  No concerns at this time.  Update 12/21/2019 JM: [name] returns for follow-up regarding right MCA and PCA infarcts secondary to A.  fib not on AC in 08/2019.  He is accompanied by his neighbor.  He has been stable since prior visit with short term memory impairment and delayed recall, mild imbalance and mild difficulty with writing all present since his stroke but does endorse gradual improvement.  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  Continues to live independently with his neighbor checking on him throughout the day.  Continues on Eliquis for secondary stroke prevention and history of atrial fibrillation without bleeding or bruising.  Continues on atorvastatin 40 mg daily without myalgias.  He has not had repeat lipid panel since hospitalization.  Blood pressure today 144/66. He continues on amlodipine 10mg  daily. He has since established care with cardiology for monitoring management of A. fib and hx of CAD.  No concerns at this time.  Initial visit 09/21/2019 JM: Mr. Dillon Smith is a 88 year old male who is being seen today for hospital follow-up accompanied by family friend. He has recovered well from a stroke standpoint with mild cognitive impairment but this has greatly been improving.  He continues to live alone but his neighbor will check on him throughout the day and assist as needed.  Continues on Eliquis and atorvastatin 40 mg daily for secondary stroke prevention without side effects.  He does not have a current cardiologist as he is currently followed by [facility] but he is requesting to establish care locally.  Blood pressure today initially 108/57 and on recheck 128/58 with heart rate 60.  Continues on amlodipine 10 mg daily.  He does not  routinely monitor at home.  No concerns at this time.  Stroke admission 08/15/2019: Mr. Dillon Smith is a 88 y.o. male with history of AF not on AC, CAD, HTN who presented on 08/15/2019 with dysarthria, L facial droop, L hemiparesis and R gaze preference.  Evaluated by stroke team and  Dr. Erlinda Hong with stroke work-up revealing multiple right MCA PCA infarcts  likely embolic secondary to AF not on [facility].  Received tPA 08/15/2019 at 9798 without complication.  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  2D echo EF of 45 to 50% without cardiac source of embolus identified.  Previously on aspirin and recommend transitioning to Eliquis with history of AF and for secondary stroke prevention as no obvious contraindication for [facility].  Positive Covid testing upon arrival to ED asymptomatic.  History of HTN stable during admission resuming amlodipine and metoprolol.  LDL 101 increased atorvastatin to 40 mg daily.  No prior history of DM with A1c 6.3.  Other stroke risk factors include advanced age, former tobacco use, overweight, CAD but no prior history of stroke.  Other active problems include CKD stage III.  Residual deficits of mild dysarthria, left facial weakness and dysphagia and discharged home with recommendation of home health therapies.  Stroke: multiple R MCA and PCA infarcts likely embolic secondary to AF not on AC CT head No acute abnormality. ASPECTS 10.    CTA head & neck R M3 occlusion. B ICA bifurcation atherosclerosis w/ R ICA 15% stenosis. ICA siphon atherosclerosis but w/o > 15% stenosis. 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 BA >80% stenosis.  Aortic atherosclerosis.  CT perfusion diminished CBV, CBF and MTT in frontal operculum MRI multifocal R MCA and PCA infarcts. Small vessel disease.  MRA no ELVO. Moderate narrowing BA exaggerated by artifact.  2D Echo EF 45-50%. No source of embolus  LDL 101 HgbA1c 6.3   ROS:   14 system review of systems performed and negative with exception of those listed in HPI  PMH:  Past Medical History:  Diagnosis Date   Bradycardia    CKD (chronic kidney disease), stage III ([place])    Coronary artery disease    CABG 2007   COVID-19 08/2019   Hyperlipidemia LDL goal <70    Hypertension    Persistent atrial  fibrillation ([place])    Prostate cancer ([place]) 2010   PVC's (premature ventricular contractions)    Stroke (cerebrum) ([facility])     PSH:  Past Surgical History:  Procedure Laterality Date   COLONOSCOPY  2004   INSERTION PROSTATE RADIATION SEED  2010    Social History:  Social History   Socioeconomic History   Marital status: Divorced    Spouse name: Not on file   Number of children: Not on file   Years of education: Not on file   Highest education level: Not on file  Occupational History   Not on file  Tobacco Use   Smoking status: Former   Smokeless tobacco: Never  Substance and Sexual Activity   Alcohol use: Not Currently   Drug use: Not Currently   Sexual activity: Not on file  Other Topics Concern   Not on file  Social History Narrative   Not on file   Social Determinants of Health   Financial Resource Strain: Not on file  Food Insecurity: Not on file  Transportation Needs: Not on file  Physical Activity: Not on  file  Stress: Not on file  Social Connections: Not on file  Intimate Partner Violence: Not on file    Family History:  Family History  Family history unknown: Yes    Medications:   Current Outpatient Medications on File Prior to Visit  Medication Sig Dispense Refill   amLODipine (NORVASC) 10 MG tablet Take 10 mg by mouth daily.     apixaban (ELIQUIS) 2.5 MG TABS tablet Take 1 tablet (2.5 mg total) by mouth 2 (two) times daily. 180 tablet 1   atorvastatin (LIPITOR) 40 MG tablet Take 1 tablet (40 mg total) by mouth at bedtime. 30 tablet 2   Multiple Vitamin (MULTI VITAMIN DAILY PO) Take by mouth daily.     No current facility-administered medications on file prior to visit.    Allergies:  No Known Allergies   Physical Exam  Vitals:   07/03/21 1038  BP: 138/62  Pulse: (!) 44  Weight: 208 lb (94.3 kg)  Height: 5\' 11"  (1.803 m)   Body mass index is 29.01 kg/m. No results found.  General: well developed, well nourished, pleasant elderly  African-American male, seated, in no evident distress Head: head normocephalic and atraumatic.   Neck: supple with no carotid or supraclavicular bruits Cardiovascular: irregular rate and rhythm, no murmurs Musculoskeletal: no deformity Skin:  no rash/petichiae Vascular:  Normal pulses all extremities   Neurologic Exam Mental Status: Awake and fully alert. Oriented to place and time. Recent memory impaired and remote memory intact. Attention span, concentration and fund of knowledge appropriate during visit. Mood and affect appropriate. Recall: 2/3. 4 legged animal naming 12 in 60 seconds. Serial addition good Cranial Nerves: Pupils equal, briskly reactive to light. Extraocular movements full without nystagmus. Visual fields full to confrontation.  HOH bilaterally. Facial sensation intact. Face, tongue, palate moves normally and symmetrically.  Motor: Normal bulk and tone. Normal strength in all tested extremity muscles Sensory.: intact to touch , pinprick , position and vibratory sensation.  Coordination: Rapid alternating movements normal in all extremities. Finger-to-nose and heel-to-shin performed accurately bilaterally. Gait and Station: Arises from chair without difficulty. Stance is normal. Gait demonstrates normal stride length and balance without assistive device.  Able to tandem walk and heel toe without difficulty.  Romberg negative. Reflexes: 1+ and symmetric. Toes downgoing.       ASSESSMENT: Dillon Smith is a 88 y.o. year old male presented with dysarthria, left facial droop, left hemiparesis and right gaze preference on 08/15/2019 with stroke work-up revealing multiple right MCA PCA infarcts likely embolic secondary to known AF not on [facility] therefore Eliquis initiated for secondary stroke prevention. Vascular risk factors include HTN, HLD, CAD s/p CABG and AF previously not on AC.      PLAN:  Right MCA/PCA stroke:  Residual mild cognitive impairment - stable. Encouraged  memory exercises, routine exercise, healthy diet, adequate sleep and management of stroke risk factors to help prevent/slow gradual decline Continue  Eliquis 2.5 mg twice daily   and atorvastatin 40 mg daily for secondary stroke prevention.  Discussed secondary stroke prevention measures and importance of close PCP and cardiology follow-up for aggressive stroke risk factor management Persistent atrial fibrillation: On Eliquis 2.5mg  BID per cardiology. Dose appropriate per age and CR >1.5 HTN: BP goal<130/90.  Stable on amlodipine 10 mg daily per PCP/cardiology. Advised to contact cardiology re: hydralazine concerns HLD: LDL goal<70.  LDL 60 (01/2021) on atorvastatin 40 mg daily per cardiology    Overall stable from stroke standpoint routinely [facility] and [facility]ology - may f/u here as needed   CC: [facility], [name] PCP   I spent 27 minutes of face-to-face and non-face-to-face time with patient and his neighbor, [name].  This included previsit chart review, lab review, study review, electronic health record documentation, patient education regarding prior stroke with residual deficits, importance of managing stroke risk factors and answered all questions to patient and neighbors satisfaction  Frann Rider, AGNP-BC  [facility] [address] [place], [place] [address]  Phone [phone] Fax [phone] Note: This document was prepared with digital dictation and possible smart phrase technology. Any transcriptional errors that result from this process are unintentional.

## 2021-08-09 NOTE — Progress Notes (Signed)
Chief Complaint  Patient presents with   Follow-up    Atrial fib    History of Present Illness: 88 yo male with history of CAD s/p CABG in 2007, HTN, CVA in 2021, persistent atrial fib, bradycardia, prostate cancer, hyperlipidemia, stage III CKD. He was seen at [facility]: [facility] in 2021 after having a stroke. He was found atrial fibrillation and was started on anticoagulation with Eliquis. Echo February 2021 with LVEF=45-50%, global hypokinesis, mild to moderate mitral regurgitation. He has not been on a beta blocker due to bradycardia and has not been on a Ace-inh/ARB due to renal insufficiency.   He is here today for follow up. The patient denies any chest pain, dyspnea, palpitations, lower extremity edema, orthopnea, PND, dizziness, near syncope or syncope.   Primary Care Physician: [name]  Past Medical History:  Diagnosis Date   Bradycardia    CKD (chronic kidney disease), stage III ([place])    Coronary artery disease    CABG 2007   COVID-19 08/2019   Hyperlipidemia LDL goal <70    Hypertension    Persistent atrial fibrillation ([place])    Prostate cancer ([place]) 2010   PVC's (premature ventricular contractions)    Stroke (cerebrum) ([place])     Past Surgical History:  Procedure Laterality Date   COLONOSCOPY  2004   INSERTION PROSTATE RADIATION SEED  2010    Current Outpatient Medications  Medication Sig Dispense Refill   amLODipine (NORVASC) 10 MG tablet Take 10 mg by mouth daily.     apixaban (ELIQUIS) 2.5 MG TABS tablet Take 1 tablet (2.5 mg total) by mouth 2 (two) times daily. 180 tablet 1   atorvastatin (LIPITOR) 40 MG tablet Take 1 tablet (40 mg total) by mouth at bedtime. 30 tablet 2   Multiple Vitamin (MULTI VITAMIN DAILY PO) Take by mouth daily.     No current facility-administered medications for this visit.    No Known Allergies  Social History   Socioeconomic History   Marital status: Divorced    Spouse name: Not on file   Number of children: Not on file    Years of education: Not on file   Highest education level: Not on file  Occupational History   Not on file  Tobacco Use   Smoking status: Former   Smokeless tobacco: Never  Substance and Sexual Activity   Alcohol use: Not Currently   Drug use: Not Currently   Sexual activity: Not on file  Other Topics Concern   Not on file  Social History Narrative   Not on file   Social Determinants of Health   Financial Resource Strain: Not on file  Food Insecurity: Not on file  Transportation Needs: Not on file  Physical Activity: Not on file  Stress: Not on file  Social Connections: Not on file  Intimate Partner Violence: Not on file    Family History  Family history unknown: Yes    Review of Systems:  As stated in the HPI and otherwise negative.   BP 118/64    Pulse 68    Ht 5\' 11"  (1.803 m)    Wt 201 lb 12.8 oz (91.5 kg)    SpO2 98%    BMI 28.15 kg/m   Physical Examination: General: Well developed, well nourished, NAD  HEENT: OP clear, mucus membranes moist  SKIN: warm, dry. No rashes. Neuro: No focal deficits  Musculoskeletal: Muscle strength 5/5 all ext  Psychiatric: Mood and affect normal  Neck: No JVD, no carotid bruits,  no thyromegaly, no lymphadenopathy.  Lungs:Clear bilaterally, no wheezes, rhonci, crackles Cardiovascular: Regular rate and rhythm. Systolic murmur.  Abdomen:Soft. Bowel sounds present. Non-tender.  Extremities: No lower extremity edema. Pulses are 2 + in the bilateral DP/PT.  EKG:  EKG is not ordered today. The ekg ordered today demonstrates   Recent Labs: 02/06/2021: ALT 30; BUN 23; Creatinine, Ser 1.77; Hemoglobin 13.1; Magnesium 1.9; Platelets 184; Potassium 4.6; Sodium 143; TSH 2.120   Lipid Panel    Component Value Date/Time   CHOL 125 02/06/2021 0845   TRIG 41 02/06/2021 0845   HDL 55 02/06/2021 0845   CHOLHDL 2.3 02/06/2021 0845   CHOLHDL 6.3 08/16/2019 0242   VLDL 22 08/16/2019 0242   LDLCALC 60 02/06/2021 0845     Wt Readings  from Last 3 Encounters:  08/10/21 201 lb 12.8 oz (91.5 kg)  07/03/21 208 lb (94.3 kg)  02/06/21 205 lb (93 kg)     Assessment and Plan:   1. CAD s/p CABG without angina: He has no chest pain suggestive of angina. No ASA since he is on Eliquis. No beta blocker due to bradycardia. Continue statin. LDL 60 in August 2022.  2. Persistent atrial fibrillation: Rate is ok. Continue Eliquis.   3. HTN: BP controlled. No changes  4. Ischemic cardiomyopathy: LVEF around 45-50% in February 2021. He cannot tolerate GDMT secondary to issues above. Continue hydralazine  5. Mitral regurgitation: Mild to moderate by echo in 2021. Repeat echo in 2024.   Labs/ tests ordered today include:  No orders of the defined types were placed in this encounter.  Disposition:   F/U with me in one year.    Signed, Lauree Chandler, MD 08/10/2021 9:29 AM    [address], [place], [place]  [address] Phone: [phone]; Fax: [phone]

## 2021-08-10 ENCOUNTER — Ambulatory Visit (INDEPENDENT_AMBULATORY_CARE_PROVIDER_SITE_OTHER): Payer: Medicare Other | Admitting: Cardiovascular Disease

## 2021-08-10 ENCOUNTER — Other Ambulatory Visit: Payer: Self-pay

## 2021-08-10 ENCOUNTER — Encounter: Payer: Self-pay | Admitting: Cardiovascular Disease

## 2021-08-10 VITALS — BP 118/64 | HR 68 | Ht 71.0 in | Wt 201.8 lb

## 2021-08-10 DIAGNOSIS — I10 Essential (primary) hypertension: Secondary | ICD-10-CM | POA: Diagnosis not present

## 2021-08-10 DIAGNOSIS — I2510 Atherosclerotic heart disease of native coronary artery without angina pectoris: Secondary | ICD-10-CM

## 2021-08-10 DIAGNOSIS — I340 Nonrheumatic mitral (valve) insufficiency: Secondary | ICD-10-CM

## 2021-08-10 DIAGNOSIS — I4819 Other persistent atrial fibrillation: Secondary | ICD-10-CM | POA: Diagnosis not present

## 2021-08-10 DIAGNOSIS — I255 Ischemic cardiomyopathy: Secondary | ICD-10-CM | POA: Diagnosis not present

## 2021-08-10 NOTE — Patient Instructions (Signed)
Medication Instructions:  ?No changes ?*If you need a refill on your cardiac medications before your next appointment, please call your pharmacy* ? ? ?Lab Work: ?none ?If you have labs (blood work) drawn today and your tests are completely normal, you will receive your results only by: ?MyChart Message (if you have MyChart) OR ?A paper copy in the mail ?If you have any lab test that is abnormal or we need to change your treatment, we will call you to review the results. ? ? ?Testing/Procedures: ?none ? ? ?Follow-Up: ?At [facility], you and your health needs are our priority.  As part of our continuing mission to provide you with exceptional heart care, we have created designated Provider Care Teams.  These Care Teams include your primary Cardiologist (physician) and Advanced Practice Providers (APPs -  Physician Assistants and Nurse Practitioners) who all work together to provide you with the care you need, when you need it. ? ?We recommend signing up for the patient portal called "MyChart".  Sign up information is provided on this After Visit Summary.  MyChart is used to connect with patients for Virtual Visits (Telemedicine).  Patients are able to view lab/test results, encounter notes, upcoming appointments, etc.  Non-urgent messages can be sent to your provider as well.   ?To learn more about what you can do with MyChart, go to [email or web address].   ? ?Your next appointment:   ?12 month(s) ? ?The format for your next appointment:   ?In Person ? ?Provider:   ?Christopher McAlhany, MD   ? ? ?Other Instructions ?  ?

## 2021-08-13 ENCOUNTER — Telehealth: Payer: Self-pay | Admitting: *Deleted

## 2021-08-13 NOTE — Telephone Encounter (Signed)
A person brought in up dated med list for patient: med list updated.

## 2021-10-16 ENCOUNTER — Ambulatory Visit
Admission: RE | Admit: 2021-10-16 | Discharge: 2021-10-16 | Disposition: A | Discharge status: Home / Self Care | Payer: Self-pay | Source: Ambulatory Visit | Attending: Radiation Oncology | Admitting: Radiation Oncology

## 2021-10-16 ENCOUNTER — Other Ambulatory Visit: Payer: Self-pay | Admitting: Radiation Oncology

## 2021-10-16 DIAGNOSIS — C164 Malignant neoplasm of pylorus: Secondary | ICD-10-CM

## 2021-10-16 NOTE — Progress Notes (Signed)
GI Location of Tumor / Histology: Gastric Adenocarcinoma ? ?Dillon Smith presented ER with complaints of 2 weeks of GERD like symptoms followed by intractable emesis.  He notes a 30 pound weight loss in the prior year. ? ?PET 10/18/2021:  ? ?CT Abdomen 09/12/2021 ? ? ?Biopsies of Gastric Mass 09/14/2021 ? ? ? ? ?Past/Anticipated interventions by surgeon, if any:  ? ? ?Dr. Margalski/Dr. Streer ([facility]) ?-Patient with newly diagnosed gastric adenocarcinoma.  Staging not yet complete, and biopsy with limited tissue to assess depth.   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 ?-Due to limited tissue volume, will send TEMPUS with IHC for MMR and PD-L1. ?-Given unclear staging, will refer to surg onc and rad onc. ?-If patient is radiation candidate, would favor chemoradiation for local control. ?-Follow-up 5/2 ? ? ?Past/Anticipated interventions by medical oncology, if any:  ? ? ?Weight changes, if any: Has lost about 30 pounds since this started and has since gained about 10 back. ? ?Bowel/Bladder complaints, if any: None ? ?Nausea / Vomiting, if any: No ? ?Pain issues, if any: No  ? ?   ? ?SAFETY ISSUES: ?Prior radiation? Seed Implant with Iodine-125 to a total dose of 16,000 cGy, in 2004 with Dr. Goodchild ?Pacemaker/ICD? No ?Possible current pregnancy? N/a ?Is the patient on methotrexate? No ? ?Current Complaints/Details: ? ?

## 2021-10-17 ENCOUNTER — Other Ambulatory Visit: Payer: Self-pay

## 2021-10-17 ENCOUNTER — Encounter: Payer: Self-pay | Admitting: Radiation Oncology

## 2021-10-17 ENCOUNTER — Ambulatory Visit
Admission: RE | Admit: 2021-10-17 | Discharge: 2021-10-17 | Disposition: A | Discharge status: Home / Self Care | Payer: Medicare Other | Source: Ambulatory Visit | Attending: Radiation Oncology | Admitting: Radiation Oncology

## 2021-10-17 VITALS — BP 133/87 | HR 71 | Temp 97.3°F | Resp 18 | Ht 71.0 in | Wt 195.1 lb

## 2021-10-17 DIAGNOSIS — Z8546 Personal history of malignant neoplasm of prostate: Secondary | ICD-10-CM | POA: Insufficient documentation

## 2021-10-17 DIAGNOSIS — Z79899 Other long term (current) drug therapy: Secondary | ICD-10-CM | POA: Diagnosis not present

## 2021-10-17 DIAGNOSIS — Z8673 Personal history of transient ischemic attack (TIA), and cerebral infarction without residual deficits: Secondary | ICD-10-CM | POA: Insufficient documentation

## 2021-10-17 DIAGNOSIS — I4819 Other persistent atrial fibrillation: Secondary | ICD-10-CM | POA: Diagnosis not present

## 2021-10-17 DIAGNOSIS — N183 Chronic kidney disease, stage 3 unspecified: Secondary | ICD-10-CM | POA: Diagnosis not present

## 2021-10-17 DIAGNOSIS — I129 Hypertensive chronic kidney disease with stage 1 through stage 4 chronic kidney disease, or unspecified chronic kidney disease: Secondary | ICD-10-CM | POA: Insufficient documentation

## 2021-10-17 DIAGNOSIS — I7143 Infrarenal abdominal aortic aneurysm, without rupture: Secondary | ICD-10-CM | POA: Insufficient documentation

## 2021-10-17 DIAGNOSIS — Z7901 Long term (current) use of anticoagulants: Secondary | ICD-10-CM | POA: Insufficient documentation

## 2021-10-17 DIAGNOSIS — Z8616 Personal history of COVID-19: Secondary | ICD-10-CM | POA: Insufficient documentation

## 2021-10-17 DIAGNOSIS — C163 Malignant neoplasm of pyloric antrum: Secondary | ICD-10-CM

## 2021-10-17 DIAGNOSIS — I2510 Atherosclerotic heart disease of native coronary artery without angina pectoris: Secondary | ICD-10-CM | POA: Insufficient documentation

## 2021-10-17 DIAGNOSIS — Z87891 Personal history of nicotine dependence: Secondary | ICD-10-CM | POA: Diagnosis not present

## 2021-10-17 DIAGNOSIS — E785 Hyperlipidemia, unspecified: Secondary | ICD-10-CM | POA: Diagnosis not present

## 2021-10-17 DIAGNOSIS — Z923 Personal history of irradiation: Secondary | ICD-10-CM | POA: Insufficient documentation

## 2021-10-17 NOTE — Progress Notes (Signed)
?Radiation Oncology         [phone] ?________________________________ ? ?Name: Dillon Smith        MRN: [id number]  ?Date of Service: 10/17/2021 DOB: 08-29-1929 ? ?[id number], Milon Score, MD    ? ?REFERRING PHYSICIAN: Rhoderick Moody, MD ? ? ?DIAGNOSIS: The encounter diagnosis was Cancer of antrum of stomach ([place]). ? ? ?HISTORY OF PRESENT ILLNESS: Dillon Smith is a 88 y.o. male seen at the request of Dr. Jeannene Patella at the [facility] with a newly diagnosed gastric cancer. The patient presented to the ED at the [facility] with nausea and vomiting. Imaging on 09/12/21 with CT showed dilated stomach with nodular thickening along the antrum and limited views of the distal stomach due to debris. Peripyloric nodes were prominent but difficult to fully asses, and a 2.5 cm mass in the left adrenal gland and thickening of the right adrenal gland without focal mass were noted. An infrarenal AAA measuring 3.6 cm was also noted, as well as seeds in the prostate from prior brachytherapy. He underwent Endoscopy on 09/14/21 showed a mass in the pyloric channel and biopsy was consistent with adenocarcinoma. He has been seen by the medical oncology team at the [facility]. Dr. Jeannene Patella has ordered a PET scan but this has not been scheduled. He's seen today to discuss next steps for treatment of his cancer.  ? ? ? ?PREVIOUS RADIATION THERAPY: Yes  ? ?04/08/03 Radioactive Seed Implant of the Prostate with Dr. Danny Lawless ? ? ?PAST MEDICAL HISTORY:  ?Past Medical History:  ?Diagnosis Date  ? Bradycardia   ? CKD (chronic kidney disease), stage III ([place])   ? Coronary artery disease   ? CABG 2007  ? COVID-19 08/2019  ? Hyperlipidemia LDL goal <70   ? Hypertension   ? Persistent atrial fibrillation ([place])   ? Prostate cancer ([place]) 2010  ? PVC's (premature ventricular contractions)   ? Stroke (cerebrum) ([place])   ?   ? ? ?PAST SURGICAL HISTORY: ?Past Surgical History:  ?Procedure Laterality Date  ? COLONOSCOPY  2004  ? INSERTION  PROSTATE RADIATION SEED  2010  ? stent    ? ? ? ?FAMILY HISTORY:  ?Family History  ?Family history unknown: Yes  ? ? ? ?SOCIAL HISTORY:  reports that he has quit smoking. He has never used smokeless tobacco. He reports that he does not currently use alcohol. He reports that he does not currently use drugs. The patient is widowed and lives in [place]. He is retired from working in [employer]. He has two adult daughters who live in [place] near [place]. and [place]. He is accompanied by his friend [name], who is a neighbor who helps him with transportation and medical decision making.  ? ? ?ALLERGIES: Patient has no known allergies. ? ? ?MEDICATIONS:  ?Current Outpatient Medications  ?Medication Sig Dispense Refill  ? amLODipine (NORVASC) 10 MG tablet Take 10 mg by mouth daily.    ? apixaban (ELIQUIS) 2.5 MG TABS tablet Take 1 tablet (2.5 mg total) by mouth 2 (two) times daily. 180 tablet 1  ? atorvastatin (LIPITOR) 40 MG tablet Take 1 tablet (40 mg total) by mouth at bedtime. 30 tablet 2  ? UNABLE TO FIND Med Name: supplement: tangy tangerine 2.0 tablets    ? Multiple Vitamin (MULTI VITAMIN DAILY PO) Take by mouth daily. (Patient not taking: Reported on 08/13/2021)    ? ?No current facility-administered medications for this encounter.  ? ? ? ?REVIEW OF SYSTEMS: On review of  systems, the patient reports that he is doing fairly well. He's lost more than 45 pounds in the last year, but it sounds as though this started after his stroke, and has been gradual. He is currently on a low residue diet with soft foods and full liquids which he is able to tolerate. He denies any episodes of nausea or vomiting since his hospitalization for gastric outlet obstruction. He denies any hemoptysis at that time. He denies any postprandial pain or pain anywhere else in his body. He has a "fatty tumor" of his anterior neck that has been slowly growing over the last 5 years. He reports he was seen in dermatology and told this was a fat  tumor, and did not need anything to treat this. His neighbor [name] feels like it may have recently gotten smaller with his weight loss. He denies any pain with swallowing or laying flat. No other complaints are verbalized.  ? ?  ? ?PHYSICAL EXAM:  ?Wt Readings from Last 3 Encounters:  ?10/17/21 195 lb 2 oz (88.5 kg)  ?08/10/21 201 lb 12.8 oz (91.5 kg)  ?07/03/21 208 lb (94.3 kg)  ? ?Temp Readings from Last 3 Encounters:  ?10/17/21 (!) 97.3 ?F (36.3 ?C) (Temporal)  ?02/01/20 98 ?F (36.7 ?C) (Oral)  ?09/21/19 (!) 97 ?F (36.1 ?C)  ? ?BP Readings from Last 3 Encounters:  ?10/17/21 133/87  ?08/10/21 118/64  ?07/03/21 138/62  ? ?Pulse Readings from Last 3 Encounters:  ?10/17/21 71  ?08/10/21 68  ?07/03/21 (!) 44  ? ?Pain Assessment ?Pain Score: 0-No pain/10 ? ?In general this is a well appearing African American male who appears younger than his stated age in no acute distress. He's alert and oriented x4 and appropriate throughout the examination. Cardiopulmonary assessment is negative for acute distress and he exhibits normal effort.  ? ? ? ?ECOG = 1 ? ?0 - Asymptomatic (Fully active, able to carry on all predisease activities without restriction) ? ?1 - Symptomatic but completely ambulatory (Restricted in physically strenuous activity but ambulatory and able to carry out work of a light or sedentary nature. For example, light housework, office work) ? ?2 - Symptomatic, <50% in bed during the day (Ambulatory and capable of all self care but unable to carry out any work activities. Up and about more than 50% of waking hours) ? ?3 - Symptomatic, >50% in bed, but not bedbound (Capable of only limited self-care, confined to bed or chair 50% or more of waking hours) ? ?4 - Bedbound (Completely disabled. Cannot carry on any self-care. Totally confined to bed or chair) ? ?5 - Death ? ? Oken MM, Creech RH, Tormey DC, et al. [phone]). "Toxicity and response criteria of the [facility]". [place] Oncol. 5  (6): 649-55 ? ? ? ?LABORATORY DATA:  ?Lab Results  ?Component Value Date  ? WBC 5.7 02/06/2021  ? HGB 13.1 02/06/2021  ? HCT 40.1 02/06/2021  ? MCV 96 02/06/2021  ? PLT 184 02/06/2021  ? ?Lab Results  ?Component Value Date  ? NA 143 02/06/2021  ? K 4.6 02/06/2021  ? CL 106 02/06/2021  ? CO2 22 02/06/2021  ? ?Lab Results  ?Component Value Date  ? ALT 30 02/06/2021  ? AST 27 02/06/2021  ? ALKPHOS 84 02/06/2021  ? BILITOT 0.5 02/06/2021  ? ?  ? ?RADIOGRAPHY: No results found. ?   ? ?IMPRESSION/PLAN: ?1. Adenocarcinoma of the distal stomach. Dr. Lisbeth Renshaw discusses the pathology findings and reviews the nature of  carcinomas of the GI tract specifically of the stomach.  We discussed the rationale for PET scan and agree with Dr. Jeannene Patella at the [facility] for proceeding with this.  Logistically it is quite difficult to get this patient a PET scan through the [place] as he would have to go to [place] to have this performed.  Rather we discussed proceeding with a PET scan in our institution and have this scheduled for tomorrow.  We have been in touch with the team at the [facility] so they will cancel his orders there.  We discussed that the results of his PET scan determine approaches.  Given his age and comorbidities he is not likely a surgical candidate.  If he has locally advanced disease despite his age he seems to be well enough to consider an aggressive form of chemoradiation, however if he has metastatic disease we discussed the rationale for a palliative course of radiotherapy.  If he has locally advanced disease, he would consider relocating his medical oncology care here to [facility] as well for purposes of logistics and we discussed the risks, benefits, short, and long term effects of radiotherapy, as well as the curative for efforts to avoid delays in his treatment.  Versus palliative intent, and the patient is interested in proceeding once we have clarification on his PET imaging results. Dr. Lisbeth Renshaw discusses the delivery and  logistics of radiotherapy and anticipates a course of between 2 to 5-1/2  weeks of radiotherapy to the stomach.  Once we have clarification we will coordinate simulation and proceed with consent.  ?2. Remote hist

## 2021-10-18 ENCOUNTER — Ambulatory Visit (HOSPITAL_COMMUNITY)
Admission: RE | Admit: 2021-10-18 | Discharge: 2021-10-18 | Disposition: A | Discharge status: Home / Self Care | Payer: Medicare Other | Source: Ambulatory Visit | Attending: Radiation Oncology | Admitting: Radiation Oncology

## 2021-10-18 DIAGNOSIS — C163 Malignant neoplasm of pyloric antrum: Secondary | ICD-10-CM

## 2021-10-18 LAB — GLUCOSE, CAPILLARY: Glucose-Capillary: 86 mg/dL (ref 70–99)

## 2021-10-18 MED ORDER — FLUDEOXYGLUCOSE F - 18 (FDG) INJECTION
9.6600 | Freq: Once | INTRAVENOUS | Status: AC | PRN
Start: 2021-10-18 — End: 2021-10-18
  Administered 2021-10-18: 9.66 via INTRAVENOUS

## 2021-10-19 ENCOUNTER — Encounter: Payer: Self-pay | Admitting: Radiation Oncology

## 2021-10-19 NOTE — Progress Notes (Signed)
I called and spoke with the patient's neighbor Dillon Smith his surrogate Research scientist (physical sciences).  She took the message as we discussed his PET scan results, and the rationale to consider chemoradiation versus palliative therapy with radiation.  It does not appear that he has metastatic disease but he is not likely a good surgical candidate given his age.  Rather treatment would be favored to be chemoradiation if he can tolerate this.  S he will discuss this with him and we will follow-up with decision-making next week.  They are also aware of the option to meet with medical oncology in [place] as receiving systemic therapy is quite much to coordinate logistically through the [place].

## 2021-10-22 ENCOUNTER — Telehealth: Payer: Self-pay | Admitting: Radiation Oncology

## 2021-10-22 NOTE — Telephone Encounter (Signed)
I received a call from Ms. Dillon Smith, Mr. Dillon Smith neighbor and surrogate decision maker. We were able to get Mr. Dillon Smith on the phone as well. He has decided if he were to have treatment with chemo he would want that in [place]. He is still considering definitive chemoRT vs. Palliative radiation even though his PET does not show advanced disease. He's not likely a surgical candidate however. I'll reach out to the [place] for community care referral to Med Onc here at [facility].  ?

## 2021-10-23 ENCOUNTER — Telehealth: Payer: Self-pay | Admitting: Radiation Oncology

## 2021-10-23 NOTE — Telephone Encounter (Addendum)
I spoke with Dillon Smith, his neighbor Bedelia Person, and his daughter Reine Just.  We reviewed the findings to date and his work-up for his stomach cancer.  We discussed again that he does not appear to be a good surgical candidate given his age, but that treatment of chemoradiation has been recommended by the [place], and agreed upon that this would be a reasonable treatment to consider.  The alternative would be palliative radiation.  The family is interested in proceeding with treatment, they are leaning more towards chemoradiation but want to discuss this a little bit further with family.  They did call back within a few minutes to confirm desire for chemoRT in our center, and we will coordinate with the [place] to arrange for community care referral.  ?

## 2021-10-25 ENCOUNTER — Ambulatory Visit
Admission: RE | Admit: 2021-10-25 | Discharge: 2021-10-25 | Disposition: A | Discharge status: Home / Self Care | Payer: Medicare Other | Source: Ambulatory Visit | Attending: Radiation Oncology | Admitting: Radiation Oncology

## 2021-10-25 ENCOUNTER — Telehealth: Payer: Self-pay | Admitting: Nurse Practitioner

## 2021-10-25 ENCOUNTER — Other Ambulatory Visit: Payer: Self-pay

## 2021-10-25 DIAGNOSIS — C163 Malignant neoplasm of pyloric antrum: Secondary | ICD-10-CM | POA: Diagnosis present

## 2021-10-25 NOTE — Telephone Encounter (Signed)
Scheduled appt per 4/27 referral. Spoke to social worker for pt who will let pt know of appt date and time.   ?

## 2021-10-25 NOTE — Progress Notes (Signed)
I called [facility] pathology department, [phone] ext [phone].  I spoke with Ulice Dash requesting results from molecular testing.  Ulice Dash states the samples were sent for molecular testing 10/22/2021 and results will not be available for 3-4 weeks. ?

## 2021-10-29 ENCOUNTER — Inpatient Hospital Stay (HOSPITAL_BASED_OUTPATIENT_CLINIC_OR_DEPARTMENT_OTHER): Payer: Medicare Other | Admitting: Nurse Practitioner

## 2021-10-29 ENCOUNTER — Encounter: Payer: Self-pay | Admitting: Nurse Practitioner

## 2021-10-29 ENCOUNTER — Other Ambulatory Visit: Payer: Self-pay

## 2021-10-29 ENCOUNTER — Inpatient Hospital Stay: Payer: Medicare Other | Attending: Nurse Practitioner

## 2021-10-29 VITALS — BP 128/62 | HR 75 | Temp 97.9°F | Resp 17 | Ht 71.0 in | Wt 194.5 lb

## 2021-10-29 DIAGNOSIS — Z8673 Personal history of transient ischemic attack (TIA), and cerebral infarction without residual deficits: Secondary | ICD-10-CM | POA: Insufficient documentation

## 2021-10-29 DIAGNOSIS — K311 Adult hypertrophic pyloric stenosis: Secondary | ICD-10-CM | POA: Diagnosis not present

## 2021-10-29 DIAGNOSIS — Z79899 Other long term (current) drug therapy: Secondary | ICD-10-CM | POA: Diagnosis not present

## 2021-10-29 DIAGNOSIS — Z8546 Personal history of malignant neoplasm of prostate: Secondary | ICD-10-CM

## 2021-10-29 DIAGNOSIS — E785 Hyperlipidemia, unspecified: Secondary | ICD-10-CM | POA: Diagnosis not present

## 2021-10-29 DIAGNOSIS — R1110 Vomiting, unspecified: Secondary | ICD-10-CM | POA: Insufficient documentation

## 2021-10-29 DIAGNOSIS — C163 Malignant neoplasm of pyloric antrum: Secondary | ICD-10-CM

## 2021-10-29 DIAGNOSIS — K5900 Constipation, unspecified: Secondary | ICD-10-CM | POA: Diagnosis not present

## 2021-10-29 DIAGNOSIS — I129 Hypertensive chronic kidney disease with stage 1 through stage 4 chronic kidney disease, or unspecified chronic kidney disease: Secondary | ICD-10-CM | POA: Insufficient documentation

## 2021-10-29 DIAGNOSIS — Z8616 Personal history of COVID-19: Secondary | ICD-10-CM | POA: Insufficient documentation

## 2021-10-29 DIAGNOSIS — I4891 Unspecified atrial fibrillation: Secondary | ICD-10-CM | POA: Insufficient documentation

## 2021-10-29 DIAGNOSIS — D631 Anemia in chronic kidney disease: Secondary | ICD-10-CM | POA: Diagnosis not present

## 2021-10-29 DIAGNOSIS — K219 Gastro-esophageal reflux disease without esophagitis: Secondary | ICD-10-CM | POA: Insufficient documentation

## 2021-10-29 DIAGNOSIS — N183 Chronic kidney disease, stage 3 unspecified: Secondary | ICD-10-CM

## 2021-10-29 LAB — CMP (CANCER CENTER ONLY)
ALT: 23 U/L (ref 0–44)
AST: 19 U/L (ref 15–41)
Albumin: 3.8 g/dL (ref 3.5–5.0)
Alkaline Phosphatase: 84 U/L (ref 38–126)
Anion gap: 5 (ref 5–15)
BUN: 23 mg/dL (ref 8–23)
CO2: 28 mmol/L (ref 22–32)
Calcium: 9.5 mg/dL (ref 8.9–10.3)
Chloride: 108 mmol/L (ref 98–111)
Creatinine: 1.4 mg/dL — ABNORMAL HIGH (ref 0.61–1.24)
GFR, Estimated: 48 mL/min — ABNORMAL LOW (ref >60)
Glucose, Bld: 106 mg/dL — ABNORMAL HIGH (ref 70–99)
Potassium: 4.4 mmol/L (ref 3.5–5.1)
Sodium: 141 mmol/L (ref 135–145)
Total Bilirubin: 0.4 mg/dL (ref 0.3–1.2)
Total Protein: 7.1 g/dL (ref 6.5–8.1)

## 2021-10-29 LAB — CBC WITH DIFFERENTIAL (CANCER CENTER ONLY)
Abs Immature Granulocytes: 0.02 10*3/uL (ref 0.00–0.07)
Basophils Absolute: 0 10*3/uL (ref 0.0–0.1)
Basophils Relative: 0 %
Eosinophils Absolute: 0.1 10*3/uL (ref 0.0–0.5)
Eosinophils Relative: 2 %
HCT: 37.1 % — ABNORMAL LOW (ref 39.0–52.0)
Hemoglobin: 12.1 g/dL — ABNORMAL LOW (ref 13.0–17.0)
Immature Granulocytes: 0 %
Lymphocytes Relative: 14 %
Lymphs Abs: 0.8 10*3/uL (ref 0.7–4.0)
MCH: 32.1 pg (ref 26.0–34.0)
MCHC: 32.6 g/dL (ref 30.0–36.0)
MCV: 98.4 fL (ref 80.0–100.0)
Monocytes Absolute: 0.5 10*3/uL (ref 0.1–1.0)
Monocytes Relative: 8 %
Neutro Abs: 4.1 10*3/uL (ref 1.7–7.7)
Neutrophils Relative %: 76 %
Platelet Count: 214 10*3/uL (ref 150–400)
RBC: 3.77 MIL/uL — ABNORMAL LOW (ref 4.22–5.81)
RDW: 14.4 % (ref 11.5–15.5)
WBC Count: 5.5 10*3/uL (ref 4.0–10.5)
nRBC: 0 % (ref 0.0–0.2)

## 2021-10-29 LAB — IRON AND IRON BINDING CAPACITY (CC-WL,HP ONLY)
Iron: 46 ug/dL (ref 45–182)
Saturation Ratios: 14 % — ABNORMAL LOW (ref 17.9–39.5)
TIBC: 323 ug/dL (ref 250–450)
UIBC: 277 ug/dL (ref 117–376)

## 2021-10-29 LAB — CEA (IN HOUSE-CHCC): CEA (CHCC-In House): 5.09 ng/mL — ABNORMAL HIGH (ref 0.00–5.00)

## 2021-10-29 LAB — FERRITIN: Ferritin: 50 ng/mL (ref 24–336)

## 2021-10-29 MED ORDER — CAPECITABINE 500 MG PO TABS
ORAL_TABLET | ORAL | 1 refills | Status: DC
  Filled 2021-10-29: qty 75, fill #0

## 2021-10-29 NOTE — Progress Notes (Signed)
I met with Dillon Smith during  his consultation with Cira Rue, NP and Dr Burr Medico.  I explained my role as a nurse navigator and provided my contact information.  I explained the services provided at [facility] and provided written information.  I explained the alight grant and let him know one of the financial advisors will reach out to him. I briefly reviewed common side effects associated with the recommended chemotherapy regimen,  Xeloda. I told him that he will receive a call from Wells Guiles our oral chemotherapy pharmacist who will provide chemotherapy education and administration instructions for Xeloda.  I told him we will be monitoring him week with lab and f/u visits with Dr Burr Medico or APP.  I told him our schedulers will call him with lab and f/u appts.  All questions were answered. He verbalized understanding. ? ?

## 2021-10-29 NOTE — Progress Notes (Addendum)
?[place]   ?Telephone:([phone]) [phone] [phone]   ?Clinic New Consult Note  ? ?Patient Care Team: ?Clinic, [name] as PCP - General ?[name], MD as PCP - Cardiology (Cardiology) ?Thompson Grayer, MD as PCP - Electrophysiology (Cardiology) ?[name], MD as Consulting Physician (Radiation Oncology) ?[name], RN as Equities trader ?[name], MD as Consulting Physician (Hematology) ?[name], NP as Nurse Practitioner (Nurse Practitioner) ?10/29/2021 ? ?CHIEF COMPLAINTS/PURPOSE OF CONSULTATION:  ?Gastric cancer, referred by [facility] oncology Dr. Jeannene Patella ? 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 A. pyloric channel biopsy ?-Adenocarcinoma involving superficial gastric mucosa ?Entheses the biopsy is superficial and the depth of invasion cannot be accurately determined) ?-Reactive gastropathy and benign duodenal mucosa ? ?B. gastric biopsy ?-Chronic active gastritis ?-Immunostain for H. pylori is negative ? ?NGS, MMR, PD-L1 are pending ?  ?09/14/2021 Initial Diagnosis  ? 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  ?09/16/2021 Imaging  ? CT AP w contrast  ?1.  Findings in keeping with gastric outlet obstruction from circumferential gastric antral mass lesion. Recommend correlation with EGD and biopsy.  ?2.  Indeterminate complex enhancing left adrenal mass. The degree of heterogeneity and enhancement would be atypical for adenoma. Metastatic disease or pheochromocytoma are therefore considerations.  ?3.  Bilobed infrarenal abdominal aortic aneurysm measuring up to 3.4 cm with right common iliac artery aneurysm.  ?4.  Small left pleural effusion. ?  ?09/21/2021 Procedure  ? EUS/EGD Findings  ?EGD: The esophagus was normal. The gastric body and antrum appeared  ?normal. Evidence of tumor was seen at the pylorus, and extending into the  ?pyloric channel and the duodenal bulb resulting in severe luminal  ?narrowing.  ? ?EUS was then  performed. FNA was performed.  ? ?Impression  ?Successful EUS-guided gastrojejunostomy using a lumen apposing metal stent  ?  ?10/18/2021 PET scan  ? IMPRESSION: ?1. The known adenocarcinoma involving the distal stomach is hypermetabolic. No definite nodal or hepatic metastases identified. ?2. No evidence of distant metastatic disease. ?3. Indeterminate left adrenal nodule is unchanged from outside CT and demonstrates no hypermetabolic activity. This measures higher in density than a typical adenoma. Attention on follow-up recommended. ?4. Decompressed stomach following gastrojejunostomy. ?5. 3.8 cm suprarenal abdominal aortic aneurysm with smaller infrarenal component. Recommend follow-up ultrasound every 2 years. This recommendation follows ACR consensus guidelines: White Paper of the ACR Incidental Findings Committee II on Vascular Findings. J Am Coll Radiol 2013; 10:789-794. Aortic Atherosclerosis (ICD10-I70.0). ?  ?  ? ?HISTORY OF PRESENTING ILLNESS:  ?Dillon Smith 88 y.o. male [employer] veteran with PMH including HTN, HL, CAD, Afib, stroke in 2022, CKD, and prostate cancer 04/2003 s/p brachytherapy is here because of newly diagnosed cancer of the distal stomach/pylorus, referred by local oncologist at the [facility].  He presented to [facility] ED with 2-week history of postprandial GERD symptoms, hiccuping, and intermittent regurgitation and reportedly 30 pounds weight loss in the year prior.  CTAP without contrast 09/12/2021 in the ED showed markedly distended stomach with large amount of debris and circumferential area of distal gastric/antral irregularity/wall thickening consistent with gastric outlet obstruction involving the distal stomach concerning for ulcerating gastric mass.  Incidental findings showed a solid 2 x 2.5 x 2.4 left adrenal mass and a 3.6 cm infrarenal abdominal aortic aneurysm.  He was admitted for further work-up including EGD 09/14/2021 which showed a pyloric channel narrowing and thickening  suggestive of a mass, the scope could be advanced.  Biopsies showed adenocarcinoma involving superficial gastric mucosa and reactive gastropathy with chronic active gastritis, H. pylori negative.  EUS 09/21/2021 showed tumor at the pylorus extending into the pyloric channel and the duodenal bulb resulting in severe luminal narrowing, a stent was placed. TN staging was not done in the EUS. He was able to be discharged to local oncology who ordered a PET scan 10/18/2021 showing hypermetabolic biopsy-proven adenocarcinoma of the distal stomach.  The left adrenal mass was not hypermetabolic, no other distant metastatic disease.  He transferred his care to [facility] and met with our radiation oncology team Worthy Flank, PA and Dr. Lisbeth Renshaw 10/17/2021 who discussed a course of chemoradiation versus palliative radiation alone.  He is here today to discuss the logistics of chemotherapy and other possible cancer treatment options. ? ?Socially he is single and lives alone, independent with ADLs.  A neighbor helps him with some things including medication.  Patient drives himself.  He bowls twice a week and continues to work on [employer].  He has no physical deficits from prior stroke.  He has 2 adult daughters who live out of state.  He denies alcohol or drug use.  He smoked cigarettes intermittently less than 1 pack/week but quit in 1976.  He denies known family history of cancer. ? ?Today he presents by himself, he feels much better after gastric stenting.  He can eat what ever he wants.  Constipation resolved.  Denies fatigue, dysphagia, nausea, vomiting, diarrhea, pain, bleeding, fever, chills, cough, chest pain, dyspnea. ? ? ? ? ? ?MEDICAL HISTORY:  ?Past Medical History:  ?Diagnosis Date  ? Bradycardia   ? CKD (chronic kidney disease), stage III ([place])   ? Coronary artery disease   ? CABG 2007  ? COVID-19 08/2019  ? Hyperlipidemia LDL goal <70   ? Hypertension   ? Persistent atrial fibrillation ([place])   ?  Prostate cancer ([place]) 2010  ? PVC's (premature ventricular contractions)   ? Stroke (cerebrum) ([place])   ? ? ?SURGICAL HISTORY: ?Past Surgical History:  ?Procedure Laterality Date  ? COLONOSCOPY  2004  ? INSERTION PROSTATE RADIATION SEED  2010  ? stent    ? ? ?SOCIAL HISTORY: ?Social History  ? ?Socioeconomic History  ? Marital status: Divorced  ?  Spouse name: Not on file  ? Number of children: 2  ? Years of education: Not on file  ? Highest education level: Not on file  ?Occupational History  ? Not on file  ?Tobacco Use  ? Smoking status: Former  ? Smokeless tobacco: Never  ? Tobacco comments:  ?  Smoked occasionally < 1 pack per week as an adult, quit 1976  ?Substance and Sexual Activity  ? Alcohol use: Not Currently  ? Drug use: Not Currently  ? Sexual activity: Not on file  ?Other Topics Concern  ? Not on file  ?Social History Narrative  ? Not on file  ? ?Social Determinants of Health  ? ?Financial Resource Strain: Not on file  ?Food Insecurity: Not on file  ?Transportation Needs: Not on file  ?Physical Activity: Not on file  ?Stress: Not on file  ?Social Connections: Not on file  ?Intimate Partner Violence: Not on file  ? ? ?FAMILY HISTORY: ?Family History  ?Problem Relation Age of Onset  ? Cancer Neg Hx   ? ? ?ALLERGIES:  is allergic to penicillins. ? ?MEDICATIONS:  ?Current Outpatient Medications  ?Medication Sig Dispense Refill  ? amLODipine (NORVASC) 10 MG tablet Take 10 mg by mouth  daily.    ? apixaban (ELIQUIS) 2.5 MG TABS tablet Take 1 tablet (2.5 mg total) by mouth 2 (two) times daily. 180 tablet 1  ? atorvastatin (LIPITOR) 40 MG tablet Take 1 tablet (40 mg total) by mouth at bedtime. 30 tablet 2  ? Multiple Vitamin (MULTI VITAMIN DAILY PO) Take by mouth daily. (Patient not taking: Reported on 08/13/2021)    ? UNABLE TO FIND Med Name: supplement: tangy tangerine 2.0 tablets    ? ?No current facility-administered medications for this visit.  ? ? ?REVIEW OF SYSTEMS:   ?Constitutional: Denies fatigue,  fevers, chills or abnormal night sweats (+)  ?Eyes: Denies blurriness of vision, double vision or watery eyes ?Ears, nose, mouth, throat, and face: Denies mucositis or sore throat ?Respiratory: Denies cough, dyspnea or w

## 2021-10-30 ENCOUNTER — Telehealth: Payer: Self-pay | Admitting: Pharmacist

## 2021-10-30 ENCOUNTER — Other Ambulatory Visit (HOSPITAL_COMMUNITY): Payer: Self-pay

## 2021-10-30 ENCOUNTER — Telehealth: Payer: Self-pay

## 2021-10-30 DIAGNOSIS — C163 Malignant neoplasm of pyloric antrum: Secondary | ICD-10-CM

## 2021-10-30 MED ORDER — CAPECITABINE 500 MG PO TABS: ORAL_TABLET | ORAL | 1 refills | Status: DC

## 2021-10-30 NOTE — Telephone Encounter (Signed)
Oral Oncology Patient Advocate Encounter ? ?Prior Authorization for Xeloda has been approved.   ? ?PA# [id number] ?Effective dates: 10/30/21 through 10/31/22 ? ?Patient must fill at [facility] ? ?Oral Oncology Clinic will continue to follow.  ? ?Wynn Maudlin CPHT ?Specialty Pharmacy Patient Advocate ?[place] ?Phone [phone] ?Fax [phone] ?10/30/2021 9:42 AM ? ?

## 2021-10-30 NOTE — Telephone Encounter (Signed)
Oral Oncology Patient Advocate Encounter ?  ?Received notification from [place] that prior authorization for Xeloda is required. ?  ?PA submitted on CoverMyMeds ?[place] ?Status is pending ?  ?Oral Oncology Clinic will continue to follow. ? ?Wynn Maudlin CPHT ?Specialty Pharmacy Patient Advocate ?[place] ?Phone [phone] ?Fax [phone] ?10/30/2021 8:53 AM ? ? ?

## 2021-10-30 NOTE — Telephone Encounter (Addendum)
Oral Oncology Pharmacist Encounter ? ?Received new prescription for Xeloda (capecitabine) for the treatment of gastric cancer in conjunction with radiation, planned for duration of radiation therapy. ? ?CBC w/ Diff and CMP from 10/29/21 assessed, noted patient with baseline CKD, Scr 1.40 mg/dL (CrCl ~44.6 mL/min). Dose has been renally dose adjusted. Prescription dose and frequency assessed for appropriateness. ? ?Current medication list in Epic reviewed, no relevant/significant DDIs with Xeloda identified. ? ?Evaluated chart and no patient barriers to medication adherence noted.  ? ?Patient agreement for treatment documented in MD note on 10/29/21. ? ?Patient's insurance requires prescription to be filled through [facility]. Called and confirmed copay with [facility] is $250. Will re-direct prescription to [facility] in [place] as patient has [place] referral on file as medication copay should be more affordable through their pharmacy. ? ?Oral Oncology Clinic will continue to follow for insurance authorization, copayment issues, initial counseling and start date. ? ?Leron Croak, PharmD, BCPS ?Hematology/Oncology Clinical Pharmacist ?[facility] Oral Chemotherapy Navigation Clinics ?[phone] ?10/30/2021 9:00 AM ? ?

## 2021-10-31 ENCOUNTER — Other Ambulatory Visit: Payer: Self-pay

## 2021-10-31 ENCOUNTER — Telehealth: Payer: Self-pay | Admitting: Hematology

## 2021-10-31 NOTE — Telephone Encounter (Signed)
We have discussed his case in our GI tumor board this morning.  Surgeon Dr. [name] has reviewed his image, and feels his disease is probably resectable.  We have spoke with patient, he is open to surgery if that is recommended, we will make a urgent referral [facility]. [name], will hold on radiation and chemotherapy which was scheduled to start next Monday.  ? ?Dillon Smith  ?10/31/2021  ?

## 2021-10-31 NOTE — Telephone Encounter (Signed)
Oral Chemotherapy Pharmacist Encounter ? ?I spoke with patient's significant other, Bedelia Person, as well as follow up phone call made to patient for overview of: Xeloda for the treatment of gastric cancer in conjunction with radiation, planned duration 5 1/2 weeks.  ? ?Counseled on administration, dosing, side effects, monitoring, drug-food interactions, safe handling, storage, and disposal. ? ?Patient will take Xeloda '500mg'$  tablets, 2 tablets ('1000mg'$ ) by mouth in AM and 3 tabs ('1500mg'$ ) by mouth in PM, within 30 minutes of finishing meals, on days of radiation only. ? ?Xeloda and radiation start date: 11/05/21 ? ?Adverse effects of Xeloda include but are not limited to: fatigue, decreased blood counts, GI upset, diarrhea, mouth sores, and hand-foot syndrome. ? ?Patient will obtain anti diarrheal and alert the office of 4 or more loose stools above baseline.  ? ?Reviewed importance of keeping a medication schedule and plan for any missed doses. No barriers to medication adherence identified. ? ?Medication reconciliation performed and medication/allergy list updated. ? ?Insurance authorization for Xeloda has been obtained. ?Medication is being filled through [facility] in [place] and will be overnight shipped and delivered to patient on 11/01/21. ? ?All questions answered. ? ?Bedelia Person and Mr. Dziedzic voiced understanding and appreciation.  ? ?Medication education handout and medication calendar placed in mail for patient. Patient knows to call the office with questions or concerns. Oral Chemotherapy Clinic phone number provided to patient.  ? ?Leron Croak, PharmD, BCPS ?Hematology/Oncology Clinical Pharmacist ?[facility] Oral Chemotherapy Navigation Clinics ?[phone] ?10/31/2021 11:37 AM ?

## 2021-10-31 NOTE — Progress Notes (Signed)
The proposed treatment discussed in conference is for discussion purpose only and is not a binding recommendation.  The patients have not been physically examined, or presented with their treatment options.  Therefore, final treatment plans cannot be decided.  

## 2021-10-31 NOTE — Progress Notes (Signed)
I spoke with Dillon Smith he is interested in a consult with Dr [name] for possible surgery.  I told him I will need to get a referral from the [place].  I told him that [facility] will call him for an appt after they receive the [place] referral.  I instructed him not to start taking Xeloda until we know if he is having surgery.  I told him that his radiation therapy will be put on hold as well.  I asked if I could speak with Dillon Smith and he granted me permission.  I left her a vm asking she return my call.  I provided my direct contact information. ?

## 2021-11-01 ENCOUNTER — Telehealth: Payer: Self-pay | Admitting: Radiation Oncology

## 2021-11-01 NOTE — Progress Notes (Signed)
The fax to request referral to Dr [name] repeated failed to complete.  I called [facility] oncology nurse navigator and left vm requesting assistance. ?

## 2021-11-01 NOTE — Progress Notes (Signed)
Request for servic [facility] form [id number], ov note, and note regarding CI Conference recommendations faxed to [place] Referral Department.  Fax number [phone].  Request is for referral to [facility], Dr Michaelle Birks for surgical consult. ?

## 2021-11-01 NOTE — Telephone Encounter (Signed)
I called and spoke with the patient's daughter [name] to make sure she knew we were delaying his start date as Dr. [name] would like to see him to make sure he is not a surgical candidate before starting chemoRT.  ?

## 2021-11-02 ENCOUNTER — Telehealth: Payer: Self-pay | Admitting: Hematology

## 2021-11-02 NOTE — Telephone Encounter (Signed)
Left message with follow-up appointments per 5/1 los. ?

## 2021-11-05 ENCOUNTER — Ambulatory Visit: Payer: Medicare Other | Admitting: Radiation Oncology

## 2021-11-06 ENCOUNTER — Ambulatory Visit: Payer: Medicare Other

## 2021-11-07 ENCOUNTER — Ambulatory Visit: Payer: Medicare Other

## 2021-11-08 ENCOUNTER — Ambulatory Visit: Payer: Medicare Other

## 2021-11-08 ENCOUNTER — Telehealth: Payer: Self-pay

## 2021-11-08 ENCOUNTER — Other Ambulatory Visit: Payer: Self-pay

## 2021-11-08 DIAGNOSIS — C169 Malignant neoplasm of stomach, unspecified: Secondary | ICD-10-CM

## 2021-11-08 NOTE — Telephone Encounter (Signed)
Anti coag letter has been sent  

## 2021-11-08 NOTE — Telephone Encounter (Signed)
[facility] Pre-operative Risk Assessment  ?   ?Request for surgical clearance:     Endoscopy Procedure ? ?What type of surgery is being performed?     EUS ? ?When is this surgery scheduled?     12/03/21 ? ?What type of clearance is required ?   Pharmacy ? ?Are there any medications that need to be held prior to surgery and how long? Eliquis ? ?Practice name and name of physician performing surgery?      [facility] ? ?What is your office phone and fax number?      Phone- [phone]  Fax- [phone] ? ?Anesthesia type (None, local, MAC, general) ?       MAC  ?

## 2021-11-08 NOTE — Telephone Encounter (Signed)
EUS has been scheduled for 12/03/21 at 730 am at [facility] with GM  ? ?Left message on machine to call back  ?

## 2021-11-08 NOTE — Telephone Encounter (Signed)
----- Message from Irving Copas., MD sent at 11/08/2021 12:25 PM EDT ----- ?Regarding: RE: Gastric cancer ?SA, ?Patient's in my experience to have true outlet obstruction most likely have at least a T3 on EUS staging.  With that being said if he is a good candidate and seems to be doing well with his current gastrojejunostomy, then we certainly can try to get him staged.  I will be away next week and our schedules are quite booked but I am hospital doctor at the end of the month and we can add this patient on for an EUS staging if DJ does not have something sooner.  Will you all need any further tissue that we take more biopsies of the mass? ? ?Quadre [place], ?Please offer this patient a staging gastric cancer EUS on 5/29 730 case.  If DJ has an earlier EUS slot then that can be used otherwise that should suffice. ? ?Thanks. ?GM ? ?----- Message ----- ?From: Dwan Bolt, MD ?Sent: 11/08/2021   9:04 AM EDT ?To: Milus Banister, MD, [name], MD, # ?Subject: RE: Gastric cancer                            ? ?I spoke with the heme/onc fellow at the [facility] this morning. He said that they do not have EUS capability at the [place], but that we should be able to refer him for an EUS here since he has already had referral for outside care from the [place].  ? ?Linna Hoff and Chester Holstein, would you mind reviewing this case and considering an EUS for staging? He has a gastric cancer in the antrum, presented last month with gastric outlet obstruction at the [place] and had an endoscopic GJ placed using an axios stent at [facility]. They did did not perform an EUS to stage the tumor at that time. He is 79 but extremely high functioning and is a surgical candidate, but may need chemo first depending on the stage. ? ?Thank you, ?Shelby ? ?----- Message ----- ?From: [name], RN ?Sent: 11/07/2021   3:19 PM EDT ?To: [name], MD, Hayden Pedro, PA-C, # ?Subject: RE: Gastric cancer                            ? ?[facility] [name]  that is Dr Rhoderick Moody.  The [facility] GI MD is Cassandra minor.  I am working ongetting their contact numbers for you. ? ?KB ?----- Message ----- ?From: [name], MD ?Sent: 11/07/2021   2:28 PM EDT ?To: [name], MD, Hayden Pedro, PA-C, # ?Subject: RE: Gastric cancer                            ? ?Wilburn Cornelia, ? ?I agree with you, thanks much for seeing him. I agree with EUS, not sure who will do it.  ? ?[name], could you f/u on his MMR/MRSI/PD-L1/HER2 test results? Could you contact his GI to see if they can do EUS staging? Could you give me his VA referral physician's contact info to let them know the plan?  ? ?Bryson Ha, we can cancel his scheduled radiation, [name] please cancel his lab and f/u with [name] also.  ? ?Thanks  ? ?Krista Blue  ?----- Message ----- ?From: Dwan Bolt, MD ?Sent: 11/07/2021  12:26 PM EDT ?To:  [name], MD ?Subject: Gastric cancer                                ? ?Krista Blue, ?I saw Mr. Micke this morning. He seems extremely fit and independent for his age, I actually think he is a reasonable surgical candidate. I reviewed surgery vs chemo/radiation, and he would like to pursue surgery. If you thought he would have a good response to immunotherapy, I would rather him try that first before surgery, but if he is not a candidate I will plan for surgery. I think he would need an EUS to accurately stage him first, if it is a more locally advanced tumor I would prefer chemo prior to surgery, do you agree? ?Thanks, ?Shelby ? ? ? ? ? ?

## 2021-11-08 NOTE — Telephone Encounter (Signed)
Patty, ?Apologies about the wrong date. ?Sent a separate message. ?Put in for June 5 at 7:30 AM. ?Thanks. ?GM ?

## 2021-11-08 NOTE — Telephone Encounter (Signed)
To confirm you want the pt put on for 5/29 Memorial day? ?

## 2021-11-08 NOTE — Telephone Encounter (Signed)
Dillon Merle, MD  Mansouraty, Telford Nab., MD; Milus Banister, MD; Dwan Bolt, MD; [name], RN; Timothy Lasso, RN ?Hi Dillon Smith and Dillon Smith,  ? ?Thank you so much for helping out. Since both of you are fully booked for next few weeks, I reached out to [facility] and he maybe able to do it in next 1-2 weeks. I will keep you posted when that appointment is confirmed so you can cancel yours.  ? ?Thanks  ? ?Dillon Smith   ?  ?   ? ?

## 2021-11-08 NOTE — Telephone Encounter (Signed)
SA,  ? ? I spoke with [place] the Oncology navigator at the [place]. Your staff will need to request a VA community referral for GI and the EUS.   ?

## 2021-11-09 ENCOUNTER — Ambulatory Visit: Payer: Medicare Other

## 2021-11-09 ENCOUNTER — Telehealth: Payer: Self-pay

## 2021-11-09 NOTE — Progress Notes (Signed)
OV note, imaging reports, demographics and insurance information faxed to Dr Ulyses Amor office at [place] [phone]. ? ?Request for VA referral to Dr Benson Norway, office note, and GI Conference recommendations faxed to [facility] at [phone]. ?

## 2021-11-09 NOTE — Telephone Encounter (Signed)
This nurse spoke with Ms. Dillon Smith and made aware of lab results and provider recommendations mentioned below.  Acknowledged understanding.  No further questions or concerns at this time.   ?

## 2021-11-09 NOTE — Telephone Encounter (Signed)
Patient with diagnosis of afib on Eliquis for anticoagulation.   ? ?Procedure: upper endoscopic ultrasound ?Date of procedure: 12/03/21 ? ?CHA2DS2-VASc Score = 6  ?This indicates a 9.7% annual risk of stroke. ?The patient's score is based upon: ?CHF History: 0 ?HTN History: 1 ?Diabetes History: 0 ?Stroke History: 2 ?Vascular Disease History: 1 ?Age Score: 2 ?Gender Score: 0 ?  ?CrCl 67m/min ?Platelet count 214K ? ?Per office protocol, patient can hold Eliquis for 1 day prior to procedure. He should resume as soon as safely possible after due to his elevated CV risk. ?

## 2021-11-09 NOTE — Telephone Encounter (Signed)
Left message for the pt to call back for tele pre op appt.  ?

## 2021-11-09 NOTE — Progress Notes (Signed)
I spoke to Bedelia Person, Mr [place] caregiver.  I let her know that we are referring Mr Hing to Dr Benson Norway at [place] for his EUS.  I informed her that they do not take [employer].  She asked about out of pocket expenses.  I told her to ask Dr Ulyses Amor office when they call with His appt information.  All questions were answered.  She verbalized understanding. ? ?

## 2021-11-09 NOTE — Telephone Encounter (Signed)
-----   Message from [name], NP sent at 11/04/2021  4:21 PM EDT ----- ?Please let pt know labs: mild anemia, no significant iron deficiency. CMP is stable.  ?Thanks, ?Lacie  ?

## 2021-11-09 NOTE — Telephone Encounter (Signed)
Supple, Megan E, RPH-CPP 3 hours ago (7:23 AM)  ? ?Patient with diagnosis of afib on Eliquis for anticoagulation.   ?  ?Procedure: upper endoscopic ultrasound ?Date of procedure: 12/03/21 ?  ?CHA2DS2-VASc Score = 6  ?This indicates a 9.7% annual risk of stroke. ?The patient's score is based upon: ?CHF History: 0 ?HTN History: 1 ?Diabetes History: 0 ?Stroke History: 2 ?Vascular Disease History: 1 ?Age Score: 2 ?Gender Score: 0 ?  ?CrCl 68m/min ?Platelet count 214K ?  ?Per office protocol, patient can hold Eliquis for 1 day prior to procedure. He should resume as soon as safely possible after due to his elevated CV risk.  ?  ? ?

## 2021-11-09 NOTE — Telephone Encounter (Signed)
Preoperative team, please contact this patient and set up a phone call appointment for further cardiac evaluation.  Thank you for your help. ? ?Jossie Ng. Shacora Zynda NP-C ? ?  ?11/09/2021, 9:26 AM ?[place] ?[address] ?Office [phone] Fax [phone] ? ?

## 2021-11-12 ENCOUNTER — Ambulatory Visit: Payer: Medicare Other | Admitting: Radiation Oncology

## 2021-11-12 ENCOUNTER — Encounter (HOSPITAL_COMMUNITY): Payer: Self-pay | Admitting: Gastroenterology

## 2021-11-12 NOTE — Telephone Encounter (Signed)
Dr Burr Medico will this pt keep the appt with our office or will he be seeing Dr Benson Norway? ?

## 2021-11-12 NOTE — Telephone Encounter (Signed)
Pt has appt 11/13/21 with Richardson Dopp, PAC . Will forward notes PAC for upcoming appt.  ?

## 2021-11-12 NOTE — Progress Notes (Signed)
? ? ?Office Visit  ?  ?Patient Name: Dillon Smith ?Date of Encounter: 11/13/2021 ? ?Primary Care Provider:  [name] ?Primary Cardiologist:  Lauree Chandler, MD ?Primary Electrophysiologist: Thompson Grayer, MD ? ?Chief Complaint  ?  ?LESSLIE Smith is a 88 y.o. male with PMH of CAD s/p CABG in 2007, HTN, CVA in 2021, persistent atrial fib, bradycardia, prostate cancer, hyperlipidemia, stage III CKD who presents today for preoperative clearance. ?Past Medical History  ?  ?Past Medical History:  ?Diagnosis Date  ? Bradycardia   ? CKD (chronic kidney disease), stage III ([place])   ? Coronary artery disease   ? CABG 2007  ? COVID-19 08/2019  ? Hyperlipidemia LDL goal <70   ? Hypertension   ? Persistent atrial fibrillation ([place])   ? Prostate cancer ([place]) 2010  ? PVC's (premature ventricular contractions)   ? Stroke (cerebrum) ([place])   ? ?Past Surgical History:  ?Procedure Laterality Date  ? COLONOSCOPY  2004  ? INSERTION PROSTATE RADIATION SEED  2010  ? stent    ? ?Allergies ? ?Allergies  ?Allergen Reactions  ? Penicillins Itching  ? ? ?History of Present Illness  ?  ?[place] hit CABG in 2007 at the [facility] in [place].  Initially seen at [facility] in 08/2019 and [facility] for stroke.  During work-up he was found to have atrial fibrillation and was started on Eliquis.  2D echo was performed 2021 with EF of 45-50%, global hypokinesis, mild to moderate mitral regurgitation.  GDMT is limited in titration due to bradycardia with beta-blockers and renal insufficiency with ACE/ARB. ? ?He was last seen for follow-up by Dr. Angelena Form on 08/2021 for follow-up.  During visit patient denied any anginal symptoms or shortness of breath.  His blood pressure was controlled and no changes were made to other medications and he was continued on hydralazine for GDMT. ? ?Mr. Dillon Smith was newly diagnosed with gastric cancer via imaging on 09/12/2021.  Staging CT and PET scans were negative for metastasis, he was  referred to Dr. [name] with [facility] surgery and was deemed to be candidate for tumor resection however he may require chemotherapy before procedure.  He presents today for preoperative clearance to undergo endoscopic ultrasound staging. ? ?Since last being seen in the office patient reports that he is doing well with no complaints of angina or shortness of breath. He presents today with his daughter for preoperative clearance.  He was seen by Dr. Angelena Form on 08/10/21 for annual follow-up and had no new complaints during visit.  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 He denies palpitations or rapid heart rate. patient denies chest pain, palpitations, dyspnea, PND, orthopnea, nausea, vomiting, dizziness, syncope, edema, weight gain, or early satiety. ? ?Home Medications  ?  ?Current Outpatient Medications  ?Medication Sig Dispense Refill  ? amLODipine (NORVASC) 10 MG tablet Take 10 mg by mouth daily.    ? apixaban (ELIQUIS) 2.5 MG TABS tablet Take 1 tablet (2.5 mg total) by mouth 2 (two) times daily. 180 tablet 1  ? atorvastatin (LIPITOR) 40 MG tablet Take 1 tablet (40 mg total) by mouth at bedtime. 30 tablet 2  ? Multiple Vitamin (MULTI VITAMIN DAILY PO) Take by mouth daily.    ? UNABLE TO FIND Med Name: supplement: tangy tangerine 2.0 tablets    ? capecitabine (XELODA) 500 MG tablet Take 2 tablets by mouth in the morning and 3 tablets by mouth in evening every  12 hours. Take within 30 minutes after meals. Take only on days of radiation, Monday through Friday. (Patient not taking: Reported on 11/13/2021) 75 tablet 1  ? ?No current facility-administered medications for this visit.  ?  ? ?Review of Systems  ?Please see the history of present illness.    ? ?All other systems reviewed and are otherwise negative except as noted above. ? ?Physical Exam  ?  ?Wt Readings from Last 3 Encounters:  ?11/13/21 197 lb 3.2 oz (89.4 kg)  ?10/29/21 194 lb 8 oz (88.2 kg)  ?10/17/21 195 lb 2  oz (88.5 kg)  ? ?VS: ?Vitals:  ? 11/13/21 1555  ?BP: (!) 148/56  ?Pulse: 62  ?SpO2: 96%  ?,Body mass index is 27.5 kg/m?. ? ?Constitutional:   ?   Appearance: Healthy appearance. Not in distress.  ?Neck:  ?   Vascular: JVD normal.  ?Pulmonary:  ?   Effort: Pulmonary effort is normal.  ?   Breath sounds: No wheezing. No rales. Diminished in the bases ?Cardiovascular:  ?   Normal rate. Regular rhythm. Normal S1. Normal S2.   ?   Murmurs: There is a systolic murmur ?Edema: ?   Peripheral edema absent.  ?Abdominal:  ?   Palpations: Abdomen is soft non tender. There is no hepatomegaly.  ?Skin: ?   General: Skin is warm and dry.  ?Neurological:  ?   General: No focal deficit present.  ?   Mental Status: Alert and oriented to person, place and time.  ?   Cranial Nerves: Cranial nerves are intact.  ?EKG/LABS/Other Studies Reviewed  ?  ?ECG personally reviewed by me today -atrial fibrillation with premature ventricular or aberrantly conducted complexes, nonspecific T wave abnormality-no acute changes ? ?Risk Assessment/Calculations:   ? ?CHA2DS2-VASc Score = 6  ? This indicates a 9.7% annual risk of stroke. ?The patient's score is based upon: ?CHF History: 0 ?HTN History: 1 ?Diabetes History: 0 ?Stroke History: 2 ?Vascular Disease History: 1 ?Age Score: 2 ?Gender Score: 0 ?  ? ?Lab Results  ?Component Value Date  ? WBC 5.5 10/29/2021  ? HGB 12.1 (L) 10/29/2021  ? HCT 37.1 (L) 10/29/2021  ? MCV 98.4 10/29/2021  ? PLT 214 10/29/2021  ? ?Lab Results  ?Component Value Date  ? CREATININE 1.40 (H) 10/29/2021  ? BUN 23 10/29/2021  ? NA 141 10/29/2021  ? K 4.4 10/29/2021  ? CL 108 10/29/2021  ? CO2 28 10/29/2021  ? ?Lab Results  ?Component Value Date  ? ALT 23 10/29/2021  ? AST 19 10/29/2021  ? ALKPHOS 84 10/29/2021  ? BILITOT 0.4 10/29/2021  ? ?Lab Results  ?Component Value Date  ? CHOL 125 02/06/2021  ? HDL 55 02/06/2021  ? [place] 60 02/06/2021  ? TRIG 41 02/06/2021  ? CHOLHDL 2.3 02/06/2021  ?  ?Lab Results  ?Component Value  Date  ? HGBA1C 6.3 (H) 08/16/2019  ? ? ?Assessment & Plan  ?  ?1.  Preoperative clearance: ?-Patient presents today for preoperative clearance for endoscopic procedure. ?-Mr. Accardo's perioperative risk of a major cardiac event is 11% according to the Revised Cardiac Risk Index (RCRI). Therefore, he is at high risk for perioperative complications.   His functional capacity is good at 5.29 METs according to the Duke Activity Status Index (DASI). ?Recommendations: ?According to ACC/AHA guidelines, no further cardiovascular testing needed.  The patient may proceed to surgery at acceptable risk.   ? ?Eliquis (Apixaban) can be held for 1 days prior to surgery.  Please  resume post op when felt to be safe.   ?  ?2.  Coronary artery disease: ?-S/p CABG and 2007 and [facility] in [place]  ?-Patient currently denies any complaints of angina or shortness of breath ?-Continue Lipitor 40 mg daily ?-Patient not on beta-blocker due to baseline bradycardia and aspirin contraindicated with Eliquis ? ?3.  Persistent atrial fibrillation: ?-EKG today reveals atrial fibrillation with rate of 62 ?-He reports no complaints of bleeding ?-Continue Eliquis 5 mg twice daily ? ?4.  Essential hypertension: ?-Blood pressure today was 148/56 ?-Norvasc 10 mg daily ?-We discussed the importance of reducing salt in his diet to reduce blood pressure ? ?5.  Chronic kidney disease stage IIIb: ?-[facility] [place] ?-Most recent creatinine was 1.29 on 09/23/21 ? ?  Disposition: Follow-up with Lauree Chandler, MD or APP in 12 months ?   ?Medication Adjustments/Labs and Tests Ordered: ?Current medicines are reviewed at length with the patient today.  Concerns regarding medicines are outlined above.  ? ?Signed, ?Mable Fill, Marissa Nestle, NP ?11/13/2021, 4:39 PM ?[place] ?

## 2021-11-13 ENCOUNTER — Ambulatory Visit (INDEPENDENT_AMBULATORY_CARE_PROVIDER_SITE_OTHER): Payer: Medicare Other | Admitting: Nurse Practitioner

## 2021-11-13 ENCOUNTER — Ambulatory Visit: Payer: Medicare Other | Admitting: Physician Assistant

## 2021-11-13 ENCOUNTER — Ambulatory Visit: Payer: Medicare Other

## 2021-11-13 ENCOUNTER — Other Ambulatory Visit: Payer: Medicare Other

## 2021-11-13 ENCOUNTER — Encounter: Payer: Self-pay | Admitting: Nurse Practitioner

## 2021-11-13 VITALS — BP 148/56 | HR 62 | Ht 71.0 in | Wt 197.2 lb

## 2021-11-13 DIAGNOSIS — I2510 Atherosclerotic heart disease of native coronary artery without angina pectoris: Secondary | ICD-10-CM | POA: Diagnosis not present

## 2021-11-13 DIAGNOSIS — Z01810 Encounter for preprocedural cardiovascular examination: Secondary | ICD-10-CM

## 2021-11-13 DIAGNOSIS — I10 Essential (primary) hypertension: Secondary | ICD-10-CM

## 2021-11-13 DIAGNOSIS — I4819 Other persistent atrial fibrillation: Secondary | ICD-10-CM | POA: Diagnosis not present

## 2021-11-13 DIAGNOSIS — I255 Ischemic cardiomyopathy: Secondary | ICD-10-CM

## 2021-11-13 DIAGNOSIS — N1832 Chronic kidney disease, stage 3b: Secondary | ICD-10-CM

## 2021-11-13 NOTE — Patient Instructions (Signed)
Medication Instructions:  ?Your physician recommends that you continue on your current medications as directed. Please refer to the Current Medication list given to you today. ? ?*If you need a refill on your cardiac medications before your next appointment, please call your pharmacy* ? ? ?Lab Work: ?None ordered ? ?If you have labs (blood work) drawn today and your tests are completely normal, you will receive your results only by: ?MyChart Message (if you have MyChart) OR ?A paper copy in the mail ?If you have any lab test that is abnormal or we need to change your treatment, we will call you to review the results. ? ? ?Testing/Procedures: ?None ordered ? ? ?Follow-Up: ?At [facility], you and your health needs are our priority.  As part of our continuing mission to provide you with exceptional heart care, we have created designated Provider Care Teams.  These Care Teams include your primary Cardiologist (physician) and Advanced Practice Providers (APPs -  Physician Assistants and Nurse Practitioners) who all work together to provide you with the care you need, when you need it. ? ?We recommend signing up for the patient portal called "MyChart".  Sign up information is provided on this After Visit Summary.  MyChart is used to connect with patients for Virtual Visits (Telemedicine).  Patients are able to view lab/test results, encounter notes, upcoming appointments, etc.  Non-urgent messages can be sent to your provider as well.   ?To learn more about what you can do with MyChart, go to [email or web address].   ? ?Your next appointment:   ?9 month(s) ? ?The format for your next appointment:   ?In Person ? ?Provider:   ?Lauree Chandler, MD   ? ? ?Other Instructions ? ? ?Important Information About Sugar ? ? ? ? ? ? ?

## 2021-11-14 ENCOUNTER — Other Ambulatory Visit: Payer: Self-pay | Admitting: Gastroenterology

## 2021-11-14 ENCOUNTER — Telehealth: Payer: Self-pay | Admitting: *Deleted

## 2021-11-14 ENCOUNTER — Ambulatory Visit: Payer: Medicare Other

## 2021-11-14 ENCOUNTER — Other Ambulatory Visit: Payer: Self-pay | Admitting: Hematology

## 2021-11-14 NOTE — Progress Notes (Signed)
START ON PATHWAY REGIMEN - Gastroesophageal ? ? ?  A cycle is every 14 days: ?    Oxaliplatin  ?    Leucovorin  ?    Fluorouracil  ?    Fluorouracil  ? ?**Always confirm dose/schedule in your pharmacy ordering system** ? ?Patient Characteristics: ?Gastric, Adenocarcinoma, Preoperative or Nonsurgical Candidate, M0 (Clinical Staging), cT3 or Higher or cN+, Surgical Candidate (Up to cT4a), MSS/pMMR or MSI Unknown ?Therapeutic Status: Preoperative or Nonsurgical Candidate, M0 (Clinical Staging) ?Histology: Adenocarcinoma ?Disease Classification: Gastric ?AJCC N Category: cN0 ?AJCC M Category: cM0 ?AJCC 8 Stage Grouping: Unknown ?AJCC T Category: cTX ?Microsatellite/Mismatch Repair Status: Unknown ?Intent of Therapy: ?Curative Intent, Discussed with Patient ?

## 2021-11-14 NOTE — Telephone Encounter (Signed)
? ?  Pre-operative Risk Assessment  ?  ?Patient Name: Dillon Smith  ?DOB: 11/29/1929 ?MRN: [id number]  ? ? ?Request for Surgical Clearance    ? ?Procedure:   ENDOSCOPIC ULTRASOUND ? ?Date of Surgery:  Clearance 11/22/21                              ?   ?Surgeon:  DR. HUNG ?Surgeon's Group or Practice Name:  [facility] ?Phone number:  [phone] ?Fax number:  [phone] ?  ?Type of Clearance Requested:   ?- Medical  ?- Pharmacy:  Hold Apixaban (Eliquis)   ?  ?Type of Anesthesia:   PROPOFOL ?  ?Additional requests/questions:   ? ?Signed, ?Julaine Hua   ?11/14/2021, 5:06 PM  ? ?

## 2021-11-14 NOTE — Progress Notes (Signed)
Attempted to obtain medical history via telephone, unable to reach at this time. HIPAA compliant voicemail message left requesting return call to pre surgical testing department. 

## 2021-11-15 ENCOUNTER — Ambulatory Visit: Payer: Medicare Other

## 2021-11-15 NOTE — Telephone Encounter (Signed)
   Patient Name: Dillon Smith  DOB: 11/29/1929 MRN: [id number]  Primary Cardiologist: Lauree Chandler, MD  Chart reviewed as part of pre-operative protocol coverage. Patient recently saw Ambrose Pancoast NP for clearance. His note also references instructions for holding Eliquis (which was provided from pharmacy team from a prior phone note).  Will route this bundled recommendation to requesting provider via Epic fax function. Please call with questions.  Charlie Pitter, PA-C 11/15/2021, 4:22 PM

## 2021-11-15 NOTE — Progress Notes (Signed)
I spoke with Mr Dillon Smith daughter, Dillon Smith.  I reviewed Dr Ernestina Penna proposed treatment plan and appts.  Dillon Smith states she will come to the ov and chemo ed class with her father on 5/26.  I have sent a scheduling message to [facility], for OV with [name], lab, and chemo ed class after on 5/26 starting at 0810.  I briefly explained FOLFOX, the need for a port a cath, and routine f/u.  I gave jean my direct phone number should she have any questions or concerns.  All questions were answered.  She verbalized understanding.

## 2021-11-15 NOTE — Progress Notes (Signed)
I spoke with Dillon Smith and reviewed his upcoming appts at [facility].  He verbalized understanding.

## 2021-11-15 NOTE — Telephone Encounter (Signed)
The pt has been scheduled with Dr Benson Norway for 5/25.  I will cancel the case with GM.

## 2021-11-16 ENCOUNTER — Ambulatory Visit: Payer: Medicare Other

## 2021-11-16 DIAGNOSIS — C163 Malignant neoplasm of pyloric antrum: Secondary | ICD-10-CM

## 2021-11-19 ENCOUNTER — Ambulatory Visit: Payer: Medicare Other

## 2021-11-20 ENCOUNTER — Ambulatory Visit: Payer: Medicare Other | Admitting: Hematology

## 2021-11-20 ENCOUNTER — Other Ambulatory Visit: Payer: Medicare Other

## 2021-11-20 ENCOUNTER — Ambulatory Visit: Payer: Medicare Other

## 2021-11-21 ENCOUNTER — Ambulatory Visit: Payer: Medicare Other

## 2021-11-22 ENCOUNTER — Ambulatory Visit: Payer: Medicare Other

## 2021-11-22 ENCOUNTER — Ambulatory Visit (HOSPITAL_COMMUNITY): Payer: Medicare Other | Admitting: Certified Registered"

## 2021-11-22 ENCOUNTER — Encounter (HOSPITAL_COMMUNITY): Payer: Self-pay

## 2021-11-22 ENCOUNTER — Encounter (HOSPITAL_COMMUNITY): Payer: Self-pay | Admitting: Gastroenterology

## 2021-11-22 ENCOUNTER — Ambulatory Visit (HOSPITAL_COMMUNITY): Admit: 2021-11-22 | Payer: Medicare Other | Admitting: Gastroenterology

## 2021-11-22 ENCOUNTER — Ambulatory Visit (HOSPITAL_COMMUNITY)
Admission: RE | Admit: 2021-11-22 | Discharge: 2021-11-22 | Disposition: A | Discharge status: Home / Self Care | Payer: Medicare Other | Attending: Gastroenterology | Admitting: Gastroenterology

## 2021-11-22 ENCOUNTER — Ambulatory Visit (HOSPITAL_BASED_OUTPATIENT_CLINIC_OR_DEPARTMENT_OTHER): Payer: Medicare Other | Admitting: Certified Registered"

## 2021-11-22 ENCOUNTER — Encounter (HOSPITAL_COMMUNITY)
Admission: RE | Disposition: A | Discharge status: Home / Self Care | Payer: Self-pay | Source: Home / Self Care | Attending: Gastroenterology

## 2021-11-22 DIAGNOSIS — I10 Essential (primary) hypertension: Secondary | ICD-10-CM

## 2021-11-22 DIAGNOSIS — C164 Malignant neoplasm of pylorus: Secondary | ICD-10-CM | POA: Insufficient documentation

## 2021-11-22 DIAGNOSIS — Z87891 Personal history of nicotine dependence: Secondary | ICD-10-CM

## 2021-11-22 DIAGNOSIS — I129 Hypertensive chronic kidney disease with stage 1 through stage 4 chronic kidney disease, or unspecified chronic kidney disease: Secondary | ICD-10-CM | POA: Diagnosis not present

## 2021-11-22 DIAGNOSIS — N183 Chronic kidney disease, stage 3 unspecified: Secondary | ICD-10-CM | POA: Diagnosis not present

## 2021-11-22 DIAGNOSIS — I2510 Atherosclerotic heart disease of native coronary artery without angina pectoris: Secondary | ICD-10-CM | POA: Diagnosis not present

## 2021-11-22 DIAGNOSIS — E278 Other specified disorders of adrenal gland: Secondary | ICD-10-CM

## 2021-11-22 DIAGNOSIS — C169 Malignant neoplasm of stomach, unspecified: Secondary | ICD-10-CM | POA: Diagnosis present

## 2021-11-22 DIAGNOSIS — Z951 Presence of aortocoronary bypass graft: Secondary | ICD-10-CM | POA: Diagnosis not present

## 2021-11-22 HISTORY — PX: ESOPHAGOGASTRODUODENOSCOPY (EGD) WITH PROPOFOL: SHX5813

## 2021-11-22 HISTORY — PX: EUS: SHX5427

## 2021-11-22 SURGERY — UPPER ESOPHAGEAL ENDOSCOPIC ULTRASOUND (EUS)
Anesthesia: Monitor Anesthesia Care

## 2021-11-22 SURGERY — UPPER ENDOSCOPIC ULTRASOUND (EUS) RADIAL
Anesthesia: Monitor Anesthesia Care

## 2021-11-22 MED ORDER — PROPOFOL 500 MG/50ML IV EMUL
INTRAVENOUS | Status: DC | PRN
  Administered 2021-11-22: 125 ug/kg/min via INTRAVENOUS

## 2021-11-22 MED ORDER — PROPOFOL 10 MG/ML IV BOLUS
INTRAVENOUS | Status: DC | PRN
  Administered 2021-11-22 (×3): 10 mg via INTRAVENOUS

## 2021-11-22 MED ORDER — PROPOFOL 1000 MG/100ML IV EMUL
INTRAVENOUS | Status: AC
Start: 2021-11-22 — End: ?
  Filled 2021-11-22: qty 100

## 2021-11-22 MED ORDER — LACTATED RINGERS IV SOLN: INTRAVENOUS | Status: DC | PRN

## 2021-11-22 MED ORDER — LIDOCAINE 2% (20 MG/ML) 5 ML SYRINGE
INTRAMUSCULAR | Status: DC | PRN
  Administered 2021-11-22: 100 mg via INTRAVENOUS

## 2021-11-22 NOTE — Discharge Instructions (Signed)

## 2021-11-22 NOTE — Anesthesia Postprocedure Evaluation (Signed)
Anesthesia Post Note  Patient: Dillon Smith  Procedure(s) Performed: UPPER ENDOSCOPIC ULTRASOUND (EUS) RADIAL     Patient location during evaluation: PACU Anesthesia Type: MAC Level of consciousness: awake and alert Pain management: pain level controlled Vital Signs Assessment: post-procedure vital signs reviewed and stable Respiratory status: spontaneous breathing, nonlabored ventilation, respiratory function stable and patient connected to nasal cannula oxygen Cardiovascular status: stable and blood pressure returned to baseline Postop Assessment: no apparent nausea or vomiting Anesthetic complications: no   No notable events documented.  Last Vitals:  Vitals:   11/22/21 1127 11/22/21 1130  BP: (!) 114/55 (!) 133/44  Pulse: 83 (!) 41  Resp: 16 15  Temp: (!) 36.2 C 36.8 C  SpO2: 100% 100%    Last Pain:  Vitals:   11/22/21 1130  TempSrc: Temporal  PainSc: 0-No pain                 Naziah Portee S

## 2021-11-22 NOTE — Progress Notes (Signed)
[place]   [phone] Fax:([phone]) [phone]   Clinic Follow up Note   Patient Care Team: [facility], [name] as PCP - General [name], MD as PCP - Cardiology (Cardiology) Thompson Grayer, MD as PCP - Electrophysiology (Cardiology) [name], MD as Consulting Physician (Radiation Oncology) [name], RN as Registered Nurse [name], MD as Consulting Physician (Hematology) [name], NP as Nurse Practitioner (Nurse Practitioner)  Date of Service:  11/23/2021  CHIEF COMPLAINT: f/u of gastric cancer  CURRENT THERAPY:  Pending neoadjuvant FOLFOX every 2 weeks  ASSESSMENT & PLAN:  Dillon Smith is a 87 y.o. male with   1. Adenocarcinoma of the pylorus/distal stomach, cT3N0M0, stage IIB, MMR normal, PD-L1 CPS 2% -presented with reported 30 pound weight loss over a year, and 2-week history of GERD, constipation, and regurgitation. CT AP w/o contrast at the [facility] on 09/12/21 showed pyloric thickening. Biopsy from pyloric channel obtained during EGD on 09/14/21 confirmed adenocarcinoma. Baseline CEA and CA19.9 normal  -Due to gastric outlet obstruction she underwent EGD/EUS with pylori stent placement on 09/21/21 and his symptoms completely resolved  -He was referred from [place] to [name] for palliative radiation.  Staging CT 09/21/21 and PET scan 10/18/21 were negative for lymphadenopathy or distant metastasis -He underwent EUS staging yesterday, which showed a T3 N0 disease  -She has met general surgeon Dr. Zenia Smith, who thinks he is a candidate for surgical resection. -I discussed the aggressive nature of gastric cancer, and high risk of recurrence after surgery.  I recommend neoadjuvant chemotherapy based on NCCN guideline. -Due to his age and medical comorbidities, he is not a candidate for intensive chemo FLOT4, I recommend FOLFOX for 3 to 4 months if he tolerates well before surgery. --Chemotherapy consent: Side effects including but does not  not limited to, fatigue, nausea, vomiting, diarrhea, hair loss, neuropathy, fluid retention, renal and kidney dysfunction, cold sensitivity and neuropathy, neutropenic fever, needed for blood transfusion, bleeding, were discussed with patient in great detail. He agrees to proceed. -The goal of therapy is curative. -Due to his advanced age and medical comorbidities, I will reduce his chemo dose by 10-15%.  -If he does have significant side effect from chemo, I will stop chemo early and let him proceed with surgery. -His MMR was normal, he is not a candidate for immunotherapy upfront, but will consider adjuvant nivolumab if he has residual disease after neoadjuvant therapy.    2. GERD, constipation, regurgitation, gastric outlet obstruction, weight loss -Secondary to #1 -much improved with gastrojejunostomy -He eats a normal diet, encouraged him to try to gain weight prior to starting treatment -Follow-up with dietitian   3. Comorbidities: HTN, HL, A-fib, history of stroke, prostate cancer 2004 s/p brachytherapy, CKD stage III  -Currently on amlodipine, apixaban, and atorvastatin.  Denies bleeding on anticoagulation  -He has no residual deficits from previous stroke -His chronic conditions appear to be well controlled -Last PSA was normal per patient, checked at the [place] -He has CKD and mild anemia, will monitor closely on chemo.   4. Social -Patient is single with 2 adult daughters who live out of state.  He is a high functioning gentleman who lives alone and is independent with ADLs including driving -His neighbor/friend checks on him and helps with medication -His niece came in with him today   5. Goals of care -he is full code     PLAN: -port placement 11/28/21 -lab today and first FOLFOX on 11/29/21 as scheduled -  chemo class today -I called in Compazine, Zofran and Emla cream -Dillon Smith will call him after chemo for toxicity checkup -Follow-up with me before cycle 2   No  problem-specific Assessment & Plan notes found for this encounter.   SUMMARY OF ONCOLOGIC HISTORY: Oncology History  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[place])  09/14/2021 Initial Biopsy   A. pyloric channel biopsy -Adenocarcinoma involving superficial gastric mucosa Entheses the biopsy is superficial and the depth of invasion cannot be accurately determined) -Reactive gastropathy and benign duodenal mucosa  B. gastric biopsy -Chronic active gastritis -Immunostain for H. pylori is negative  NGS, MMR, PD-L1 are pending   09/14/2021 Initial Diagnosis   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[place])   09/14/2021 Cancer Staging   Staging form: Stomach, AJCC 8th Edition - Clinical stage from 09/14/2021: Stage IIB (cT3, cN0, cM0) - Signed by [name], MD on 11/23/2021 Stage prefix: Initial diagnosis Total positive nodes: 0    09/16/2021 Imaging   CT AP w contrast  1.  Findings in keeping with gastric outlet obstruction from circumferential gastric antral mass lesion. Recommend correlation with EGD and biopsy.  2.  Indeterminate complex enhancing left adrenal mass. The degree of heterogeneity and enhancement would be atypical for adenoma. Metastatic disease or pheochromocytoma are therefore considerations.  3.  Bilobed infrarenal abdominal aortic aneurysm measuring up to 3.4 cm with right common iliac artery aneurysm.  4.  Small left pleural effusion.   09/21/2021 Procedure   EUS/EGD Findings  EGD: The esophagus was normal. The gastric body and antrum appeared  normal. Evidence of tumor was seen at the pylorus, and extending into the  pyloric channel and the duodenal bulb resulting in severe luminal  narrowing.   EUS was then performed. FNA was performed.   Impression  Successful EUS-guided gastrojejunostomy using a lumen apposing metal stent    10/18/2021 PET scan   IMPRESSION: 1. The known adenocarcinoma involving the distal stomach is hypermetabolic. No definite nodal or hepatic metastases identified. 2.  No evidence of distant metastatic disease. 3. Indeterminate left adrenal nodule is unchanged from outside CT and demonstrates no hypermetabolic activity. This measures higher in density than a typical adenoma. Attention on follow-up recommended. 4. Decompressed stomach following gastrojejunostomy. 5. 3.8 cm suprarenal abdominal aortic aneurysm with smaller infrarenal component. Recommend follow-up ultrasound every 2 years. This recommendation follows ACR consensus guidelines: White Paper of the ACR Incidental Findings Committee II on Vascular Findings. J Am Coll Radiol 2013; 10:789-794. Aortic Atherosclerosis (ICD10-I70.0).   11/19/2021 -  Chemotherapy   Patient is on Treatment Plan : GASTROESOPHAGEAL FOLFOX q14d x 6 cycles         INTERVAL HISTORY: Dillon Smith is here for a follow up of gastric cancer. He was last seen by me on 5/1. He presents to the clinic accompanied by his niece.  He is clinically doing well, denies any abdominal discomfort, nausea, or other complaints.  He is eating well with good energy level.  No other new complaints.  Weight has been stable.   All other systems were reviewed with the patient and are negative.  MEDICAL HISTORY:  Past Medical History:  Diagnosis Date   Bradycardia    CKD (chronic kidney disease), stage III ([place])    Coronary artery disease    CABG 2007   COVID-19 08/2019   Hyperlipidemia LDL goal <70    Hypertension    Persistent atrial fibrillation ([place])    Prostate cancer ([place]) 2010   PVC's (premature ventricular contractions)  Stroke (cerebrum) [facility])     SURGICAL HISTORY: Past Surgical History:  Procedure Laterality Date   COLONOSCOPY  2004   ESOPHAGOGASTRODUODENOSCOPY (EGD) WITH PROPOFOL N/A 11/22/2021   Procedure: ESOPHAGOGASTRODUODENOSCOPY (EGD) WITH PROPOFOL;  Surgeon: Carol Ada, MD;  Location: [facility] ENDOSCOPY;  Service: Gastroenterology;  Laterality: N/A;   EUS N/A 11/22/2021   Procedure: UPPER ENDOSCOPIC ULTRASOUND (EUS)  RADIAL;  Surgeon: Carol Ada, MD;  Location: [facility] ENDOSCOPY;  Service: Gastroenterology;  Laterality: N/A;   INSERTION PROSTATE RADIATION SEED  2010   stent      I have reviewed the social history and family history with the patient and they are unchanged from previous note.  ALLERGIES:  is allergic to penicillins.  MEDICATIONS:  Current Outpatient Medications  Medication Sig Dispense Refill   amLODipine (NORVASC) 10 MG tablet Take 10 mg by mouth daily.     apixaban (ELIQUIS) 5 MG TABS tablet Take 2.5 mg by mouth 2 (two) times daily.     atorvastatin (LIPITOR) 40 MG tablet Take 1 tablet (40 mg total) by mouth at bedtime. 30 tablet 2   lidocaine-prilocaine (EMLA) cream Apply to affected area once 30 g 3   Multiple Vitamin (MULTI VITAMIN DAILY PO) Take 1 tablet by mouth daily.     ondansetron (ZOFRAN) 8 MG tablet Take 1 tablet (8 mg total) by mouth 2 (two) times daily as needed for refractory nausea / vomiting. Start on day 3 after chemotherapy. 30 tablet 1   prochlorperazine (COMPAZINE) 10 MG tablet Take 1 tablet (10 mg total) by mouth every 6 (six) hours as needed (Nausea or vomiting). 30 tablet 1   No current facility-administered medications for this visit.    PHYSICAL EXAMINATION: ECOG PERFORMANCE STATUS: 0 - Asymptomatic  Vitals:   11/23/21 0819  BP: (!) 153/63  Pulse: 79  Resp: 18  Temp: 98 F (36.7 C)  SpO2: 97%   Wt Readings from Last 3 Encounters:  11/23/21 195 lb 6.4 oz (88.6 kg)  11/22/21 197 lb 1.5 oz (89.4 kg)  11/13/21 197 lb 3.2 oz (89.4 kg)     GENERAL:alert, no distress and comfortable SKIN: skin color, texture, turgor are normal, no rashes or significant lesions EYES: normal, Conjunctiva are pink and non-injected, sclera clear NECK: supple, thyroid normal size, non-tender, without nodularity Musculoskeletal:no cyanosis of digits and no clubbing  NEURO: alert & oriented x 3 with fluent speech, no focal motor/sensory deficits  LABORATORY DATA:  I have  reviewed the data as listed    Latest Ref Rng & Units 11/23/2021    9:05 AM 10/29/2021   12:13 PM 02/06/2021    8:45 AM  CBC  WBC 4.0 - 10.5 K/uL 4.6   5.5   5.7    Hemoglobin 13.0 - 17.0 g/dL 12.6   12.1   13.1    Hematocrit 39.0 - 52.0 % 38.3   37.1   40.1    Platelets 150 - 400 K/uL 187   214   184          Latest Ref Rng & Units 11/23/2021    9:05 AM 10/29/2021   12:13 PM 02/06/2021    8:45 AM  CMP  Glucose 70 - 99 mg/dL 94   106   94    BUN 8 - 23 mg/dL _0 Creatinine 0.61 - 1.24 mg/dL 1.46   1.40   1.77    Sodium 135 - 145 mmol/L 140   141  143    Potassium 3.5 - 5.1 mmol/L 4.0   4.4   4.6    Chloride 98 - 111 mmol/L 106   108   106    CO2 22 - 32 mmol/L _0 Calcium 8.9 - 10.3 mg/dL 9.7   9.5   9.7    Total Protein 6.5 - 8.1 g/dL 7.1   7.1   6.4    Total Bilirubin 0.3 - 1.2 mg/dL 0.4   0.4   0.5    Alkaline Phos 38 - 126 U/L 96   84   84    AST 15 - 41 U/L _1 ALT 0 - 44 U/L _2 RADIOGRAPHIC STUDIES: I have personally reviewed the radiological images as listed and agreed with the findings in the report. No results found.    No orders of the defined types were placed in this encounter.  All questions were answered. The patient knows to call the clinic with any problems, questions or concerns. No barriers to learning was detected. The total time spent in the appointment was 40 minutes.     [name], MD 11/23/2021   I, Wilburn Mylar, am acting as scribe for [name], MD.   I have reviewed the above documentation for accuracy and completeness, and I agree with the above.

## 2021-11-22 NOTE — Anesthesia Procedure Notes (Signed)
Procedure Name: MAC Date/Time: 11/22/2021 11:00 AM Performed by: Eben Burow, CRNA Pre-anesthesia Checklist: Patient identified, Emergency Drugs available, Suction available, Patient being monitored and Timeout performed Oxygen Delivery Method: Simple face mask Placement Confirmation: positive ETCO2

## 2021-11-22 NOTE — H&P (Signed)
Dillon Smith HPI: The patient is here for staging of his gastric cancer.  Past Medical History:  Diagnosis Date   Bradycardia    CKD (chronic kidney disease), stage III ([place])    Coronary artery disease    CABG 2007   COVID-19 08/2019   Hyperlipidemia LDL goal <70    Hypertension    Persistent atrial fibrillation ([place])    Prostate cancer ([place]) 2010   PVC's (premature ventricular contractions)    Stroke (cerebrum) ([place])     Past Surgical History:  Procedure Laterality Date   COLONOSCOPY  2004   INSERTION PROSTATE RADIATION SEED  2010   stent      Family History  Problem Relation Age of Onset   Cancer Neg Hx     Social History:  reports that he has quit smoking. He has never used smokeless tobacco. He reports that he does not currently use alcohol. He reports that he does not currently use drugs.  Allergies:  Allergies  Allergen Reactions   Penicillins Itching    Medications: Scheduled: Continuous:  No results found for this or any previous visit (from the past 24 hour(s)).   No results found.  ROS:  As stated above in the HPI otherwise negative.  Blood pressure (!) 181/79, pulse 94, temperature (!) 97.3 F (36.3 C), temperature source Temporal, resp. rate (!) 22, height '5\' 11"'$  (1.803 m), weight 89.4 kg, SpO2 99 %.    PE: Gen: NAD, Alert and Oriented HEENT:  [place]/AT, EOMI Neck: Supple, no LAD Lungs: CTA Bilaterally CV: RRR without M/G/R ABD: Soft, NTND, +BS Ext: No C/C/E  Assessment/Plan: 1) Gastric cancer - EUS.  Chanique Duca D 11/22/2021, 10:47 AM

## 2021-11-22 NOTE — Op Note (Signed)
[facility] Patient Name: Dillon Smith Procedure Date: 11/22/2021 MRN: [id number] Attending MD: Carol Ada , MD Date of Birth: 03/27/1930 CSN: [id number] Age: 88 Admit Type: Outpatient Procedure:                Upper EUS Indications:              Staging of gastric adenocarcinoma Providers:                Carol Ada, MD, Lucien Mons,                            Technician, Jefm Miles CRNA Referring MD:             Carol Ada, MD Medicines:                Propofol per Anesthesia Complications:            No immediate complications. Estimated Blood Loss:     Estimated blood loss: none. Procedure:                Pre-Anesthesia Assessment:                           - Prior to the procedure, a History and Physical                            was performed, and patient medications and                            allergies were reviewed. The patient's tolerance of                            previous anesthesia was also reviewed. The risks                            and benefits of the procedure and the sedation                            options and risks were discussed with the patient.                            All questions were answered, and informed consent                            was obtained. Prior Anticoagulants: The patient has                            taken no previous anticoagulant or antiplatelet                            agents. ASA Grade Assessment: III - A patient with                            severe systemic disease. After reviewing the risks  and benefits, the patient was deemed in                            satisfactory condition to undergo the procedure.                           - Sedation was administered by an anesthesia                            professional. Deep sedation was attained.                           After obtaining informed consent, the endoscope was                             passed under direct vision. Throughout the                            procedure, the patient's blood pressure, pulse, and                            oxygen saturations were monitored continuously. The                            [id number] ([id number] Olympus linear ultrasound scope                            was introduced through the mouth, and advanced to                            the prepyloric region, stomach. The upper EUS was                            accomplished without difficulty. The patient                            tolerated the procedure well. Scope In: Scope Out: Findings:      ENDOSONOGRAPHIC FINDING: :      A hypoechoic irregular mass was identified endosonographically in the       prepyloric region of the stomach and in the pylorus. The mass measured       45 mm by 30 mm in maximal cross-sectional diameter. The outer margins       were irregular. There was sonographic evidence suggesting invasion into       the serosa (Layer 5). An intact interface was seen between the mass and       the adjacent structures suggesting a lack of invasion.      A round mass was identified endosonographically in the left adrenal       gland. The mass was hypoechoic. The mass measured 27 mm by 22 mm in       maximal cross-sectional diameter. The endosonographic borders were       well-defined. An intact interface was seen between the mass and the       adjacent structures suggesting a lack of invasion.      There  was no sign of significant endosonographic abnormality in the left       lobe of the liver.      In the body of the stomach there was evidence of a gastrojejunostomy,       which was patent. In the prepyloric and pyloric region a large       ulcerated, nonbleeding, obstructive mass was identified. This lesion       measured 45 mm x 30 mm and there was evidence of pseudopodia, serosal       invasion, without invasion into any adjacent structures. The gallbladder       was adjacent  to this mass and there was a clear demarcation. Because of       the obstruction, the echoendoscope was not able to traverse this area.       Evaluation was only possible through the prepyloric/antral region. There       was no evidence of any peritumoral lymph nodes and no lymphadenopthy in       the Celiac axis. Two benign appearing subcarinal lymph nodes were       identified. The left adrenal mass was noted and it measured 27 mm x 22       mm. The liver was without any evidence of mets or biliary ductal       dilation. The PD was normal and there was evidence of lobularity in the       tail of the pancreas. No significant pancreatic atrophy was identified. Impression:               - A mass was found in the prepyloric region of the                            stomach and in the pylorus. A tissue diagnosis was                            obtained prior to this exam. This is of                            adenocarcinoma. This was staged T3 N0 Mx by                            endosonographic criteria.                           - A mass measuring 27 mm by 22 mm was identified                            endosonographically in the left adrenal gland.                           - There was no evidence of significant pathology in                            the left lobe of the liver.                           - No specimens collected. Moderate Sedation:      Not Applicable -  Patient had care per Anesthesia. Recommendation:           - Patient has a contact number available for                            emergencies. The signs and symptoms of potential                            delayed complications were discussed with the                            patient. Return to normal activities tomorrow.                            Written discharge instructions were provided to the                            patient.                           - Resume previous diet. Procedure Code(s):        ---  Professional ---                           [phone], Esophagogastroduodenoscopy, flexible,                            transoral; with endoscopic ultrasound examination                            limited to the esophagus, stomach or duodenum, and                            adjacent structures Diagnosis Code(s):        --- Professional ---                           C16.4, Malignant neoplasm of pylorus                           E27.8, Other specified disorders of adrenal gland CPT copyright 2019 American Medical Association. All rights reserved. The codes documented in this report are preliminary and upon coder review may  be revised to meet current compliance requirements. Carol Ada, MD Carol Ada, MD 11/22/2021 11:39:03 AM This report has been signed electronically. Number of Addenda: 0

## 2021-11-22 NOTE — Anesthesia Preprocedure Evaluation (Signed)
Anesthesia Evaluation  Patient identified by MRN, date of birth, ID band Patient awake    Reviewed: Allergy & Precautions, NPO status , Patient's Chart, lab work & pertinent test results  Airway Mallampati: II  TM Distance: >3 FB Neck ROM: Full    Dental no notable dental hx.    Pulmonary neg pulmonary ROS, former smoker,    breath sounds clear to auscultation + decreased breath sounds      Cardiovascular hypertension, Pt. on medications + CAD and + CABG  Normal cardiovascular exam Rhythm:Regular Rate:Normal     Neuro/Psych CVA negative psych ROS   GI/Hepatic negative GI ROS, Neg liver ROS,   Endo/Other  negative endocrine ROS  Renal/GU Renal InsufficiencyRenal disease  negative genitourinary   Musculoskeletal negative musculoskeletal ROS (+)   Abdominal   Peds negative pediatric ROS (+)  Hematology negative hematology ROS (+)   Anesthesia Other Findings   Reproductive/Obstetrics negative OB ROS                             Anesthesia Physical Anesthesia Plan  ASA: 3  Anesthesia Plan: MAC   Post-op Pain Management: Minimal or no pain anticipated   Induction: Intravenous  PONV Risk Score and Plan: 1 and Propofol infusion and Treatment may vary due to age or medical condition  Airway Management Planned: Simple Face Mask  Additional Equipment:   Intra-op Plan:   Post-operative Plan:   Informed Consent: I have reviewed the patients History and Physical, chart, labs and discussed the procedure including the risks, benefits and alternatives for the proposed anesthesia with the patient or authorized representative who has indicated his/her understanding and acceptance.     Dental advisory given  Plan Discussed with: CRNA and Surgeon  Anesthesia Plan Comments:         Anesthesia Quick Evaluation

## 2021-11-22 NOTE — Transfer of Care (Signed)
Immediate Anesthesia Transfer of Care Note  Patient: Dillon Smith  Procedure(s) Performed: UPPER ENDOSCOPIC ULTRASOUND (EUS) RADIAL  Patient Location: PACU and Endoscopy Unit  Anesthesia Type:MAC  Level of Consciousness: drowsy and responds to stimulation  Airway & Oxygen Therapy: Patient Spontanous Breathing and Patient connected to face mask oxygen  Post-op Assessment: Report given to RN and Post -op Vital signs reviewed and stable  Post vital signs: Reviewed and stable  Last Vitals:  Vitals Value Taken Time  BP    Temp    Pulse 41 11/22/21 1129  Resp 15 11/22/21 1129  SpO2 100 % 11/22/21 1129  Vitals shown include unvalidated device data.  Last Pain:  Vitals:   11/22/21 1030  TempSrc: Temporal  PainSc: 0-No pain         Complications: No notable events documented.

## 2021-11-23 ENCOUNTER — Encounter: Payer: Self-pay | Admitting: Hematology

## 2021-11-23 ENCOUNTER — Other Ambulatory Visit: Payer: Self-pay

## 2021-11-23 ENCOUNTER — Inpatient Hospital Stay (HOSPITAL_BASED_OUTPATIENT_CLINIC_OR_DEPARTMENT_OTHER): Payer: Medicare Other | Admitting: Hematology

## 2021-11-23 ENCOUNTER — Inpatient Hospital Stay: Payer: Medicare Other

## 2021-11-23 ENCOUNTER — Ambulatory Visit: Payer: Medicare Other

## 2021-11-23 VITALS — BP 153/63 | HR 79 | Temp 98.0°F | Resp 18 | Ht 71.0 in | Wt 195.4 lb

## 2021-11-23 DIAGNOSIS — C163 Malignant neoplasm of pyloric antrum: Secondary | ICD-10-CM

## 2021-11-23 LAB — CMP (CANCER CENTER ONLY)
ALT: 20 U/L (ref 0–44)
AST: 21 U/L (ref 15–41)
Albumin: 3.8 g/dL (ref 3.5–5.0)
Alkaline Phosphatase: 96 U/L (ref 38–126)
Anion gap: 6 (ref 5–15)
BUN: 19 mg/dL (ref 8–23)
CO2: 28 mmol/L (ref 22–32)
Calcium: 9.7 mg/dL (ref 8.9–10.3)
Chloride: 106 mmol/L (ref 98–111)
Creatinine: 1.46 mg/dL — ABNORMAL HIGH (ref 0.61–1.24)
GFR, Estimated: 46 mL/min — ABNORMAL LOW (ref >60)
Glucose, Bld: 94 mg/dL (ref 70–99)
Potassium: 4 mmol/L (ref 3.5–5.1)
Sodium: 140 mmol/L (ref 135–145)
Total Bilirubin: 0.4 mg/dL (ref 0.3–1.2)
Total Protein: 7.1 g/dL (ref 6.5–8.1)

## 2021-11-23 LAB — CBC WITH DIFFERENTIAL (CANCER CENTER ONLY)
Abs Immature Granulocytes: 0.01 10*3/uL (ref 0.00–0.07)
Basophils Absolute: 0 10*3/uL (ref 0.0–0.1)
Basophils Relative: 0 %
Eosinophils Absolute: 0.1 10*3/uL (ref 0.0–0.5)
Eosinophils Relative: 2 %
HCT: 38.3 % — ABNORMAL LOW (ref 39.0–52.0)
Hemoglobin: 12.6 g/dL — ABNORMAL LOW (ref 13.0–17.0)
Immature Granulocytes: 0 %
Lymphocytes Relative: 25 %
Lymphs Abs: 1.1 10*3/uL (ref 0.7–4.0)
MCH: 32.1 pg (ref 26.0–34.0)
MCHC: 32.9 g/dL (ref 30.0–36.0)
MCV: 97.7 fL (ref 80.0–100.0)
Monocytes Absolute: 0.3 10*3/uL (ref 0.1–1.0)
Monocytes Relative: 7 %
Neutro Abs: 3.1 10*3/uL (ref 1.7–7.7)
Neutrophils Relative %: 66 %
Platelet Count: 187 10*3/uL (ref 150–400)
RBC: 3.92 MIL/uL — ABNORMAL LOW (ref 4.22–5.81)
RDW: 13.9 % (ref 11.5–15.5)
WBC Count: 4.6 10*3/uL (ref 4.0–10.5)
nRBC: 0 % (ref 0.0–0.2)

## 2021-11-23 LAB — CEA (IN HOUSE-CHCC): CEA (CHCC-In House): 5.78 ng/mL — ABNORMAL HIGH (ref 0.00–5.00)

## 2021-11-23 MED ORDER — PROCHLORPERAZINE MALEATE 10 MG PO TABS: 10.0000 mg | ORAL_TABLET | Freq: Four times a day (QID) | ORAL | 1 refills | Status: DC | PRN

## 2021-11-23 MED ORDER — ONDANSETRON HCL 8 MG PO TABS: 8.0000 mg | ORAL_TABLET | Freq: Two times a day (BID) | ORAL | 1 refills | Status: DC | PRN

## 2021-11-23 MED ORDER — LIDOCAINE-PRILOCAINE 2.5-2.5 % EX CREA: TOPICAL_CREAM | CUTANEOUS | 3 refills | Status: DC

## 2021-11-23 NOTE — Progress Notes (Signed)
Per Secure Chat message between Laurence Compton, [facility], Dr. Alvy Bimler, and [name] orders given by both providers "OK To Treat w/labs drawn on 11/23/2021".  No labs need to be drawn on 11/29/2021.

## 2021-11-23 NOTE — Progress Notes (Signed)
I met with Dillon Smith and his niece Dillon Smith during his appt with Dr Burr Medico this am.  I briefly explained insertion and care of a port a cath.  I showed a sample of the port a cath.  I briefly reviewed FOLFOX treatment.  They are aware that he has chemo education class after his lab appt this am.  I provided my contact information and encouraged them to call with any questions or concerns.  All questions were answered.  They verbalized understanding.

## 2021-11-27 ENCOUNTER — Ambulatory Visit: Payer: Medicare Other | Admitting: Adult Health

## 2021-11-27 ENCOUNTER — Other Ambulatory Visit: Payer: Medicare Other

## 2021-11-27 ENCOUNTER — Ambulatory Visit: Payer: Medicare Other

## 2021-11-27 ENCOUNTER — Other Ambulatory Visit: Payer: Self-pay | Admitting: Radiology

## 2021-11-28 ENCOUNTER — Other Ambulatory Visit: Payer: Self-pay

## 2021-11-28 ENCOUNTER — Telehealth: Payer: Self-pay | Admitting: Hematology

## 2021-11-28 ENCOUNTER — Ambulatory Visit: Payer: Medicare Other

## 2021-11-28 ENCOUNTER — Ambulatory Visit (HOSPITAL_COMMUNITY)
Admission: RE | Admit: 2021-11-28 | Discharge: 2021-11-28 | Disposition: A | Discharge status: Home / Self Care | Payer: No Typology Code available for payment source | Source: Ambulatory Visit | Attending: Hematology | Admitting: Hematology

## 2021-11-28 ENCOUNTER — Encounter (HOSPITAL_COMMUNITY): Payer: Self-pay

## 2021-11-28 DIAGNOSIS — Z87891 Personal history of nicotine dependence: Secondary | ICD-10-CM | POA: Diagnosis not present

## 2021-11-28 DIAGNOSIS — E785 Hyperlipidemia, unspecified: Secondary | ICD-10-CM | POA: Diagnosis not present

## 2021-11-28 DIAGNOSIS — Z8546 Personal history of malignant neoplasm of prostate: Secondary | ICD-10-CM | POA: Insufficient documentation

## 2021-11-28 DIAGNOSIS — I129 Hypertensive chronic kidney disease with stage 1 through stage 4 chronic kidney disease, or unspecified chronic kidney disease: Secondary | ICD-10-CM | POA: Insufficient documentation

## 2021-11-28 DIAGNOSIS — I4891 Unspecified atrial fibrillation: Secondary | ICD-10-CM | POA: Insufficient documentation

## 2021-11-28 DIAGNOSIS — N183 Chronic kidney disease, stage 3 unspecified: Secondary | ICD-10-CM | POA: Diagnosis not present

## 2021-11-28 DIAGNOSIS — I2510 Atherosclerotic heart disease of native coronary artery without angina pectoris: Secondary | ICD-10-CM | POA: Insufficient documentation

## 2021-11-28 DIAGNOSIS — C163 Malignant neoplasm of pyloric antrum: Secondary | ICD-10-CM | POA: Insufficient documentation

## 2021-11-28 HISTORY — PX: IR IMAGING GUIDED PORT INSERTION: IMG5740

## 2021-11-28 MED ORDER — LIDOCAINE-EPINEPHRINE 1 %-1:100000 IJ SOLN
INTRAMUSCULAR | Status: AC
  Filled 2021-11-28: qty 1

## 2021-11-28 MED ORDER — FENTANYL CITRATE (PF) 100 MCG/2ML IJ SOLN
INTRAMUSCULAR | Status: AC
  Filled 2021-11-28: qty 2

## 2021-11-28 MED ORDER — MIDAZOLAM HCL 2 MG/2ML IJ SOLN
INTRAMUSCULAR | Status: AC
  Filled 2021-11-28: qty 2

## 2021-11-28 MED ORDER — HEPARIN SOD (PORK) LOCK FLUSH 100 UNIT/ML IV SOLN
INTRAVENOUS | Status: AC
  Filled 2021-11-28: qty 5

## 2021-11-28 MED ORDER — FENTANYL CITRATE (PF) 100 MCG/2ML IJ SOLN
INTRAMUSCULAR | Status: AC | PRN
  Administered 2021-11-28 (×4): 25 ug via INTRAVENOUS

## 2021-11-28 MED ORDER — MIDAZOLAM HCL 2 MG/2ML IJ SOLN
INTRAMUSCULAR | Status: AC | PRN
  Administered 2021-11-28 (×2): .5 mg via INTRAVENOUS
  Administered 2021-11-28: 1 mg via INTRAVENOUS

## 2021-11-28 MED ORDER — SODIUM CHLORIDE 0.9 % IV SOLN: INTRAVENOUS | Status: DC

## 2021-11-28 MED ORDER — SODIUM CHLORIDE 0.9 % IV SOLN
INTRAVENOUS | Status: AC | PRN
  Administered 2021-11-28: 10 mL/h via INTRAVENOUS

## 2021-11-28 NOTE — Procedures (Signed)
Pre Procedure Dx: Poor venous access Post Procedural Dx: Same  Successful placement of right IJ approach port-a-cath with tip at the superior caval atrial junction. The catheter is ready for immediate use.  Estimated Blood Loss: Trace  Complications: None immediate.  Jay Saramarie Stinger, MD Pager #: [phone]   

## 2021-11-28 NOTE — H&P (Signed)
Chief Complaint: Patient was seen in consultation today for tunneled catheter with port placement at the request of Feng,Yan  Referring Physician(s): Feng,Yan  Supervising Physician: Sandi Mariscal  Patient Status: [facility] [place] - Out-pt  History of Present Illness: Dillon Smith is a 88 y.o. male with PMH of bradycardia, CKD, CAD, HLD, HTN, A-fib, CVA, PVC's and prostate cancer. 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 Pt was referred to IR for tunneled catheter with port placement for chemotherapy.   Past Medical History:  Diagnosis Date   Bradycardia    CKD (chronic kidney disease), stage III ([place])    Coronary artery disease    CABG 2007   COVID-19 08/2019   Hyperlipidemia LDL goal <70    Hypertension    Persistent atrial fibrillation ([place])    Prostate cancer ([place]) 2010   PVC's (premature ventricular contractions)    Stroke (cerebrum) ([place])     Past Surgical History:  Procedure Laterality Date   COLONOSCOPY  2004   ESOPHAGOGASTRODUODENOSCOPY (EGD) WITH PROPOFOL N/A 11/22/2021   Procedure: ESOPHAGOGASTRODUODENOSCOPY (EGD) WITH PROPOFOL;  Surgeon: Carol Ada, MD;  Location: [facility] ENDOSCOPY;  Service: Gastroenterology;  Laterality: N/A;   EUS N/A 11/22/2021   Procedure: UPPER ENDOSCOPIC ULTRASOUND (EUS) RADIAL;  Surgeon: Carol Ada, MD;  Location: [facility] ENDOSCOPY;  Service: Gastroenterology;  Laterality: N/A;   INSERTION PROSTATE RADIATION SEED  2010   stent      Allergies: Penicillins  Medications: Prior to Admission medications   Medication Sig Start Date End Date Taking? Authorizing Provider  amLODipine (NORVASC) 10 MG tablet Take 10 mg by mouth daily. 06/01/19  Yes [provider]  apixaban (ELIQUIS) 5 MG TABS tablet Take 2.5 mg by mouth 2 (two) times daily.   Yes [provider]  atorvastatin (LIPITOR) 40 MG tablet Take 1 tablet (40 mg total) by mouth at  bedtime. 08/17/19  Yes Garvin Fila, MD  Multiple Vitamin (MULTI VITAMIN DAILY PO) Take 1 tablet by mouth daily.   Yes [provider]  lidocaine-prilocaine (EMLA) cream Apply to affected area once 11/23/21   [name], MD  ondansetron (ZOFRAN) 8 MG tablet Take 1 tablet (8 mg total) by mouth 2 (two) times daily as needed for refractory nausea / vomiting. Start on day 3 after chemotherapy. 11/23/21   [name], MD  prochlorperazine (COMPAZINE) 10 MG tablet Take 1 tablet (10 mg total) by mouth every 6 (six) hours as needed (Nausea or vomiting). 11/23/21   [name], MD     Family History  Problem Relation Age of Onset   Cancer Neg Hx     Social History   Socioeconomic History   Marital status: Divorced    Spouse name: Not on file   Number of children: 2   Years of education: Not on file   Highest education level: Not on file  Occupational History   Not on file  Tobacco Use   Smoking status: Former   Smokeless tobacco: Never   Tobacco comments:    Smoked occasionally < 1 pack per week as an adult, quit 1976  Substance and Sexual Activity   Alcohol use: Not Currently   Drug use: Not Currently   Sexual activity: Not on file  Other Topics Concern   Not on file  Social History Narrative   Not on file   Social Determinants of Health   Financial Resource Strain: Not on file  Food  Insecurity: Not on file  Transportation Needs: Not on file  Physical Activity: Not on file  Stress: Not on file  Social Connections: Not on file    Review of Systems: A 12 point ROS discussed and pertinent positives are indicated in the HPI above.  All other systems are negative.  Review of Systems  All other systems reviewed and are negative.  Vital Signs: BP (!) 146/79   Pulse (!) 40   Temp 97.9 F (36.6 C) (Oral)   Resp 16   Ht '5\' 11"'$  (1.803 m)   Wt 194 lb (88 kg)   SpO2 100%   BMI 27.06 kg/m   Physical Exam Vitals reviewed.  Eyes:     Extraocular Movements: Extraocular  movements intact.     Pupils: Pupils are equal, round, and reactive to light.  Cardiovascular:     Rate and Rhythm: Rhythm irregular.  Pulmonary:     Effort: Pulmonary effort is normal. No respiratory distress.  Abdominal:     Palpations: Abdomen is soft.     Tenderness: There is no abdominal tenderness.  Skin:    General: Skin is warm and dry.  Neurological:     Mental Status: He is alert and oriented to person, place, and time.  Psychiatric:        Mood and Affect: Mood normal.        Behavior: Behavior normal.        Thought Content: Thought content normal.        Judgment: Judgment normal.    Imaging: No results found.  Labs:  CBC: Recent Labs    02/06/21 0845 10/29/21 1213 11/23/21 0905  WBC 5.7 5.5 4.6  HGB 13.1 12.1* 12.6*  HCT 40.1 37.1* 38.3*  PLT 184 214 187    COAGS: No results for input(s): INR, APTT in the last 8760 hours.  BMP: Recent Labs    02/06/21 0845 10/29/21 1213 11/23/21 0905  NA 143 141 140  K 4.6 4.4 4.0  CL 106 108 106  CO2 '22 28 28  '$ GLUCOSE 94 106* 94  BUN '23 23 19  '$ CALCIUM 9.7 9.5 9.7  CREATININE 1.77* 1.40* 1.46*  GFRNONAA  --  48* 46*    LIVER FUNCTION TESTS: Recent Labs    02/06/21 0845 10/29/21 1213 11/23/21 0905  BILITOT 0.5 0.4 0.4  AST '27 19 21  '$ ALT '30 23 20  '$ ALKPHOS 84 84 96  PROT 6.4 7.1 7.1  ALBUMIN 3.9 3.8 3.8    TUMOR MARKERS: No results for input(s): AFPTM, CEA, CA199, CHROMGRNA in the last 8760 hours.  Assessment and Plan: History of bradycardia, CKD, CAD, HLD, HTN, A-fib, CVA, PVC's and prostate cancer. 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 Pt was referred to IR for tunneled catheter with port placement for chemotherapy.   Risks and benefits of image guided tunneled catheter with port placement was discussed with the patient including, but not limited to bleeding, infection, pneumothorax, or fibrin  sheath development and need for additional procedures.  All of the patient's questions were answered, patient is agreeable to proceed. Consent signed and in chart.   Thank you for this interesting consult.  I greatly enjoyed meeting IHSAN NOMURA and look forward to participating in their care.  A copy of this report was sent to the requesting provider on this date.  Electronically Signed: Tyson Alias, NP 11/28/2021, 11:57 AM   I  spent a total of 20 minutes in face to face in clinical consultation, greater than 50% of which was counseling/coordinating care for tunneled catheter with port placement.

## 2021-11-28 NOTE — Discharge Instructions (Signed)
For questions /concerns may call Interventional Radiology at [phone] ° °You may remove your dressing and shower tomorrow afternoon ° °DO NOT use EMLA cream for 2 weeks after port placement as the cream will remove surgical glue on your incision.    ° ° °Implanted Port Insertion, Care After °This sheet gives you information about how to care for yourself after your procedure. Your health care provider may also give you more specific instructions. If you have problems or questions, contact your health careprovider. °What can I expect after the procedure? °After the procedure, it is common to have: °Discomfort at the port insertion site. °Bruising on the skin over the port. This should improve over 3-4 days. °Follow these instructions at home: °Port care °After your port is placed, you will get a manufacturer's information card. The card has information about your port. Keep this card with you at all times. °Take care of the port as told by your health care provider. Ask your health care provider if you or a family member can get training for taking care of the port at home. A home health care nurse may also take care of the port. °Make sure to remember what type of port you have. °Incision care °Follow instructions from your health care provider about how to take care of your port insertion site. Make sure you: °Wash your hands with soap and water before and after you change your bandage (dressing). If soap and water are not available, use hand sanitizer. °Change your dressing as told by your health care provider. °Leave skin glue, or adhesive strips in place. These skin closures may need to stay in place for 2 weeks or longer.  °Check your port insertion site every day for signs of infection. Check for: °     - Redness, swelling, or pain. °                    - Fluid or blood. °     - Warmth. °     - Pus or a bad smell. °Activity °Return to your normal activities as told by your health care provider. Ask your  health care provider what activities are safe for you. °Do not lift anything that is heavier than 10 lb (4.5 kg), or the limit that you are told, until your health care provider says that it is safe. °General instructions °Take over-the-counter and prescription medicines only as told by your health care provider. °Do not take baths, swim, or use a hot tub until your health care provider approves. Ask your health care provider if you may take showers. You may only be allowed to take sponge baths. °Do not drive for 24 hours if you were given a sedative during your procedure. °Wear a medical alert bracelet in case of an emergency. This will tell any health care providers that you have a port. °Keep all follow-up visits as told by your health care provider. This is important. °Contact a health care provider if: °You cannot flush your port with saline as directed, or you cannot draw blood from the port. °You have a fever or chills. °You have redness, swelling, or pain around your port insertion site. °You have fluid or blood coming from your port insertion site. °Your port insertion site feels warm to the touch. °You have pus or a bad smell coming from the port insertion site. °Get help right away if: °You have chest pain or shortness of breath. °You have bleeding from   your port that you cannot control. °Summary °Take care of the port as told by your health care provider. Keep the manufacturer's information card with you at all times. °Change your dressing as told by your health care provider. °Contact a health care provider if you have a fever or chills or if you have redness, swelling, or pain around your port insertion site. °Keep all follow-up visits as told by your health care provider. °This information is not intended to replace advice given to you by your health care provider. Make sure you discuss any questions you have with your healthcare provider. ° °Moderate Conscious Sedation, Adult, Care After °This sheet  gives you information about how to care for yourself after your procedure. Your health care provider may also give you more specific instructions. If you have problems or questions, contact your health careprovider. °What can I expect after the procedure? °After the procedure, it is common to have: °Sleepiness for several hours. °Impaired judgment for several hours. °Difficulty with balance. °Vomiting if you eat too soon. °Follow these instructions at home: °For the time period you were told by your health care provider: °Rest. °Do not participate in activities where you could fall or become injured. °Do not drive or use machinery. °Do not drink alcohol. °Do not take sleeping pills or medicines that cause drowsiness. °Do not make important decisions or sign legal documents. °Do not take care of children on your own. °Eating and drinking ° °Follow the diet recommended by your health care provider. °Drink enough fluid to keep your urine pale yellow. °If you vomit: °Drink water, juice, or soup when you can drink without vomiting. °Make sure you have little or no nausea before eating solid foods. ° °General instructions °Take over-the-counter and prescription medicines only as told by your health care provider. °Have a responsible adult stay with you for the time you are told. It is important to have someone help care for you until you are awake and alert. °Do not smoke. °Keep all follow-up visits as told by your health care provider. This is important. °Contact a health care provider if: °You are still sleepy or having trouble with balance after 24 hours. °You feel light-headed. °You keep feeling nauseous or you keep vomiting. °You develop a rash. °You have a fever. °You have redness or swelling around the IV site. °Get help right away if: °You have trouble breathing. °You have new-onset confusion at home. °Summary °After the procedure, it is common to feel sleepy, have impaired judgment, or feel nauseous if you eat  too soon. °Rest after you get home. Know the things you should not do after the procedure. °Follow the diet recommended by your health care provider and drink enough fluid to keep your urine pale yellow. °Get help right away if you have trouble breathing or new-onset confusion at home. °This information is not intended to replace advice given to you by your health care provider. Make sure you discuss any questions you have with your healthcare provider. °Document Revised: 10/15/2019 Document Reviewed: 05/13/2019 °Elsevier Patient Education © 2022 [employer]  °

## 2021-11-28 NOTE — Telephone Encounter (Signed)
Scheduled follow-up appointments per 5/26 los. Patient is aware.

## 2021-11-29 ENCOUNTER — Other Ambulatory Visit: Payer: Medicare Other

## 2021-11-29 ENCOUNTER — Ambulatory Visit: Payer: Medicare Other

## 2021-11-29 ENCOUNTER — Inpatient Hospital Stay: Payer: Medicare Other | Attending: Nurse Practitioner

## 2021-11-29 VITALS — BP 131/75 | HR 81 | Temp 98.1°F | Resp 18 | Wt 193.8 lb

## 2021-11-29 DIAGNOSIS — Z79899 Other long term (current) drug therapy: Secondary | ICD-10-CM | POA: Diagnosis not present

## 2021-11-29 DIAGNOSIS — C163 Malignant neoplasm of pyloric antrum: Secondary | ICD-10-CM | POA: Diagnosis present

## 2021-11-29 DIAGNOSIS — Z5111 Encounter for antineoplastic chemotherapy: Secondary | ICD-10-CM | POA: Diagnosis present

## 2021-11-29 MED ORDER — OXALIPLATIN CHEMO INJECTION 100 MG/20ML
70.0000 mg/m2 | Freq: Once | INTRAVENOUS | Status: AC
  Administered 2021-11-29: 150 mg via INTRAVENOUS
  Filled 2021-11-29: qty 30

## 2021-11-29 MED ORDER — LEUCOVORIN CALCIUM INJECTION 350 MG
400.0000 mg/m2 | Freq: Once | INTRAVENOUS | Status: AC
  Administered 2021-11-29: 848 mg via INTRAVENOUS
  Filled 2021-11-29: qty 42.4

## 2021-11-29 MED ORDER — DEXTROSE 5 % IV SOLN: Freq: Once | INTRAVENOUS | Status: AC

## 2021-11-29 MED ORDER — SODIUM CHLORIDE 0.9 % IV SOLN
10.0000 mg | Freq: Once | INTRAVENOUS | Status: AC
  Administered 2021-11-29: 10 mg via INTRAVENOUS
  Filled 2021-11-29: qty 10

## 2021-11-29 MED ORDER — SODIUM CHLORIDE 0.9 % IV SOLN
2000.0000 mg/m2 | INTRAVENOUS | Status: DC
  Administered 2021-11-29: 4250 mg via INTRAVENOUS
  Filled 2021-11-29: qty 85

## 2021-11-29 MED ORDER — PALONOSETRON HCL INJECTION 0.25 MG/5ML
0.2500 mg | Freq: Once | INTRAVENOUS | Status: AC
  Administered 2021-11-29: 0.25 mg via INTRAVENOUS
  Filled 2021-11-29: qty 5

## 2021-11-29 NOTE — Patient Instructions (Addendum)
[facility] ONCOLOGY  Discharge Instructions: Thank you for choosing [facility] to provide your oncology and hematology care.   If you have a lab appointment with the [facility], please go directly to the [facility] and check in at the registration area.   Wear comfortable clothing and clothing appropriate for easy access to any Portacath or PICC line.   We strive to give you quality time with your provider. You may need to reschedule your appointment if you arrive late (15 or more minutes).  Arriving late affects you and other patients whose appointments are after yours.  Also, if you miss three or more appointments without notifying the office, you may be dismissed from the clinic at the provider's discretion.      For prescription refill requests, have your pharmacy contact our office and allow 72 hours for refills to be completed.    Today you received the following chemotherapy and/or immunotherapy agents leucovorin, fluorourcil, oxaliplatin      To help prevent nausea and vomiting after your treatment, we encourage you to take your nausea medication as directed.  BELOW ARE SYMPTOMS THAT SHOULD BE REPORTED IMMEDIATELY: *FEVER GREATER THAN 100.4 F (38 C) OR HIGHER *CHILLS OR SWEATING *NAUSEA AND VOMITING THAT IS NOT CONTROLLED WITH YOUR NAUSEA MEDICATION *UNUSUAL SHORTNESS OF BREATH *UNUSUAL BRUISING OR BLEEDING *URINARY PROBLEMS (pain or burning when urinating, or frequent urination) *BOWEL PROBLEMS (unusual diarrhea, constipation, pain near the anus) TENDERNESS IN MOUTH AND THROAT WITH OR WITHOUT PRESENCE OF ULCERS (sore throat, sores in mouth, or a toothache) UNUSUAL RASH, SWELLING OR PAIN  UNUSUAL VAGINAL DISCHARGE OR ITCHING   Items with * indicate a potential emergency and should be followed up as soon as possible or go to the Emergency Department if any problems should occur.  Please show the CHEMOTHERAPY ALERT CARD or IMMUNOTHERAPY  ALERT CARD at check-in to the Emergency Department and triage nurse.  Should you have questions after your visit or need to cancel or reschedule your appointment, please contact [facility]  Dept: [phone]  and follow the prompts.  Office hours are 8:00 a.m. to 4:30 p.m. Monday - Friday. Please note that voicemails left after 4:00 p.m. may not be returned until the following business day.  We are closed weekends and major holidays. You have access to a nurse at all times for urgent questions. Please call the main number to the clinic Dept: [phone] and follow the prompts.   For any non-urgent questions, you may also contact your provider using MyChart. We now offer e-Visits for anyone 32 and older to request care online for non-urgent symptoms. For details visit [email or web address].   Also download the MyChart app! Go to the app store, search "MyChart", open the app, select [facility], and log in with your MyChart username and password.  Due to Covid, a mask is required upon entering the hospital/clinic. If you do not have a mask, one will be given to you upon arrival. For doctor visits, patients may have 1 support person aged 38 or older with them. For treatment visits, patients cannot have anyone with them due to current Covid guidelines and our immunocompromised population.   The chemotherapy medication bag should finish at 46 hours, 96 hours, or 7 days. For example, if your pump is scheduled for 46 hours and it was put on at 4:00 p.m., it should finish at 2:00 p.m. the day it is scheduled to come off regardless  of your appointment time.     Estimated time to finish at: 10:00 am   If the display on your pump reads "Low Volume" and it is beeping, take the batteries out of the pump and come to the cancer center for it to be taken off.   If the pump alarms go off prior to the pump reading "Low Volume" then call [phone] and someone can assist  you.  If the plunger comes out and the chemotherapy medication is leaking out, please use your home chemo spill kit to clean up the spill. Do NOT use paper towels or other household products.  If you have problems or questions regarding your pump, please call either [phone] (24 hours a day) or the cancer center Monday-Friday 8:00 a.m.- 4:30 p.m. at the clinic number and we will assist you. If you are unable to get assistance, then go to the nearest Emergency Department and ask the staff to contact the IV team for assistance.

## 2021-11-30 ENCOUNTER — Telehealth: Payer: Self-pay

## 2021-11-30 ENCOUNTER — Ambulatory Visit: Payer: Medicare Other

## 2021-11-30 NOTE — Telephone Encounter (Signed)
Dillon Smith states that he feels good. He is eating, drinking, and urinating well. He knows to call Dr. Ernestina Penna office at [phone] if he has any questions or concerns.

## 2021-11-30 NOTE — Telephone Encounter (Signed)
-----   Message from Gillian Shields, RN sent at 11/29/2021  3:36 PM EDT ----- Regarding: f/u first time [facility] FENG Follow up first time fol[facility]. [name]. Pt completed treatment with no issues. Pt lives alone and is hard of hearing.

## 2021-12-01 ENCOUNTER — Inpatient Hospital Stay: Payer: Medicare Other

## 2021-12-01 VITALS — BP 151/69 | HR 60 | Temp 98.6°F | Resp 17

## 2021-12-01 DIAGNOSIS — C163 Malignant neoplasm of pyloric antrum: Secondary | ICD-10-CM

## 2021-12-01 DIAGNOSIS — Z5111 Encounter for antineoplastic chemotherapy: Secondary | ICD-10-CM | POA: Diagnosis not present

## 2021-12-01 MED ORDER — HEPARIN SOD (PORK) LOCK FLUSH 100 UNIT/ML IV SOLN
500.0000 [IU] | Freq: Once | INTRAVENOUS | Status: AC | PRN
  Administered 2021-12-01: 500 [IU]

## 2021-12-01 MED ORDER — SODIUM CHLORIDE 0.9% FLUSH
10.0000 mL | INTRAVENOUS | Status: DC | PRN
  Administered 2021-12-01: 10 mL

## 2021-12-03 ENCOUNTER — Ambulatory Visit: Payer: Medicare Other

## 2021-12-03 ENCOUNTER — Ambulatory Visit: Payer: Medicare Other | Admitting: Radiation Oncology

## 2021-12-04 ENCOUNTER — Ambulatory Visit: Payer: Medicare Other | Admitting: Physician Assistant

## 2021-12-04 ENCOUNTER — Ambulatory Visit: Payer: Medicare Other

## 2021-12-04 ENCOUNTER — Other Ambulatory Visit: Payer: Medicare Other

## 2021-12-04 NOTE — Progress Notes (Signed)
I spoke with [name]' niece Dillon Smith.  She states he is doing good.  He has tolerated chemotherapy well and denies any side effects.

## 2021-12-04 NOTE — Progress Notes (Signed)
I left vm  requesting Dillon Smith return my call.  I let him know I wanted to see how he is feeling after his first chemotherapy treatment last week.  I left my direct contact information.

## 2021-12-05 ENCOUNTER — Ambulatory Visit: Payer: Medicare Other

## 2021-12-06 ENCOUNTER — Ambulatory Visit: Payer: Medicare Other

## 2021-12-07 ENCOUNTER — Ambulatory Visit: Payer: Medicare Other

## 2021-12-10 ENCOUNTER — Ambulatory Visit: Payer: Medicare Other

## 2021-12-11 ENCOUNTER — Ambulatory Visit: Payer: Medicare Other | Admitting: Physician Assistant

## 2021-12-11 ENCOUNTER — Other Ambulatory Visit: Payer: Medicare Other

## 2021-12-11 ENCOUNTER — Ambulatory Visit: Payer: Medicare Other

## 2021-12-12 ENCOUNTER — Ambulatory Visit: Payer: Medicare Other

## 2021-12-13 ENCOUNTER — Ambulatory Visit: Payer: Medicare Other

## 2021-12-13 ENCOUNTER — Ambulatory Visit: Payer: Medicare Other | Admitting: Hematology

## 2021-12-13 ENCOUNTER — Inpatient Hospital Stay: Payer: Medicare Other

## 2021-12-13 ENCOUNTER — Encounter: Payer: Self-pay | Admitting: Hematology

## 2021-12-13 ENCOUNTER — Other Ambulatory Visit: Payer: Self-pay

## 2021-12-13 ENCOUNTER — Inpatient Hospital Stay (HOSPITAL_BASED_OUTPATIENT_CLINIC_OR_DEPARTMENT_OTHER): Payer: Medicare Other | Admitting: Hematology

## 2021-12-13 ENCOUNTER — Other Ambulatory Visit: Payer: Medicare Other

## 2021-12-13 VITALS — BP 119/59 | HR 89 | Temp 98.6°F | Resp 18 | Wt 191.1 lb

## 2021-12-13 DIAGNOSIS — C163 Malignant neoplasm of pyloric antrum: Secondary | ICD-10-CM

## 2021-12-13 DIAGNOSIS — Z95828 Presence of other vascular implants and grafts: Secondary | ICD-10-CM | POA: Insufficient documentation

## 2021-12-13 DIAGNOSIS — Z5111 Encounter for antineoplastic chemotherapy: Secondary | ICD-10-CM | POA: Diagnosis not present

## 2021-12-13 LAB — CBC WITH DIFFERENTIAL (CANCER CENTER ONLY)
Abs Immature Granulocytes: 0 10*3/uL (ref 0.00–0.07)
Basophils Absolute: 0 10*3/uL (ref 0.0–0.1)
Basophils Relative: 0 %
Eosinophils Absolute: 0.2 10*3/uL (ref 0.0–0.5)
Eosinophils Relative: 4 %
HCT: 33.7 % — ABNORMAL LOW (ref 39.0–52.0)
Hemoglobin: 11.3 g/dL — ABNORMAL LOW (ref 13.0–17.0)
Immature Granulocytes: 0 %
Lymphocytes Relative: 24 %
Lymphs Abs: 1.1 10*3/uL (ref 0.7–4.0)
MCH: 31.9 pg (ref 26.0–34.0)
MCHC: 33.5 g/dL (ref 30.0–36.0)
MCV: 95.2 fL (ref 80.0–100.0)
Monocytes Absolute: 0.5 10*3/uL (ref 0.1–1.0)
Monocytes Relative: 10 %
Neutro Abs: 2.9 10*3/uL (ref 1.7–7.7)
Neutrophils Relative %: 62 %
Platelet Count: 177 10*3/uL (ref 150–400)
RBC: 3.54 MIL/uL — ABNORMAL LOW (ref 4.22–5.81)
RDW: 13.4 % (ref 11.5–15.5)
WBC Count: 4.7 10*3/uL (ref 4.0–10.5)
nRBC: 0 % (ref 0.0–0.2)

## 2021-12-13 LAB — CMP (CANCER CENTER ONLY)
ALT: 17 U/L (ref 0–44)
AST: 18 U/L (ref 15–41)
Albumin: 3.6 g/dL (ref 3.5–5.0)
Alkaline Phosphatase: 80 U/L (ref 38–126)
Anion gap: 6 (ref 5–15)
BUN: 21 mg/dL (ref 8–23)
CO2: 27 mmol/L (ref 22–32)
Calcium: 9.5 mg/dL (ref 8.9–10.3)
Chloride: 109 mmol/L (ref 98–111)
Creatinine: 1.57 mg/dL — ABNORMAL HIGH (ref 0.61–1.24)
GFR, Estimated: 42 mL/min — ABNORMAL LOW (ref >60)
Glucose, Bld: 120 mg/dL — ABNORMAL HIGH (ref 70–99)
Potassium: 3.9 mmol/L (ref 3.5–5.1)
Sodium: 142 mmol/L (ref 135–145)
Total Bilirubin: 0.5 mg/dL (ref 0.3–1.2)
Total Protein: 6.9 g/dL (ref 6.5–8.1)

## 2021-12-13 LAB — CEA (IN HOUSE-CHCC): CEA (CHCC-In House): 5.54 ng/mL — ABNORMAL HIGH (ref 0.00–5.00)

## 2021-12-13 MED ORDER — DEXTROSE 5 % IV SOLN: Freq: Once | INTRAVENOUS | Status: AC

## 2021-12-13 MED ORDER — SODIUM CHLORIDE 0.9 % IV SOLN
2000.0000 mg/m2 | INTRAVENOUS | Status: DC
  Administered 2021-12-13: 4250 mg via INTRAVENOUS
  Filled 2021-12-13: qty 85

## 2021-12-13 MED ORDER — OXALIPLATIN CHEMO INJECTION 100 MG/20ML
70.0000 mg/m2 | Freq: Once | INTRAVENOUS | Status: AC
  Administered 2021-12-13: 150 mg via INTRAVENOUS
  Filled 2021-12-13: qty 30

## 2021-12-13 MED ORDER — PALONOSETRON HCL INJECTION 0.25 MG/5ML
0.2500 mg | Freq: Once | INTRAVENOUS | Status: AC
  Administered 2021-12-13: 0.25 mg via INTRAVENOUS
  Filled 2021-12-13: qty 5

## 2021-12-13 MED ORDER — SODIUM CHLORIDE 0.9% FLUSH
10.0000 mL | Freq: Once | INTRAVENOUS | Status: AC
  Administered 2021-12-13: 10 mL

## 2021-12-13 MED ORDER — SODIUM CHLORIDE 0.9 % IV SOLN
10.0000 mg | Freq: Once | INTRAVENOUS | Status: AC
  Administered 2021-12-13: 10 mg via INTRAVENOUS
  Filled 2021-12-13: qty 10

## 2021-12-13 MED ORDER — LEUCOVORIN CALCIUM INJECTION 350 MG
400.0000 mg/m2 | Freq: Once | INTRAVENOUS | Status: AC
  Administered 2021-12-13: 848 mg via INTRAVENOUS
  Filled 2021-12-13: qty 42.4

## 2021-12-13 NOTE — Patient Instructions (Signed)
[facility] ONCOLOGY  Discharge Instructions: Thank you for choosing [facility] to provide your oncology and hematology care.   If you have a lab appointment with the [facility], please go directly to the [facility] and check in at the registration area.   Wear comfortable clothing and clothing appropriate for easy access to any Portacath or PICC line.   We strive to give you quality time with your provider. You may need to reschedule your appointment if you arrive late (15 or more minutes).  Arriving late affects you and other patients whose appointments are after yours.  Also, if you miss three or more appointments without notifying the office, you may be dismissed from the clinic at the provider's discretion.      For prescription refill requests, have your pharmacy contact our office and allow 72 hours for refills to be completed.    Today you received the following chemotherapy and/or immunotherapy agents leucovorin, fluorourcil, oxaliplatin      To help prevent nausea and vomiting after your treatment, we encourage you to take your nausea medication as directed.  BELOW ARE SYMPTOMS THAT SHOULD BE REPORTED IMMEDIATELY: *FEVER GREATER THAN 100.4 F (38 C) OR HIGHER *CHILLS OR SWEATING *NAUSEA AND VOMITING THAT IS NOT CONTROLLED WITH YOUR NAUSEA MEDICATION *UNUSUAL SHORTNESS OF BREATH *UNUSUAL BRUISING OR BLEEDING *URINARY PROBLEMS (pain or burning when urinating, or frequent urination) *BOWEL PROBLEMS (unusual diarrhea, constipation, pain near the anus) TENDERNESS IN MOUTH AND THROAT WITH OR WITHOUT PRESENCE OF ULCERS (sore throat, sores in mouth, or a toothache) UNUSUAL RASH, SWELLING OR PAIN  UNUSUAL VAGINAL DISCHARGE OR ITCHING   Items with * indicate a potential emergency and should be followed up as soon as possible or go to the Emergency Department if any problems should occur.  Please show the CHEMOTHERAPY ALERT CARD or IMMUNOTHERAPY  ALERT CARD at check-in to the Emergency Department and triage nurse.  Should you have questions after your visit or need to cancel or reschedule your appointment, please contact [facility]  Dept: [phone]  and follow the prompts.  Office hours are 8:00 a.m. to 4:30 p.m. Monday - Friday. Please note that voicemails left after 4:00 p.m. may not be returned until the following business day.  We are closed weekends and major holidays. You have access to a nurse at all times for urgent questions. Please call the main number to the clinic Dept: [phone] and follow the prompts.   For any non-urgent questions, you may also contact your provider using MyChart. We now offer e-Visits for anyone 5 and older to request care online for non-urgent symptoms. For details visit [email or web address].   Also download the MyChart app! Go to the app store, search "MyChart", open the app, select [facility], and log in with your MyChart username and password.  Due to Covid, a mask is required upon entering the hospital/clinic. If you do not have a mask, one will be given to you upon arrival. For doctor visits, patients may have 1 support person aged 72 or older with them. For treatment visits, patients cannot have anyone with them due to current Covid guidelines and our immunocompromised population.   The chemotherapy medication bag should finish at 46 hours, 96 hours, or 7 days. For example, if your pump is scheduled for 46 hours and it was put on at 4:00 p.m., it should finish at 2:00 p.m. the day it is scheduled to come off regardless  of your appointment time.     Estimated time to finish at: 10:00 am   If the display on your pump reads "Low Volume" and it is beeping, take the batteries out of the pump and come to the cancer center for it to be taken off.   If the pump alarms go off prior to the pump reading "Low Volume" then call [phone] and someone can assist  you.  If the plunger comes out and the chemotherapy medication is leaking out, please use your home chemo spill kit to clean up the spill. Do NOT use paper towels or other household products.  If you have problems or questions regarding your pump, please call either [phone] (24 hours a day) or the cancer center Monday-Friday 8:00 a.m.- 4:30 p.m. at the clinic number and we will assist you. If you are unable to get assistance, then go to the nearest Emergency Department and ask the staff to contact the IV team for assistance.

## 2021-12-13 NOTE — Progress Notes (Signed)
[name]   Telephone:([phone]) [phone] Fax:(336) [phone]   Clinic Follow up Note   Patient Care Team: [facility], [name] as PCP - General [name], MD as PCP - Cardiology (Cardiology) Thompson Grayer, MD as PCP - Electrophysiology (Cardiology) [name], MD as Consulting Physician (Radiation Oncology) [name], RN as Registered Nurse [name], MD as Consulting Physician (Hematology) [name], NP as Nurse Practitioner (Nurse Practitioner)  Date of Service:  12/13/2021  CHIEF COMPLAINT: f/u of gastric cancer  CURRENT THERAPY:  Neoadjuvant FOLFOX, q14d, started 11/29/21  ASSESSMENT & PLAN:  Dillon Smith is a 88 y.o. male with   1. Adenocarcinoma of the pylorus/distal stomach, cT3N0M0, stage IIB, MMR normal, PD-L1 CPS 2% -presented with reported 30 pound weight loss over a year, and 2-week history of GERD, constipation, and regurgitation. CT AP w/o contrast at the [facility] on 09/12/21 showed pyloric thickening. Biopsy from pyloric channel obtained during EGD on 09/14/21 confirmed adenocarcinoma, MMR was normal. Baseline CEA and CA19.9 normal  -Due to gastric outlet obstruction he underwent EGD/EUS with pylori stent placement on 09/21/21 and his symptoms completely resolved  -He was referred from [place] to [name] for palliative radiation. Staging CT 09/21/21 and PET scan 10/18/21 were negative for lymphadenopathy or distant metastasis -staging EUS 11/22/21 showed T3 N0 disease  -He has met general surgeon Dr. [name], who thinks he is a candidate for surgical resection. -he was started on neoadjuvant FOLFOX on 11/29/21. He tolerated cycle one well overall, no noticeable side effects. -labs reviewed, overall stable and expected on treatment. He notes he has not received the emla cream from his pharmacy yet; I advised him to call his pharmacy.   2. GERD, constipation, regurgitation, gastric outlet obstruction, weight loss -Secondary to #1 -much improved with  gastrojejunostomy -Follow-up with dietitian   3. Comorbidities: HTN, HL, A-fib, history of stroke, prostate cancer 2004 s/p brachytherapy, CKD stage III  -Currently on amlodipine, apixaban, and atorvastatin. Denies bleeding on anticoagulation  -He has no residual deficits from previous stroke -His chronic conditions appear to be well controlled -Last PSA was normal per patient, checked at the [place] -He has CKD and mild anemia, will monitor closely on chemo.    4. Social -Patient is single with 2 adult daughters who live out of state.  He is a high functioning gentleman who lives alone and is independent with ADLs including driving -His neighbor/friend checks on him and helps with medication -His niece came in with him today   5. Goals of care -he is full code      PLAN: -proceed with C2 FOLFOX today at same dose  -lab, flush, f/u, and FOLFOX every 2 weeks  -he prefers very early morning appointments   No problem-specific Assessment & Plan notes found for this encounter.   SUMMARY OF ONCOLOGIC HISTORY: Oncology History  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[place])  09/14/2021 Initial Biopsy   A. pyloric channel biopsy -Adenocarcinoma involving superficial gastric mucosa Entheses the biopsy is superficial and the depth of invasion cannot be accurately determined) -Reactive gastropathy and benign duodenal mucosa  B. gastric biopsy -Chronic active gastritis -Immunostain for H. pylori is negative  NGS, MMR, PD-L1 are pending   09/14/2021 Initial Diagnosis   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[place])   09/14/2021 Cancer Staging   Staging form: Stomach, AJCC 8th Edition - Clinical stage from 09/14/2021: Stage IIB (cT3, cN0, cM0) - Signed by [name], MD on 11/23/2021 Stage prefix: Initial diagnosis Total positive  nodes: 0   09/16/2021 Imaging   CT AP w contrast  1.  Findings in keeping with gastric outlet obstruction from circumferential gastric antral mass lesion. Recommend correlation with EGD and  biopsy.  2.  Indeterminate complex enhancing left adrenal mass. The degree of heterogeneity and enhancement would be atypical for adenoma. Metastatic disease or pheochromocytoma are therefore considerations.  3.  Bilobed infrarenal abdominal aortic aneurysm measuring up to 3.4 cm with right common iliac artery aneurysm.  4.  Small left pleural effusion.   09/21/2021 Procedure   EUS/EGD Findings  EGD: The esophagus was normal. The gastric body and antrum appeared  normal. Evidence of tumor was seen at the pylorus, and extending into the  pyloric channel and the duodenal bulb resulting in severe luminal  narrowing.   EUS was then performed. FNA was performed.   Impression  Successful EUS-guided gastrojejunostomy using a lumen apposing metal stent    10/18/2021 PET scan   IMPRESSION: 1. The known adenocarcinoma involving the distal stomach is hypermetabolic. No definite nodal or hepatic metastases identified. 2. No evidence of distant metastatic disease. 3. Indeterminate left adrenal nodule is unchanged from outside CT and demonstrates no hypermetabolic activity. This measures higher in density than a typical adenoma. Attention on follow-up recommended. 4. Decompressed stomach following gastrojejunostomy. 5. 3.8 cm suprarenal abdominal aortic aneurysm with smaller infrarenal component. Recommend follow-up ultrasound every 2 years. This recommendation follows ACR consensus guidelines: White Paper of the ACR Incidental Findings Committee II on Vascular Findings. J Am Coll Radiol 2013; 10:789-794. Aortic Atherosclerosis (ICD10-I70.0).   11/29/2021 -  Chemotherapy   Patient is on Treatment Plan : GASTROESOPHAGEAL FOLFOX q14d x 6 cycles        INTERVAL HISTORY:  Dillon Smith is here for a follow up of gastric cancer. He was last seen by me on 11/23/21. He was seen in the infusion area. He reports he did well with his first cycle.   All other systems were reviewed with the patient and are  negative.  MEDICAL HISTORY:  Past Medical History:  Diagnosis Date   Bradycardia    CKD (chronic kidney disease), stage III ([place])    Coronary artery disease    CABG 2007   COVID-19 08/2019   Hyperlipidemia LDL goal <70    Hypertension    Persistent atrial fibrillation ([place])    Prostate cancer ([place]) 2010   PVC's (premature ventricular contractions)    Stroke (cerebrum) ([place])     SURGICAL HISTORY: Past Surgical History:  Procedure Laterality Date   COLONOSCOPY  2004   ESOPHAGOGASTRODUODENOSCOPY (EGD) WITH PROPOFOL N/A 11/22/2021   Procedure: ESOPHAGOGASTRODUODENOSCOPY (EGD) WITH PROPOFOL;  Surgeon: Carol Ada, MD;  Location: [facility];  Service: Gastroenterology;  Laterality: N/A;   EUS N/A 11/22/2021   Procedure: UPPER ENDOSCOPIC ULTRASOUND (EUS) RADIAL;  Surgeon: Carol Ada, MD;  Location: [facility];  Service: Gastroenterology;  Laterality: N/A;   INSERTION PROSTATE RADIATION SEED  2010   IR IMAGING GUIDED PORT INSERTION  11/28/2021   stent      I have reviewed the social history and family history with the patient and they are unchanged from previous note.  ALLERGIES:  is allergic to penicillins.  MEDICATIONS:  Current Outpatient Medications  Medication Sig Dispense Refill   amLODipine (NORVASC) 10 MG tablet Take 10 mg by mouth daily.     apixaban (ELIQUIS) 5 MG TABS tablet Take 2.5 mg by mouth 2 (two) times daily.     atorvastatin (LIPITOR) 40 MG tablet  Take 1 tablet (40 mg total) by mouth at bedtime. 30 tablet 2   lidocaine-prilocaine (EMLA) cream Apply to affected area once 30 g 3   Multiple Vitamin (MULTI VITAMIN DAILY PO) Take 1 tablet by mouth daily.     ondansetron (ZOFRAN) 8 MG tablet Take 1 tablet (8 mg total) by mouth 2 (two) times daily as needed for refractory nausea / vomiting. Start on day 3 after chemotherapy. 30 tablet 1   prochlorperazine (COMPAZINE) 10 MG tablet Take 1 tablet (10 mg total) by mouth every 6 (six) hours as needed (Nausea or  vomiting). 30 tablet 1   No current facility-administered medications for this visit.   Facility-Administered Medications Ordered in Other Visits  Medication Dose Route Frequency Provider Last Rate Last Admin   dexamethasone (DECADRON) 10 mg in sodium chloride 0.9 % 50 mL IVPB  10 mg Intravenous Once [name], MD       dextrose 5 % solution   Intravenous Once [name], MD       fluorouracil (ADRUCIL) 4,250 mg in sodium chloride 0.9 % 65 mL chemo infusion  2,000 mg/m2 (Treatment Plan Recorded) Intravenous 1 day or 1 dose [name], MD       leucovorin 848 mg in dextrose 5 % 250 mL infusion  400 mg/m2 (Treatment Plan Recorded) Intravenous Once [name], MD       oxaliplatin (ELOXATIN) 150 mg in dextrose 5 % 500 mL chemo infusion  70 mg/m2 (Treatment Plan Recorded) Intravenous Once [name], MD       palonosetron (ALOXI) injection 0.25 mg  0.25 mg Intravenous Once [name], MD        PHYSICAL EXAMINATION: ECOG PERFORMANCE STATUS: 1 - Symptomatic but completely ambulatory  There were no vitals filed for this visit. Wt Readings from Last 3 Encounters:  12/13/21 191 lb 1.9 oz (86.7 kg)  11/29/21 193 lb 12 oz (87.9 kg)  11/28/21 194 lb (88 kg)     GENERAL:alert, no distress and comfortable SKIN: skin color, texture, turgor are normal, no rashes or significant lesions EYES: normal, Conjunctiva are pink and non-injected, sclera clear  LUNGS: clear to auscultation and percussion with normal breathing effort HEART: regular rate & rhythm and no murmurs and no lower extremity edema NEURO: alert & oriented x 3 with fluent speech, no focal motor/sensory deficits  LABORATORY DATA:  I have reviewed the data as listed    Latest Ref Rng & Units 12/13/2021    8:27 AM 11/23/2021    9:05 AM 10/29/2021   12:13 PM  CBC  WBC 4.0 - 10.5 K/uL 4.7  4.6  5.5   Hemoglobin 13.0 - 17.0 g/dL 11.3  12.6  12.1   Hematocrit 39.0 - 52.0 % 33.7  38.3  37.1   Platelets 150 - 400 K/uL 177  187  214          Latest Ref Rng & Units 12/13/2021    8:27 AM 11/23/2021    9:05 AM 10/29/2021   12:13 PM  CMP  Glucose 70 - 99 mg/dL 120  94  106   BUN 8 - 23 mg/dL _0 Creatinine 0.61 - 1.24 mg/dL 1.57  1.46  1.40   Sodium 135 - 145 mmol/L 142  140  141   Potassium 3.5 - 5.1 mmol/L 3.9  4.0  4.4   Chloride 98 - 111 mmol/L 109  106  108   CO2 22 - 32 mmol/L 27  28  28   Calcium 8.9 - 10.3 mg/dL 9.5  9.7  9.5   Total Protein 6.5 - 8.1 g/dL 6.9  7.1  7.1   Total Bilirubin 0.3 - 1.2 mg/dL 0.5  0.4  0.4   Alkaline Phos 38 - 126 U/L 80  96  84   AST 15 - 41 U/L _0 ALT 0 - 44 U/L _1 RADIOGRAPHIC STUDIES: I have personally reviewed the radiological images as listed and agreed with the findings in the report. No results found.    No orders of the defined types were placed in this encounter.  All questions were answered. The patient knows to call the clinic with any problems, questions or concerns. No barriers to learning was detected. The total time spent in the appointment was 30 minutes.     [name], MD 12/13/2021   I, Wilburn Mylar, am acting as scribe for [name], MD.   I have reviewed the above documentation for accuracy and completeness, and I agree with the above.

## 2021-12-13 NOTE — Progress Notes (Signed)
Per [name] ok to treat with creatinine of 1.57 today.

## 2021-12-14 ENCOUNTER — Ambulatory Visit: Payer: Medicare Other

## 2021-12-15 ENCOUNTER — Inpatient Hospital Stay: Payer: Medicare Other

## 2021-12-15 ENCOUNTER — Other Ambulatory Visit: Payer: Self-pay

## 2021-12-15 VITALS — BP 138/73 | HR 78 | Temp 97.9°F

## 2021-12-15 DIAGNOSIS — Z5111 Encounter for antineoplastic chemotherapy: Secondary | ICD-10-CM | POA: Diagnosis not present

## 2021-12-15 MED ORDER — SODIUM CHLORIDE 0.9% FLUSH
10.0000 mL | INTRAVENOUS | Status: DC | PRN
  Administered 2021-12-15: 10 mL

## 2021-12-15 MED ORDER — HEPARIN SOD (PORK) LOCK FLUSH 100 UNIT/ML IV SOLN
500.0000 [IU] | Freq: Once | INTRAVENOUS | Status: AC | PRN
  Administered 2021-12-15: 500 [IU]

## 2021-12-17 ENCOUNTER — Ambulatory Visit: Payer: Medicare Other

## 2021-12-17 ENCOUNTER — Ambulatory Visit: Payer: Medicare Other | Admitting: Nurse Practitioner

## 2021-12-17 ENCOUNTER — Other Ambulatory Visit: Payer: Medicare Other

## 2021-12-18 ENCOUNTER — Ambulatory Visit: Payer: Medicare Other

## 2021-12-19 ENCOUNTER — Ambulatory Visit: Payer: Medicare Other

## 2021-12-20 ENCOUNTER — Ambulatory Visit: Payer: Medicare Other

## 2021-12-21 ENCOUNTER — Ambulatory Visit: Payer: Medicare Other

## 2021-12-22 ENCOUNTER — Encounter (HOSPITAL_COMMUNITY): Payer: Self-pay

## 2021-12-22 ENCOUNTER — Emergency Department (HOSPITAL_COMMUNITY): Payer: No Typology Code available for payment source

## 2021-12-22 ENCOUNTER — Other Ambulatory Visit: Payer: Self-pay

## 2021-12-22 ENCOUNTER — Emergency Department (HOSPITAL_COMMUNITY)
Admission: EM | Admit: 2021-12-22 | Discharge: 2021-12-22 | Disposition: A | Discharge status: Home / Self Care | Payer: No Typology Code available for payment source | Attending: Emergency Medicine | Admitting: Emergency Medicine

## 2021-12-22 DIAGNOSIS — I2510 Atherosclerotic heart disease of native coronary artery without angina pectoris: Secondary | ICD-10-CM | POA: Insufficient documentation

## 2021-12-22 DIAGNOSIS — Z79899 Other long term (current) drug therapy: Secondary | ICD-10-CM | POA: Diagnosis not present

## 2021-12-22 DIAGNOSIS — N189 Chronic kidney disease, unspecified: Secondary | ICD-10-CM | POA: Insufficient documentation

## 2021-12-22 DIAGNOSIS — R112 Nausea with vomiting, unspecified: Secondary | ICD-10-CM | POA: Insufficient documentation

## 2021-12-22 DIAGNOSIS — K5900 Constipation, unspecified: Secondary | ICD-10-CM | POA: Diagnosis present

## 2021-12-22 DIAGNOSIS — I129 Hypertensive chronic kidney disease with stage 1 through stage 4 chronic kidney disease, or unspecified chronic kidney disease: Secondary | ICD-10-CM | POA: Insufficient documentation

## 2021-12-22 DIAGNOSIS — D72829 Elevated white blood cell count, unspecified: Secondary | ICD-10-CM | POA: Diagnosis not present

## 2021-12-22 DIAGNOSIS — Z7901 Long term (current) use of anticoagulants: Secondary | ICD-10-CM | POA: Insufficient documentation

## 2021-12-22 DIAGNOSIS — Z85028 Personal history of other malignant neoplasm of stomach: Secondary | ICD-10-CM | POA: Diagnosis not present

## 2021-12-22 LAB — CBC WITH DIFFERENTIAL/PLATELET
Abs Immature Granulocytes: 0.05 10*3/uL (ref 0.00–0.07)
Basophils Absolute: 0 10*3/uL (ref 0.0–0.1)
Basophils Relative: 0 %
Eosinophils Absolute: 0 10*3/uL (ref 0.0–0.5)
Eosinophils Relative: 0 %
HCT: 35.1 % — ABNORMAL LOW (ref 39.0–52.0)
Hemoglobin: 11.7 g/dL — ABNORMAL LOW (ref 13.0–17.0)
Immature Granulocytes: 0 %
Lymphocytes Relative: 6 %
Lymphs Abs: 0.6 10*3/uL — ABNORMAL LOW (ref 0.7–4.0)
MCH: 31.7 pg (ref 26.0–34.0)
MCHC: 33.3 g/dL (ref 30.0–36.0)
MCV: 95.1 fL (ref 80.0–100.0)
Monocytes Absolute: 1 10*3/uL (ref 0.1–1.0)
Monocytes Relative: 9 %
Neutro Abs: 9.5 10*3/uL — ABNORMAL HIGH (ref 1.7–7.7)
Neutrophils Relative %: 85 %
Platelets: 177 10*3/uL (ref 150–400)
RBC: 3.69 MIL/uL — ABNORMAL LOW (ref 4.22–5.81)
RDW: 13.5 % (ref 11.5–15.5)
WBC: 11.3 10*3/uL — ABNORMAL HIGH (ref 4.0–10.5)
nRBC: 0 % (ref 0.0–0.2)

## 2021-12-22 LAB — BASIC METABOLIC PANEL
Anion gap: 8 (ref 5–15)
BUN: 19 mg/dL (ref 8–23)
CO2: 24 mmol/L (ref 22–32)
Calcium: 9.3 mg/dL (ref 8.9–10.3)
Chloride: 107 mmol/L (ref 98–111)
Creatinine, Ser: 1.38 mg/dL — ABNORMAL HIGH (ref 0.61–1.24)
GFR, Estimated: 49 mL/min — ABNORMAL LOW (ref >60)
Glucose, Bld: 127 mg/dL — ABNORMAL HIGH (ref 70–99)
Potassium: 3.8 mmol/L (ref 3.5–5.1)
Sodium: 139 mmol/L (ref 135–145)

## 2021-12-22 LAB — LACTIC ACID, PLASMA: Lactic Acid, Venous: 0.8 mmol/L (ref 0.5–1.9)

## 2021-12-22 MED ORDER — POLYETHYLENE GLYCOL 3350 17 GM/SCOOP PO POWD: 1.0000 | Freq: Once | ORAL | 0 refills | Status: AC

## 2021-12-22 MED ORDER — GLYCERIN (ADULT) 2 G RE SUPP: 1.0000 | RECTAL | 0 refills | Status: DC | PRN

## 2021-12-22 MED ORDER — LACTATED RINGERS IV BOLUS
500.0000 mL | Freq: Once | INTRAVENOUS | Status: AC
  Administered 2021-12-22: 500 mL via INTRAVENOUS

## 2021-12-22 MED ORDER — FLEET ENEMA 7-19 GM/118ML RE ENEM
1.0000 | ENEMA | Freq: Once | RECTAL | Status: AC
  Administered 2021-12-22: 1 via RECTAL
  Filled 2021-12-22: qty 1

## 2021-12-22 MED ORDER — HEPARIN SOD (PORK) LOCK FLUSH 100 UNIT/ML IV SOLN
500.0000 [IU] | Freq: Once | INTRAVENOUS | Status: AC
  Administered 2021-12-22: 500 [IU]
  Filled 2021-12-22: qty 5

## 2021-12-22 MED ORDER — IOHEXOL 300 MG/ML  SOLN
80.0000 mL | Freq: Once | INTRAMUSCULAR | Status: AC | PRN
  Administered 2021-12-22: 80 mL via INTRAVENOUS

## 2021-12-22 MED ORDER — GLYCERIN (LAXATIVE) 2.1 G RE SUPP
1.0000 | Freq: Once | RECTAL | Status: AC
  Administered 2021-12-22: 1 via RECTAL
  Filled 2021-12-22: qty 1

## 2021-12-22 NOTE — ED Triage Notes (Signed)
Pt presents with c/o constipation. Pt reports last bowel movement was on Thursday. Pt reports hx of similar issues in the past. Pt is a cancer patient, last chemo on 6/16.

## 2021-12-24 ENCOUNTER — Ambulatory Visit: Payer: Medicare Other

## 2021-12-25 ENCOUNTER — Encounter: Payer: Self-pay | Admitting: Hematology

## 2021-12-25 NOTE — Progress Notes (Signed)
SGO Misty Stanley verified on [employer] form called to inquire about account balance; etc. She states he received a bill from Radiology. Advised to contact the number on that bill to be sure they have his correct insurance information. She states she did and they advised her to give about 30 days to update. She wanted to know if he had balance with [place]. Advised I am not showing anything on my end, but should he receive a bill, to contact the number on bill to ensure information is correct and to date and has been billed accordingly. She verbalized understanding.  Discussed one-time $1000 [employer] grant to assist with personal expenses while going through treatment. Advised what is needed to apply and to provide information to registration desk and they will scan and email to me and provide grant paperwork to sign. She verbalized understanding.  She has my card for any additional financial questions or concerns.

## 2021-12-26 ENCOUNTER — Other Ambulatory Visit: Payer: Self-pay | Admitting: Hematology

## 2021-12-26 DIAGNOSIS — C163 Malignant neoplasm of pyloric antrum: Secondary | ICD-10-CM

## 2021-12-26 MED FILL — Dexamethasone Sodium Phosphate Inj 100 MG/10ML: INTRAMUSCULAR | Qty: 1 | Status: AC

## 2021-12-27 ENCOUNTER — Inpatient Hospital Stay: Payer: Medicare Other

## 2021-12-27 ENCOUNTER — Other Ambulatory Visit: Payer: Self-pay

## 2021-12-27 ENCOUNTER — Inpatient Hospital Stay (HOSPITAL_BASED_OUTPATIENT_CLINIC_OR_DEPARTMENT_OTHER): Payer: Medicare Other | Admitting: Hematology

## 2021-12-27 ENCOUNTER — Ambulatory Visit: Payer: Medicare Other

## 2021-12-27 ENCOUNTER — Other Ambulatory Visit: Payer: Medicare Other

## 2021-12-27 ENCOUNTER — Inpatient Hospital Stay: Payer: Medicare Other | Admitting: Nutrition

## 2021-12-27 ENCOUNTER — Ambulatory Visit: Payer: Medicare Other | Admitting: Hematology

## 2021-12-27 VITALS — BP 131/85 | HR 57 | Temp 98.0°F | Resp 18 | Wt 187.2 lb

## 2021-12-27 DIAGNOSIS — Z5111 Encounter for antineoplastic chemotherapy: Secondary | ICD-10-CM | POA: Diagnosis not present

## 2021-12-27 DIAGNOSIS — Z95828 Presence of other vascular implants and grafts: Secondary | ICD-10-CM

## 2021-12-27 DIAGNOSIS — C163 Malignant neoplasm of pyloric antrum: Secondary | ICD-10-CM

## 2021-12-27 LAB — COMPREHENSIVE METABOLIC PANEL
ALT: 77 U/L — ABNORMAL HIGH (ref 0–44)
AST: 60 U/L — ABNORMAL HIGH (ref 15–41)
Albumin: 3.2 g/dL — ABNORMAL LOW (ref 3.5–5.0)
Alkaline Phosphatase: 82 U/L (ref 38–126)
Anion gap: 8 (ref 5–15)
BUN: 20 mg/dL (ref 8–23)
CO2: 25 mmol/L (ref 22–32)
Calcium: 9 mg/dL (ref 8.9–10.3)
Chloride: 100 mmol/L (ref 98–111)
Creatinine, Ser: 1.5 mg/dL — ABNORMAL HIGH (ref 0.61–1.24)
GFR, Estimated: 44 mL/min — ABNORMAL LOW (ref >60)
Glucose, Bld: 138 mg/dL — ABNORMAL HIGH (ref 70–99)
Potassium: 3.9 mmol/L (ref 3.5–5.1)
Sodium: 133 mmol/L — ABNORMAL LOW (ref 135–145)
Total Bilirubin: 0.5 mg/dL (ref 0.3–1.2)
Total Protein: 6.8 g/dL (ref 6.5–8.1)

## 2021-12-27 LAB — CBC WITH DIFFERENTIAL/PLATELET
Abs Immature Granulocytes: 0.06 10*3/uL (ref 0.00–0.07)
Basophils Absolute: 0 10*3/uL (ref 0.0–0.1)
Basophils Relative: 0 %
Eosinophils Absolute: 0 10*3/uL (ref 0.0–0.5)
Eosinophils Relative: 0 %
HCT: 34.7 % — ABNORMAL LOW (ref 39.0–52.0)
Hemoglobin: 12 g/dL — ABNORMAL LOW (ref 13.0–17.0)
Immature Granulocytes: 1 %
Lymphocytes Relative: 8 %
Lymphs Abs: 0.6 10*3/uL — ABNORMAL LOW (ref 0.7–4.0)
MCH: 31.6 pg (ref 26.0–34.0)
MCHC: 34.6 g/dL (ref 30.0–36.0)
MCV: 91.3 fL (ref 80.0–100.0)
Monocytes Absolute: 0.9 10*3/uL (ref 0.1–1.0)
Monocytes Relative: 13 %
Neutro Abs: 5.8 10*3/uL (ref 1.7–7.7)
Neutrophils Relative %: 78 %
Platelets: 185 10*3/uL (ref 150–400)
RBC: 3.8 MIL/uL — ABNORMAL LOW (ref 4.22–5.81)
RDW: 13.4 % (ref 11.5–15.5)
WBC: 7.4 10*3/uL (ref 4.0–10.5)
nRBC: 0 % (ref 0.0–0.2)

## 2021-12-27 LAB — CEA (IN HOUSE-CHCC): CEA (CHCC-In House): 4.03 ng/mL (ref 0.00–5.00)

## 2021-12-27 MED ORDER — LEUCOVORIN CALCIUM INJECTION 350 MG
400.0000 mg/m2 | Freq: Once | INTRAVENOUS | Status: AC
  Administered 2021-12-27: 848 mg via INTRAVENOUS
  Filled 2021-12-27: qty 42.4

## 2021-12-27 MED ORDER — SODIUM CHLORIDE 0.9 % IV SOLN
10.0000 mg | Freq: Once | INTRAVENOUS | Status: AC
  Administered 2021-12-27: 10 mg via INTRAVENOUS
  Filled 2021-12-27: qty 10

## 2021-12-27 MED ORDER — DEXTROSE 5 % IV SOLN: Freq: Once | INTRAVENOUS | Status: AC

## 2021-12-27 MED ORDER — SODIUM CHLORIDE 0.9% FLUSH
10.0000 mL | Freq: Once | INTRAVENOUS | Status: AC
  Administered 2021-12-27: 10 mL

## 2021-12-27 MED ORDER — OXALIPLATIN CHEMO INJECTION 100 MG/20ML
55.0000 mg/m2 | Freq: Once | INTRAVENOUS | Status: AC
  Administered 2021-12-27: 115 mg via INTRAVENOUS
  Filled 2021-12-27: qty 23

## 2021-12-27 MED ORDER — SODIUM CHLORIDE 0.9 % IV SOLN
2000.0000 mg/m2 | INTRAVENOUS | Status: DC
  Administered 2021-12-27: 4250 mg via INTRAVENOUS
  Filled 2021-12-27: qty 85

## 2021-12-27 MED ORDER — OXALIPLATIN CHEMO INJECTION 100 MG/20ML
70.0000 mg/m2 | Freq: Once | INTRAVENOUS | Status: DC
  Filled 2021-12-27: qty 30

## 2021-12-27 MED ORDER — PALONOSETRON HCL INJECTION 0.25 MG/5ML
0.2500 mg | Freq: Once | INTRAVENOUS | Status: AC
  Administered 2021-12-27: 0.25 mg via INTRAVENOUS
  Filled 2021-12-27: qty 5

## 2021-12-27 NOTE — Patient Instructions (Signed)
[facility]  Discharge Instructions: Thank you for choosing [facility] to provide your oncology and hematology care.   If you have a lab appointment with the [facility], please go directly to the [facility] and check in at the registration area.   Wear comfortable clothing and clothing appropriate for easy access to any Portacath or PICC line.   We strive to give you quality time with your provider. You may need to reschedule your appointment if you arrive late (15 or more minutes).  Arriving late affects you and other patients whose appointments are after yours.  Also, if you miss three or more appointments without notifying the office, you may be dismissed from the clinic at the provider's discretion.      For prescription refill requests, have your pharmacy contact our office and allow 72 hours for refills to be completed.    Today you received the following chemotherapy and/or immunotherapy agents : Oxaliplatin, leucovorin, 5FU       To help prevent nausea and vomiting after your treatment, we encourage you to take your nausea medication as directed.  BELOW ARE SYMPTOMS THAT SHOULD BE REPORTED IMMEDIATELY: *FEVER GREATER THAN 100.4 F (38 C) OR HIGHER *CHILLS OR SWEATING *NAUSEA AND VOMITING THAT IS NOT CONTROLLED WITH YOUR NAUSEA MEDICATION *UNUSUAL SHORTNESS OF BREATH *UNUSUAL BRUISING OR BLEEDING *URINARY PROBLEMS (pain or burning when urinating, or frequent urination) *BOWEL PROBLEMS (unusual diarrhea, constipation, pain near the anus) TENDERNESS IN MOUTH AND THROAT WITH OR WITHOUT PRESENCE OF ULCERS (sore throat, sores in mouth, or a toothache) UNUSUAL RASH, SWELLING OR PAIN  UNUSUAL VAGINAL DISCHARGE OR ITCHING   Items with * indicate a potential emergency and should be followed up as soon as possible or go to the Emergency Department if any problems should occur.  Please show the CHEMOTHERAPY ALERT CARD or IMMUNOTHERAPY  ALERT CARD at check-in to the Emergency Department and triage nurse.  Should you have questions after your visit or need to cancel or reschedule your appointment, please contact [facility]  Dept: [phone]  and follow the prompts.  Office hours are 8:00 a.m. to 4:30 p.m. Monday - Friday. Please note that voicemails left after 4:00 p.m. may not be returned until the following business day.  We are closed weekends and major holidays. You have access to a nurse at all times for urgent questions. Please call the main number to the clinic Dept: [phone] and follow the prompts.   For any non-urgent questions, you may also contact your provider using MyChart. We now offer e-Visits for anyone 86 and older to request care online for non-urgent symptoms. For details visit [email or web address].   Also download the MyChart app! Go to the app store, search "MyChart", open the app, select , and log in with your MyChart username and password.  Masks are optional in the cancer centers. If you would like for your care team to wear a mask while they are taking care of you, please let them know. For doctor visits, patients may have with them one support person who is at least 86 years old. At this time, visitors are not allowed in the infusion area.

## 2021-12-27 NOTE — Progress Notes (Addendum)
[place]   [phone] [phone]   Clinic Follow up Note   Patient Care Team: [facility], [name] as PCP - General [name], MD as PCP - Cardiology (Cardiology) Thompson Grayer, MD as PCP - Electrophysiology (Cardiology) [name], MD as Consulting Physician (Radiation Oncology) [name], RN as Registered Nurse [name], MD as Consulting Physician (Hematology) [name], NP as Nurse Practitioner (Nurse Practitioner)  Date of Service:  12/27/2021  CHIEF COMPLAINT: f/u of gastric cancer  CURRENT THERAPY:  Neoadjuvant FOLFOX, q14d, started 11/29/21  ASSESSMENT & PLAN:  Dillon Smith is a 88 y.o. male with   1. Adenocarcinoma of the pylorus/distal stomach, cT3N0M0, stage IIB, MMR normal, PD-L1 CPS 2% -presented with reported 30 pound weight loss over a year, and 2-week history of GERD, constipation, and regurgitation. CT AP w/o contrast at the [facility] on 09/12/21 showed pyloric thickening. Biopsy from pyloric channel obtained during EGD on 09/14/21 confirmed adenocarcinoma, MMR was normal. Baseline CEA and CA19.9 normal  -Due to gastric outlet obstruction he underwent EGD/EUS with pylori stent placement on 09/21/21 and his symptoms completely resolved  -He was referred from [place] to [name] for palliative radiation. Staging CT 09/21/21 and PET scan 10/18/21 were negative for lymphadenopathy or distant metastasis -staging EUS 11/22/21 showed T3 N0 disease  -He has met general surgeon Dr. [name], who thinks he is a candidate for surgical resection. -he was started on neoadjuvant FOLFOX on 11/29/21. He tolerates well overall with taste changes. -labs reviewed, overall stable and expected on treatment. Despite his decreased appetite and weight loss, he reports he feels well. I will reduce his oxali dose today. If he is able to eat better and gain weight back next time. Will increase his dose back again  -his recent CT from ED visit showed  stable disease, I reviewed with him   2. Chemo Toxicities: Loss of Taste -he is not eating well due to taste loss. We gave him an Ensure today and advised him to use at home. -Follow-up with dietitian   3. GERD, constipation, regurgitation, gastric outlet obstruction, weight loss -Secondary to #1 -much improved with gastrojejunostomy -he is having worsening constipation, requiring enema in ED on 12/22/21. He was prescribed an enema while in the ED; I encouraged him to pick this up and use it today to achieve a bowel movement. I also advised him to increase his miralax dose.   4. Comorbidities: HTN, HL, A-fib, history of stroke, prostate cancer 2004 s/p brachytherapy, CKD stage III  -Currently on amlodipine, apixaban, and atorvastatin. Denies bleeding on anticoagulation  -He has no residual deficits from previous stroke -His chronic conditions appear to be well controlled -Last PSA was normal per patient, checked at the [place] -He has CKD and mild anemia, will monitor closely on chemo.    4. Social -Patient is single with 2 adult daughters who live out of state.  He is a high functioning gentleman who lives alone and is independent with ADLs including driving -His neighbor/friend checks on him and helps with medication -His niece brings him to his appointments and helps care for him.   5. Goals of care -he is full code      PLAN: -proceed with C3 FOLFOX today with reduced oxali dose to 7m/m2 due to constipation and taste change etc  -pick up enema from pharmacy and increase miralax dose -lab, flush, f/u, and FOLFOX every 2 weeks             -  he prefers very early morning appointments -I called and spoke with his niece after his visit  -nutrition consult and start taking Ensure 3 bottles a day    No problem-specific Assessment & Plan notes found for this encounter.   SUMMARY OF ONCOLOGIC HISTORY: Oncology History  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[place])  09/14/2021 Initial Biopsy   A.  pyloric channel biopsy -Adenocarcinoma involving superficial gastric mucosa Entheses the biopsy is superficial and the depth of invasion cannot be accurately determined) -Reactive gastropathy and benign duodenal mucosa  B. gastric biopsy -Chronic active gastritis -Immunostain for H. pylori is negative  MMR normal    09/14/2021 Initial Diagnosis   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[place])   09/14/2021 Cancer Staging   Staging form: Stomach, AJCC 8th Edition - Clinical stage from 09/14/2021: Stage IIB (cT3, cN0, cM0) - Signed by [name], MD on 11/23/2021 Stage prefix: Initial diagnosis Total positive nodes: 0   09/16/2021 Imaging   CT AP w contrast  1.  Findings in keeping with gastric outlet obstruction from circumferential gastric antral mass lesion. Recommend correlation with EGD and biopsy.  2.  Indeterminate complex enhancing left adrenal mass. The degree of heterogeneity and enhancement would be atypical for adenoma. Metastatic disease or pheochromocytoma are therefore considerations.  3.  Bilobed infrarenal abdominal aortic aneurysm measuring up to 3.4 cm with right common iliac artery aneurysm.  4.  Small left pleural effusion.   09/21/2021 Procedure   EUS/EGD Findings  EGD: The esophagus was normal. The gastric body and antrum appeared  normal. Evidence of tumor was seen at the pylorus, and extending into the  pyloric channel and the duodenal bulb resulting in severe luminal  narrowing.   EUS was then performed. FNA was performed.   Impression  Successful EUS-guided gastrojejunostomy using a lumen apposing metal stent    10/18/2021 PET scan   IMPRESSION: 1. The known adenocarcinoma involving the distal stomach is hypermetabolic. No definite nodal or hepatic metastases identified. 2. No evidence of distant metastatic disease. 3. Indeterminate left adrenal nodule is unchanged from outside CT and demonstrates no hypermetabolic activity. This measures higher in density than a typical  adenoma. Attention on follow-up recommended. 4. Decompressed stomach following gastrojejunostomy. 5. 3.8 cm suprarenal abdominal aortic aneurysm with smaller infrarenal component. Recommend follow-up ultrasound every 2 years. This recommendation follows ACR consensus guidelines: White Paper of the ACR Incidental Findings Committee II on Vascular Findings. J Am Coll Radiol 2013; 10:789-794. Aortic Atherosclerosis (ICD10-I70.0).   11/29/2021 -  Chemotherapy   Patient is on Treatment Plan : GASTROESOPHAGEAL FOLFOX q14d x 6 cycles        INTERVAL HISTORY:  Dillon Smith is here for a follow up of gastric cancer. He was last seen by me on 12/13/21. He was seen in the infusion area. He reports he has not had a bowel movement since his ED visit.  He also reports he is not eating well due to loss of taste. He notes he tries to eat but isn't successful.    All other systems were reviewed with the patient and are negative.  MEDICAL HISTORY:  Past Medical History:  Diagnosis Date   Bradycardia    CKD (chronic kidney disease), stage III ([place])    Coronary artery disease    CABG 2007   COVID-19 08/2019   Hyperlipidemia LDL goal <70    Hypertension    Persistent atrial fibrillation ([place])    Prostate cancer ([place]) 2010   PVC's (premature ventricular contractions)  Stroke (cerebrum) [facility])     SURGICAL HISTORY: Past Surgical History:  Procedure Laterality Date   COLONOSCOPY  2004   ESOPHAGOGASTRODUODENOSCOPY (EGD) WITH PROPOFOL N/A 11/22/2021   Procedure: ESOPHAGOGASTRODUODENOSCOPY (EGD) WITH PROPOFOL;  Surgeon: Carol Ada, MD;  Location: [facility] ENDOSCOPY;  Service: Gastroenterology;  Laterality: N/A;   EUS N/A 11/22/2021   Procedure: UPPER ENDOSCOPIC ULTRASOUND (EUS) RADIAL;  Surgeon: Carol Ada, MD;  Location: [facility] ENDOSCOPY;  Service: Gastroenterology;  Laterality: N/A;   INSERTION PROSTATE RADIATION SEED  2010   IR IMAGING GUIDED PORT INSERTION  11/28/2021   stent      I have reviewed  the social history and family history with the patient and they are unchanged from previous note.  ALLERGIES:  is allergic to penicillins.  MEDICATIONS:  Current Outpatient Medications  Medication Sig Dispense Refill   amLODipine (NORVASC) 10 MG tablet Take 10 mg by mouth daily.     apixaban (ELIQUIS) 5 MG TABS tablet Take 2.5 mg by mouth 2 (two) times daily.     atorvastatin (LIPITOR) 40 MG tablet Take 1 tablet (40 mg total) by mouth at bedtime. 30 tablet 2   glycerin adult 2 g suppository Place 1 suppository rectally as needed for constipation. 12 suppository 0   lidocaine-prilocaine (EMLA) cream Apply to affected area once 30 g 3   Multiple Vitamin (MULTI VITAMIN DAILY PO) Take 1 tablet by mouth daily.     ondansetron (ZOFRAN) 8 MG tablet Take 1 tablet (8 mg total) by mouth 2 (two) times daily as needed for refractory nausea / vomiting. Start on day 3 after chemotherapy. 30 tablet 1   prochlorperazine (COMPAZINE) 10 MG tablet Take 1 tablet (10 mg total) by mouth every 6 (six) hours as needed (Nausea or vomiting). 30 tablet 1   No current facility-administered medications for this visit.   Facility-Administered Medications Ordered in Other Visits  Medication Dose Route Frequency Provider Last Rate Last Admin   fluorouracil (ADRUCIL) 4,250 mg in sodium chloride 0.9 % 65 mL chemo infusion  2,000 mg/m2 (Treatment Plan Recorded) Intravenous 1 day or 1 dose [name], MD       leucovorin 848 mg in dextrose 5 % 250 mL infusion  400 mg/m2 (Treatment Plan Recorded) Intravenous Once [name], MD 146 mL/hr at 12/27/21 1051 848 mg at 12/27/21 1051   oxaliplatin (ELOXATIN) 115 mg in dextrose 5 % 500 mL chemo infusion  55 mg/m2 (Treatment Plan Recorded) Intravenous Once [name], MD 262 mL/hr at 12/27/21 1055 115 mg at 12/27/21 1055    PHYSICAL EXAMINATION: ECOG PERFORMANCE STATUS: 1 - Symptomatic but completely ambulatory  There were no vitals filed for this visit. Wt Readings from Last 3  Encounters:  12/27/21 187 lb 4 oz (84.9 kg)  12/13/21 191 lb 1.9 oz (86.7 kg)  11/29/21 193 lb 12 oz (87.9 kg)     GENERAL:alert, no distress and comfortable SKIN: skin color normal, no rashes or significant lesions EYES: normal, Conjunctiva are pink and non-injected, sclera clear  NEURO: alert & oriented x 3 with fluent speech  LABORATORY DATA:  I have reviewed the data as listed    Latest Ref Rng & Units 12/27/2021    8:24 AM 12/22/2021    6:14 PM 12/13/2021    8:27 AM  CBC  WBC 4.0 - 10.5 K/uL 7.4  11.3  4.7   Hemoglobin 13.0 - 17.0 g/dL 12.0  11.7  11.3   Hematocrit 39.0 - 52.0 % 34.7  35.1  33.7  Platelets 150 - 400 K/uL 185  177  177         Latest Ref Rng & Units 12/27/2021    8:24 AM 12/22/2021    6:14 PM 12/13/2021    8:27 AM  CMP  Glucose 70 - 99 mg/dL 138  127  120   BUN 8 - 23 mg/dL 20  19  21    Creatinine 0.61 - 1.24 mg/dL 1.50  1.38  1.57   Sodium 135 - 145 mmol/L 133  139  142   Potassium 3.5 - 5.1 mmol/L 3.9  3.8  3.9   Chloride 98 - 111 mmol/L 100  107  109   CO2 22 - 32 mmol/L 25  24  27    Calcium 8.9 - 10.3 mg/dL 9.0  9.3  9.5   Total Protein 6.5 - 8.1 g/dL 6.8   6.9   Total Bilirubin 0.3 - 1.2 mg/dL 0.5   0.5   Alkaline Phos 38 - 126 U/L 82   80   AST 15 - 41 U/L 60   18   ALT 0 - 44 U/L 77   17       RADIOGRAPHIC STUDIES: I have personally reviewed the radiological images as listed and agreed with the findings in the report. No results found.    No orders of the defined types were placed in this encounter.  All questions were answered. The patient knows to call the clinic with any problems, questions or concerns. No barriers to learning was detected. The total time spent in the appointment was 30 minutes.     [name], MD 12/27/2021   I, Wilburn Mylar, am acting as scribe for [name], MD.   I have reviewed the above documentation for accuracy and completeness, and I agree with the above.

## 2021-12-27 NOTE — Progress Notes (Signed)
RN requested nutrition consult secondary to weight loss.  88 year old male diagnosed with gastric cancer status post gastrojejunostomy. He is followed by [name]. He is receiving cycle 3 of FOLFOX today.  Past medical history includes hypertension, A-fib, stroke, prostate cancer, chronic kidney disease stage III, CAD, CABG, COVID-19.  Medications include multivitamin, Zofran, and Compazine.  Labs include glucose 138, creatinine 1.5, sodium 133.  Height: 5 feet 11 inches. Weight: 187 pounds 4 ounces. Patient weighed 193 pounds 12 ounces on June 1.  He reports weighing 243 pounds about 1-1/2 years ago. BMI: 26.12.  Noted patient is a candidate for surgical resection. Patient reports he was experiencing taste alterations but he thinks this is improving. He has suffered from constipation and because of that has lost his appetite. Denies nausea, vomiting, and diarrhea. Reports he tried Ensure today and he liked it.  He is willing to drink supplements at home.  Nutrition diagnosis: Unintended weight loss related to poor appetite as evidenced by 3% weight loss over 1 month which is not significant but is concerning.  Intervention: Educated on importance of smaller more frequent meals and snacks with adequate calories and protein. Encourage patient to increase fluid intake. Encouraged bowel regimen as described by MD. Brief education provided on adding fiber to his diet after constipation has resolved. Recommended patient drink Ensure Plus or equivalent, 1 carton twice daily. Provided samples and coupons. Questions answered.  Contact information provided.  Monitoring, evaluation, goals: Patient will increase calories and protein to minimize weight loss.  He will adhere to bowel regimen to avoid constipation.  Next visit: Thursday, July 27 with Vinnie Level during infusion.  **Disclaimer: This note was dictated with voice recognition software. Similar sounding words can inadvertently be  transcribed and this note may contain transcription errors which may not have been corrected upon publication of note.**

## 2021-12-27 NOTE — Progress Notes (Signed)
Received VO from [name] to run patient pump over 44 hours so he can go to a family reunion on Saturday. Pump running at 3.4 ml/hr. Patient aware of 9am appt time for pump DC on Sat

## 2021-12-28 ENCOUNTER — Encounter: Payer: Self-pay | Admitting: Hematology

## 2021-12-28 NOTE — Progress Notes (Signed)
Patient's sgo Dillon Smith left a Advertising account executive.  Called and left her a voicemail.  She returned my call regarding income documentation needed for [employer]. Advised she may bring at his next visit 7/13 and he will be given grant paperwork to sign and given a copy of grant and expense sheet. Advised to then call me after appointment to discuss grant in detail. She verbalized understanding.  She has my card for any additional financial questions or concerns.

## 2021-12-29 ENCOUNTER — Inpatient Hospital Stay: Payer: Medicare Other | Attending: Nurse Practitioner

## 2021-12-29 VITALS — BP 122/57 | HR 82 | Temp 97.8°F | Resp 16

## 2021-12-29 DIAGNOSIS — C163 Malignant neoplasm of pyloric antrum: Secondary | ICD-10-CM

## 2021-12-29 MED ORDER — HEPARIN SOD (PORK) LOCK FLUSH 100 UNIT/ML IV SOLN
500.0000 [IU] | Freq: Once | INTRAVENOUS | Status: AC | PRN
  Administered 2021-12-29: 500 [IU]

## 2021-12-29 MED ORDER — SODIUM CHLORIDE 0.9% FLUSH
10.0000 mL | INTRAVENOUS | Status: DC | PRN
  Administered 2021-12-29: 10 mL

## 2021-12-31 ENCOUNTER — Emergency Department (HOSPITAL_COMMUNITY): Payer: Medicare Other

## 2021-12-31 ENCOUNTER — Encounter (HOSPITAL_COMMUNITY): Admission: EM | Disposition: A | Discharge status: Skilled Nursing Facility | Payer: Self-pay | Source: Home / Self Care

## 2021-12-31 ENCOUNTER — Inpatient Hospital Stay (HOSPITAL_COMMUNITY)
Admission: EM | Admit: 2021-12-31 | Discharge: 2022-01-22 | DRG: 853 | Disposition: A | Discharge status: Skilled Nursing Facility | Payer: Medicare Other | Attending: Internal Medicine | Admitting: Internal Medicine

## 2021-12-31 ENCOUNTER — Inpatient Hospital Stay (HOSPITAL_COMMUNITY): Payer: Medicare Other

## 2021-12-31 ENCOUNTER — Other Ambulatory Visit: Payer: Self-pay

## 2021-12-31 ENCOUNTER — Emergency Department (HOSPITAL_COMMUNITY): Payer: Medicare Other | Admitting: Anesthesiology

## 2021-12-31 ENCOUNTER — Encounter (HOSPITAL_COMMUNITY): Payer: Self-pay

## 2021-12-31 DIAGNOSIS — C189 Malignant neoplasm of colon, unspecified: Secondary | ICD-10-CM | POA: Diagnosis not present

## 2021-12-31 DIAGNOSIS — E782 Mixed hyperlipidemia: Secondary | ICD-10-CM | POA: Diagnosis not present

## 2021-12-31 DIAGNOSIS — C169 Malignant neoplasm of stomach, unspecified: Secondary | ICD-10-CM | POA: Diagnosis not present

## 2021-12-31 DIAGNOSIS — D630 Anemia in neoplastic disease: Secondary | ICD-10-CM | POA: Diagnosis present

## 2021-12-31 DIAGNOSIS — R7989 Other specified abnormal findings of blood chemistry: Secondary | ICD-10-CM | POA: Diagnosis present

## 2021-12-31 DIAGNOSIS — R198 Other specified symptoms and signs involving the digestive system and abdomen: Secondary | ICD-10-CM | POA: Diagnosis not present

## 2021-12-31 DIAGNOSIS — Z8673 Personal history of transient ischemic attack (TIA), and cerebral infarction without residual deficits: Secondary | ICD-10-CM

## 2021-12-31 DIAGNOSIS — C163 Malignant neoplasm of pyloric antrum: Secondary | ICD-10-CM | POA: Diagnosis present

## 2021-12-31 DIAGNOSIS — I5082 Biventricular heart failure: Secondary | ICD-10-CM | POA: Diagnosis present

## 2021-12-31 DIAGNOSIS — Z95828 Presence of other vascular implants and grafts: Secondary | ICD-10-CM

## 2021-12-31 DIAGNOSIS — H9190 Unspecified hearing loss, unspecified ear: Secondary | ICD-10-CM | POA: Diagnosis present

## 2021-12-31 DIAGNOSIS — Z951 Presence of aortocoronary bypass graft: Secondary | ICD-10-CM

## 2021-12-31 DIAGNOSIS — E860 Dehydration: Secondary | ICD-10-CM | POA: Diagnosis not present

## 2021-12-31 DIAGNOSIS — E1165 Type 2 diabetes mellitus with hyperglycemia: Secondary | ICD-10-CM | POA: Diagnosis not present

## 2021-12-31 DIAGNOSIS — G928 Other toxic encephalopathy: Secondary | ICD-10-CM | POA: Diagnosis not present

## 2021-12-31 DIAGNOSIS — I130 Hypertensive heart and chronic kidney disease with heart failure and stage 1 through stage 4 chronic kidney disease, or unspecified chronic kidney disease: Secondary | ICD-10-CM | POA: Diagnosis present

## 2021-12-31 DIAGNOSIS — N179 Acute kidney failure, unspecified: Secondary | ICD-10-CM | POA: Diagnosis present

## 2021-12-31 DIAGNOSIS — Z515 Encounter for palliative care: Secondary | ICD-10-CM | POA: Diagnosis not present

## 2021-12-31 DIAGNOSIS — Z87891 Personal history of nicotine dependence: Secondary | ICD-10-CM

## 2021-12-31 DIAGNOSIS — I4891 Unspecified atrial fibrillation: Secondary | ICD-10-CM | POA: Diagnosis present

## 2021-12-31 DIAGNOSIS — D649 Anemia, unspecified: Secondary | ICD-10-CM | POA: Diagnosis not present

## 2021-12-31 DIAGNOSIS — K255 Chronic or unspecified gastric ulcer with perforation: Secondary | ICD-10-CM | POA: Diagnosis not present

## 2021-12-31 DIAGNOSIS — Z79899 Other long term (current) drug therapy: Secondary | ICD-10-CM

## 2021-12-31 DIAGNOSIS — Z8546 Personal history of malignant neoplasm of prostate: Secondary | ICD-10-CM

## 2021-12-31 DIAGNOSIS — Z880 Allergy status to penicillin: Secondary | ICD-10-CM

## 2021-12-31 DIAGNOSIS — D6481 Anemia due to antineoplastic chemotherapy: Secondary | ICD-10-CM | POA: Diagnosis present

## 2021-12-31 DIAGNOSIS — N183 Chronic kidney disease, stage 3 unspecified: Secondary | ICD-10-CM | POA: Diagnosis not present

## 2021-12-31 DIAGNOSIS — I2510 Atherosclerotic heart disease of native coronary artery without angina pectoris: Secondary | ICD-10-CM

## 2021-12-31 DIAGNOSIS — Z8616 Personal history of COVID-19: Secondary | ICD-10-CM | POA: Diagnosis not present

## 2021-12-31 DIAGNOSIS — I129 Hypertensive chronic kidney disease with stage 1 through stage 4 chronic kidney disease, or unspecified chronic kidney disease: Secondary | ICD-10-CM | POA: Diagnosis not present

## 2021-12-31 DIAGNOSIS — I5043 Acute on chronic combined systolic (congestive) and diastolic (congestive) heart failure: Secondary | ICD-10-CM | POA: Diagnosis not present

## 2021-12-31 DIAGNOSIS — Y838 Other surgical procedures as the cause of abnormal reaction of the patient, or of later complication, without mention of misadventure at the time of the procedure: Secondary | ICD-10-CM | POA: Diagnosis not present

## 2021-12-31 DIAGNOSIS — I5023 Acute on chronic systolic (congestive) heart failure: Secondary | ICD-10-CM

## 2021-12-31 DIAGNOSIS — I4819 Other persistent atrial fibrillation: Secondary | ICD-10-CM | POA: Diagnosis not present

## 2021-12-31 DIAGNOSIS — N1831 Chronic kidney disease, stage 3a: Secondary | ICD-10-CM

## 2021-12-31 DIAGNOSIS — R52 Pain, unspecified: Secondary | ICD-10-CM | POA: Diagnosis not present

## 2021-12-31 DIAGNOSIS — Z66 Do not resuscitate: Secondary | ICD-10-CM | POA: Diagnosis present

## 2021-12-31 DIAGNOSIS — K651 Peritoneal abscess: Secondary | ICD-10-CM | POA: Diagnosis present

## 2021-12-31 DIAGNOSIS — E785 Hyperlipidemia, unspecified: Secondary | ICD-10-CM | POA: Diagnosis present

## 2021-12-31 DIAGNOSIS — E1122 Type 2 diabetes mellitus with diabetic chronic kidney disease: Secondary | ICD-10-CM | POA: Diagnosis present

## 2021-12-31 DIAGNOSIS — I10 Essential (primary) hypertension: Secondary | ICD-10-CM | POA: Diagnosis not present

## 2021-12-31 DIAGNOSIS — S36114A Minor laceration of liver, initial encounter: Secondary | ICD-10-CM | POA: Diagnosis not present

## 2021-12-31 DIAGNOSIS — R34 Anuria and oliguria: Secondary | ICD-10-CM | POA: Diagnosis not present

## 2021-12-31 DIAGNOSIS — D709 Neutropenia, unspecified: Secondary | ICD-10-CM | POA: Diagnosis present

## 2021-12-31 DIAGNOSIS — I4811 Longstanding persistent atrial fibrillation: Secondary | ICD-10-CM | POA: Diagnosis present

## 2021-12-31 DIAGNOSIS — Z7901 Long term (current) use of anticoagulants: Secondary | ICD-10-CM

## 2021-12-31 DIAGNOSIS — R0609 Other forms of dyspnea: Secondary | ICD-10-CM | POA: Diagnosis not present

## 2021-12-31 DIAGNOSIS — K9171 Accidental puncture and laceration of a digestive system organ or structure during a digestive system procedure: Secondary | ICD-10-CM | POA: Diagnosis not present

## 2021-12-31 DIAGNOSIS — L249 Irritant contact dermatitis, unspecified cause: Secondary | ICD-10-CM | POA: Diagnosis present

## 2021-12-31 DIAGNOSIS — Z7189 Other specified counseling: Secondary | ICD-10-CM | POA: Diagnosis not present

## 2021-12-31 DIAGNOSIS — R1013 Epigastric pain: Secondary | ICD-10-CM | POA: Diagnosis present

## 2021-12-31 DIAGNOSIS — E872 Acidosis, unspecified: Secondary | ICD-10-CM | POA: Diagnosis present

## 2021-12-31 DIAGNOSIS — A419 Sepsis, unspecified organism: Secondary | ICD-10-CM | POA: Diagnosis present

## 2021-12-31 DIAGNOSIS — Z781 Physical restraint status: Secondary | ICD-10-CM

## 2021-12-31 DIAGNOSIS — F05 Delirium due to known physiological condition: Secondary | ICD-10-CM | POA: Diagnosis not present

## 2021-12-31 DIAGNOSIS — I351 Nonrheumatic aortic (valve) insufficiency: Secondary | ICD-10-CM | POA: Diagnosis present

## 2021-12-31 DIAGNOSIS — G893 Neoplasm related pain (acute) (chronic): Secondary | ICD-10-CM | POA: Diagnosis not present

## 2021-12-31 DIAGNOSIS — T502X5A Adverse effect of carbonic-anhydrase inhibitors, benzothiadiazides and other diuretics, initial encounter: Secondary | ICD-10-CM | POA: Diagnosis not present

## 2021-12-31 DIAGNOSIS — T451X5A Adverse effect of antineoplastic and immunosuppressive drugs, initial encounter: Secondary | ICD-10-CM | POA: Diagnosis present

## 2021-12-31 DIAGNOSIS — I482 Chronic atrial fibrillation, unspecified: Secondary | ICD-10-CM | POA: Diagnosis not present

## 2021-12-31 DIAGNOSIS — T4145XA Adverse effect of unspecified anesthetic, initial encounter: Secondary | ICD-10-CM | POA: Diagnosis not present

## 2021-12-31 HISTORY — PX: LAPAROTOMY: SHX154

## 2021-12-31 LAB — COMPREHENSIVE METABOLIC PANEL
ALT: 62 U/L — ABNORMAL HIGH (ref 0–44)
ALT: 79 U/L — ABNORMAL HIGH (ref 0–44)
AST: 41 U/L (ref 15–41)
AST: 94 U/L — ABNORMAL HIGH (ref 15–41)
Albumin: 2.9 g/dL — ABNORMAL LOW (ref 3.5–5.0)
Albumin: 2.3 g/dL — ABNORMAL LOW (ref 3.5–5.0)
Alkaline Phosphatase: 102 U/L (ref 38–126)
Alkaline Phosphatase: 56 U/L (ref 38–126)
Anion gap: 11 (ref 5–15)
Anion gap: 10 (ref 5–15)
BUN: 26 mg/dL — ABNORMAL HIGH (ref 8–23)
BUN: 31 mg/dL — ABNORMAL HIGH (ref 8–23)
CO2: 25 mmol/L (ref 22–32)
CO2: 20 mmol/L — ABNORMAL LOW (ref 22–32)
Calcium: 9.1 mg/dL (ref 8.9–10.3)
Calcium: 8.5 mg/dL — ABNORMAL LOW (ref 8.9–10.3)
Chloride: 102 mmol/L (ref 98–111)
Chloride: 108 mmol/L (ref 98–111)
Creatinine, Ser: 1.45 mg/dL — ABNORMAL HIGH (ref 0.61–1.24)
Creatinine, Ser: 1.66 mg/dL — ABNORMAL HIGH (ref 0.61–1.24)
GFR, Estimated: 46 mL/min — ABNORMAL LOW (ref >60)
GFR, Estimated: 39 mL/min — ABNORMAL LOW (ref >60)
Glucose, Bld: 178 mg/dL — ABNORMAL HIGH (ref 70–99)
Glucose, Bld: 178 mg/dL — ABNORMAL HIGH (ref 70–99)
Potassium: 4.2 mmol/L (ref 3.5–5.1)
Potassium: 4.6 mmol/L (ref 3.5–5.1)
Sodium: 138 mmol/L (ref 135–145)
Sodium: 138 mmol/L (ref 135–145)
Total Bilirubin: 0.9 mg/dL (ref 0.3–1.2)
Total Bilirubin: 0.7 mg/dL (ref 0.3–1.2)
Total Protein: 7.3 g/dL (ref 6.5–8.1)
Total Protein: 5 g/dL — ABNORMAL LOW (ref 6.5–8.1)

## 2021-12-31 LAB — LACTIC ACID, PLASMA
Lactic Acid, Venous: 4.1 mmol/L (ref 0.5–1.9)
Lactic Acid, Venous: 4.3 mmol/L (ref 0.5–1.9)
Lactic Acid, Venous: 2.9 mmol/L (ref 0.5–1.9)
Lactic Acid, Venous: 4.1 mmol/L (ref 0.5–1.9)

## 2021-12-31 LAB — CBC WITH DIFFERENTIAL/PLATELET
Abs Immature Granulocytes: 0 10*3/uL (ref 0.00–0.07)
Basophils Absolute: 0 10*3/uL (ref 0.0–0.1)
Basophils Relative: 0 %
Eosinophils Absolute: 0 10*3/uL (ref 0.0–0.5)
Eosinophils Relative: 0 %
HCT: 39.6 % (ref 39.0–52.0)
Hemoglobin: 12.9 g/dL — ABNORMAL LOW (ref 13.0–17.0)
Immature Granulocytes: 0 %
Lymphocytes Relative: 17 %
Lymphs Abs: 0.2 10*3/uL — ABNORMAL LOW (ref 0.7–4.0)
MCH: 31.1 pg (ref 26.0–34.0)
MCHC: 32.6 g/dL (ref 30.0–36.0)
MCV: 95.4 fL (ref 80.0–100.0)
Monocytes Absolute: 0 10*3/uL — ABNORMAL LOW (ref 0.1–1.0)
Monocytes Relative: 0 %
Neutro Abs: 1 10*3/uL — ABNORMAL LOW (ref 1.7–7.7)
Neutrophils Relative %: 83 %
Platelets: 356 10*3/uL (ref 150–400)
RBC: 4.15 MIL/uL — ABNORMAL LOW (ref 4.22–5.81)
RDW: 13.9 % (ref 11.5–15.5)
WBC: 1.2 10*3/uL — CL (ref 4.0–10.5)
nRBC: 0 % (ref 0.0–0.2)

## 2021-12-31 LAB — PROTIME-INR
INR: 1.4 — ABNORMAL HIGH (ref 0.8–1.2)
Prothrombin Time: 17.4 seconds — ABNORMAL HIGH (ref 11.4–15.2)

## 2021-12-31 LAB — POCT I-STAT 7, (LYTES, BLD GAS, ICA,H+H)
Acid-base deficit: 7 mmol/L — ABNORMAL HIGH (ref 0.0–2.0)
Bicarbonate: 20.9 mmol/L (ref 20.0–28.0)
Calcium, Ion: 1.25 mmol/L (ref 1.15–1.40)
HCT: 35 % — ABNORMAL LOW (ref 39.0–52.0)
Hemoglobin: 11.9 g/dL — ABNORMAL LOW (ref 13.0–17.0)
O2 Saturation: 100 %
Patient temperature: 36.7
Potassium: 4.3 mmol/L (ref 3.5–5.1)
Sodium: 135 mmol/L (ref 135–145)
TCO2: 23 mmol/L (ref 22–32)
pCO2 arterial: 53.6 mmHg — ABNORMAL HIGH (ref 32–48)
pH, Arterial: 7.199 — CL (ref 7.35–7.45)
pO2, Arterial: 250 mmHg — ABNORMAL HIGH (ref 83–108)

## 2021-12-31 LAB — TROPONIN I (HIGH SENSITIVITY)
Troponin I (High Sensitivity): 11 ng/L (ref <18)
Troponin I (High Sensitivity): 11 ng/L (ref <18)

## 2021-12-31 LAB — CBC
HCT: 35.8 % — ABNORMAL LOW (ref 39.0–52.0)
Hemoglobin: 11.8 g/dL — ABNORMAL LOW (ref 13.0–17.0)
MCH: 31.1 pg (ref 26.0–34.0)
MCHC: 33 g/dL (ref 30.0–36.0)
MCV: 94.5 fL (ref 80.0–100.0)
Platelets: 274 10*3/uL (ref 150–400)
RBC: 3.79 MIL/uL — ABNORMAL LOW (ref 4.22–5.81)
RDW: 14.1 % (ref 11.5–15.5)
WBC: 2.4 10*3/uL — ABNORMAL LOW (ref 4.0–10.5)
nRBC: 0 % (ref 0.0–0.2)

## 2021-12-31 LAB — TYPE AND SCREEN
ABO/RH(D): O POS
Antibody Screen: NEGATIVE

## 2021-12-31 LAB — GLUCOSE, CAPILLARY
Glucose-Capillary: 156 mg/dL — ABNORMAL HIGH (ref 70–99)
Glucose-Capillary: 133 mg/dL — ABNORMAL HIGH (ref 70–99)

## 2021-12-31 LAB — LIPASE, BLOOD: Lipase: 35 U/L (ref 11–51)

## 2021-12-31 LAB — MRSA NEXT GEN BY PCR, NASAL: MRSA by PCR Next Gen: NOT DETECTED

## 2021-12-31 SURGERY — LAPAROTOMY, EXPLORATORY
Anesthesia: General

## 2021-12-31 MED ORDER — ORAL CARE MOUTH RINSE
15.0000 mL | OROMUCOSAL | Status: DC
Start: 2021-12-31 — End: 2022-01-01
  Administered 2021-12-31: 15 mL via OROMUCOSAL

## 2021-12-31 MED ORDER — SODIUM CHLORIDE (PF) 0.9 % IJ SOLN
INTRAMUSCULAR | Status: AC
  Filled 2021-12-31: qty 20

## 2021-12-31 MED ORDER — ROCURONIUM BROMIDE 10 MG/ML (PF) SYRINGE
PREFILLED_SYRINGE | INTRAVENOUS | Status: DC | PRN
  Administered 2021-12-31: 20 mg via INTRAVENOUS
  Administered 2021-12-31: 40 mg via INTRAVENOUS

## 2021-12-31 MED ORDER — PHENYLEPHRINE HCL-NACL 20-0.9 MG/250ML-% IV SOLN
INTRAVENOUS | Status: DC | PRN
  Administered 2021-12-31: 50 ug/min via INTRAVENOUS

## 2021-12-31 MED ORDER — PANTOPRAZOLE SODIUM 40 MG IV SOLR
40.0000 mg | Freq: Every day | INTRAVENOUS | Status: DC
Start: 2021-12-31 — End: 2022-01-15
  Administered 2021-12-31 – 2022-01-14 (×15): 40 mg via INTRAVENOUS
  Filled 2021-12-31 (×15): qty 10

## 2021-12-31 MED ORDER — PIPERACILLIN-TAZOBACTAM 3.375 G IVPB
3.3750 g | Freq: Three times a day (TID) | INTRAVENOUS | Status: DC
  Administered 2021-12-31 – 2022-01-02 (×5): 3.375 g via INTRAVENOUS
  Filled 2021-12-31 (×5): qty 50

## 2021-12-31 MED ORDER — ONDANSETRON 4 MG PO TBDP: 4.0000 mg | ORAL_TABLET | Freq: Four times a day (QID) | ORAL | Status: DC | PRN

## 2021-12-31 MED ORDER — ORAL CARE MOUTH RINSE: 15.0000 mL | OROMUCOSAL | Status: DC | PRN

## 2021-12-31 MED ORDER — FENTANYL CITRATE PF 50 MCG/ML IJ SOSY
25.0000 ug | PREFILLED_SYRINGE | INTRAMUSCULAR | Status: DC | PRN
  Administered 2021-12-31 – 2022-01-03 (×17): 25 ug via INTRAVENOUS
  Filled 2021-12-31 (×17): qty 1

## 2021-12-31 MED ORDER — PHENYLEPHRINE HCL (PRESSORS) 10 MG/ML IV SOLN
INTRAVENOUS | Status: AC
  Filled 2021-12-31: qty 1

## 2021-12-31 MED ORDER — CHLORHEXIDINE GLUCONATE CLOTH 2 % EX PADS
6.0000 | MEDICATED_PAD | Freq: Every day | CUTANEOUS | Status: DC
  Administered 2022-01-02 – 2022-01-21 (×21): 6 via TOPICAL

## 2021-12-31 MED ORDER — LACTATED RINGERS IV SOLN: INTRAVENOUS | Status: DC | PRN

## 2021-12-31 MED ORDER — FENTANYL CITRATE PF 50 MCG/ML IJ SOSY: 25.0000 ug | PREFILLED_SYRINGE | INTRAMUSCULAR | Status: DC | PRN

## 2021-12-31 MED ORDER — SUCCINYLCHOLINE CHLORIDE 200 MG/10ML IV SOSY
PREFILLED_SYRINGE | INTRAVENOUS | Status: DC | PRN
  Administered 2021-12-31: 120 mg via INTRAVENOUS

## 2021-12-31 MED ORDER — FENTANYL CITRATE PF 50 MCG/ML IJ SOSY
50.0000 ug | PREFILLED_SYRINGE | Freq: Once | INTRAMUSCULAR | Status: AC
  Administered 2021-12-31: 50 ug via INTRAVENOUS
  Filled 2021-12-31: qty 1

## 2021-12-31 MED ORDER — HEMOSTATIC AGENTS (NO CHARGE) OPTIME
TOPICAL | Status: DC | PRN
  Administered 2021-12-31: 1 via TOPICAL

## 2021-12-31 MED ORDER — LACTATED RINGERS IV SOLN: INTRAVENOUS | Status: DC

## 2021-12-31 MED ORDER — ONDANSETRON HCL 4 MG/2ML IJ SOLN
4.0000 mg | Freq: Four times a day (QID) | INTRAMUSCULAR | Status: DC | PRN
  Administered 2022-01-02: 4 mg via INTRAVENOUS
  Filled 2021-12-31: qty 2

## 2021-12-31 MED ORDER — ONDANSETRON HCL 4 MG/2ML IJ SOLN
INTRAMUSCULAR | Status: DC | PRN
  Administered 2021-12-31: 4 mg via INTRAVENOUS

## 2021-12-31 MED ORDER — LACTATED RINGERS IV BOLUS
1600.0000 mL | Freq: Once | INTRAVENOUS | Status: AC
Start: 2021-12-31 — End: 2021-12-31
  Administered 2021-12-31: 1600 mL via INTRAVENOUS

## 2021-12-31 MED ORDER — ALBUMIN HUMAN 5 % IV SOLN: INTRAVENOUS | Status: DC | PRN

## 2021-12-31 MED ORDER — CHLORHEXIDINE GLUCONATE CLOTH 2 % EX PADS
6.0000 | MEDICATED_PAD | Freq: Every day | CUTANEOUS | Status: DC
  Administered 2021-12-31: 6 via TOPICAL

## 2021-12-31 MED ORDER — SURGIFLO WITH THROMBIN (HEMOSTATIC MATRIX KIT) OPTIME
TOPICAL | Status: DC | PRN
  Administered 2021-12-31 (×2): 1

## 2021-12-31 MED ORDER — VANCOMYCIN HCL IN DEXTROSE 1-5 GM/200ML-% IV SOLN
1000.0000 mg | INTRAVENOUS | Status: DC
Start: 2022-01-01 — End: 2022-01-01

## 2021-12-31 MED ORDER — PHENYLEPHRINE 80 MCG/ML (10ML) SYRINGE FOR IV PUSH (FOR BLOOD PRESSURE SUPPORT)
PREFILLED_SYRINGE | INTRAVENOUS | Status: DC | PRN
  Administered 2021-12-31 (×2): 80 ug via INTRAVENOUS

## 2021-12-31 MED ORDER — PROPOFOL 10 MG/ML IV BOLUS
INTRAVENOUS | Status: DC | PRN
  Administered 2021-12-31: 70 mg via INTRAVENOUS

## 2021-12-31 MED ORDER — SODIUM CHLORIDE 0.9 % IV SOLN: INTRAVENOUS | Status: DC | PRN

## 2021-12-31 MED ORDER — LACTATED RINGERS IV BOLUS
1000.0000 mL | Freq: Once | INTRAVENOUS | Status: AC
Start: 2021-12-31 — End: 2021-12-31
  Administered 2021-12-31: 1000 mL via INTRAVENOUS

## 2021-12-31 MED ORDER — 0.9 % SODIUM CHLORIDE (POUR BTL) OPTIME
TOPICAL | Status: DC | PRN
  Administered 2021-12-31: 2000 mL

## 2021-12-31 MED ORDER — PROPOFOL 10 MG/ML IV BOLUS
INTRAVENOUS | Status: AC
  Filled 2021-12-31: qty 20

## 2021-12-31 MED ORDER — SODIUM CHLORIDE 0.9 % IV SOLN: INTRAVENOUS | Status: DC

## 2021-12-31 MED ORDER — DIPHENHYDRAMINE HCL 50 MG/ML IJ SOLN
12.5000 mg | Freq: Four times a day (QID) | INTRAMUSCULAR | Status: DC | PRN
  Administered 2022-01-05: 12.5 mg via INTRAVENOUS
  Filled 2021-12-31: qty 1

## 2021-12-31 MED ORDER — HEPARIN SODIUM (PORCINE) 5000 UNIT/ML IJ SOLN
5000.0000 [IU] | Freq: Three times a day (TID) | INTRAMUSCULAR | Status: DC
  Administered 2022-01-02 – 2022-01-08 (×19): 5000 [IU] via SUBCUTANEOUS
  Filled 2021-12-31 (×18): qty 1

## 2021-12-31 MED ORDER — SODIUM CHLORIDE 0.9% IV SOLUTION: Freq: Once | INTRAVENOUS | Status: DC

## 2021-12-31 MED ORDER — LIDOCAINE HCL (PF) 2 % IJ SOLN
INTRAMUSCULAR | Status: AC
  Filled 2021-12-31: qty 5

## 2021-12-31 MED ORDER — ROCURONIUM BROMIDE 10 MG/ML (PF) SYRINGE
PREFILLED_SYRINGE | INTRAVENOUS | Status: AC
  Filled 2021-12-31: qty 10

## 2021-12-31 MED ORDER — CALCIUM CHLORIDE 10 % IV SOLN
INTRAVENOUS | Status: DC | PRN
  Administered 2021-12-31: 1 g via INTRAVENOUS

## 2021-12-31 MED ORDER — SUGAMMADEX SODIUM 200 MG/2ML IV SOLN
INTRAVENOUS | Status: DC | PRN
  Administered 2021-12-31: 200 mg via INTRAVENOUS

## 2021-12-31 MED ORDER — FENTANYL CITRATE (PF) 100 MCG/2ML IJ SOLN
INTRAMUSCULAR | Status: DC | PRN
  Administered 2021-12-31 (×3): 25 ug via INTRAVENOUS
  Administered 2021-12-31: 50 ug via INTRAVENOUS

## 2021-12-31 MED ORDER — FENTANYL CITRATE (PF) 100 MCG/2ML IJ SOLN
INTRAMUSCULAR | Status: AC
  Filled 2021-12-31: qty 2

## 2021-12-31 MED ORDER — LACTATED RINGERS IV BOLUS
500.0000 mL | Freq: Once | INTRAVENOUS | Status: DC
Start: 2021-12-31 — End: 2021-12-31

## 2021-12-31 MED ORDER — CALCIUM CHLORIDE 10 % IV SOLN
INTRAVENOUS | Status: AC
  Filled 2021-12-31: qty 10

## 2021-12-31 MED ORDER — IOHEXOL 350 MG/ML SOLN
80.0000 mL | Freq: Once | INTRAVENOUS | Status: AC | PRN
  Administered 2021-12-31: 80 mL via INTRAVENOUS

## 2021-12-31 MED ORDER — METOPROLOL TARTRATE 5 MG/5ML IV SOLN
INTRAVENOUS | Status: DC | PRN
  Administered 2021-12-31: 1 mg via INTRAVENOUS

## 2021-12-31 MED ORDER — ONDANSETRON HCL 4 MG/2ML IJ SOLN
4.0000 mg | Freq: Once | INTRAMUSCULAR | Status: AC
  Administered 2021-12-31: 4 mg via INTRAVENOUS
  Filled 2021-12-31: qty 2

## 2021-12-31 MED ORDER — ALBUMIN HUMAN 5 % IV SOLN
INTRAVENOUS | Status: AC
  Filled 2021-12-31: qty 500

## 2021-12-31 MED ORDER — VASOPRESSIN 20 UNIT/ML IV SOLN
INTRAVENOUS | Status: AC
  Filled 2021-12-31: qty 1

## 2021-12-31 MED ORDER — ESMOLOL HCL 100 MG/10ML IV SOLN
INTRAVENOUS | Status: DC | PRN
  Administered 2021-12-31 (×2): 20 mg via INTRAVENOUS
  Administered 2021-12-31: 10 mg via INTRAVENOUS

## 2021-12-31 MED ORDER — DIPHENHYDRAMINE HCL 12.5 MG/5ML PO ELIX: 12.5000 mg | ORAL_SOLUTION | Freq: Four times a day (QID) | ORAL | Status: DC | PRN

## 2021-12-31 MED ORDER — ONDANSETRON HCL 4 MG/2ML IJ SOLN: 4.0000 mg | Freq: Once | INTRAMUSCULAR | Status: DC | PRN

## 2021-12-31 MED ORDER — VANCOMYCIN HCL 1750 MG/350ML IV SOLN
1750.0000 mg | Freq: Once | INTRAVENOUS | Status: AC
Start: 2021-12-31 — End: 2021-12-31
  Administered 2021-12-31: 1750 mg via INTRAVENOUS
  Filled 2021-12-31: qty 350

## 2021-12-31 MED ORDER — SODIUM CHLORIDE 0.9 % IV SOLN
2.0000 g | Freq: Once | INTRAVENOUS | Status: AC
  Administered 2021-12-31: 2 g via INTRAVENOUS
  Filled 2021-12-31: qty 12.5

## 2021-12-31 MED ORDER — METOPROLOL TARTRATE 5 MG/5ML IV SOLN
INTRAVENOUS | Status: AC
  Filled 2021-12-31: qty 5

## 2021-12-31 MED ORDER — LACTATED RINGERS IV BOLUS
1500.0000 mL | Freq: Once | INTRAVENOUS | Status: DC
Start: 2021-12-31 — End: 2021-12-31

## 2021-12-31 SURGICAL SUPPLY — 61 items
APL SWBSTK 6 STRL LF DISP (MISCELLANEOUS) ×1
APPLICATOR COTTON TIP 6 STRL (MISCELLANEOUS) ×2 IMPLANT
APPLICATOR COTTON TIP 6IN STRL (MISCELLANEOUS) ×2 IMPLANT
ATTRACTOMAT 16X20 MAGNETIC DRP (DRAPES) ×2 IMPLANT
BAG COUNTER SPONGE SURGICOUNT (BAG) IMPLANT
BAG SPNG CNTER NS LX DISP (BAG)
BLADE EXTENDED COATED 6.5IN (ELECTRODE) ×3 IMPLANT
BLADE HEX COATED 2.75 (ELECTRODE) ×2 IMPLANT
BLADE SURG SZ10 CARB STEEL (BLADE) IMPLANT
BNDG GAUZE DERMACEA FLUFF (GAUZE/BANDAGES/DRESSINGS) ×1
BNDG GAUZE DERMACEA FLUFF 4 (GAUZE/BANDAGES/DRESSINGS) IMPLANT
BNDG GZE DERMACEA 4 6PLY (GAUZE/BANDAGES/DRESSINGS) ×1
CATH COUDE 5CC RIBBED (CATHETERS) IMPLANT
CATH RIBBED COUDE 5CC (CATHETERS) ×2
CLIP TI LARGE 6 (CLIP) IMPLANT
COVER MAYO STAND STRL (DRAPES) IMPLANT
DRAIN CHANNEL RND F F (WOUND CARE) ×2 IMPLANT
DRAIN PENROSE 1X36 LTX NONSTRL (WOUND CARE) ×2 IMPLANT
DRAPE LAPAROSCOPIC ABDOMINAL (DRAPES) ×3 IMPLANT
DRAPE WARM FLUID 44X44 (DRAPES) ×1 IMPLANT
DRSG PAD ABDOMINAL 8X10 ST (GAUZE/BANDAGES/DRESSINGS) ×1 IMPLANT
DRSG TEGADERM 4X4.75 (GAUZE/BANDAGES/DRESSINGS) ×1 IMPLANT
ELECT REM PT RETURN 15FT ADLT (MISCELLANEOUS) ×3 IMPLANT
EVACUATOR SILICONE 100CC (DRAIN) ×2 IMPLANT
GAUZE 4X4 16PLY ~~LOC~~+RFID DBL (SPONGE) IMPLANT
GAUZE SPONGE 4X4 12PLY STRL (GAUZE/BANDAGES/DRESSINGS) ×3 IMPLANT
GLOVE BIO SURGEON STRL SZ7.5 (GLOVE) ×5 IMPLANT
GLOVE BIOGEL PI IND STRL 7.0 (GLOVE) ×2 IMPLANT
GLOVE BIOGEL PI INDICATOR 7.0 (GLOVE) ×1
GOWN STRL REUS W/ TWL LRG LVL3 (GOWN DISPOSABLE) ×2 IMPLANT
GOWN STRL REUS W/ TWL XL LVL3 (GOWN DISPOSABLE) ×2 IMPLANT
GOWN STRL REUS W/TWL LRG LVL3 (GOWN DISPOSABLE) ×2
GOWN STRL REUS W/TWL XL LVL3 (GOWN DISPOSABLE) ×2
HANDLE SUCTION POOLE (INSTRUMENTS) ×2 IMPLANT
HEMOSTAT SURGICEL 4X8 (HEMOSTASIS) ×1 IMPLANT
KIT BASIN OR (CUSTOM PROCEDURE TRAY) ×3 IMPLANT
KIT TURNOVER KIT A (KITS) IMPLANT
LIGASURE IMPACT 36 18CM CVD LR (INSTRUMENTS) ×1 IMPLANT
NS IRRIG 1000ML POUR BTL (IV SOLUTION) ×3 IMPLANT
PACK GENERAL/GYN (CUSTOM PROCEDURE TRAY) ×3 IMPLANT
PLUG CATH AND CAP STER (CATHETERS) ×2 IMPLANT
SHEARS HARMONIC ACE PLUS 36CM (ENDOMECHANICALS) IMPLANT
SPONGE DRAIN TRACH 4X4 STRL 2S (GAUZE/BANDAGES/DRESSINGS) ×2 IMPLANT
SPONGE T-LAP 4X18 ~~LOC~~+RFID (SPONGE) IMPLANT
STAPLER 90 3.5 STAND SLIM (STAPLE)
STAPLER 90 3.5 STD SLIM (STAPLE) IMPLANT
STAPLER VISISTAT 35W (STAPLE) ×3 IMPLANT
SUCTION POOLE HANDLE (INSTRUMENTS)
SUT NOVA NAB GS-21 0 18 T12 DT (SUTURE) IMPLANT
SUT PDS AB 1 TP1 96 (SUTURE) ×2 IMPLANT
SUT PROLENE 2 0 KS (SUTURE) IMPLANT
SUT SILK 2 0 (SUTURE) ×4
SUT SILK 2 0 SH CR/8 (SUTURE) ×1 IMPLANT
SUT SILK 2-0 18XBRD TIE 12 (SUTURE) ×4 IMPLANT
SUT SILK 3 0 (SUTURE) ×2
SUT SILK 3 0 SH CR/8 (SUTURE) ×4 IMPLANT
SUT SILK 3-0 18XBRD TIE 12 (SUTURE) ×4 IMPLANT
TOWEL OR 17X26 10 PK STRL BLUE (TOWEL DISPOSABLE) ×5 IMPLANT
TRAY FOLEY MTR SLVR 16FR STAT (SET/KITS/TRAYS/PACK) ×3 IMPLANT
WATER STERILE IRR 1000ML POUR (IV SOLUTION) ×3 IMPLANT
YANKAUER SUCT BULB TIP NO VENT (SUCTIONS) ×2 IMPLANT

## 2021-12-31 NOTE — ED Provider Notes (Addendum)
This planPatient here with abdominal pain.  Shared visit.  Currently undergoing chemotherapy for gastric cancer.  Worsening abdominal pain today.  Last chemotherapy 2-week.  Denies any fevers or chills.  Denies any infectious symptoms.  States he feels constipated.  Tender in upper abdomen.  Somewhat well-appearing.  I have been asked to see him after some blood work has come back.  He appears neutropenic with a white count of 1.2, ANC of 1000.  At this time lactic acid is also elevated to 4.1.  Creatinine is 1.45.  He does not have any obvious decreased p.o. intake or nausea or vomiting or diarrhea.  We will expand out infectious work-up and start IV broad-spectrum antibiotics, blood cultures, CT scan abdomen pelvis, chest x-ray, urinalysis.  We will give IV fluids 30 cc/kg.  Will likely need observation admission to rule out infectious process.  Will need hydration and further symptomatic care as well.  Please see PA note for further results, evaluation, disposition of the patient.  Patient does have free air on CT scan abdomen pelvis.  Concern for perforated viscus.  Given his age and comorbidities this is likely going to be terminal.  Patient has a very hard time hearing at baseline.  He does not appear to have great insight about what is going on and overall hard to help make medical decisions with him.  We are trying to reach out to family member who can help with decision making as not sure if patient has capacity for this.  I have talked with general surgery who will evaluate the patient.  I will touch base with palliative care to help as well.  Ultimately I think comfort care is needed but we will continue IV antibiotics medical management until can come up with a plan with patient and family.  This chart was dictated using voice recognition software.  Despite best efforts to proofread,  errors can occur which can change the documentation meaning.    Lennice Sites, DO 12/31/21 1136    Lennice Sites, DO 12/31/21 1429

## 2021-12-31 NOTE — Progress Notes (Signed)
Elink following code sepsis °

## 2021-12-31 NOTE — Transfer of Care (Signed)
Immediate Anesthesia Transfer of Care Note  Patient: Dillon Smith  Procedure(s) Performed: EXPLORATORY LAPAROTOMY  Patient Location: PACU  Anesthesia Type:General  Level of Consciousness: awake  Airway & Oxygen Therapy: Patient Spontanous Breathing and Patient connected to face mask oxygen  Post-op Assessment: Report given to RN and Post -op Vital signs reviewed and stable  Post vital signs: Reviewed and stable  Last Vitals:  Vitals Value Taken Time  BP 132/88 12/31/21 1806  Temp    Pulse    Resp 24 12/31/21 1812  SpO2    Vitals shown include unvalidated device data.  Last Pain:  Vitals:   12/31/21 0929  TempSrc:   PainSc: 10-Worst pain ever         Complications: No notable events documented.

## 2021-12-31 NOTE — Progress Notes (Signed)
Pharmacy Antibiotic Note  Dillon Smith is a 88 y.o. male admitted on 12/31/2021 with intra-abdominal infection, perforated gastric cancer.  Pharmacy has been consulted for vancomycin and zosyn dosing.  Plan: Zosyn 3.375gm IV q8h (4hr extended infusions) - recommend changing to cefepime/flagyl to reduce risk of nephrotoxicity with concurrent vancomycin Vancomycin 1g IV q24h - next dose 7/4 at 8PM.  Estimated AUC 476 using SCr 1.45, Vd 0.72 Check vancomycin trough at steady state, goal AUC 400-550 Follow up renal function & cultures  Height: '5\' 11"'$  (180.3 cm) IBW/kg (Calculated) : 75.3  Temp (24hrs), Avg:97.9 F (36.6 C), Min:97.9 F (36.6 C), Max:97.9 F (36.6 C)  Recent Labs  Lab 12/27/21 0824 12/31/21 0941 12/31/21 1141 12/31/21 1428  WBC 7.4 1.2*  --   --   CREATININE 1.50* 1.45*  --   --   LATICACIDVEN  --  4.1* 4.3* 2.9*    Estimated Creatinine Clearance: 36.1 mL/min (A) (by C-G formula based on SCr of 1.45 mg/dL (H)).    Allergies  Allergen Reactions   Penicillins Itching    Antimicrobials this admission: 7/3 Cefepime x 1 7/3 Vanc >> 7/3 Zosyn >>  Dose adjustments this admission:  Microbiology results: 7/3 BCx:  Thank you for allowing pharmacy to be a part of this patient's care.  Peggyann Juba, PharmD, BCPS Pharmacy: [phone] 12/31/2021 6:16 PM

## 2021-12-31 NOTE — Anesthesia Procedure Notes (Signed)
Procedure Name: Intubation Date/Time: 12/31/2021 4:32 PM  Performed by: Niel Hummer, CRNAPre-anesthesia Checklist: Patient identified, Emergency Drugs available, Suction available and Patient being monitored Patient Re-evaluated:Patient Re-evaluated prior to induction Oxygen Delivery Method: Circle system utilized Preoxygenation: Pre-oxygenation with 100% oxygen Induction Type: IV induction, Rapid sequence and Cricoid Pressure applied Laryngoscope Size: Mac and 4 Grade View: Grade I Tube type: Oral Tube size: 7.0 mm Number of attempts: 1 Airway Equipment and Method: Stylet Placement Confirmation: ETT inserted through vocal cords under direct vision, positive ETCO2 and breath sounds checked- equal and bilateral Secured at: 22 cm Tube secured with: Tape Dental Injury: Teeth and Oropharynx as per pre-operative assessment

## 2021-12-31 NOTE — Progress Notes (Signed)
RN notified to get 3rd lactic acid

## 2021-12-31 NOTE — ED Notes (Signed)
Pt's port accessed using sterile technique

## 2021-12-31 NOTE — Progress Notes (Signed)
Notified provider and bedside nurse of need to order and administer fluid bolus. 2547 cc

## 2021-12-31 NOTE — Anesthesia Preprocedure Evaluation (Addendum)
Anesthesia Evaluation  Patient identified by MRN, date of birth, ID band Patient awake    Reviewed: Allergy & Precautions, NPO status , Patient's Chart, lab work & pertinent test results, Unable to perform ROS - Chart review only  History of Anesthesia Complications Negative for: history of anesthetic complications  Airway Mallampati: II  TM Distance: >3 FB Neck ROM: Full    Dental  (+) Poor Dentition, Dental Advisory Given, Missing   Pulmonary former smoker,    Pulmonary exam normal breath sounds clear to auscultation       Cardiovascular hypertension, Pt. on medications + CAD and + CABG (2007)  + dysrhythmias (eliquis last dose yesterday ) Atrial Fibrillation + Valvular Problems/Murmurs (mild to mod MR, mod AI) AI and MR  Rhythm:Irregular Rate:Tachycardia  Echo 2021 1. Left ventricular ejection fraction, by estimation, is 45 to 50%. The left ventricle has mildly decreased function. The left ventricle demonstrates global hypokinesis. The left ventricular internal cavity size was mildly dilated. Left ventricular diastolic function could not be evaluated.  2. Right ventricular systolic function is mildly reduced. The right ventricular size is normal. There is normal pulmonary artery systolic pressure. The estimated right ventricular systolic pressure is 03.7 mmHg.  3. Left atrial size was severely dilated.  4. The mitral valve is degenerative. Mild to moderate mitral valve regurgitation.  5. The aortic valve is tricuspid. Aortic valve regurgitation is moderate. No aortic stenosis is present.  6. The inferior vena cava is normal in size with greater than 50% respiratory variability, suggesting right atrial pressure of 3 mmHg.      Neuro/Psych Extremely HOH  CVA, No Residual Symptoms negative psych ROS   GI/Hepatic Neg liver ROS, Free air in abdomen in setting of known gastric cancer and recent endoscopic GJ    Endo/Other   negative endocrine ROS  Renal/GU Renal InsufficiencyRenal diseaseCr 1.45, known CKD 3  negative genitourinary   Musculoskeletal negative musculoskeletal ROS (+)   Abdominal   Peds  Hematology  (+) Blood dyscrasia, , Current chemo for gastric ca- hb 12.9, plt 356   Anesthesia Other Findings   Reproductive/Obstetrics negative OB ROS                           Anesthesia Physical Anesthesia Plan  ASA: 4 and emergent  Anesthesia Plan: General   Post-op Pain Management: Ofirmev IV (intra-op)*   Induction:   PONV Risk Score and Plan: 3 and Treatment may vary due to age or medical condition, Ondansetron and Dexamethasone  Airway Management Planned: Oral ETT  Additional Equipment: CVP, Ultrasound Guidance Line Placement and Arterial line  Intra-op Plan:   Post-operative Plan: Possible Post-op intubation/ventilation and Extubation in OR  Informed Consent: I have reviewed the patients History and Physical, chart, labs and discussed the procedure including the risks, benefits and alternatives for the proposed anesthesia with the patient or authorized representative who has indicated his/her understanding and acceptance.   Patient has DNR.  Discussed DNR with patient, Discussed DNR with power of attorney and Suspend DNR.   Dental advisory given and Consent reviewed with POA  Plan Discussed with:   Anesthesia Plan Comments: (D/w daughters and nieces as well as patient- all wish to suspend DNR in operating room, agree to chest compressions and defibrillation if needed. )      Anesthesia Quick Evaluation

## 2021-12-31 NOTE — Progress Notes (Signed)
eLink Physician-Brief Progress Note Patient Name: Dillon Smith DOB: 11/29/1929 MRN: [id number]   Date of Service  12/31/2021  HPI/Events of Note  89/F with gastric cancer on chemotherapy, presenting with abdominal pain and vomiting.  CT showed intra-abdominal free air consistent with bowel perforation. He is now s/p EL and drainage of intraabdominal abscess.  BP 119/53, HR 94, RR 30s, O2 sats 99% on nasal cannula Pt is awake and alert, conversant.   Lactate 4.3 --> 2.3 7.2/53.6/250  eICU Interventions  Bowel perforation s/p EL and drainage of intraabdominal abscess Acute blood loss anemia Atrial fibrillation on eliquis Lactic acidosis CKD3  Continue empiric antibiotics. Follow up cultures.  Eliquis on hold. Trend CBC.  Fentanyl for pain control.  Protonix for GI prophylaxis.  SCDs for DVT prophylaxis.      Intervention Category Evaluation Type: New Patient Evaluation  [name] 12/31/2021, 9:24 PM

## 2021-12-31 NOTE — Progress Notes (Signed)
eLink Physician-Brief Progress Note Patient Name: Dillon Smith DOB: 11/29/1929 MRN: [id number]   Date of Service  12/31/2021  HPI/Events of Note  Notified of critical lactic acid at 4.1 <-- 2.9.  Pt also vomiting bilious material.  NGT to LIWS is also suctioning bile.   eICU Interventions  Continue NS. Repeat KUB.      Intervention Category Intermediate Interventions: Other:  [name] 12/31/2021, 11:46 PM

## 2021-12-31 NOTE — Progress Notes (Signed)
Patient noted to have increase in coughing when turned to his right side. Patient on arrival to floor noted to coughing up what looked like green sputum. After some time, patient has now coughed up approx. 40 cc of bile. Patient's NG is on low intermittent wall suction also suctioning bile. Damon notified.

## 2021-12-31 NOTE — Anesthesia Postprocedure Evaluation (Signed)
Anesthesia Post Note  Patient: Dillon Smith  Procedure(s) Performed: EXPLORATORY LAPAROTOMY     Patient location during evaluation: PACU Anesthesia Type: General Level of consciousness: awake and alert, oriented and patient cooperative Pain management: pain level controlled Vital Signs Assessment: post-procedure vital signs reviewed and stable Respiratory status: spontaneous breathing, nonlabored ventilation and respiratory function stable Cardiovascular status: blood pressure returned to baseline and stable Postop Assessment: no apparent nausea or vomiting Anesthetic complications: no   No notable events documented.  Last Vitals:  Vitals:   12/31/21 1806 12/31/21 1823  BP:  (P) 121/64  Pulse:    Resp:    Temp: (!) 36.4 C   SpO2: 100%     Last Pain:  Vitals:   12/31/21 1806  TempSrc:   PainSc: 0-No pain                 Pervis Hocking

## 2021-12-31 NOTE — Progress Notes (Signed)
Notified MD and RN of need for lactic acid #3 at 1240

## 2021-12-31 NOTE — Anesthesia Procedure Notes (Signed)
Central Venous Catheter Insertion Performed by: Pervis Hocking, DO, anesthesiologist Start/End7/08/2021 4:34 PM, 12/31/2021 4:44 PM Patient location: OR. Preanesthetic checklist: patient identified, IV checked, site marked, risks and benefits discussed, surgical consent, monitors and equipment checked, pre-op evaluation, timeout performed and anesthesia consent Lidocaine 1% used for infiltration and patient sedated Hand hygiene performed  and maximum sterile barriers used  Catheter size: 8 Fr Total catheter length 16. Central line was placed.Double lumen Procedure performed using ultrasound guided technique. Ultrasound Notes:image(s) printed for medical record Attempts: 1 Following insertion, dressing applied and line sutured. Post procedure assessment: blood return through all ports  Patient tolerated the procedure well with no immediate complications. Additional procedure comments: R IJ port in place, elected for LIJ double lumen.

## 2021-12-31 NOTE — Addendum Note (Signed)
Addendum  created 12/31/21 1846 by Pervis Hocking, DO   Order list changed, Pharmacy for encounter modified

## 2021-12-31 NOTE — H&P (Signed)
Dillon Smith is an 88 y.o. male.   Chief Complaint: Abdominal pain HPI: The patient is a 88 year old black male who has a history of recently diagnosed gastric cancer.  He is very functional and in good health for his age.  He was deemed to be a candidate for treatment.  He had an endoscopically placed gastrojejunostomy stent to relieve the obstruction.  This has allowed him to eat.  He was started on chemotherapy which he is actively receiving.  This morning he woke up with upper abdominal pain that was new.  He denies any fevers or chills.  He has had 1 episode of vomiting.  He came to the emergency department where a CT scan showed free intra-abdominal air consistent with bowel perforation.  Given how functional he is he would like to have everything done.  He states that he is making his own health decisions.  Past Medical History:  Diagnosis Date   Bradycardia    CKD (chronic kidney disease), stage III (Dillon Smith)    Coronary artery disease    CABG 2007   COVID-19 08/2019   Hyperlipidemia LDL goal <70    Hypertension    Persistent atrial fibrillation ([place])    Prostate cancer (Dillon Smith) 2010   PVC's (premature ventricular contractions)    Stroke (cerebrum) (Dillon Smith)     Past Surgical History:  Procedure Laterality Date   COLONOSCOPY  2004   ESOPHAGOGASTRODUODENOSCOPY (EGD) WITH PROPOFOL N/A 11/22/2021   Procedure: ESOPHAGOGASTRODUODENOSCOPY (EGD) WITH PROPOFOL;  Surgeon: Carol Ada, MD;  Location: [facility];  Service: Gastroenterology;  Laterality: N/A;   EUS N/A 11/22/2021   Procedure: UPPER ENDOSCOPIC ULTRASOUND (EUS) RADIAL;  Surgeon: Carol Ada, MD;  Location: [facility];  Service: Gastroenterology;  Laterality: N/A;   INSERTION PROSTATE RADIATION SEED  2010   IR IMAGING GUIDED PORT INSERTION  11/28/2021   stent      Family History  Problem Relation Age of Onset   Cancer Neg Hx    Social History:  reports that he has quit smoking. He has never used smokeless tobacco. He  reports that he does not currently use alcohol. He reports that he does not currently use drugs.  Allergies:  Allergies  Allergen Reactions   Penicillins Itching    (Not in a hospital admission)   Results for orders placed or performed during the hospital encounter of 12/31/21 (from the past 48 hour(s))  CBC with Differential     Status: Abnormal   Collection Time: 12/31/21  9:41 AM  Result Value Ref Range   WBC 1.2 (LL) 4.0 - 10.5 K/uL    Comment: REPEATED TO VERIFY THIS CRITICAL RESULT HAS VERIFIED AND BEEN CALLED TO Carter Kitten, M. RN BY NICOLE MCCOY ON 07 03 2023 AT 1044, AND HAS BEEN READ BACK. CRITICAL RESULT VERIFIED    RBC 4.15 (L) 4.22 - 5.81 MIL/uL   Hemoglobin 12.9 (L) 13.0 - 17.0 g/dL   HCT 39.6 39.0 - 52.0 %   MCV 95.4 80.0 - 100.0 fL   MCH 31.1 26.0 - 34.0 pg   MCHC 32.6 30.0 - 36.0 g/dL   RDW 13.9 11.5 - 15.5 %   Platelets 356 150 - 400 K/uL   nRBC 0.0 0.0 - 0.2 %   Neutrophils Relative % 83 %   Neutro Abs 1.0 (L) 1.7 - 7.7 K/uL   Lymphocytes Relative 17 %   Lymphs Abs 0.2 (L) 0.7 - 4.0 K/uL   Monocytes Relative 0 %   Monocytes Absolute 0.0 (L)  0.1 - 1.0 K/uL   Eosinophils Relative 0 %   Eosinophils Absolute 0.0 0.0 - 0.5 K/uL   Basophils Relative 0 %   Basophils Absolute 0.0 0.0 - 0.1 K/uL   Immature Granulocytes 0 %   Abs Immature Granulocytes 0.00 0.00 - 0.07 K/uL   Ovalocytes PRESENT     Comment: Performed at [facility], [place] [address], [place], [place] [address]  Comprehensive metabolic panel     Status: Abnormal   Collection Time: 12/31/21  9:41 AM  Result Value Ref Range   Sodium 138 135 - 145 mmol/L   Potassium 4.2 3.5 - 5.1 mmol/L   Chloride 102 98 - 111 mmol/L   CO2 25 22 - 32 mmol/L   Glucose, Bld 178 (H) 70 - 99 mg/dL    Comment: Glucose reference range applies only to samples taken after fasting for at least 8 hours.   BUN 26 (H) 8 - 23 mg/dL   Creatinine, Ser 1.45 (H) 0.61 - 1.24 mg/dL   Calcium 9.1 8.9 - 10.3 mg/dL    Total Protein 7.3 6.5 - 8.1 g/dL   Albumin 2.9 (L) 3.5 - 5.0 g/dL   AST 41 15 - 41 U/L   ALT 62 (H) 0 - 44 U/L   Alkaline Phosphatase 102 38 - 126 U/L   Total Bilirubin 0.9 0.3 - 1.2 mg/dL   GFR, Estimated 46 (L) >60 mL/min    Comment: (NOTE) Calculated using the CKD-EPI Creatinine Equation (2021)    Anion gap 11 5 - 15    Comment: Performed at [facility], [place] [address] [place], [place] [address]  Lipase, blood     Status: None   Collection Time: 12/31/21  9:41 AM  Result Value Ref Range   Lipase 35 11 - 51 U/L    Comment: Performed at [facility], [place] [address], [place], [place] [address]  Lactic acid, plasma     Status: Abnormal   Collection Time: 12/31/21  9:41 AM  Result Value Ref Range   Lactic Acid, Venous 4.1 (HH) 0.5 - 1.9 mmol/L    Comment: CRITICAL RESULT CALLED TO, READ BACK BY AND VERIFIED WITH: MONTEE,B. RN AT 1116 12/31/21 MULLINS,T Performed at [facility], [place] [address], [place], [place] [address]   Troponin I (High Sensitivity)     Status: None   Collection Time: 12/31/21  9:41 AM  Result Value Ref Range   Troponin I (High Sensitivity) 11 <18 ng/L    Comment: (NOTE) Elevated high sensitivity troponin I (hsTnI) values and significant  changes across serial measurements may suggest ACS but many other  chronic and acute conditions are known to elevate hsTnI results.  Refer to the "Links" section for chest pain algorithms and additional  guidance. Performed at [facility], [place] [address] [place], [place] [address]   Lactic acid, plasma     Status: Abnormal   Collection Time: 12/31/21 11:41 AM  Result Value Ref Range   Lactic Acid, Venous 4.3 (HH) 0.5 - 1.9 mmol/L    Comment: CRITICAL VALUE NOTED.  VALUE IS CONSISTENT WITH PREVIOUSLY REPORTED AND CALLED VALUE. Performed at [facility], [place] [address] [place], [place] [address]   Troponin I (High  Sensitivity)     Status: None   Collection Time: 12/31/21 11:43 AM  Result Value Ref Range   Troponin I (High Sensitivity) 11 <18 ng/L    Comment: (NOTE) Elevated high sensitivity  troponin I (hsTnI) values and significant  changes across serial measurements may suggest ACS but many other  chronic and acute conditions are known to elevate hsTnI results.  Refer to the "Links" section for chest pain algorithms and additional  guidance. Performed at [facility], [place] [address], [place], [place] [address]    DG Chest Portable 1 View  Result Date: 12/31/2021 CLINICAL DATA:  Provided history: Epigastric pain, rule out infection. EXAM: PORTABLE CHEST 1 VIEW COMPARISON:  Chest radiograph 09/12/2021 (images available, report unavailable). FINDINGS: Right chest infusion port catheter with tip projecting at the level of the superior cavoatrial junction. Prior median sternotomy/CABG. Heart size at the upper limits of normal. Heart size at the upper limits of normal. No appreciable airspace consolidation or pulmonary edema. No evidence of pleural effusion or pneumothorax. No acute bony abnormality identified. IMPRESSION: Right chest infusion port catheter with tip at the level of the superior cavoatrial junction. No evidence of acute cardiopulmonary abnormality. Aortic Atherosclerosis (ICD10-I70.0). Electronically Signed   By: Kellie Simmering D.O.   On: 12/31/2021 11:58    Review of Systems  Constitutional: Negative.   HENT: Negative.    Eyes: Negative.   Respiratory: Negative.    Cardiovascular: Negative.   Gastrointestinal:  Positive for abdominal pain.  Endocrine: Negative.   Genitourinary: Negative.   Musculoskeletal: Negative.   Skin: Negative.   Allergic/Immunologic: Negative.   Neurological: Negative.   Hematological: Negative.   Psychiatric/Behavioral: Negative.      Blood pressure (!) 141/48, pulse (!) 108, temperature 97.9 F (36.6 C), temperature source Oral, resp.  rate 11, SpO2 97 %. Physical Exam Vitals reviewed.  Constitutional:      General: He is not in acute distress.    Appearance: Normal appearance.  HENT:     Head: Normocephalic and atraumatic.     Right Ear: External ear normal.     Left Ear: External ear normal.     Nose: Nose normal.     Mouth/Throat:     Mouth: Mucous membranes are dry.     Pharynx: Oropharynx is clear.  Eyes:     Extraocular Movements: Extraocular movements intact.     Conjunctiva/sclera: Conjunctivae normal.     Pupils: Pupils are equal, round, and reactive to light.  Cardiovascular:     Rate and Rhythm: Normal rate and regular rhythm.     Pulses: Normal pulses.     Heart sounds: Normal heart sounds.  Pulmonary:     Effort: Pulmonary effort is normal. No respiratory distress.     Breath sounds: Normal breath sounds.  Abdominal:     General: Abdomen is flat.     Comments: There is moderate diffuse tenderness  Musculoskeletal:        General: No swelling or deformity. Normal range of motion.     Cervical back: Normal range of motion. No tenderness.  Skin:    General: Skin is warm and dry.     Coloration: Skin is not jaundiced.  Neurological:     General: No focal deficit present.     Mental Status: He is alert and oriented to person, place, and time.  Psychiatric:        Mood and Affect: Mood normal.        Behavior: Behavior normal.        Thought Content: Thought content normal.      Assessment/Plan The patient appears to have potentially a perforated gastric cancer while he is actively receiving chemotherapy and is  on blood thinners.  Unfortunately all of this makes him very high risk for complication from surgery but without surgery he will likely die of sepsis.  I have discussed these options with him and he would like to have everything done.  He will likely need exploration urgently this afternoon with either repair or resection of the perforation.  I have discussed the different options for  repair versus resection with him and with Dr. [name] who was following him for timing of surgery.  We both feel he would benefit from urgent surgery but would prefer to do as little as possible to control the leak and possibly getting back to treatment and a more definitive surgery down the road.  The patient understands that he is at significantly high risk and that this event could cause him to die as well.  He agrees to surgery this afternoon.  We will type and cross him should he need blood products given the fact that he is on blood thinners and actively receiving chemotherapy.  He will need to be admitted postoperatively to the ICU for close monitoring.  He has already been started on broad-spectrum antibiotic therapy and IV fluids.  We will begin making plans for urgent surgery this afternoon.  Autumn Messing III, MD 12/31/2021, 2:55 PM

## 2021-12-31 NOTE — Consult Note (Signed)
NAME:  Dillon Smith, MRN:  [id number], DOB:  [birth year], LOS: 0 ADMISSION DATE:  12/31/2021, CONSULTATION DATE:  12/31/21 REFERRING MD:  Marlou Starks - CCS , CHIEF COMPLAINT:  abdominal pain  History of Present Illness:  88 yo M with stage IIB gastric adenocarcinoma currently receiving chemo, Afib on eliquis, CKD III, presented to [facility] ED 7/3 with abdominal pain.  CT a/p in ED was significant for intraabdominal free air, concerning for bowel perforation. He was posted urgently for ex lap this afternoon with plan for ICU admission post op  PCCM consulted in this setting   Pertinent  Medical History   Stage IIB gastric adenocarcinoma  CAD HTN Biventricular heart failure  CVA CKD III Afib Prostate cancer   Significant Hospital Events: Including procedures, antibiotic start and stop dates in addition to other pertinent events   7/3 ED with abdominal pain. Ex lap for perf in setting fo gastric cancer   Interim History / Subjective:   POD 0 ex lap  Objective   Blood pressure 130/89, pulse 95, temperature 97.9 F (36.6 C), temperature source Oral, resp. rate 20, SpO2 97 %.       No intake or output data in the 24 hours ending 12/31/21 1553 There were no vitals filed for this visit.  Examination: General: elderly M NAD in PACU HENT: NCAT [place] in place anicteric sclera Lungs: Coarse right sided sounds. Even and unlabored. L IJ CVC. R chest port  Cardiovascular: rr s1s2 cap refill < 3 sec  Abdomen: Round soft. Drains with small volume serosang output Extremities: no acute joint deformity. L radial art line Neuro: Confused. Moving spontaneously. PERRL GU: defer   Resolved Hospital Problem list     Assessment & Plan:   Perforated Gastric Cancer-- POD 0 ex lap,  intraabdominal abscess drainage Gastric adenocarcinoma  ABLA -EBL 450, small hepatic injury. 2 blakes placed in OR.  P -post op per CCS -serial CBCs -Hgb goal > 7  -consider [place] if bleeding  -will order for zosyn per  pharm   Acute encephalopathy -expect this to clear as related to anesthetic  -correct other metabolic abnormalities as able   Afib on eliquis -holding eliquis post op -cardiac monitoring -optimize lytes   Metabolic acidosis  Lactic acidosis -supportive care, correct underlying causes as able  -recheck a LA, CMP post op  CKD III  -trend BMP  -renal dosing -follow I/O closely  Neutropenia  -started on vanc, cefepime 7/3-- changing to zosyn     Best Practice (right click and "Reselect all SmartList Selections" daily)   Diet/type: NPO DVT prophylaxis: SCD GI prophylaxis: N/A Lines: Central line, arterial line  Foley:  N/A Code Status:  DNR Last date of multidisciplinary goals of care discussion [--]  Labs   CBC: Recent Labs  Lab 12/27/21 0824 12/31/21 0941  WBC 7.4 1.2*  NEUTROABS 5.8 1.0*  HGB 12.0* 12.9*  HCT 34.7* 39.6  MCV 91.3 95.4  PLT 185 983    Basic Metabolic Panel: Recent Labs  Lab 12/27/21 0824 12/31/21 0941  NA 133* 138  K 3.9 4.2  CL 100 102  CO2 25 25  GLUCOSE 138* 178*  BUN 20 26*  CREATININE 1.50* 1.45*  CALCIUM 9.0 9.1   GFR: Estimated Creatinine Clearance: 36.1 mL/min (A) (by C-G formula based on SCr of 1.45 mg/dL (H)). Recent Labs  Lab 12/27/21 0824 12/31/21 0941 12/31/21 1141 12/31/21 1428  WBC 7.4 1.2*  --   --   LATICACIDVEN  --  4.1* 4.3* 2.9*    Liver Function Tests: Recent Labs  Lab 12/27/21 0824 12/31/21 0941  AST 60* 41  ALT 77* 62*  ALKPHOS 82 102  BILITOT 0.5 0.9  PROT 6.8 7.3  ALBUMIN 3.2* 2.9*   Recent Labs  Lab 12/31/21 0941  LIPASE 35   No results for input(s): "AMMONIA" in the last 168 hours.  ABG    Component Value Date/Time   TCO2 23 08/15/2019 1948     Coagulation Profile: No results for input(s): "INR", "PROTIME" in the last 168 hours.  Cardiac Enzymes: No results for input(s): "CKTOTAL", "CKMB", "CKMBINDEX", "TROPONINI" in the last 168 hours.  HbA1C: Hgb A1c MFr Bld   Date/Time Value Ref Range Status  08/16/2019 02:42 AM 6.3 (H) 4.8 - 5.6 % Final    Comment:    (NOTE) Pre diabetes:          5.7%-6.4% Diabetes:              >6.4% Glycemic control for   <7.0% adults with diabetes     CBG: No results for input(s): "GLUCAP" in the last 168 hours.  Review of Systems:   Unable to obtain due to patient condition   Past Medical History:  He,  has a past medical history of Bradycardia, CKD (chronic kidney disease), stage III ([place]), Coronary artery disease, COVID-19 (08/2019), Hyperlipidemia LDL goal <70, Hypertension, Persistent atrial fibrillation ([place]), Prostate cancer ([place]) (2010), PVC's (premature ventricular contractions), and Stroke (cerebrum) ([place]).   Surgical History:   Past Surgical History:  Procedure Laterality Date   COLONOSCOPY  2004   ESOPHAGOGASTRODUODENOSCOPY (EGD) WITH PROPOFOL N/A 11/22/2021   Procedure: ESOPHAGOGASTRODUODENOSCOPY (EGD) WITH PROPOFOL;  Surgeon: Carol Ada, MD;  [facility];  Service: Gastroenterology;  Laterality: N/A;   EUS N/A 11/22/2021   Procedure: UPPER ENDOSCOPIC ULTRASOUND (EUS) RADIAL;  Surgeon: Carol Ada, MD;  [facility];  Service: Gastroenterology;  Laterality: N/A;   INSERTION PROSTATE RADIATION SEED  2010   IR IMAGING GUIDED PORT INSERTION  11/28/2021   stent       Social History:   reports that he has quit smoking. He has never used smokeless tobacco. He reports that he does not currently use alcohol. He reports that he does not currently use drugs.   Family History:  His family history is negative for Cancer.   Allergies Allergies  Allergen Reactions   Penicillins Itching     Home Medications  Prior to Admission medications   Medication Sig Start Date End Date Taking? Authorizing Provider  amLODipine (NORVASC) 10 MG tablet Take 10 mg by mouth daily. 06/01/19  Yes [provider]  apixaban (ELIQUIS) 5 MG TABS tablet Take 2.5 mg by mouth 2 (two) times  daily.   Yes [provider]  atorvastatin (LIPITOR) 40 MG tablet Take 1 tablet (40 mg total) by mouth at bedtime. 08/17/19   Garvin Fila, MD  glycerin adult 2 g suppository Place 1 suppository rectally as needed for constipation. 12/22/21   Regan Lemming, MD  lidocaine-prilocaine (EMLA) cream Apply to affected area once 11/23/21   [name], MD  Multiple Vitamin (MULTI VITAMIN DAILY PO) Take 1 tablet by mouth daily.    [provider]  ondansetron (ZOFRAN) 8 MG tablet Take 1 tablet (8 mg total) by mouth 2 (two) times daily as needed for refractory nausea / vomiting. Start on day 3 after chemotherapy. 11/23/21   [name], MD  prochlorperazine (COMPAZINE) 10 MG tablet Take 1 tablet (  10 mg total) by mouth every 6 (six) hours as needed (Nausea or vomiting). 11/23/21   [name], MD     Critical care time: 40 min     CRITICAL CARE Performed by: Cristal Generous   Total critical care time: 40 minutes  Critical care time was exclusive of separately billable procedures and treating other patients. Critical care was necessary to treat or prevent imminent or life-threatening deterioration.  Critical care was time spent personally by me on the following activities: development of treatment plan with patient and/or surrogate as well as nursing, discussions with consultants, evaluation of patient's response to treatment, examination of patient, obtaining history from patient or surrogate, ordering and performing treatments and interventions, ordering and review of laboratory studies, ordering and review of radiographic studies, pulse oximetry and re-evaluation of patient's condition.  Eliseo Gum MSN, AGACNP-BC [place] for pager 12/31/2021, 6:52 PM

## 2021-12-31 NOTE — Op Note (Signed)
12/31/2021  5:50 PM  PATIENT:  Dillon Smith  88 y.o. male  PRE-OPERATIVE DIAGNOSIS:  perforated gastric cancer  POST-OPERATIVE DIAGNOSIS:  perforated gastric cancer  PROCEDURE:  Procedure(s): EXPLORATORY LAPAROTOMY (N/A) DRAINAGE OF INTRAABDOMINAL ABSCESS  SURGEON:  Surgeon(s) and Role:    * Jovita Kussmaul, MD - Primary    * Leighton Ruff, MD - Assisting  PHYSICIAN ASSISTANT:   ASSISTANTS: Dr. Marcello Moores   ANESTHESIA:   general  EBL:  450 mL   BLOOD ADMINISTERED:none  DRAINS: (2) Blake drain(s) in the upper abdomen    LOCAL MEDICATIONS USED:  NONE  SPECIMEN:  No Specimen  DISPOSITION OF SPECIMEN:  N/A  COUNTS:  YES  TOURNIQUET:  * No tourniquets in log *  DICTATION: .Dragon Dictation  After informed consent was obtained the patient was brought to the operating room and placed in the supine position on the operating table.  After adequate induction of general anesthesia the patient's abdomen was prepped with ChloraPrep, allowed to dry, and draped in usual sterile manner.  An upper midline incision was made with a 10 blade knife.  The incision was carried through the skin and subcutaneous tissue sharply with the electrocautery until the linea alba was identified.  The linea alba was incised with the electrocautery as well.  The preperitoneal space was probed bluntly with a hemostat until the peritoneum was opened and access was gained to the abdominal cavity.  The rest of the incision was opened under direct vision.  A large amount of cloudy fluid was evacuated from the abdomen.  The majority of the inflammation appeared to be located around the distal stomach.  I was able to separate most of the stomach from the liver with gentle finger dissection.  In doing so we did make a small tear in the liver.  This was controlled with Floseal and pressure.  I was never able to identify an area of leak from the stomach.  We did get into the lesser sac as well and could not see the stent  or evidence of leak on the posterior wall of the stomach.  The small bowel was run from the ligament of Treitz to the ileocecal valve and no areas of leak were identified.  The colon was also examined and again no leak was identified.  At this point I elected to place two19 [place] round Blake drains in the upper abdomen across the stomach.  The drains were anchored to the skin with 2-0 nylon stitches.  The liver was inspected again and did appear to be hemostatic.  The fascia of the anterior abdominal wall was then closed with 2 running #1 double-stranded looped PDS sutures.  The subcutaneous tissue was packed with moistened Kerlix gauze and sterile dressings were applied.  The patient tolerated the procedure well.  At the end of the case all needle sponge and instrument counts were correct.  The patient was then awakened and taken to recovery in stable condition.  He will be taken to the ICU for close observation and resuscitation.  PLAN OF CARE: Admit to inpatient   PATIENT DISPOSITION:  ICU - extubated and stable.   Delay start of Pharmacological VTE agent (>24hrs) due to surgical blood loss or risk of bleeding: yes

## 2021-12-31 NOTE — Anesthesia Procedure Notes (Signed)
Arterial Line Insertion Start/End7/08/2021 4:17 PM, 12/31/2021 4:28 PM Performed by: Pervis Hocking, DO, anesthesiologist  Patient location: OR. Preanesthetic checklist: patient identified, IV checked, site marked, risks and benefits discussed, surgical consent, monitors and equipment checked, pre-op evaluation, timeout performed and anesthesia consent Lidocaine 1% used for infiltration Left, radial was placed Catheter size: 20 G Hand hygiene performed  and maximum sterile barriers used   Attempts: 1 Procedure performed without using ultrasound guided technique. Following insertion, dressing applied. Post procedure assessment: normal and unchanged  Patient tolerated the procedure well with no immediate complications. Additional procedure comments: Pre-induction arterial line in OR .

## 2021-12-31 NOTE — ED Triage Notes (Signed)
EMS reports from home stomach CA pt, c/o central abdominal pain beginning this morning.  BP 100/70 HR 81 RR 20 Sp02  CBG 139

## 2021-12-31 NOTE — ED Notes (Signed)
Date and time results received: 12/31/21 11:16 AM (use smartphrase ".now" to insert current time)  Test: Lactic Acid  Critical Value: 4.1  Name of Provider Notified: Sharyn Lull PA  Orders Received? Or Actions Taken?:  Repeat lactic acid

## 2021-12-31 NOTE — ED Provider Notes (Signed)
[facility] DEPT Provider Note   CSN: [id number] Arrival date & time: 12/31/21  0920     History  Chief Complaint  Patient presents with   Abdominal Pain    Dillon Smith is a 88 y.o. male.  HPI 88 year old male with a history of hypertension, prostate cancer, hyperlipidemia, persistent A-fib on Eliquis, CKD, stroke, current gastric adenocarcinoma undergoing chemotherapy treatment, last treatment Thursday presents to the ER with sudden onset of epigastric pain early this morning.  Pain seems to wax and wane.  No aggravating or alleviating factors.  He has felt nauseous but had no vomiting.  He was seen here on 6/24 with similar complaint, which did not show any acute findings.  He received an enema and felt better and was discharged.  Patient states that his last bowel movement was yesterday though he did not have a bowel movement prior to yesterday for 6 days.  He denies any hematochezia.  Reports his bowel movement was normal yesterday.  He denies any fevers or chills.  No chest pain, back pain.  He underwent a chemotherapy treatment on Thursday and tolerated it well.  He denies any urinary complaints.  Denies any syncope or near syncope.  Home Medications Prior to Admission medications   Medication Sig Start Date End Date Taking? Authorizing Provider  amLODipine (NORVASC) 10 MG tablet Take 10 mg by mouth daily. 06/01/19   [provider]  apixaban (ELIQUIS) 5 MG TABS tablet Take 2.5 mg by mouth 2 (two) times daily.    [provider]  atorvastatin (LIPITOR) 40 MG tablet Take 1 tablet (40 mg total) by mouth at bedtime. 08/17/19   Garvin Fila, MD  glycerin adult 2 g suppository Place 1 suppository rectally as needed for constipation. 12/22/21   Regan Lemming, MD  lidocaine-prilocaine (EMLA) cream Apply to affected area once 11/23/21   [name], MD  Multiple Vitamin (MULTI VITAMIN DAILY PO) Take 1 tablet by mouth daily.    [provider]  ondansetron (ZOFRAN) 8 MG tablet Take 1 tablet (8 mg total) by mouth 2 (two) times daily as needed for refractory nausea / vomiting. Start on day 3 after chemotherapy. 11/23/21   [name], MD  prochlorperazine (COMPAZINE) 10 MG tablet Take 1 tablet (10 mg total) by mouth every 6 (six) hours as needed (Nausea or vomiting). 11/23/21   [name], MD      Allergies    Penicillins    Review of Systems   Review of Systems Ten systems reviewed and are negative for acute change, except as noted in the HPI.   Physical Exam Updated Vital Signs BP (!) 141/48   Pulse (!) 108   Temp 97.9 F (36.6 C) (Oral)   Resp 11   SpO2 97%  Physical Exam Constitutional:      Appearance: Normal appearance.  HENT:     Head: Normocephalic and atraumatic.  Eyes:     Extraocular Movements: Extraocular movements intact.     Conjunctiva/sclera: Conjunctivae normal.     Pupils: Pupils are equal, round, and reactive to light.  Cardiovascular:     Rate and Rhythm: Normal rate and regular rhythm.     Pulses: Normal pulses.     Heart sounds: Normal heart sounds.  Pulmonary:     Effort: Pulmonary effort is normal.     Breath sounds: Normal breath sounds.  Abdominal:     General: Abdomen is flat. Bowel sounds are normal. There is no distension.  Palpations: Abdomen is soft. There is no mass.     Tenderness: There is abdominal tenderness in the epigastric area. There is no right CVA tenderness, left CVA tenderness, guarding or rebound. Negative signs include Murphy's sign and McBurney's sign.  Musculoskeletal:        General: Normal range of motion.     Cervical back: Normal range of motion. No tenderness.  Skin:    General: Skin is warm and dry.  Neurological:     General: No focal deficit present.     Mental Status: He is alert and oriented to person, place, and time.  Psychiatric:        Mood and Affect: Mood normal.        Behavior: Behavior normal.     ED Results / Procedures /  Treatments   Labs (all labs ordered are listed, but only abnormal results are displayed) Labs Reviewed  CBC WITH DIFFERENTIAL/PLATELET - Abnormal; Notable for the following components:      Result Value   WBC 1.2 (*)    RBC 4.15 (*)    Hemoglobin 12.9 (*)    Neutro Abs 1.0 (*)    Lymphs Abs 0.2 (*)    Monocytes Absolute 0.0 (*)    All other components within normal limits  COMPREHENSIVE METABOLIC PANEL - Abnormal; Notable for the following components:   Glucose, Bld 178 (*)    BUN 26 (*)    Creatinine, Ser 1.45 (*)    Albumin 2.9 (*)    ALT 62 (*)    GFR, Estimated 46 (*)    All other components within normal limits  LACTIC ACID, PLASMA - Abnormal; Notable for the following components:   Lactic Acid, Venous 4.1 (*)    All other components within normal limits  LACTIC ACID, PLASMA - Abnormal; Notable for the following components:   Lactic Acid, Venous 4.3 (*)    All other components within normal limits  CULTURE, BLOOD (ROUTINE X 2)  CULTURE, BLOOD (ROUTINE X 2)  CULTURE, BLOOD (ROUTINE X 2) W REFLEX TO ID PANEL  LIPASE, BLOOD  URINALYSIS, ROUTINE W REFLEX MICROSCOPIC  LACTIC ACID, PLASMA  TROPONIN I (HIGH SENSITIVITY)  TROPONIN I (HIGH SENSITIVITY)    EKG EKG Interpretation  Date/Time:  Monday December 31 2021 11:23:55 EDT Ventricular Rate:  95 PR Interval:    QRS Duration: 112 QT Interval:  340 QTC Calculation: 428 R Axis:   -38 Text Interpretation: Atrial fibrillation Ventricular premature complex Confirmed by Lennice Sites ([id number]) on 12/31/2021 12:13:25 PM  Radiology DG Chest Portable 1 View  Result Date: 12/31/2021 CLINICAL DATA:  Provided history: Epigastric pain, rule out infection. EXAM: PORTABLE CHEST 1 VIEW COMPARISON:  Chest radiograph 09/12/2021 (images available, report unavailable). FINDINGS: Right chest infusion port catheter with tip projecting at the level of the superior cavoatrial junction. Prior median sternotomy/CABG. Heart size at the upper limits of  normal. Heart size at the upper limits of normal. No appreciable airspace consolidation or pulmonary edema. No evidence of pleural effusion or pneumothorax. No acute bony abnormality identified. IMPRESSION: Right chest infusion port catheter with tip at the level of the superior cavoatrial junction. No evidence of acute cardiopulmonary abnormality. Aortic Atherosclerosis (ICD10-I70.0). Electronically Signed   By: Kellie Simmering D.O.   On: 12/31/2021 11:58    Procedures .Critical Care  Performed by: Garald Balding, PA-C Authorized by: Garald Balding, PA-C   Critical care provider statement:    Critical care time (minutes):  30  Critical care was time spent personally by me on the following activities:  Development of treatment plan with patient or surrogate, discussions with consultants, evaluation of patient's response to treatment, examination of patient, ordering and review of laboratory studies, ordering and review of radiographic studies, ordering and performing treatments and interventions, pulse oximetry, re-evaluation of patient's condition and review of old charts    Medications Ordered in ED Medications  fentaNYL (SUBLIMAZE) injection 50 mcg (50 mcg Intravenous Given 12/31/21 1003)  ondansetron (ZOFRAN) injection 4 mg (4 mg Intravenous Given 12/31/21 1003)  lactated ringers bolus 1,000 mL (0 mLs Intravenous Stopped 12/31/21 1229)  vancomycin (VANCOREADY) IVPB 1750 mg/350 mL (0 mg Intravenous Stopped 12/31/21 1411)  ceFEPIme (MAXIPIME) 2 g in sodium chloride 0.9 % 100 mL IVPB (0 g Intravenous Stopped 12/31/21 1226)  lactated ringers bolus 1,600 mL (0 mLs Intravenous Stopped 12/31/21 1411)  iohexol (OMNIPAQUE) 350 MG/ML injection 80 mL (80 mLs Intravenous Contrast Given 12/31/21 1247)    ED Course/ Medical Decision Making/ A&P                           Medical Decision Making Amount and/or Complexity of Data Reviewed Labs: ordered. Radiology: ordered.  Risk Prescription drug  management.  88 year old male presenting with sudden onset epigastric pain as of this morning.  On arrival, he is hypertensive but otherwise hemodynamically stable.  Differential diagnosis includes constipation, cholecystitis, pancreatitis, diverticulitis, GERD, SBO, mesenteric ischemia, bowel perforation.  I ordered, reviewed and interpreted his lab work, CBC with a leukopenia 1.2, neutrophil count of 1.  Hemoglobin of 12.9.  CMP without any significant Electra normalities, creatinine 1.45 which is stable, mild ALT elevation of 62.  His lactic acid is 4.1, and after receiving half of his sepsis fluids repeat was 4.3.  Lipase is normal.  Initial troponin is negative.  Blood culture sent.  I ordered, reviewed and interpreted imaging, agree with radiology read.  Chest x-ray unremarkable.  CTA with positive for free intraperitoneal air, consistent with perforated viscus, site indeterminate.  Patient was given IV cefepime and vancomycin, 2.6 L of LR per sepsis protocol, fentanyl for pain.  Informed the patient of the CT findings, however the patient is very hard of hearing and I am not sure the patient has good insight on the gravity of his condition.  Patient was informed that his perforation could be life-ending and he may not be deemed a surgical candidate and/or not survive surgery and could be faced with comfort care measures. Pt responds " if I could just get a laxative I'll feel better".  General surgery consulted who will come see and evaluate the patient.  I spoke with the patient's niece [facility], who recommended that I call his significant other as she is his closest loved one in town.  I called the phone number which unfortunately went to voicemail, voicemail was left with callback number.  Melissa will contact the patient's daughters who live out of state, recommended that they make arrangements to come be at their father's bedside.  She states that his daughters are listed in his living well as  power of attorney.  I spoke with Reine Just, his youngest daughter who lives in [place] over the phone.  She will be making arrangements to come be with her father today, but states that he has clearly stated in his living will that he would wish to be a DNR, he does not want chest compressions or  intubation.  CODE STATUS changed in EMR per her permission.  Palliative care also consulted for engagement of end-of-life discussions and delineation of further plan of care.  I believe the patient would benefit from admission for further palliative management of the patient's condition.  Consulted hospitalist for admission.  This was a shared visit with my supervising physician Dr. Ronnald Nian who independently saw and evaluated the patient & provided guidance in evaluation/management/disposition ,in agreement with care    Final Clinical Impression(s) / ED Diagnoses Final diagnoses:  Perforated viscus    Rx / DC Orders ED Discharge Orders     None         Garald Balding, PA-C 12/31/21 [place], [place], DO 12/31/21 1516

## 2022-01-01 ENCOUNTER — Encounter (HOSPITAL_COMMUNITY): Payer: Self-pay | Admitting: General Surgery

## 2022-01-01 ENCOUNTER — Inpatient Hospital Stay (HOSPITAL_COMMUNITY): Payer: Medicare Other

## 2022-01-01 DIAGNOSIS — G893 Neoplasm related pain (acute) (chronic): Secondary | ICD-10-CM

## 2022-01-01 DIAGNOSIS — K255 Chronic or unspecified gastric ulcer with perforation: Secondary | ICD-10-CM | POA: Diagnosis not present

## 2022-01-01 LAB — COMPREHENSIVE METABOLIC PANEL
ALT: 81 U/L — ABNORMAL HIGH (ref 0–44)
AST: 98 U/L — ABNORMAL HIGH (ref 15–41)
Albumin: 2.2 g/dL — ABNORMAL LOW (ref 3.5–5.0)
Alkaline Phosphatase: 55 U/L (ref 38–126)
Anion gap: 13 (ref 5–15)
BUN: 33 mg/dL — ABNORMAL HIGH (ref 8–23)
CO2: 18 mmol/L — ABNORMAL LOW (ref 22–32)
Calcium: 8.1 mg/dL — ABNORMAL LOW (ref 8.9–10.3)
Chloride: 107 mmol/L (ref 98–111)
Creatinine, Ser: 2.2 mg/dL — ABNORMAL HIGH (ref 0.61–1.24)
GFR, Estimated: 28 mL/min — ABNORMAL LOW (ref >60)
Glucose, Bld: 167 mg/dL — ABNORMAL HIGH (ref 70–99)
Potassium: 4.8 mmol/L (ref 3.5–5.1)
Sodium: 138 mmol/L (ref 135–145)
Total Bilirubin: 0.9 mg/dL (ref 0.3–1.2)
Total Protein: 4.9 g/dL — ABNORMAL LOW (ref 6.5–8.1)

## 2022-01-01 LAB — BLOOD GAS, ARTERIAL
Acid-base deficit: 6 mmol/L — ABNORMAL HIGH (ref 0.0–2.0)
Bicarbonate: 16.9 mmol/L — ABNORMAL LOW (ref 20.0–28.0)
O2 Content: 3.5 L/min
O2 Saturation: 98.5 %
Patient temperature: 37
pCO2 arterial: 26 mmHg — ABNORMAL LOW (ref 32–48)
pH, Arterial: 7.42 (ref 7.35–7.45)
pO2, Arterial: 98 mmHg (ref 83–108)

## 2022-01-01 LAB — CBC
HCT: 35.6 % — ABNORMAL LOW (ref 39.0–52.0)
HCT: 33.5 % — ABNORMAL LOW (ref 39.0–52.0)
HCT: 32.3 % — ABNORMAL LOW (ref 39.0–52.0)
HCT: 31.4 % — ABNORMAL LOW (ref 39.0–52.0)
Hemoglobin: 11.7 g/dL — ABNORMAL LOW (ref 13.0–17.0)
Hemoglobin: 11.4 g/dL — ABNORMAL LOW (ref 13.0–17.0)
Hemoglobin: 11.2 g/dL — ABNORMAL LOW (ref 13.0–17.0)
Hemoglobin: 10.7 g/dL — ABNORMAL LOW (ref 13.0–17.0)
MCH: 31 pg (ref 26.0–34.0)
MCH: 31.1 pg (ref 26.0–34.0)
MCH: 31.3 pg (ref 26.0–34.0)
MCH: 31.2 pg (ref 26.0–34.0)
MCHC: 32.9 g/dL (ref 30.0–36.0)
MCHC: 34 g/dL (ref 30.0–36.0)
MCHC: 34.7 g/dL (ref 30.0–36.0)
MCHC: 34.1 g/dL (ref 30.0–36.0)
MCV: 94.4 fL (ref 80.0–100.0)
MCV: 91.3 fL (ref 80.0–100.0)
MCV: 90.2 fL (ref 80.0–100.0)
MCV: 91.5 fL (ref 80.0–100.0)
Platelets: 233 10*3/uL (ref 150–400)
Platelets: 217 10*3/uL (ref 150–400)
Platelets: 212 10*3/uL (ref 150–400)
Platelets: 175 10*3/uL (ref 150–400)
RBC: 3.77 MIL/uL — ABNORMAL LOW (ref 4.22–5.81)
RBC: 3.67 MIL/uL — ABNORMAL LOW (ref 4.22–5.81)
RBC: 3.58 MIL/uL — ABNORMAL LOW (ref 4.22–5.81)
RBC: 3.43 MIL/uL — ABNORMAL LOW (ref 4.22–5.81)
RDW: 14 % (ref 11.5–15.5)
RDW: 14 % (ref 11.5–15.5)
RDW: 14 % (ref 11.5–15.5)
RDW: 13.9 % (ref 11.5–15.5)
WBC: 4.7 10*3/uL (ref 4.0–10.5)
WBC: 8 10*3/uL (ref 4.0–10.5)
WBC: 11 10*3/uL — ABNORMAL HIGH (ref 4.0–10.5)
WBC: 14.1 10*3/uL — ABNORMAL HIGH (ref 4.0–10.5)
nRBC: 0 % (ref 0.0–0.2)
nRBC: 0 % (ref 0.0–0.2)
nRBC: 0 % (ref 0.0–0.2)
nRBC: 0 % (ref 0.0–0.2)

## 2022-01-01 LAB — MAGNESIUM: Magnesium: 1.8 mg/dL (ref 1.7–2.4)

## 2022-01-01 LAB — PROTIME-INR
INR: 1.5 — ABNORMAL HIGH (ref 0.8–1.2)
Prothrombin Time: 17.6 seconds — ABNORMAL HIGH (ref 11.4–15.2)

## 2022-01-01 LAB — LACTIC ACID, PLASMA
Lactic Acid, Venous: 5.5 mmol/L (ref 0.5–1.9)
Lactic Acid, Venous: 4.6 mmol/L (ref 0.5–1.9)

## 2022-01-01 MED ORDER — ORAL CARE MOUTH RINSE
15.0000 mL | OROMUCOSAL | Status: DC
Start: 2022-01-01 — End: 2022-01-01
  Administered 2022-01-01: 15 mL via OROMUCOSAL

## 2022-01-01 MED ORDER — AMIODARONE LOAD VIA INFUSION
150.0000 mg | Freq: Once | INTRAVENOUS | Status: AC
  Administered 2022-01-01: 150 mg via INTRAVENOUS
  Filled 2022-01-01: qty 83.34

## 2022-01-01 MED ORDER — AMIODARONE HCL IN DEXTROSE 360-4.14 MG/200ML-% IV SOLN
60.0000 mg/h | INTRAVENOUS | Status: AC
  Administered 2022-01-01 (×2): 60 mg/h via INTRAVENOUS
  Filled 2022-01-01 (×2): qty 200

## 2022-01-01 MED ORDER — STERILE WATER FOR INJECTION IV SOLN
INTRAVENOUS | Status: DC
  Filled 2022-01-01: qty 1000
  Filled 2022-01-01: qty 150

## 2022-01-01 MED ORDER — MAGNESIUM SULFATE 2 GM/50ML IV SOLN
2.0000 g | Freq: Once | INTRAVENOUS | Status: AC
  Administered 2022-01-01: 2 g via INTRAVENOUS
  Filled 2022-01-01: qty 50

## 2022-01-01 MED ORDER — SODIUM CHLORIDE 0.9 % IV SOLN: INTRAVENOUS | Status: DC

## 2022-01-01 MED ORDER — ORAL CARE MOUTH RINSE: 15.0000 mL | OROMUCOSAL | Status: DC | PRN

## 2022-01-01 MED ORDER — AMIODARONE HCL IN DEXTROSE 360-4.14 MG/200ML-% IV SOLN
30.0000 mg/h | INTRAVENOUS | Status: DC
  Administered 2022-01-02 – 2022-01-09 (×15): 30 mg/h via INTRAVENOUS
  Filled 2022-01-01 (×16): qty 200

## 2022-01-01 MED ORDER — LACTATED RINGERS IV BOLUS
500.0000 mL | Freq: Once | INTRAVENOUS | Status: AC
  Administered 2022-01-01: 500 mL via INTRAVENOUS

## 2022-01-01 MED ORDER — SODIUM CHLORIDE 0.9 % IV BOLUS
500.0000 mL | Freq: Once | INTRAVENOUS | Status: AC
  Administered 2022-01-01: 500 mL via INTRAVENOUS

## 2022-01-01 MED ORDER — PHENOL 1.4 % MT LIQD
2.0000 | OROMUCOSAL | Status: DC | PRN
Start: 2022-01-01 — End: 2022-01-22
  Filled 2022-01-01: qty 177

## 2022-01-01 NOTE — Progress Notes (Addendum)
eLink Physician-Brief Progress Note Patient Name: Dillon Smith DOB: 11/29/1929 MRN: [id number]   Date of Service  01/01/2022  HPI/Events of Note  Notified of LA 5.5, Mg 1.8. Crea continues to rise now at 2.2  Lactate also not clearing.    eICU Interventions  Replete Mg.  Check ABG.  Start on HCO3 gtt at 125cc/hr. Give 500cc NS bolus. RN to notify surgery of rising lactate.  Check CVP.     Intervention Category Intermediate Interventions: Diagnostic test evaluation  [name] 01/01/2022, 4:31 AM  5:02 AM ABG 7.42/26/98.  Plan> HCO3 gtt just started.  Decrease rate to 50cc/hr.  Restart NS @ 100cc/hr.   6:03 AM CVP at 5.  BP much better now at 156/50.  Plan> Continue to trend lactate.

## 2022-01-01 NOTE — Progress Notes (Signed)
NAME:  Dillon Smith, MRN:  [id number], DOB:  [birth year], LOS: 1 ADMISSION DATE:  12/31/2021, CONSULTATION DATE:  12/31/21 REFERRING MD:  Marlou Starks - CCS , CHIEF COMPLAINT:  abdominal pain  History of Present Illness:  88 yo M with stage IIB gastric adenocarcinoma currently receiving chemo, Afib on eliquis, CKD III, presented [facility] ED 7/3 with abdominal pain.  CT a/p in ED was significant for intraabdominal free air, concerning for bowel perforation. He was posted urgently for ex lap this afternoon with plan for ICU admission post op  PCCM consulted in this setting   Pertinent  Medical History   Stage IIB gastric adenocarcinoma  CAD HTN Biventricular heart failure  CVA CKD III Afib Prostate cancer   Significant Hospital Events: Including procedures, antibiotic start and stop dates in addition to other pertinent events   7/3 ED with abdominal pain. Ex lap for perf in setting fo gastric cancer, started on vanc, zosyn  Interim History / Subjective:   NS infusion discontinued overnight, bicarb infusion started. 500cc NS bolus.  Intermittent fentanyl overnight for pain control, has some abdominal pain.  Objective   Blood pressure 128/80, pulse 100, temperature 98.1 F (36.7 C), temperature source Oral, resp. rate (!) 38, height '5\' 11"'$  (1.803 m), weight 84.7 kg, SpO2 90 %. CVP:  [5 mmHg] 5 mmHg      Intake/Output Summary (Last 24 hours) at 01/01/2022 0809 Last data filed at 01/01/2022 2355 Gross per 24 hour  Intake 5200.92 ml  Output 1110 ml  Net 4090.92 ml   Filed Weights   12/31/21 2015  Weight: 84.7 kg    Examination: General appearance: 88 y.o., male, a little uncomfortable  Eyes: tracking HENT: NCAT; dry MM Lungs: CTAB, with normal respiratory effort CV: IRIR, no murmur  Abdomen: Soft, appropriately ttp; non-distended, BS hypoactive Extremities: No peripheral edema, warm Skin: Normal turgor and texture; no rash Neuro: Alert and oriented to situation, follows  commands  Labs/imaging reviewed   Resolved Hospital Problem list     Assessment & Plan:   Perforated Gastric Cancer-- POD 1 ex lap,  intraabdominal abscess drainage Gastric adenocarcinoma  ABLA EBL 450, small hepatic injury. 2 blakes placed in OR.  P -post op per CCS -serial CBCs -zosyn, follow cultures and narrow as able  Lactic acidosis May be multifactorial - mounting stress response to surgery, sepsis due to intraabdominal infection, possibly sign of acute blood loss but seems relatively HDS, nutritional component.  -trend -give another 500cc LR, continue bicarb infusion as below  Acute encephalopathy -expect this to clear as related to anesthetic, have discontinued cefepime -correct other metabolic abnormalities as able   Afib on eliquis -holding eliquis post op -cardiac monitoring -optimize lytes  AKI on CKD III  -trend BMP, f/u response to fluid resuscitation -bicarb infusion -follow I/O closely  Neutropenia  -zosyn as above    Best Practice (right click and "Reselect all SmartList Selections" daily)   Diet/type: NPO DVT prophylaxis: SCD GI prophylaxis: N/A Lines: Central line, arterial line  Foley:  N/A Code Status:  full code Last date of multidisciplinary goals of care discussion [recommend palliative care involvement - he now wishes to remain full code which is consistent with prior documentation barring initial admission code status which was reversed for surgery. He seemed to understand that if he were to decompensate to point of needing intubation or CPR/shocks that in setting of his perforated gastric cancer, multimorbidity, malnourishment, advanced age that it would be extraordinarily unlikely for him  to recover.]  Critical care time: n/a

## 2022-01-01 NOTE — Progress Notes (Signed)
Patient noted to be extremely uncomfortable. Patient is continually moaning and groaning, attempting to clear his throat, restless, and attempting to oral suction himself. Patient with some success. Fentanyl given to assist patient with comfort. Patient denies nausea at this time. Noted to have a lot of negative vocalizations.

## 2022-01-01 NOTE — Progress Notes (Signed)
Patient noted to be more comfortable at this time. Decrease in coughing, suctioning and negative vocalizations.

## 2022-01-01 NOTE — TOC Initial Note (Signed)
Transition of Care [facility]) - Initial/Assessment Note    Patient Details  Name: Dillon Smith MRN: [id number] Date of Birth: 11/29/1929  Transition of Care [facility]) CM/SW Contact:    Leeroy Cha, RN Phone Number: 01/01/2022, 7:17 AM  Clinical Narrative:                  Transition of Care [facility]) Screening Note   Patient Details  Name: Dillon Smith Date of Birth: 11/29/1929   Transition of Care [facility]) CM/SW Contact:    Leeroy Cha, RN Phone Number: 01/01/2022, 7:17 AM    Transition of Care Department [facility]) has reviewed patient and no TOC needs have been identified at this time. We will continue to monitor patient advancement through interdisciplinary progression rounds. If new patient transition needs arise, please place a TOC consult.    Expected Discharge Plan: Home/Self Care Barriers to Discharge: Continued Medical Work up   Patient Goals and CMS Choice Patient states their goals for this hospitalization and ongoing recovery are:: to go home CMS Medicare.gov Compare Post Acute Care list provided to:: Patient Choice offered to / list presented to : Patient  Expected Discharge Plan and Services Expected Discharge Plan: Home/Self Care   Discharge Planning Services: CM Consult   Living arrangements for the past 2 months: Single Family Home                                      Prior Living Arrangements/Services Living arrangements for the past 2 months: Single Family Home Lives with:: Self Patient language and need for interpreter reviewed:: Yes Do you feel safe going back to the place where you live?: Yes            Criminal Activity/Legal Involvement Pertinent to Current Situation/Hospitalization: No - Comment as needed  Activities of Daily Living Home Assistive Devices/Equipment: Hearing aid ADL Screening (condition at time of admission) Patient's cognitive ability adequate to safely complete daily activities?: Yes Is the patient deaf  or have difficulty hearing?: Yes Does the patient have difficulty seeing, even when wearing glasses/contacts?: No Does the patient have difficulty concentrating, remembering, or making decisions?: No Patient able to express need for assistance with ADLs?: Yes Does the patient have difficulty dressing or bathing?: No Independently performs ADLs?: Yes (appropriate for developmental age) Does the patient have difficulty walking or climbing stairs?: Yes Weakness of Legs: None Weakness of Arms/Hands: None  Permission Sought/Granted                  Emotional Assessment Appearance:: Appears stated age     Orientation: : Oriented to Self, Oriented to Place, Oriented to  Time, Oriented to Situation Alcohol / Substance Use: Not Applicable Psych Involvement: No (comment)  Admission diagnosis:  Perforated viscus [R19.8] 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HCC) [K25.5] Patient Active Problem List   Diagnosis Date Noted   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[place]) 12/31/2021   Port-A-Cath in place 12/13/2021   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) 08/17/2019   Acute ischemic stroke (HCC) mult R MCA and PCA  embolic infarcts d/t AF not on Surgisite [place] s/p tPA 08/15/2019   CARCINOMA, PROSTATE 09/15/2007   Essential hypertension 09/15/2007   Coronary artery disease 09/15/2007   HEMORRHOIDS, INTERNAL 09/15/2007   RADIATION PROCTITIS 09/15/2007   PCP:  Clinic, [place]:   [place], [place] - [address]  [place] [address] Phone: [phone] Fax: [phone]  [id number] [place] [address]Phone: [phone]: [phone] [id number]: [phone]: [phone]    Social Determinants of Health (SDOH) Interventions    Readmission Risk Interventions     No data to display

## 2022-01-01 NOTE — Progress Notes (Addendum)
Called into room by patient. Patient stated to RN that when he coughed, his NG tube came up. Large look of NG tube noted in patient's mouth. NG tube removed and reinserted to previous position with positive return and auscultation. Patient complaining of discomfort to nare due to NG tube.

## 2022-01-01 NOTE — Progress Notes (Signed)
Assessment & Plan: POD#1 - status post ex lap with drainage intra-abd abscess - Dr. Marlou Starks 12/31/2021  NPO, NG, IVF  IV Zosyn, Vanco  Drains x 2 in upper abdomen  CCM on board - continue resuscitation - lactate elevated this AM  Patient awake this AM.  Wants to get up.        Dillon Gemma, MD [facility] [place] practice Office: [phone]        Chief Complaint: Probable gastric perforation, gastric Ca  Subjective: Patient responsive, in ICU. NG in place.  Objective: Vital signs in last 24 hours: Temp:  [97.4 F (36.3 C)-98.5 F (36.9 C)] 98.1 F (36.7 C) (07/04 0300) Pulse Rate:  [28-127] 78 (07/04 0600) Resp:  [11-35] 20 (07/04 0600) BP: (91-156)/(41-111) 156/50 (07/04 0600) SpO2:  [92 %-100 %] 93 % (07/04 0600) Arterial Line BP: (93-136)/(40-78) 93/78 (07/04 0600) Weight:  [84.7 kg] 84.7 kg (07/03 2015) Last BM Date : 12/30/21  Intake/Output from previous day: 07/03 0701 - 07/04 0700 In: 5200.9 [I.V.:4065.4; IV Piggyback:1135.6] Out: 1110 [Urine:315; Emesis/NG output:200; Drains:145; Blood:450] Intake/Output this shift: No intake/output data recorded.  Physical Exam: HEENT - sclerae clear, mucous membranes moist Neck - soft Abdomen - soft, dressing dry and intact; JP's with serous output   Lab Results:  Recent Labs    12/31/21 2127 01/01/22 0250  WBC 2.4* 4.7  HGB 11.8* 11.7*  HCT 35.8* 35.6*  PLT 274 233   BMET Recent Labs    12/31/21 2127 01/01/22 0250  NA 138 138  K 4.6 4.8  CL 108 107  CO2 20* 18*  GLUCOSE 178* 167*  BUN 31* 33*  CREATININE 1.66* 2.20*  CALCIUM 8.5* 8.1*   PT/INR Recent Labs    12/31/21 2127 01/01/22 0250  LABPROT 17.4* 17.6*  INR 1.4* 1.5*   Comprehensive Metabolic Panel:    Component Value Date/Time   NA 138 01/01/2022 0250   NA 138 12/31/2021 2127   NA 143 02/06/2021 0845   NA 141 07/06/2020 1156   K 4.8 01/01/2022 0250   K 4.6 12/31/2021 2127   CL 107 01/01/2022 0250   CL 108  12/31/2021 2127   CO2 18 (L) 01/01/2022 0250   CO2 20 (L) 12/31/2021 2127   BUN 33 (H) 01/01/2022 0250   BUN 31 (H) 12/31/2021 2127   BUN 23 02/06/2021 0845   BUN 22 07/06/2020 1156   CREATININE 2.20 (H) 01/01/2022 0250   CREATININE 1.66 (H) 12/31/2021 2127   CREATININE 1.57 (H) 12/13/2021 0827   CREATININE 1.46 (H) 11/23/2021 0905   GLUCOSE 167 (H) 01/01/2022 0250   GLUCOSE 178 (H) 12/31/2021 2127   CALCIUM 8.1 (L) 01/01/2022 0250   CALCIUM 8.5 (L) 12/31/2021 2127   AST 98 (H) 01/01/2022 0250   AST 94 (H) 12/31/2021 2127   AST 18 12/13/2021 0827   AST 21 11/23/2021 0905   ALT 81 (H) 01/01/2022 0250   ALT 79 (H) 12/31/2021 2127   ALT 17 12/13/2021 0827   ALT 20 11/23/2021 0905   ALKPHOS 55 01/01/2022 0250   ALKPHOS 56 12/31/2021 2127   BILITOT 0.9 01/01/2022 0250   BILITOT 0.7 12/31/2021 2127   BILITOT 0.5 12/13/2021 0827   BILITOT 0.4 11/23/2021 0905   PROT 4.9 (L) 01/01/2022 0250   PROT 5.0 (L) 12/31/2021 2127   PROT 6.4 02/06/2021 0845   PROT 7.2 07/06/2020 1156   ALBUMIN 2.2 (L) 01/01/2022 0250   ALBUMIN 2.3 (L) 12/31/2021 2127  ALBUMIN 3.9 02/06/2021 0845   ALBUMIN 4.3 07/06/2020 1156    Studies/Results: DG Abd 1 View  Result Date: 01/01/2022 CLINICAL DATA:  Feeding tube placement. EXAM: ABDOMEN - 1 VIEW COMPARISON:  Radiograph yesterday. FINDINGS: The enteric tube has been advanced, tip and side-port are below the diaphragm. Abdominal drains remain in place. IMPRESSION: Enteric tube with tip and side-port below the diaphragm in the stomach. Electronically Signed   By: Keith Rake M.D.   On: 01/01/2022 00:52   DG Abd 1 View  Result Date: 01/01/2022 CLINICAL DATA:  Feeding tube placement EXAM: ABDOMEN - 1 VIEW COMPARISON:  CT 12/31/2021 FINDINGS: Esophageal tube tip overlies the stomach, side-port near the GE junction. Probable drains over the upper abdomen. Faintly visible gastrojejunal stent. Upper gas pattern is unobstructed. IMPRESSION: Esophageal tube tip  overlies stomach, side-port in the region of GE junction, further advancement could be considered for more optimal positioning Electronically Signed   By: Donavan Foil M.D.   On: 01/01/2022 00:11   CT Angio Abd/Pel W and/or Wo Contrast  Result Date: 12/31/2021 CLINICAL DATA:  Mesenteric ischemia, abdominal pain EXAM: CTA ABDOMEN AND PELVIS WITHOUT AND WITH CONTRAST TECHNIQUE: Multidetector CT imaging of the abdomen and pelvis was performed using the standard protocol during bolus administration of intravenous contrast. Multiplanar reconstructed images and MIPs were obtained and reviewed to evaluate the vascular anatomy. RADIATION DOSE REDUCTION: This exam was performed according to the departmental dose-optimization program which includes automated exposure control, adjustment of the mA and/or kV according to patient size and/or use of iterative reconstruction technique. CONTRAST:  57m OMNIPAQUE IOHEXOL 350 MG/ML SOLN COMPARISON:  CT 12/22/2021 FINDINGS: VASCULAR Aorta: Moderate calcified atheromatous plaque throughout. No dissection or stenosis. Fusiform 3.7 cm infrarenal aneurysm tapering to normal caliber above the bifurcation nonocclusive mural thrombus in the aneurysmal segment. Celiac: Calcified ostial plaque resulting in short segment stenosis of at least mild hemodynamic significance. Branch vessels unremarkable. SMA: Calcified ostial plaque resulting in short segment stenosis of at least mild severity, mildly atheromatous but patent distally. Renals: Single left, with partially calcified ostial plaque resulting in short segment stenosis of at least mild severity, patent distally. Single right, mildly atheromatous but patent. IMA: Patent, arising from the distal aspect of the aneurysmal segment. Inflow: Fusiform dilatation of distal right common iliac artery up to 2 cm diameter, left 1.4 cm. Moderate scattered calcified atheromatous plaque throughout bilateral iliac arterial systems. Proximal Outflow:  Atheromatous, patent Veins: Patent hepatic veins, portal vein, SMV, splenic vein, bilateral renal veins. No mesenteric or portal venous gas. Review of the MIP images confirms the above findings. NON-VASCULAR Lower chest: No pleural or pericardial effusion. Minimal interstitial infiltrate or dependent atelectasis posteriorly at the right lung base. Sternotomy wires. Central venous catheter extends to the proximal right atrium. Hepatobiliary: 1.4 cm hyperdense lesion in hepatic segment 5. Gallbladder nondistended. No calcified gallstones. No biliary ductal dilatation. Pancreas: Unremarkable. No pancreatic ductal dilatation or surrounding inflammatory changes. Spleen: Normal in size without focal abnormality. Adrenals/Urinary Tract: 3.2 cm enhancing left adrenal mass, stable. Bilateral renal cysts stable. No hydronephrosis. Urinary bladder physiologically distended. Stomach/Bowel: Stomach is decompressed. Patent gastroduodenal bypass device. Poorly marginated 3.6 cm enhancing antral mass. The duodenum is decompressed. Small bowel is nondilated. Normal appendix. The colon is incompletely distended, unremarkable. No pneumatosis. No mesenteric venous gas. Lymphatic: No abdominal or pelvic adenopathy. Reproductive: Metallic seeds throughout the prostate. Other: Small volume abdominal and pelvic ascites. Scattered free air in the anterior upper abdomen. Musculoskeletal: Spondylitic changes in the  lower lumbar spine. IMPRESSION: 1. Positive for free intraperitoneal air, consistent with perforated viscus, site indeterminate. Critical Value/emergent results were called by telephone at the time of interpretation on 12/31/2021 at 1:57 pm to provider [facility] , who verbally acknowledged these results. 2. 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 3. 3.7 cm infrarenal abdominal aortic aneurysm. Consensus guidelines recommend follow-up ultrasound every 2 years. Reference: Joellyn Rued Radiol  2013; 10:789-794. Electronically Signed   By: Lucrezia Europe M.D.   On: 12/31/2021 14:03   DG Chest Portable 1 View  Result Date: 12/31/2021 CLINICAL DATA:  Provided history: Epigastric pain, rule out infection. EXAM: PORTABLE CHEST 1 VIEW COMPARISON:  Chest radiograph 09/12/2021 (images available, report unavailable). FINDINGS: Right chest infusion port catheter with tip projecting at the level of the superior cavoatrial junction. Prior median sternotomy/CABG. Heart size at the upper limits of normal. Heart size at the upper limits of normal. No appreciable airspace consolidation or pulmonary edema. No evidence of pleural effusion or pneumothorax. No acute bony abnormality identified. IMPRESSION: Right chest infusion port catheter with tip at the level of the superior cavoatrial junction. No evidence of acute cardiopulmonary abnormality. Aortic Atherosclerosis (ICD10-I70.0). Electronically Signed   By: Kellie Simmering D.O.   On: 12/31/2021 11:58      Dillon Smith 01/01/2022   Patient ID: Dillon Smith, male   DOB: 11/29/1929, 88 y.o.   MRN: [id number]

## 2022-01-01 NOTE — Progress Notes (Signed)
eLink Physician-Brief Progress Note Patient Name: Dillon Smith DOB: May 26, 1930 MRN: [id number]   Date of Service  01/01/2022  HPI/Events of Note  Notified of scleral edema and more prominent temporal vessels.    BP 103/68, HR 108, O2 sats 97%.  Pt wakes up to stimuli.  He is able to follow commands.   Last pH 7.4.  eICU Interventions  Discontinue HCO3 gtt.  Decrease NS to 75cc/hr.      Intervention Category Intermediate Interventions: Other:  [name] 01/01/2022, 10:11 PM

## 2022-01-01 NOTE — Progress Notes (Signed)
eLink Physician-Brief Progress Note Patient Name: Dillon Smith DOB: 11/29/1929 MRN: [id number]   Date of Service  01/01/2022  HPI/Events of Note  Esophageal tube overlies the stomach, side-port in the region of GE junction.  This was advanced and placement is better on repeat KUB.   Pt is complaining of throat pain and feels discomfort in the pharynx, perhaps from the NGT placement and vomiting.   eICU Interventions  Trial of chloraseptic spray.  Fentanyl also given PRN.      Intervention Category Intermediate Interventions: Diagnostic test evaluation  [name] 01/01/2022, 12:55 AM

## 2022-01-02 DIAGNOSIS — K255 Chronic or unspecified gastric ulcer with perforation: Secondary | ICD-10-CM | POA: Diagnosis not present

## 2022-01-02 LAB — CBC
HCT: 29.2 % — ABNORMAL LOW (ref 39.0–52.0)
Hemoglobin: 10.2 g/dL — ABNORMAL LOW (ref 13.0–17.0)
MCH: 31.2 pg (ref 26.0–34.0)
MCHC: 34.9 g/dL (ref 30.0–36.0)
MCV: 89.3 fL (ref 80.0–100.0)
Platelets: 146 10*3/uL — ABNORMAL LOW (ref 150–400)
RBC: 3.27 MIL/uL — ABNORMAL LOW (ref 4.22–5.81)
RDW: 13.9 % (ref 11.5–15.5)
WBC: 15 10*3/uL — ABNORMAL HIGH (ref 4.0–10.5)
nRBC: 0 % (ref 0.0–0.2)

## 2022-01-02 LAB — BLOOD CULTURE ID PANEL (REFLEXED) - BCID2

## 2022-01-02 LAB — BASIC METABOLIC PANEL
Anion gap: 10 (ref 5–15)
BUN: 46 mg/dL — ABNORMAL HIGH (ref 8–23)
CO2: 21 mmol/L — ABNORMAL LOW (ref 22–32)
Calcium: 7.6 mg/dL — ABNORMAL LOW (ref 8.9–10.3)
Chloride: 108 mmol/L (ref 98–111)
Creatinine, Ser: 2.96 mg/dL — ABNORMAL HIGH (ref 0.61–1.24)
GFR, Estimated: 19 mL/min — ABNORMAL LOW (ref >60)
Glucose, Bld: 131 mg/dL — ABNORMAL HIGH (ref 70–99)
Potassium: 4.3 mmol/L (ref 3.5–5.1)
Sodium: 139 mmol/L (ref 135–145)

## 2022-01-02 LAB — GLUCOSE, CAPILLARY
Glucose-Capillary: 113 mg/dL — ABNORMAL HIGH (ref 70–99)
Glucose-Capillary: 136 mg/dL — ABNORMAL HIGH (ref 70–99)
Glucose-Capillary: 140 mg/dL — ABNORMAL HIGH (ref 70–99)
Glucose-Capillary: 149 mg/dL — ABNORMAL HIGH (ref 70–99)

## 2022-01-02 MED ORDER — INSULIN ASPART 100 UNIT/ML IJ SOLN
0.0000 [IU] | INTRAMUSCULAR | Status: AC
  Administered 2022-01-02 – 2022-01-03 (×6): 1 [IU] via SUBCUTANEOUS
  Administered 2022-01-03: 4 [IU] via SUBCUTANEOUS
  Administered 2022-01-03 – 2022-01-04 (×7): 1 [IU] via SUBCUTANEOUS
  Administered 2022-01-05 (×5): 2 [IU] via SUBCUTANEOUS
  Administered 2022-01-05 – 2022-01-06 (×2): 1 [IU] via SUBCUTANEOUS
  Administered 2022-01-06 (×3): 2 [IU] via SUBCUTANEOUS
  Administered 2022-01-06: 1 [IU] via SUBCUTANEOUS
  Administered 2022-01-06: 2 [IU] via SUBCUTANEOUS
  Administered 2022-01-07: 1 [IU] via SUBCUTANEOUS
  Administered 2022-01-07 (×2): 2 [IU] via SUBCUTANEOUS
  Administered 2022-01-07 (×2): 1 [IU] via SUBCUTANEOUS
  Administered 2022-01-07 – 2022-01-08 (×3): 2 [IU] via SUBCUTANEOUS
  Administered 2022-01-08: 1 [IU] via SUBCUTANEOUS
  Administered 2022-01-08: 2 [IU] via SUBCUTANEOUS
  Administered 2022-01-08: 1 [IU] via SUBCUTANEOUS
  Administered 2022-01-08 – 2022-01-09 (×2): 2 [IU] via SUBCUTANEOUS
  Administered 2022-01-09: 1 [IU] via SUBCUTANEOUS
  Administered 2022-01-09: 2 [IU] via SUBCUTANEOUS
  Administered 2022-01-09: 1 [IU] via SUBCUTANEOUS
  Administered 2022-01-09: 2 [IU] via SUBCUTANEOUS

## 2022-01-02 MED ORDER — THIAMINE HCL 100 MG/ML IJ SOLN
100.0000 mg | Freq: Every day | INTRAMUSCULAR | Status: DC
  Administered 2022-01-02 – 2022-01-12 (×11): 100 mg via INTRAVENOUS
  Filled 2022-01-02 (×11): qty 2

## 2022-01-02 MED ORDER — DEXTROSE IN LACTATED RINGERS 5 % IV SOLN: INTRAVENOUS | Status: AC

## 2022-01-02 MED ORDER — PIPERACILLIN-TAZOBACTAM IN DEX 2-0.25 GM/50ML IV SOLN
2.2500 g | Freq: Three times a day (TID) | INTRAVENOUS | Status: DC
Start: 2022-01-02 — End: 2022-01-04
  Administered 2022-01-02 – 2022-01-04 (×6): 2.25 g via INTRAVENOUS
  Filled 2022-01-02 (×6): qty 50

## 2022-01-02 NOTE — Progress Notes (Signed)
PHARMACY NOTE:  ANTIMICROBIAL RENAL DOSAGE ADJUSTMENT  Current antimicrobial regimen includes a mismatch between antimicrobial dosage and estimated renal function.  As per policy approved by the Pharmacy & Therapeutics and Medical Executive Committees, the antimicrobial dosage will be adjusted accordingly.  Current antimicrobial dosage:  Zosyn 3.375 g IV q8h extended infusion  Indication: intra-abdominal infection  Renal Function:  Estimated Creatinine Clearance: 17.7 mL/min (A) (by C-G formula based on SCr of 2.96 mg/dL (H)).     Antimicrobial dosage has been changed to:  Zosyn 2.25 g IV q8h  Additional comments: SCr continues to trend up (baseline appears < 1.5)   Thank you for allowing pharmacy to be a part of this patient's care.  Tawnya Crook, PharmD, BCPS Clinical Pharmacist 01/02/2022 7:19 AM

## 2022-01-02 NOTE — Consult Note (Signed)
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  He was taken for urgent exploratory laparotomy on 7/3 after an extensive discussion was had and documented by general surgery regarding goals of care and risk benefits of proceeding with surgical intervention.  This patient had a very high functional status prior to admission, he was physically strong and independent.  He lives alone in [place] and has 2 daughters who live out of state but are actively involved in his decision making and care.   This patient receives the majority of his primary care through the [facility] system.  Reason for Consultation: Palliative care consult was placed given this patient's advanced age, high risk surgical intervention with the potential for potential postoperative issues such as delirium, functional status decline and need for ongoing goals of care discussion and support.  Assessment and Recommendations:  Mr. Paule is currently postop in the ICU, he awakens easily to my voice he makes good eye contact and can answer simple questions.  Voices no complaints and denies any pain.  He has an NG tube placed and surgical drains.  At this time he is unable to engage in complex conversation regarding his goals of care.  I reviewed the discussions had both by the EDP and general surgery.   At this time it is clear that Mr. Evers desires full scope medical/surgical intervention and treatment for his cancer.  This is a  separate issue from his CODE STATUS which should only be applied in the event that he comes pulseless or stops breathing, in that case I would strongly recommend that we not attempt resuscitation with the use of CPR or intubation-because his outcomes would  be extremely poor and chance of survival less than 1%.  Hopefully when he has improved we can have further conversation about this and clarify his wishes in the orders.  Likely the most important issues for Mr. Funches are his anticipated outcomes beyond just survival but how this illness will impact him in terms of his ability to maintain his independence, live alone, manages own affairs and maintain a high quality of life.  Further Discussion around goals of care and code status depending on how his condition evolves. Follow up in cancer center palliative clinic. Manage and reduce delirium risk. Treat pain with scheduled tylenol (he does not readily complain of pain and may not ask for prn) Bowel regimen as tolerated Assess for urinary retension and remove foley as soon as possible Mobilize early. Maintain day-night schedule, minimize interruption to sleep at night. Will follow as needed for additional palliative support,  Lane Hacker, DO Palliative Medicine  Time:60 min

## 2022-01-02 NOTE — Progress Notes (Signed)
PHARMACY - PHYSICIAN COMMUNICATION CRITICAL VALUE ALERT - BLOOD CULTURE IDENTIFICATION (BCID)  Dillon Smith is an 88 y.o. male who presented [facility] on 12/31/2021 with a chief complaint of abdominal pain. He is post-op intra-abdominal abscess drainage.  Assessment:  1/4 bacteroides fragilis  Name of physician (or Provider) Contacted:  Secure chat to Dr. Verlee Monte, Dr. Marlou Starks Patient also discussed on ICU interdisciplinary rounds  Current antibiotics: Zosyn  Changes to prescribed antibiotics recommended: none  Results for orders placed or performed during the hospital encounter of 12/31/21  Blood Culture ID Panel (Reflexed) (Collected: 12/31/2021 12:15 PM)  Result Value Ref Range   Enterococcus faecalis NOT DETECTED NOT DETECTED   Enterococcus Faecium NOT DETECTED NOT DETECTED   Listeria monocytogenes NOT DETECTED NOT DETECTED   Staphylococcus species NOT DETECTED NOT DETECTED   Staphylococcus aureus (BCID) NOT DETECTED NOT DETECTED   Staphylococcus epidermidis NOT DETECTED NOT DETECTED   Staphylococcus lugdunensis NOT DETECTED NOT DETECTED   Streptococcus species NOT DETECTED NOT DETECTED   Streptococcus agalactiae NOT DETECTED NOT DETECTED   Streptococcus pneumoniae NOT DETECTED NOT DETECTED   Streptococcus pyogenes NOT DETECTED NOT DETECTED   A.calcoaceticus-baumannii NOT DETECTED NOT DETECTED   Bacteroides fragilis DETECTED (A) NOT DETECTED   Enterobacterales NOT DETECTED NOT DETECTED   Enterobacter cloacae complex NOT DETECTED NOT DETECTED   Escherichia coli NOT DETECTED NOT DETECTED   Klebsiella aerogenes NOT DETECTED NOT DETECTED   Klebsiella oxytoca NOT DETECTED NOT DETECTED   Klebsiella pneumoniae NOT DETECTED NOT DETECTED   Proteus species NOT DETECTED NOT DETECTED   Salmonella species NOT DETECTED NOT DETECTED   Serratia marcescens NOT DETECTED NOT DETECTED   Haemophilus influenzae NOT DETECTED NOT DETECTED   Neisseria meningitidis NOT DETECTED NOT DETECTED    Pseudomonas aeruginosa NOT DETECTED NOT DETECTED   Stenotrophomonas maltophilia NOT DETECTED NOT DETECTED   Candida albicans NOT DETECTED NOT DETECTED   Candida auris NOT DETECTED NOT DETECTED   Candida glabrata NOT DETECTED NOT DETECTED   Candida krusei NOT DETECTED NOT DETECTED   Candida parapsilosis NOT DETECTED NOT DETECTED   Candida tropicalis NOT DETECTED NOT DETECTED   Cryptococcus neoformans/gattii NOT DETECTED NOT DETECTED    Tawnya Crook, PharmD, BCPS Clinical Pharmacist 01/02/2022 11:22 AM

## 2022-01-02 NOTE — Progress Notes (Signed)
Initial Nutrition Assessment  DOCUMENTATION CODES:   Not applicable  INTERVENTION:  - will monitor for planning concerning nutrition.    NUTRITION DIAGNOSIS:   Increased nutrient needs related to cancer and cancer related treatments, post-op healing as evidenced by estimated needs.  GOAL:   Patient will meet greater than or equal to 90% of their needs  MONITOR:   Diet advancement, Labs, Weight trends, Skin, I & O's  REASON FOR ASSESSMENT:   Malnutrition Screening Tool  ASSESSMENT:   88 year-old male with medical history of stage IIB gastric adenocarcinoma currently receiving chemo, Afib on eliquis, stage 3 CKD, HTN, prostate cancer, stroke, and HLD. He presented to the ED on 7/3 due to abdominal pain. In the ED, CT abdomen/pelvis showed intra-abdominal free air which was concerning for bowel perforation. On 7/3 he emergently underwent ex lap d/t perforation.  Patient is POD #2 ex lap and drainage of intra-abdominal abscess. Patient discussed in rounds this AM and one-on-one with RN. NGT in place (gastric per abdominal x-ray 7/4) with 50 ml output present at this time.   Patient seen by [place] on 6/29 and note from that date states that patient had gastrojejunostomy prior to that date and that on 6/29 he was receiving cycle 3 of FOLFOX. Note from that date reviewed; patient was experiencing taste alterations, no N/V/D, and was drinking Ensure at home, which he enjoyed.   Patient laying in bed and flow sheet documentation indicates he is a/o to person and place. Patient's daughter and 3 nieces arrived shortly before RD. Daughter shares that patient looks better today than yesterday.   Weight on 7/3 was 187 lb and weight on 6/1 was 193 lb. This indicates 6 lb weight loss (3% body weight) in the past 1 month; not significant for time frame.  Patient at high risk for malnutrition but, despite some mild depletions identified today, do not feel confident identifying  malnutrition at this time.   No plan concerning nutrition outlined in Surgeon's note this AM; will await plan as more is known and provide interventions as appropriate.    Labs reviewed; BUN: 46 mg/dl, creatinine: 2.96 mg/dl, Ca: 7.6 mg/dl, GFR: 19 ml/min.  Medications reviewed; sliding scale novolog, 40 mg IV protonix/day, 100 mg IV thiamine/day.   IVF; D5-LR @ 100 ml/hr (408 kcal/24 hrs).    NUTRITION - FOCUSED PHYSICAL EXAM:  Flowsheet Row Most Recent Value  Orbital Region No depletion  Upper Arm Region Mild depletion  Thoracic and Lumbar Region Unable to assess  Buccal Region Mild depletion  Temple Region Mild depletion  Clavicle Bone Region No depletion  Clavicle and Acromion Bone Region Mild depletion  Scapular Bone Region No depletion  Dorsal Hand No depletion  Patellar Region No depletion  Anterior Thigh Region No depletion  Posterior Calf Region Mild depletion  Edema (RD Assessment) None  Hair Reviewed  Eyes Reviewed  Mouth Reviewed  [very dry oral cavity with thick mucus]  Skin Reviewed  Nails Reviewed       Diet Order:   Diet Order             Diet NPO time specified Except for: Ice Chips  Diet effective now                   EDUCATION NEEDS:   Not appropriate for education at this time  Skin:  Skin Assessment: Skin Integrity Issues: Skin Integrity Issues:: Incisions Incisions: abdomen (7/3)  Last BM:  PTA/unknown  Height:  Ht Readings from Last 1 Encounters:  12/31/21 '5\' 11"'$  (1.803 m)    Weight:   Wt Readings from Last 1 Encounters:  12/31/21 84.7 kg     BMI:  Body mass index is 26.04 kg/m.  Estimated Nutritional Needs:  Kcal:  2300-2500 kcal Protein:  110-125 grams Fluid:  >/= 2.3 L/day     Jarome Matin, MS, RD, LDN, CNSC Registered Dietitian II Inpatient Clinical Nutrition RD pager # and on-call/weekend pager # available in [facility]

## 2022-01-02 NOTE — Progress Notes (Signed)
NAME:  Dillon Smith, MRN:  [id number], DOB:  10-29-1929, LOS: 2 ADMISSION DATE:  12/31/2021, CONSULTATION DATE:  12/31/21 REFERRING MD:  Marlou Starks - CCS , CHIEF COMPLAINT:  abdominal pain  History of Present Illness:  88 yo M with stage IIB gastric adenocarcinoma currently receiving chemo, Afib on eliquis, CKD III, presented to [facility] ED 7/3 with abdominal pain.  CT a/p in ED was significant for intraabdominal free air, concerning for bowel perforation. He was posted urgently for ex lap this afternoon with plan for ICU admission post op  PCCM consulted in this setting   Pertinent  Medical History   Stage IIB gastric adenocarcinoma  CAD HTN Biventricular heart failure  CVA CKD III Afib Prostate cancer   Significant Hospital Events: Including procedures, antibiotic start and stop dates in addition to other pertinent events   7/3 ED with abdominal pain. Ex lap for perf in setting fo gastric cancer, started on vanc, zosyn  Interim History / Subjective:   Bicarb infusion stopped. BUN/S Cr trending up, borderline oliguric. A little more confused today.  Objective   Blood pressure 114/66, pulse (!) 106, temperature 97.6 F (36.4 C), temperature source Oral, resp. rate (!) 35, height '5\' 11"'$  (1.803 m), weight 84.7 kg, SpO2 94 %. CVP:  [5 mmHg-7 mmHg] 5 mmHg      Intake/Output Summary (Last 24 hours) at 01/02/2022 0851 Last data filed at 01/02/2022 0645 Gross per 24 hour  Intake 3861.22 ml  Output 2322 ml  Net 1539.22 ml   Filed Weights   12/31/21 2015  Weight: 84.7 kg    Examination: General appearance: 88 y.o., male, a little uncomfortable, male, a little uncomfortable  Eyes: tracking HENT: NCAT; dry MM Lungs: CTAB, with normal respiratory effort CV: IRIR, no murmur  Abdomen: Soft, appropriately ttp; non-distended, BS hypoactive Extremities: No peripheral edema, warm Skin: Normal turgor and texture; no rash Neuro: Alert, inattentive but follows commands  Labs/imaging reviewed   Resolved Hospital Problem  list     Assessment & Plan:   Perforated Gastric Cancer-- POD 2 ex lap,  intraabdominal abscess drainage Gastric adenocarcinoma  ABLA EBL 450, small hepatic injury. 2 blakes placed in OR.  P -daily cbc -zosyn, follow cultures and narrow as able -would challenge with DVT ppx  Lactic acidosis May be multifactorial - mounting stress response to surgery, sepsis due to intraabdominal infection, possibly sign of acute blood loss but seems relatively HDS, nutritional component.  -AG is decreased, not having abdominal pain, no need to trend further  Acute encephalopathy -expect this to clear as related to anesthetic, have discontinued cefepime -correct other metabolic abnormalities as able   Chronic systolic and diastolic heart failure On bedside US LV appears significantly depressed compared to most recent prior echo. -check formal TTE  Afib on eliquis -holding eliquis post op -cardiac monitoring -optimize lytes  AKI on CKD III - borderline oliguric but UOP picking up. Concerned he's developed ATN. May be multifactorial with hypotension, sepsis, hypovolemia playing roles. Although he appears to have significantly depressed LV systolic function on bedside US, IVC is collapsible, doubt renal venous congestion/CRS contributory. -trend BMP, f/u response to gentle fluid resuscitation -follow I/O closely  Neutropenia  -zosyn as above    Best Practice (right click and "Reselect all SmartList Selections" daily)   Diet/type: NPO DVT prophylaxis: SCD GI prophylaxis: N/A Lines: Central line, arterial line  Foley:  N/A Code Status:  full code Last date of multidisciplinary goals of care discussion [recommend palliative care involvement - he now  wishes to remain full code which is consistent with prior documentation barring initial admission code status which was reversed for surgery. He seemed to understand that if he were to decompensate to point of needing intubation or CPR/shocks that  in setting of his perforated gastric cancer, multimorbidity, malnourishment, advanced age that it would be extraordinarily unlikely for him to recover.]  Critical care time: n/a

## 2022-01-02 NOTE — Progress Notes (Signed)
2 Days Post-Op   Subjective/Chief Complaint: No complaints   Objective: Vital signs in last 24 hours: Temp:  [97.2 F (36.2 C)-99.1 F (37.3 C)] 98.8 F (37.1 C) (07/05 0300) Pulse Rate:  [63-115] 106 (07/05 0400) Resp:  [11-40] 35 (07/05 0600) BP: (98-137)/(47-87) 114/66 (07/05 0600) SpO2:  [91 %-97 %] 94 % (07/05 0400) Arterial Line BP: (89-155)/(48-81) 122/60 (07/05 0600) Last BM Date : 12/30/21  Intake/Output from previous day: 07/04 0701 - 07/05 0700 In: 4031.2 [I.V.:3747.8; IV Piggyback:283.4] Out: 2322 [Urine:625; Emesis/NG output:1600; Drains:97] Intake/Output this shift: No intake/output data recorded.  General appearance: alert and cooperative Resp: clear to auscultation bilaterally Cardio: regular rate and rhythm GI: soft, appropriately tender. Drain output serous  Lab Results:  Recent Labs    01/01/22 2100 01/02/22 0300  WBC 14.1* 15.0*  HGB 10.7* 10.2*  HCT 31.4* 29.2*  PLT 175 146*   BMET Recent Labs    01/01/22 0250 01/02/22 0315  NA 138 139  K 4.8 4.3  CL 107 108  CO2 18* 21*  GLUCOSE 167* 131*  BUN 33* 46*  CREATININE 2.20* 2.96*  CALCIUM 8.1* 7.6*   PT/INR Recent Labs    12/31/21 2127 01/01/22 0250  LABPROT 17.4* 17.6*  INR 1.4* 1.5*   ABG Recent Labs    12/31/21 1711 01/01/22 0420  PHART 7.199* 7.42  HCO3 20.9 16.9*    Studies/Results: DG Abd 1 View  Result Date: 01/01/2022 CLINICAL DATA:  Feeding tube placement. EXAM: ABDOMEN - 1 VIEW COMPARISON:  Radiograph yesterday. FINDINGS: The enteric tube has been advanced, tip and side-port are below the diaphragm. Abdominal drains remain in place. IMPRESSION: Enteric tube with tip and side-port below the diaphragm in the stomach. Electronically Signed   By: Keith Rake M.D.   On: 01/01/2022 00:52   DG Abd 1 View  Result Date: 01/01/2022 CLINICAL DATA:  Feeding tube placement EXAM: ABDOMEN - 1 VIEW COMPARISON:  CT 12/31/2021 FINDINGS: Esophageal tube tip overlies the  stomach, side-port near the GE junction. Probable drains over the upper abdomen. Faintly visible gastrojejunal stent. Upper gas pattern is unobstructed. IMPRESSION: Esophageal tube tip overlies stomach, side-port in the region of GE junction, further advancement could be considered for more optimal positioning Electronically Signed   By: Donavan Foil M.D.   On: 01/01/2022 00:11   CT Angio Abd/Pel W and/or Wo Contrast  Result Date: 12/31/2021 CLINICAL DATA:  Mesenteric ischemia, abdominal pain EXAM: CTA ABDOMEN AND PELVIS WITHOUT AND WITH CONTRAST TECHNIQUE: Multidetector CT imaging of the abdomen and pelvis was performed using the standard protocol during bolus administration of intravenous contrast. Multiplanar reconstructed images and MIPs were obtained and reviewed to evaluate the vascular anatomy. RADIATION DOSE REDUCTION: This exam was performed according to the departmental dose-optimization program which includes automated exposure control, adjustment of the mA and/or kV according to patient size and/or use of iterative reconstruction technique. CONTRAST:  27m OMNIPAQUE IOHEXOL 350 MG/ML SOLN COMPARISON:  CT 12/22/2021 FINDINGS: VASCULAR Aorta: Moderate calcified atheromatous plaque throughout. No dissection or stenosis. Fusiform 3.7 cm infrarenal aneurysm tapering to normal caliber above the bifurcation nonocclusive mural thrombus in the aneurysmal segment. Celiac: Calcified ostial plaque resulting in short segment stenosis of at least mild hemodynamic significance. Branch vessels unremarkable. SMA: Calcified ostial plaque resulting in short segment stenosis of at least mild severity, mildly atheromatous but patent distally. Renals: Single left, with partially calcified ostial plaque resulting in short segment stenosis of at least mild severity, patent distally. Single right, mildly atheromatous  but patent. IMA: Patent, arising from the distal aspect of the aneurysmal segment. Inflow: Fusiform  dilatation of distal right common iliac artery up to 2 cm diameter, left 1.4 cm. Moderate scattered calcified atheromatous plaque throughout bilateral iliac arterial systems. Proximal Outflow: Atheromatous, patent Veins: Patent hepatic veins, portal vein, SMV, splenic vein, bilateral renal veins. No mesenteric or portal venous gas. Review of the MIP images confirms the above findings. NON-VASCULAR Lower chest: No pleural or pericardial effusion. Minimal interstitial infiltrate or dependent atelectasis posteriorly at the right lung base. Sternotomy wires. Central venous catheter extends to the proximal right atrium. Hepatobiliary: 1.4 cm hyperdense lesion in hepatic segment 5. Gallbladder nondistended. No calcified gallstones. No biliary ductal dilatation. Pancreas: Unremarkable. No pancreatic ductal dilatation or surrounding inflammatory changes. Spleen: Normal in size without focal abnormality. Adrenals/Urinary Tract: 3.2 cm enhancing left adrenal mass, stable. Bilateral renal cysts stable. No hydronephrosis. Urinary bladder physiologically distended. Stomach/Bowel: Stomach is decompressed. Patent gastroduodenal bypass device. Poorly marginated 3.6 cm enhancing antral mass. The duodenum is decompressed. Small bowel is nondilated. Normal appendix. The colon is incompletely distended, unremarkable. No pneumatosis. No mesenteric venous gas. Lymphatic: No abdominal or pelvic adenopathy. Reproductive: Metallic seeds throughout the prostate. Other: Small volume abdominal and pelvic ascites. Scattered free air in the anterior upper abdomen. Musculoskeletal: Spondylitic changes in the lower lumbar spine. IMPRESSION: 1. Positive for free intraperitoneal air, consistent with perforated viscus, site indeterminate. Critical Value/emergent results were called by telephone at the time of interpretation on 12/31/2021 at 1:57 pm to provider [facility] , who verbally acknowledged these results. 2. 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 3. 3.7 cm infrarenal abdominal aortic aneurysm. Consensus guidelines recommend follow-up ultrasound every 2 years. Reference: Joellyn Rued Radiol 2013; 10:789-794. Electronically Signed   By: Lucrezia Europe M.D.   On: 12/31/2021 14:03   DG Chest Portable 1 View  Result Date: 12/31/2021 CLINICAL DATA:  Provided history: Epigastric pain, rule out infection. EXAM: PORTABLE CHEST 1 VIEW COMPARISON:  Chest radiograph 09/12/2021 (images available, report unavailable). FINDINGS: Right chest infusion port catheter with tip projecting at the level of the superior cavoatrial junction. Prior median sternotomy/CABG. Heart size at the upper limits of normal. Heart size at the upper limits of normal. No appreciable airspace consolidation or pulmonary edema. No evidence of pleural effusion or pneumothorax. No acute bony abnormality identified. IMPRESSION: Right chest infusion port catheter with tip at the level of the superior cavoatrial junction. No evidence of acute cardiopulmonary abnormality. Aortic Atherosclerosis (ICD10-I70.0). Electronically Signed   By: Kellie Simmering D.O.   On: 12/31/2021 11:58    Anti-infectives: Anti-infectives (From admission, onward)    Start     Dose/Rate Route Frequency Ordered Stop   01/02/22 1400  piperacillin-tazobactam (ZOSYN) IVPB 2.25 g        2.25 g 100 mL/hr over 30 Minutes Intravenous Every 8 hours 01/02/22 0718     01/01/22 2000  vancomycin (VANCOCIN) IVPB 1000 mg/200 mL premix  Status:  Discontinued        1,000 mg 200 mL/hr over 60 Minutes Intravenous Every 24 hours 12/31/21 1820 01/01/22 0824   12/31/21 2000  piperacillin-tazobactam (ZOSYN) IVPB 3.375 g  Status:  Discontinued        3.375 g 12.5 mL/hr over 240 Minutes Intravenous Every 8 hours 12/31/21 1821 01/02/22 0718   12/31/21 1130  vancomycin (VANCOREADY) IVPB 1750 mg/350 mL        1,750 mg 175 mL/hr over 120  Minutes Intravenous  Once 12/31/21 1124 12/31/21 1411   12/31/21  1130  ceFEPIme (MAXIPIME) 2 g in sodium chloride 0.9 % 100 mL IVPB        2 g 200 mL/hr over 30 Minutes Intravenous  Once 12/31/21 1124 12/31/21 1226       Assessment/Plan: s/p Procedure(s) with comments: EXPLORATORY LAPAROTOMY (N/A) - DRAINAGE OF INTRAABDOMINAL ABSCESS Continue ng and bowel rest Continue IV abx Continue to monitor in icu Leave foley in. Cr increasing Gastric Cancer. On chemo  LOS: 2 days    Dillon Smith 01/02/2022

## 2022-01-03 ENCOUNTER — Inpatient Hospital Stay (HOSPITAL_COMMUNITY): Payer: Medicare Other

## 2022-01-03 DIAGNOSIS — K255 Chronic or unspecified gastric ulcer with perforation: Secondary | ICD-10-CM | POA: Diagnosis not present

## 2022-01-03 DIAGNOSIS — R0609 Other forms of dyspnea: Secondary | ICD-10-CM

## 2022-01-03 LAB — GLUCOSE, CAPILLARY
Glucose-Capillary: 130 mg/dL — ABNORMAL HIGH (ref 70–99)
Glucose-Capillary: 132 mg/dL — ABNORMAL HIGH (ref 70–99)
Glucose-Capillary: 134 mg/dL — ABNORMAL HIGH (ref 70–99)
Glucose-Capillary: 139 mg/dL — ABNORMAL HIGH (ref 70–99)
Glucose-Capillary: 144 mg/dL — ABNORMAL HIGH (ref 70–99)
Glucose-Capillary: 145 mg/dL — ABNORMAL HIGH (ref 70–99)

## 2022-01-03 LAB — ECHOCARDIOGRAM COMPLETE
AR max vel: 2.69 cm2
AV Area VTI: 2.74 cm2
AV Area mean vel: 2.79 cm2
AV Mean grad: 3.5 mmHg
AV Peak grad: 6.3 mmHg
Ao pk vel: 1.25 m/s
Area-P 1/2: 4.06 cm2
Calc EF: 28.8 %
Height: 71 in
MV M vel: 5.3 m/s
MV Peak grad: 112.4 mmHg
P 1/2 time: 648 msec
Radius: 0.3 cm
S' Lateral: 4.9 cm
Single Plane A2C EF: 8 %
Single Plane A4C EF: 47.5 %
Weight: 2987.67 oz

## 2022-01-03 LAB — CBC
HCT: 28.5 % — ABNORMAL LOW (ref 39.0–52.0)
Hemoglobin: 9.9 g/dL — ABNORMAL LOW (ref 13.0–17.0)
MCH: 30.9 pg (ref 26.0–34.0)
MCHC: 34.7 g/dL (ref 30.0–36.0)
MCV: 89.1 fL (ref 80.0–100.0)
Platelets: 113 10*3/uL — ABNORMAL LOW (ref 150–400)
RBC: 3.2 MIL/uL — ABNORMAL LOW (ref 4.22–5.81)
RDW: 13.9 % (ref 11.5–15.5)
WBC: 14.4 10*3/uL — ABNORMAL HIGH (ref 4.0–10.5)
nRBC: 0 % (ref 0.0–0.2)

## 2022-01-03 LAB — BASIC METABOLIC PANEL
Anion gap: 9 (ref 5–15)
BUN: 44 mg/dL — ABNORMAL HIGH (ref 8–23)
CO2: 23 mmol/L (ref 22–32)
Calcium: 8 mg/dL — ABNORMAL LOW (ref 8.9–10.3)
Chloride: 110 mmol/L (ref 98–111)
Creatinine, Ser: 2.52 mg/dL — ABNORMAL HIGH (ref 0.61–1.24)
GFR, Estimated: 24 mL/min — ABNORMAL LOW (ref >60)
Glucose, Bld: 145 mg/dL — ABNORMAL HIGH (ref 70–99)
Potassium: 3.7 mmol/L (ref 3.5–5.1)
Sodium: 142 mmol/L (ref 135–145)

## 2022-01-03 MED ORDER — FENTANYL CITRATE PF 50 MCG/ML IJ SOSY
25.0000 ug | PREFILLED_SYRINGE | INTRAMUSCULAR | Status: DC | PRN
  Administered 2022-01-04 (×2): 50 ug via INTRAVENOUS
  Administered 2022-01-04 – 2022-01-05 (×4): 75 ug via INTRAVENOUS
  Administered 2022-01-05: 50 ug via INTRAVENOUS
  Administered 2022-01-05: 75 ug via INTRAVENOUS
  Administered 2022-01-05 (×2): 50 ug via INTRAVENOUS
  Administered 2022-01-05: 75 ug via INTRAVENOUS
  Filled 2022-01-03 (×3): qty 2
  Filled 2022-01-03 (×3): qty 1
  Filled 2022-01-03 (×3): qty 2
  Filled 2022-01-03 (×2): qty 1

## 2022-01-03 MED ORDER — FENTANYL CITRATE PF 50 MCG/ML IJ SOSY
25.0000 ug | PREFILLED_SYRINGE | INTRAMUSCULAR | Status: DC | PRN
  Administered 2022-01-03 (×4): 50 ug via INTRAVENOUS
  Filled 2022-01-03 (×4): qty 1

## 2022-01-03 NOTE — Progress Notes (Signed)
eLink Physician-Brief Progress Note Patient Name: Dillon Smith DOB: [birth year] MRN: [id number]   Date of Service  01/03/2022  HPI/Events of Note  Pain - Not relieved by current Fentanyl IV dose.   eICU Interventions  Plan: Increase Fentanyl IV dose to 25-50 mcg Q 1 hour PRN pain.      Intervention Category Major Interventions: Other:  Tenleigh Byer Cornelia Copa 01/03/2022, 6:56 AM

## 2022-01-03 NOTE — Progress Notes (Signed)
Patient has increasing abdominal pain and would like a stronger pain medication for his management. Called e-link to request reassessment of current pain medication.

## 2022-01-03 NOTE — Progress Notes (Signed)
Pharmacy Antibiotic Note  Dillon Smith is a 88 y.o. male admitted on 12/31/2021 with intra-abdominal infection, perforated gastric cancer. He is post op drainage of intra-abdominal abscess with 2 drains in place. He was initially started on vancomycin and Zosyn, which was de-escalated to Zosyn with concern for worsening AKI and Zosyn to adequately cover likely intra-abdominal organisms. Blood cultures positive 1/4 bottles bacteroides fragilis. Pharmacy has been consulted for Zosyn dosing.  SCr appears to have peaked. CrCl borderline at 20.8 ml/min today.  Plan: -Will continue Zosyn 2.25 g IV q8h today. If renal function continues to improve, anticipate changing to Zosyn 3.375 g IV q8h extended infusion in the morning. -Pharmacy to continue to follow renal function, cultures and clinical progress for dose adjustments, de-escalation and length of therapy as indicated  Height: '5\' 11"'$  (180.3 cm) Weight: 84.7 kg (186 lb 11.7 oz) IBW/kg (Calculated) : 75.3  Temp (24hrs), Avg:97.9 F (36.6 C), Min:97.1 F (36.2 C), Max:98.6 F (37 C)  Recent Labs  Lab 12/31/21 0941 12/31/21 1141 12/31/21 1428 12/31/21 2127 01/01/22 0250 01/01/22 0854 01/01/22 1505 01/01/22 2100 01/02/22 0300 01/02/22 0315 01/03/22 0322  WBC 1.2*  --   --  2.4* 4.7 8.0 11.0* 14.1* 15.0*  --  14.4*  CREATININE 1.45*  --   --  1.66* 2.20*  --   --   --   --  2.96* 2.52*  LATICACIDVEN 4.1* 4.3* 2.9* 4.1* 5.5* 4.6*  --   --   --   --   --      Estimated Creatinine Clearance: 20.8 mL/min (A) (by C-G formula based on SCr of 2.52 mg/dL (H)).    Allergies  Allergen Reactions   Penicillins Itching    Antimicrobials this admission: 7/3 Cefepime x 1 7/3 Vanc >> 7/4 7/3 Zosyn >>  Microbiology results: 7/3 BCx: 1/4 bacteroides fragilis  Thank you for allowing pharmacy to be a part of this patient's care.  Tawnya Crook, PharmD, BCPS Clinical Pharmacist 01/03/2022 12:57 PM

## 2022-01-03 NOTE — Progress Notes (Signed)
Per patient to add his daughter, [name], to his POC list and have her as her primary point of contact. Contact information updated in patient's chart.

## 2022-01-03 NOTE — Progress Notes (Signed)
   NAME:  Dillon Smith, MRN:  [id number], DOB:  04-12-1930, LOS: 3 ADMISSION DATE:  12/31/2021, CONSULTATION DATE:  12/31/21 REFERRING MD:  Marlou Starks - CCS , CHIEF COMPLAINT:  abdominal pain  History of Present Illness:  88 yo M with stage IIB gastric adenocarcinoma currently receiving chemo, Afib on eliquis, CKD III, presented to [facility] ED 7/3 with abdominal pain.  CT a/p in ED was significant for intraabdominal free air, concerning for bowel perforation. He was posted urgently for ex lap this afternoon with plan for ICU admission post op  PCCM consulted in this setting   Pertinent  Medical History   Stage IIB gastric adenocarcinoma  CAD HTN Biventricular heart failure  CVA CKD III Afib Prostate cancer   Significant Hospital Events: Including procedures, antibiotic start and stop dates in addition to other pertinent events   7/3 ED with abdominal pain. Ex lap for perf in setting fo gastric cancer, started on vanc, zosyn  Interim History / Subjective:  Stable. Has mild abd pain but states controlled with Fentanyl received earlier this AM. Daughter at bedside.  Objective   Blood pressure 118/64, pulse 82, temperature 97.9 F (36.6 C), temperature source Axillary, resp. rate 14, height '5\' 11"'$  (1.803 m), weight 84.7 kg, SpO2 97 %. CVP:  [8 mmHg-10 mmHg] 9 mmHg      Intake/Output Summary (Last 24 hours) at 01/03/2022 0757 Last data filed at 01/03/2022 0405 Gross per 24 hour  Intake 1709.42 ml  Output 2120 ml  Net -410.58 ml   Filed Weights   12/31/21 2015  Weight: 84.7 kg    Examination:  General appearance: 88 y.o., male, in NAD HEENT: NCAT; dry MM, EOMI Lungs: CTAB, with normal respiratory effort CV: IRIR, no murmur  Abdomen: Soft, appropriately ttp; non-distended, BS hypoactive Extremities: No deformities, no peripheral edema. Skin: Warm, Normal turgor and texture; no rash Neuro: Alert, inattentive but is able to answer questions and follow commands  Labs/imaging  reviewed  Assessment & Plan:   Perforated Gastric Cancer-- s/p ex lap with intraabdominal abscess drainage 7/3 Gastric adenocarcinoma  ABLA EBL 450, small hepatic injury. 2 blakes placed in OR.  P -surgery following -daily cbc, hgb stable currently -continue zosyn, follow cultures and narrow as able -would challenge with DVT ppx  Acute encephalopathy - resolving. Currently awake and alert though inattentive (but does answer questions and follow commands) -expect this to clear as related to anesthetic, have discontinued cefepime -correct other metabolic abnormalities as able   Chronic systolic and diastolic heart failure On bedside US LV appears significantly depressed compared to most recent prior echo. -f/u on formal echo  Afib on eliquis -holding eliquis post op -cardiac monitoring -optimize lytes  AKI on CKD III - borderline oliguric but UOP picking up. Concerned he's developed ATN. May be multifactorial with hypotension, sepsis, hypovolemia playing roles. Although he appears to have significantly depressed LV systolic function on bedside US, IVC is collapsible, doubt renal venous congestion/CRS contributory. -trend BMP, mild improvement -follow I/O closely   Continue rest per surgery. Nothing further to add at this point. PCCM will sign off. Please call back if we can be of further assistance.    Montey Hora, [facility] For pager details, please see AMION or use Epic chat  After 1900, please call [facility] for cross coverage needs 01/03/2022, 7:57 AM

## 2022-01-03 NOTE — Progress Notes (Signed)
Good waveform during and after waste drawn and returned in A-Line

## 2022-01-03 NOTE — Progress Notes (Signed)
Echocardiogram 2D Echocardiogram has been performed.  Joette Catching 01/03/2022, 12:37 PM

## 2022-01-03 NOTE — Progress Notes (Signed)
The patient continuously moans and calls out despite receiving PRN pain medication. This nurse paged and spoke with Dr. Rush Farmer (on call surgeon for [facility]) and Dr. Rush Farmer gave this nurse a verbal order to increase the patient's Fentanyl to 75 mcg every hour. This nurse will place this order as Dr. Rush Farmer stated he was in the car driving.

## 2022-01-03 NOTE — Progress Notes (Signed)
3 Days Post-Op   Subjective/Chief Complaint: No complaints   Objective: Vital signs in last 24 hours: Temp:  [97.1 F (36.2 C)-98.6 F (37 C)] 97.9 F (36.6 C) (07/06 0421) Pulse Rate:  [49-136] 82 (07/06 0500) Resp:  [11-30] 14 (07/06 0500) BP: (101-137)/(55-87) 118/64 (07/06 0500) SpO2:  [95 %-100 %] 97 % (07/06 0500) Arterial Line BP: (89-138)/(52-95) 89/57 (07/06 0500) Last BM Date : 12/30/21  Intake/Output from previous day: 07/05 0701 - 07/06 0700 In: 1709.4 [I.V.:1653.9; IV Piggyback:55.6] Out: 2120 [Urine:1350; Emesis/NG output:700; Drains:70] Intake/Output this shift: No intake/output data recorded.  General appearance: alert and cooperative Resp: clear to auscultation bilaterally Cardio: regular rate and rhythm GI: soft, mild tenderness. Drain output serous  Lab Results:  Recent Labs    01/02/22 0300 01/03/22 0322  WBC 15.0* 14.4*  HGB 10.2* 9.9*  HCT 29.2* 28.5*  PLT 146* 113*   BMET Recent Labs    01/02/22 0315 01/03/22 0322  NA 139 142  K 4.3 3.7  CL 108 110  CO2 21* 23  GLUCOSE 131* 145*  BUN 46* 44*  CREATININE 2.96* 2.52*  CALCIUM 7.6* 8.0*   PT/INR Recent Labs    12/31/21 2127 01/01/22 0250  LABPROT 17.4* 17.6*  INR 1.4* 1.5*   ABG Recent Labs    12/31/21 1711 01/01/22 0420  PHART 7.199* 7.42  HCO3 20.9 16.9*    Studies/Results: No results found.  Anti-infectives: Anti-infectives (From admission, onward)    Start     Dose/Rate Route Frequency Ordered Stop   01/02/22 1400  piperacillin-tazobactam (ZOSYN) IVPB 2.25 g        2.25 g 100 mL/hr over 30 Minutes Intravenous Every 8 hours 01/02/22 0718     01/01/22 2000  vancomycin (VANCOCIN) IVPB 1000 mg/200 mL premix  Status:  Discontinued        1,000 mg 200 mL/hr over 60 Minutes Intravenous Every 24 hours 12/31/21 1820 01/01/22 0824   12/31/21 2000  piperacillin-tazobactam (ZOSYN) IVPB 3.375 g  Status:  Discontinued        3.375 g 12.5 mL/hr over 240 Minutes  Intravenous Every 8 hours 12/31/21 1821 01/02/22 0718   12/31/21 1130  vancomycin (VANCOREADY) IVPB 1750 mg/350 mL        1,750 mg 175 mL/hr over 120 Minutes Intravenous  Once 12/31/21 1124 12/31/21 1411   12/31/21 1130  ceFEPIme (MAXIPIME) 2 g in sodium chloride 0.9 % 100 mL IVPB        2 g 200 mL/hr over 30 Minutes Intravenous  Once 12/31/21 1124 12/31/21 1226       Assessment/Plan: s/p Procedure(s) with comments: EXPLORATORY LAPAROTOMY (N/A) - DRAINAGE OF INTRAABDOMINAL ABSCESS Continue ng and bowel rest May need to consider tpn for nutrition support Continue vanc cefepime AKI showing some improvement today with Cr starting to decrease. Leave foley in to monitor output closely Gastric cancer. Recent chemo  LOS: 3 days    Dillon Smith 01/03/2022

## 2022-01-04 ENCOUNTER — Inpatient Hospital Stay (HOSPITAL_COMMUNITY): Payer: Medicare Other

## 2022-01-04 ENCOUNTER — Inpatient Hospital Stay: Payer: Self-pay

## 2022-01-04 ENCOUNTER — Other Ambulatory Visit: Payer: Self-pay

## 2022-01-04 LAB — BASIC METABOLIC PANEL
Anion gap: 6 (ref 5–15)
BUN: 44 mg/dL — ABNORMAL HIGH (ref 8–23)
CO2: 25 mmol/L (ref 22–32)
Calcium: 8.3 mg/dL — ABNORMAL LOW (ref 8.9–10.3)
Chloride: 113 mmol/L — ABNORMAL HIGH (ref 98–111)
Creatinine, Ser: 2.31 mg/dL — ABNORMAL HIGH (ref 0.61–1.24)
GFR, Estimated: 26 mL/min — ABNORMAL LOW (ref >60)
Glucose, Bld: 130 mg/dL — ABNORMAL HIGH (ref 70–99)
Potassium: 3.7 mmol/L (ref 3.5–5.1)
Sodium: 144 mmol/L (ref 135–145)

## 2022-01-04 LAB — CBC
HCT: 27.8 % — ABNORMAL LOW (ref 39.0–52.0)
Hemoglobin: 9.5 g/dL — ABNORMAL LOW (ref 13.0–17.0)
MCH: 30.5 pg (ref 26.0–34.0)
MCHC: 34.2 g/dL (ref 30.0–36.0)
MCV: 89.4 fL (ref 80.0–100.0)
Platelets: 86 10*3/uL — ABNORMAL LOW (ref 150–400)
RBC: 3.11 MIL/uL — ABNORMAL LOW (ref 4.22–5.81)
RDW: 14.3 % (ref 11.5–15.5)
WBC: 8.9 10*3/uL (ref 4.0–10.5)
nRBC: 0 % (ref 0.0–0.2)

## 2022-01-04 LAB — GLUCOSE, CAPILLARY
Glucose-Capillary: 128 mg/dL — ABNORMAL HIGH (ref 70–99)
Glucose-Capillary: 123 mg/dL — ABNORMAL HIGH (ref 70–99)
Glucose-Capillary: 127 mg/dL — ABNORMAL HIGH (ref 70–99)
Glucose-Capillary: 123 mg/dL — ABNORMAL HIGH (ref 70–99)
Glucose-Capillary: 141 mg/dL — ABNORMAL HIGH (ref 70–99)
Glucose-Capillary: 148 mg/dL — ABNORMAL HIGH (ref 70–99)

## 2022-01-04 LAB — CULTURE, BLOOD (ROUTINE X 2): Special Requests: ADEQUATE

## 2022-01-04 LAB — MAGNESIUM: Magnesium: 2.6 mg/dL — ABNORMAL HIGH (ref 1.7–2.4)

## 2022-01-04 LAB — PHOSPHORUS: Phosphorus: 3.3 mg/dL (ref 2.5–4.6)

## 2022-01-04 MED ORDER — TRAVASOL 10 % IV SOLN
INTRAVENOUS | Status: AC
  Filled 2022-01-04: qty 528

## 2022-01-04 MED ORDER — DEXTROSE IN LACTATED RINGERS 5 % IV SOLN: INTRAVENOUS | Status: DC

## 2022-01-04 MED ORDER — PIPERACILLIN-TAZOBACTAM 3.375 G IVPB
3.3750 g | Freq: Three times a day (TID) | INTRAVENOUS | Status: DC
  Administered 2022-01-04 – 2022-01-08 (×12): 3.375 g via INTRAVENOUS
  Filled 2022-01-04 (×12): qty 50

## 2022-01-04 MED ORDER — SODIUM CHLORIDE 0.9% FLUSH
10.0000 mL | Freq: Two times a day (BID) | INTRAVENOUS | Status: DC
  Administered 2022-01-04: 20 mL
  Administered 2022-01-04 – 2022-01-05 (×2): 10 mL
  Administered 2022-01-05: 40 mL
  Administered 2022-01-06 (×2): 10 mL
  Administered 2022-01-07: 20 mL
  Administered 2022-01-09 – 2022-01-22 (×13): 10 mL

## 2022-01-04 MED ORDER — SODIUM CHLORIDE 0.9% FLUSH
10.0000 mL | INTRAVENOUS | Status: DC | PRN
  Administered 2022-01-09 – 2022-01-22 (×4): 10 mL

## 2022-01-04 NOTE — Progress Notes (Signed)
Patient removed NG tube. On call provider notified. Dr. Catalina Antigua states that he is the provider that is covering tomorrow and does not wish to replace NG at this time.

## 2022-01-04 NOTE — Progress Notes (Signed)
Nutrition Follow-up  DOCUMENTATION CODES:   Not applicable  INTERVENTION:  - TPN initiation and advancement per Pharmacist.  - monitor magnesium, potassium, and phosphorus BID for at least 3 days, MD to replete as needed, as pt is at risk for refeeding syndrome given high risk for malnutrition, no nutrition for at least 5 days.    NUTRITION DIAGNOSIS:   Increased nutrient needs related to cancer and cancer related treatments, post-op healing as evidenced by estimated needs. -ongoing  GOAL:   Patient will meet greater than or equal to 90% of their needs -unable to meet at this time.  MONITOR:   Labs, Weight trends, Skin, I & O's, Other (Comment) (TPN regimen)  REASON FOR ASSESSMENT:   Consult New TPN/TNA  ASSESSMENT:   88 year-old male with medical history of stage IIB gastric adenocarcinoma currently receiving chemo, Afib on eliquis, stage 3 CKD, HTN, prostate cancer, stroke, and HLD. He presented to the ED on 7/3 due to abdominal pain. In the ED, CT abdomen/pelvis showed intra-abdominal free air which was concerning for bowel perforation. On 7/3 he emergently underwent ex lap d/t perforation.  Able to talk with RN before and after visit to patient's room. Patient sitting up with bed in chair position. NGT remains in place with <50 ml output noted in canister. Patient denies abdominal pain, pressure, cramping, or nausea.   No visitors present at the time of RD visit today.   Patient to remain NPO. RN shares possible NGT removal on 7/8.  He has implanted port in R chest from 11/28/21 and double lumen L IJ placed 7/3.  Order currently in place for custom TPN to start at 40 ml/hr this evening and goal rate of 90 ml/hr which will provide 2298 kcal and 119 grams protein.   He has not been weighed since admission on 7/3. Non-pitting edema to all extremities and to face documented in the edema section of flow sheet yesterday at 2000.    Labs reviewed; CBGs: 128 and 123 mg/dl,  Cl: 113 mmol/l, BUN: 44 mg/dl, creatinine: 2.31 mg/dl, Ca: 8.3 mg/dl, Mg: 2.6 mg/dl, GFR: 26 ml/min.  Medications reviewed; sliding scale novolog, 40 mg IV protonix/day, 100 mg IV thiamine/day started 7/5.  IVF; D5-LR @ 100 ml/hr to decrease to 60 ml/hr today at 1800 (will provide 245 kcal/24 hrs).    Diet Order:   Diet Order             Diet NPO time specified Except for: Ice Chips  Diet effective now                   EDUCATION NEEDS:   No education needs have been identified at this time  Skin:  Skin Assessment: Skin Integrity Issues: Skin Integrity Issues:: Incisions Incisions: abdomen (7/3)  Last BM:  PTA/unknown  Height:   Ht Readings from Last 1 Encounters:  12/31/21 '5\' 11"'$  (1.803 m)    Weight:   Wt Readings from Last 1 Encounters:  12/31/21 84.7 kg     BMI:  Body mass index is 26.04 kg/m.  Estimated Nutritional Needs:  Kcal:  2300-2500 kcal Protein:  110-125 grams Fluid:  >/= 2.3 L/day     Jarome Matin, MS, RD, LDN, CNSC Registered Dietitian II Inpatient Clinical Nutrition RD pager # and on-call/weekend pager # available in [facility]

## 2022-01-04 NOTE — Progress Notes (Addendum)
4 Days Post-Op   Subjective/Chief Complaint: No new complaints - endorses abd pain that is controlled with IV pain medication. Denies emesis. NG in place - 850 cc/24h thick bile. Denies flatus or BM.    Objective: Vital signs in last 24 hours: Temp:  [97.7 F (36.5 C)-99.4 F (37.4 C)] 98 F (36.7 C) (07/07 0800) Pulse Rate:  [38-98] 78 (07/07 0500) Resp:  [0-31] 30 (07/07 0500) BP: (119-155)/(50-97) 147/97 (07/07 0500) SpO2:  [92 %-98 %] 96 % (07/07 0500) Arterial Line BP: (113-121)/(55-61) 121/61 (07/06 1200) Last BM Date : 12/30/21  Intake/Output from previous day: 07/06 0701 - 07/07 0700 In: 2669.2 [I.V.:2501; IV Piggyback:148.2] Out: 2395 [Urine:1450; Emesis/NG output:850; Drains:95] Intake/Output this shift: No intake/output data recorded.  General appearance: alert and cooperative Resp: clear to auscultation bilaterally Cardio: regular rate and rhythm GI: soft, mild tenderness. Drain output serous  wound appears clean fascia in tact    Lab Results:  Recent Labs    01/03/22 0322 01/04/22 0300  WBC 14.4* 8.9  HGB 9.9* 9.5*  HCT 28.5* 27.8*  PLT 113* 86*   BMET Recent Labs    01/03/22 0322 01/04/22 0300  NA 142 144  K 3.7 3.7  CL 110 113*  CO2 23 25  GLUCOSE 145* 130*  BUN 44* 44*  CREATININE 2.52* 2.31*  CALCIUM 8.0* 8.3*   PT/INR No results for input(s): "LABPROT", "INR" in the last 72 hours.  ABG No results for input(s): "PHART", "HCO3" in the last 72 hours.  Invalid input(s): "PCO2", "PO2"   Studies/Results: ECHOCARDIOGRAM COMPLETE  Result Date: 01/03/2022    ECHOCARDIOGRAM REPORT   Patient Name:   Dillon Smith Date of Exam: 01/03/2022 Medical Rec #:  [id number]        Height:       71.0 in Accession #:    [id number]       Weight:       186.7 lb Date of Birth:  11/29/1929        BSA:          2.048 m Patient Age:    88 years         BP:           133/74 mmHg Patient Gender: M                HR:           97 bpm. Exam Location:  Inpatient  Procedure: 2D Echo, Cardiac Doppler, Color Doppler and Strain Analysis Indications:    dyspnea  History:        Patient has prior history of Echocardiogram examinations, most                 recent 08/16/2019. CAD, Arrythmias:Atrial Fibrillation; Risk                 Factors:Hypertension.  Sonographer:    Joette Catching RCS Referring Phys: [id number] Hortencia Conradi MEIER  Sonographer Comments: Global longitudinal strain was attempted. IMPRESSIONS  1. Left ventricular ejection fraction, by estimation, is 25 to 30%. The left ventricle has severely decreased function. The left ventricle demonstrates global hypokinesis. Left ventricular diastolic parameters are consistent with Grade II diastolic dysfunction (pseudonormalization). The average left ventricular global longitudinal strain is -8.0 %. The global longitudinal strain is abnormal.  2. Right ventricular systolic function is mildly reduced. The right ventricular size is moderately enlarged. There is mildly elevated pulmonary artery systolic pressure. The estimated right ventricular systolic pressure is 40.5  mmHg.  3. Left atrial size was severely dilated.  4. Right atrial size was moderately dilated.  5. The mitral valve is normal in structure. Moderate mitral valve regurgitation. No evidence of mitral stenosis.  6. Tricuspid valve regurgitation is moderate.  7. The aortic valve is tricuspid. There is mild calcification of the aortic valve. There is mild thickening of the aortic valve. Aortic valve regurgitation is severe. Aortic valve sclerosis is present, with no evidence of aortic valve stenosis.  8. The inferior vena cava is normal in size with greater than 50% respiratory variability, suggesting right atrial pressure of 3 mmHg. Comparison(s): Prior images reviewed side by side. FINDINGS  Left Ventricle: Left ventricular ejection fraction, by estimation, is 25 to 30%. The left ventricle has severely decreased function. The left ventricle demonstrates global  hypokinesis. The average left ventricular global longitudinal strain is -8.0 %. The global longitudinal strain is abnormal. The left ventricular internal cavity size was normal in size. There is no left ventricular hypertrophy. Left ventricular diastolic parameters are consistent with Grade II diastolic dysfunction (pseudonormalization).  LV Wall Scoring: The mid anteroseptal segment and mid inferoseptal segment are akinetic. Right Ventricle: The right ventricular size is moderately enlarged. No increase in right ventricular wall thickness. Right ventricular systolic function is mildly reduced. There is mildly elevated pulmonary artery systolic pressure. The tricuspid regurgitant velocity is 3.06 m/s, and with an assumed right atrial pressure of 3 mmHg, the estimated right ventricular systolic pressure is 48.2 mmHg. Left Atrium: Left atrial size was severely dilated. Right Atrium: Right atrial size was moderately dilated. Pericardium: There is no evidence of pericardial effusion. Mitral Valve: The mitral valve is normal in structure. Moderate mitral valve regurgitation. No evidence of mitral valve stenosis. Tricuspid Valve: The tricuspid valve is normal in structure. Tricuspid valve regurgitation is moderate . No evidence of tricuspid stenosis. Aortic Valve: The aortic valve is tricuspid. There is mild calcification of the aortic valve. There is mild thickening of the aortic valve. Aortic valve regurgitation is severe. Aortic regurgitation PHT measures 648 msec. Aortic valve sclerosis is present, with no evidence of aortic valve stenosis. Aortic valve mean gradient measures 3.5 mmHg. Aortic valve peak gradient measures 6.3 mmHg. Aortic valve area, by VTI measures 2.74 cm. Pulmonic Valve: The pulmonic valve was normal in structure. Pulmonic valve regurgitation is trivial. No evidence of pulmonic stenosis. Aorta: The aortic root is normal in size and structure. Venous: The inferior vena cava is normal in size with  greater than 50% respiratory variability, suggesting right atrial pressure of 3 mmHg. IAS/Shunts: No atrial level shunt detected by color flow Doppler.  LEFT VENTRICLE PLAX 2D LVIDd:         5.90 cm      Diastology LVIDs:         4.90 cm      LV e' medial:    7.72 cm/s LV PW:         1.00 cm      LV E/e' medial:  11.5 LV IVS:        1.00 cm      LV e' lateral:   12.90 cm/s LVOT diam:     2.20 cm      LV E/e' lateral: 6.9 LV SV:         50 LV SV Index:   25           2D Longitudinal Strain LVOT Area:     3.80 cm     2D Strain  GLS Avg:     -8.0 %  LV Volumes (MOD) LV vol d, MOD A2C: 138.0 ml LV vol d, MOD A4C: 181.0 ml LV vol s, MOD A2C: 127.0 ml LV vol s, MOD A4C: 95.1 ml LV SV MOD A2C:     11.0 ml LV SV MOD A4C:     181.0 ml LV SV MOD BP:      46.3 ml RIGHT VENTRICLE RV Basal diam:  5.45 cm RV Mid diam:    3.70 cm RV S prime:     14.80 cm/s TAPSE (M-mode): 1.4 cm LEFT ATRIUM              Index        RIGHT ATRIUM           Index LA diam:        5.10 cm  2.49 cm/m   RA Area:     23.70 cm LA Vol (A2C):   156.0 ml 76.17 ml/m  RA Volume:   84.20 ml  41.11 ml/m LA Vol (A4C):   114.0 ml 55.67 ml/m LA Biplane Vol: 141.0 ml 68.85 ml/m  AORTIC VALVE                    PULMONIC VALVE AV Area (Vmax):    2.69 cm     PV Vmax:          0.80 m/s AV Area (Vmean):   2.79 cm     PV Peak grad:     2.5 mmHg AV Area (VTI):     2.74 cm     PR End Diast Vel: 10.98 msec AV Vmax:           125.10 cm/s AV Vmean:          79.200 cm/s AV VTI:            0.183 m AV Peak Grad:      6.3 mmHg AV Mean Grad:      3.5 mmHg LVOT Vmax:         88.55 cm/s LVOT Vmean:        58.050 cm/s LVOT VTI:          0.132 m LVOT/AV VTI ratio: 0.72 AI PHT:            648 msec  AORTA Ao Root diam: 3.90 cm Ao Asc diam:  3.90 cm MITRAL VALVE                  TRICUSPID VALVE MV Area (PHT): 4.06 cm       TR Peak grad:   37.5 mmHg MV Decel Time: 187 msec       TR Vmax:        306.00 cm/s MR Peak grad:    112.4 mmHg MR Mean grad:    68.0 mmHg    SHUNTS MR Vmax:          530.00 cm/s  Systemic VTI:  0.13 m MR Vmean:        388.0 cm/s   Systemic Diam: 2.20 cm MR PISA:         0.57 cm MR PISA Eff ROA: 4 mm MR PISA Radius:  0.30 cm MV E velocity: 88.70 cm/s MV A velocity: 29.10 cm/s MV E/A ratio:  3.05 Candee Furbish MD Electronically signed by Candee Furbish MD Signature Date/Time: 01/03/2022/1:04:41 PM    Final     Anti-infectives: Anti-infectives (From admission, onward)    Start  Dose/Rate Route Frequency Ordered Stop   01/04/22 1200  piperacillin-tazobactam (ZOSYN) IVPB 3.375 g        3.375 g 12.5 mL/hr over 240 Minutes Intravenous Every 8 hours 01/04/22 0729     01/02/22 1400  piperacillin-tazobactam (ZOSYN) IVPB 2.25 g  Status:  Discontinued        2.25 g 100 mL/hr over 30 Minutes Intravenous Every 8 hours 01/02/22 0718 01/04/22 0728   01/01/22 2000  vancomycin (VANCOCIN) IVPB 1000 mg/200 mL premix  Status:  Discontinued        1,000 mg 200 mL/hr over 60 Minutes Intravenous Every 24 hours 12/31/21 1820 01/01/22 0824   12/31/21 2000  piperacillin-tazobactam (ZOSYN) IVPB 3.375 g  Status:  Discontinued        3.375 g 12.5 mL/hr over 240 Minutes Intravenous Every 8 hours 12/31/21 1821 01/02/22 0718   12/31/21 1130  vancomycin (VANCOREADY) IVPB 1750 mg/350 mL        1,750 mg 175 mL/hr over 120 Minutes Intravenous  Once 12/31/21 1124 12/31/21 1411   12/31/21 1130  ceFEPIme (MAXIPIME) 2 g in sodium chloride 0.9 % 100 mL IVPB        2 g 200 mL/hr over 30 Minutes Intravenous  Once 12/31/21 1124 12/31/21 1226       Assessment/Plan: s/p Procedure(s) with comments: EXPLORATORY LAPAROTOMY (N/A) - DRAINAGE OF INTRAABDOMINAL ABSCESS Continue ng and bowel rest Start TPN today. Continue zosyn AKI improving with Cr starting to decrease and UOP increasing. I think we would D/C foley and place external catheter today - will confirm with MD before ordering. Gastric cancer. Recent chemo  LOS: 4 days    Jill Alexanders 01/04/2022

## 2022-01-04 NOTE — Progress Notes (Signed)
Peripherally Inserted Central Catheter Placement  The IV Nurse has discussed with the patient and/or persons authorized to consent for the patient, the purpose of this procedure and the potential benefits and risks involved with this procedure.  The benefits include less needle sticks, lab draws from the catheter, and the patient may be discharged home with the catheter. Risks include, but not limited to, infection, bleeding, blood clot (thrombus formation), and puncture of an artery; nerve damage and irregular heartbeat and possibility to perform a PICC exchange if needed/ordered by physician.  Alternatives to this procedure were also discussed.  Bard Power PICC patient education guide, fact sheet on infection prevention and patient information card has been provided to patient /or left at bedside.    PICC Placement Documentation  PICC Triple Lumen 01/04/22 Left Brachial 50 cm 0 cm (Active)   Consent signed by daughter at bedside    Dillon Smith 01/04/2022, 4:06 PM

## 2022-01-04 NOTE — Progress Notes (Signed)
PHARMACY - TOTAL PARENTERAL NUTRITION CONSULT NOTE   Indication: Prolonged ileus  Patient Measurements: Height: '5\' 11"'$  (180.3 cm) Weight: 84.7 kg (186 lb 11.7 oz) IBW/kg (Calculated) : 75.3 TPN AdjBW (KG): 84.7 Body mass index is 26.04 kg/m.  Assessment: 88 yo male with perforated gastric cancer on chemotherapy s/p ex lap with intraabdominal abscess drainage 7/3. PMH includes Afib on eliquis, CKDIII, HTN, prostate cancer, CVA, and HLD. Pharmacy consulted to start TPN for possible prolonged ileus.  Glucose / Insulin: CBGs at goal of < 150 - 10 units SSI used in past 24hr Electrolytes: All WNL except Mg slightly elevated (2.6) Renal: AKI on CKDIII. SCr elevated but improving. UOP 1469m/24hr Hepatic: on 7/4 AST/ALT elevated, Tbili WNL, Alb low Intake / Output: NGT 8556m drains 9551mIVF: D5LR at 100/hr GI Imaging: 7/3 CT intra-abdominal free air which was concerning for bowel perforation GI Surgeries / Procedures:  7/3 ex lap, drainage of intraabdominal abscess  Central access: plan PICC 7/7 TPN start date: 7/7  Nutritional Goals: Goal TPN rate is 90 mL/hr (provides 119 g of protein and 2298 kcals per day)  RD Assessment: Estimated Needs Total Energy Estimated Needs: 2300-2500 kcal Total Protein Estimated Needs: 110-125 grams Total Fluid Estimated Needs: >/= 2.3 L/day  Current Nutrition:  NPO  Plan:  Start TPN at 57m57m at 1800 Electrolytes in TPN:  Na 50mE50m  K 50mEq15m Ca 5mEq/L60mMg 2mEq/L,75mhos 15mmol/L57m:Ac 1:2 Add standard MVI and trace elements to TPN Continue Sensitive q4h SSI and adjust as needed  Reduce MIVF to 60 mL/hr at 1800 Monitor TPN labs on Mon/Thurs, full labs in AM  Joannie Medine WillPeggyann Juba BCPS Pharmacy: [phone],8:44 AM

## 2022-01-05 DIAGNOSIS — Z515 Encounter for palliative care: Secondary | ICD-10-CM

## 2022-01-05 DIAGNOSIS — G893 Neoplasm related pain (acute) (chronic): Secondary | ICD-10-CM | POA: Diagnosis not present

## 2022-01-05 DIAGNOSIS — C189 Malignant neoplasm of colon, unspecified: Secondary | ICD-10-CM | POA: Diagnosis not present

## 2022-01-05 LAB — GLUCOSE, CAPILLARY
Glucose-Capillary: 155 mg/dL — ABNORMAL HIGH (ref 70–99)
Glucose-Capillary: 149 mg/dL — ABNORMAL HIGH (ref 70–99)
Glucose-Capillary: 157 mg/dL — ABNORMAL HIGH (ref 70–99)
Glucose-Capillary: 155 mg/dL — ABNORMAL HIGH (ref 70–99)
Glucose-Capillary: 157 mg/dL — ABNORMAL HIGH (ref 70–99)
Glucose-Capillary: 136 mg/dL — ABNORMAL HIGH (ref 70–99)

## 2022-01-05 LAB — CBC
HCT: 28.3 % — ABNORMAL LOW (ref 39.0–52.0)
Hemoglobin: 9.3 g/dL — ABNORMAL LOW (ref 13.0–17.0)
MCH: 32.5 pg (ref 26.0–34.0)
MCHC: 32.9 g/dL (ref 30.0–36.0)
MCV: 99 fL (ref 80.0–100.0)
Platelets: 67 10*3/uL — ABNORMAL LOW (ref 150–400)
RBC: 2.86 MIL/uL — ABNORMAL LOW (ref 4.22–5.81)
RDW: 15.9 % — ABNORMAL HIGH (ref 11.5–15.5)
WBC: 5.2 10*3/uL (ref 4.0–10.5)
nRBC: 0 % (ref 0.0–0.2)

## 2022-01-05 LAB — TRIGLYCERIDES
Triglycerides: 693 mg/dL — ABNORMAL HIGH (ref <150)
Triglycerides: 71 mg/dL (ref <150)

## 2022-01-05 LAB — COMPREHENSIVE METABOLIC PANEL
ALT: 48 U/L — ABNORMAL HIGH (ref 0–44)
AST: 44 U/L — ABNORMAL HIGH (ref 15–41)
Albumin: 1.8 g/dL — ABNORMAL LOW (ref 3.5–5.0)
Alkaline Phosphatase: 95 U/L (ref 38–126)
Anion gap: 7 (ref 5–15)
BUN: 40 mg/dL — ABNORMAL HIGH (ref 8–23)
CO2: 24 mmol/L (ref 22–32)
Calcium: 8.3 mg/dL — ABNORMAL LOW (ref 8.9–10.3)
Chloride: 114 mmol/L — ABNORMAL HIGH (ref 98–111)
Creatinine, Ser: 1.76 mg/dL — ABNORMAL HIGH (ref 0.61–1.24)
GFR, Estimated: 36 mL/min — ABNORMAL LOW (ref >60)
Glucose, Bld: 180 mg/dL — ABNORMAL HIGH (ref 70–99)
Potassium: 3.7 mmol/L (ref 3.5–5.1)
Sodium: 145 mmol/L (ref 135–145)
Total Bilirubin: 0.5 mg/dL (ref 0.3–1.2)
Total Protein: 5.3 g/dL — ABNORMAL LOW (ref 6.5–8.1)

## 2022-01-05 LAB — MAGNESIUM: Magnesium: 2.4 mg/dL (ref 1.7–2.4)

## 2022-01-05 LAB — PHOSPHORUS: Phosphorus: 2.7 mg/dL (ref 2.5–4.6)

## 2022-01-05 MED ORDER — DIPHENHYDRAMINE HCL 50 MG/ML IJ SOLN
12.5000 mg | Freq: Four times a day (QID) | INTRAMUSCULAR | Status: DC | PRN
  Administered 2022-01-05: 12.5 mg via INTRAVENOUS

## 2022-01-05 MED ORDER — FENTANYL 12 MCG/HR TD PT72
1.0000 | MEDICATED_PATCH | TRANSDERMAL | Status: DC
  Administered 2022-01-05: 1 via TRANSDERMAL
  Filled 2022-01-05: qty 1

## 2022-01-05 MED ORDER — FENTANYL 50 MCG/HR TD PT72: 1.0000 | MEDICATED_PATCH | TRANSDERMAL | Status: DC

## 2022-01-05 MED ORDER — DIPHENHYDRAMINE HCL 50 MG/ML IJ SOLN
25.0000 mg | Freq: Every day | INTRAMUSCULAR | Status: DC
  Administered 2022-01-05 – 2022-01-14 (×8): 25 mg via INTRAVENOUS
  Filled 2022-01-05 (×10): qty 1

## 2022-01-05 MED ORDER — TRAVASOL 10 % IV SOLN
INTRAVENOUS | Status: AC
  Filled 2022-01-05: qty 792

## 2022-01-05 MED ORDER — HALOPERIDOL LACTATE 5 MG/ML IJ SOLN
2.5000 mg | Freq: Once | INTRAMUSCULAR | Status: AC
Start: 2022-01-06 — End: 2022-01-05
  Administered 2022-01-05: 2.5 mg via INTRAVENOUS
  Filled 2022-01-05: qty 1

## 2022-01-05 MED ORDER — NALOXONE HCL 0.4 MG/ML IJ SOLN: 0.4000 mg | INTRAMUSCULAR | Status: DC | PRN

## 2022-01-05 MED ORDER — SODIUM CHLORIDE 0.9% FLUSH: 9.0000 mL | INTRAVENOUS | Status: DC | PRN

## 2022-01-05 MED ORDER — SODIUM CHLORIDE 0.9 % IV SOLN: INTRAVENOUS | Status: DC

## 2022-01-05 MED ORDER — BISACODYL 10 MG RE SUPP
10.0000 mg | Freq: Every day | RECTAL | Status: DC | PRN
Start: 2022-01-05 — End: 2022-01-22
  Administered 2022-01-05 – 2022-01-22 (×2): 10 mg via RECTAL
  Filled 2022-01-05 (×2): qty 1

## 2022-01-05 MED ORDER — FENTANYL 25 MCG/HR TD PT72
1.0000 | MEDICATED_PATCH | TRANSDERMAL | Status: DC
  Administered 2022-01-05 – 2022-01-11 (×3): 1 via TRANSDERMAL
  Filled 2022-01-05 (×3): qty 1

## 2022-01-05 MED ORDER — DIPHENHYDRAMINE HCL 12.5 MG/5ML PO ELIX: 12.5000 mg | ORAL_SOLUTION | Freq: Four times a day (QID) | ORAL | Status: DC | PRN

## 2022-01-05 MED ORDER — FENTANYL 50 MCG/ML IV PCA SOLN
INTRAVENOUS | Status: DC
  Administered 2022-01-05: 45 ug via INTRAVENOUS
  Administered 2022-01-06: 70 ug via INTRAVENOUS
  Administered 2022-01-06: 15 ug via INTRAVENOUS
  Administered 2022-01-06: 90 ug via INTRAVENOUS
  Administered 2022-01-06: 60 ug via INTRAVENOUS
  Administered 2022-01-06: 75 ug via INTRAVENOUS
  Administered 2022-01-06: 105 ug via INTRAVENOUS
  Administered 2022-01-07: 60 ug via INTRAVENOUS
  Administered 2022-01-07: 75 ug via INTRAVENOUS
  Administered 2022-01-07: 50 ug via INTRAVENOUS
  Administered 2022-01-08: 60 ug via INTRAVENOUS

## 2022-01-05 MED ORDER — FENTANYL CITRATE (PF) 100 MCG/2ML IJ SOLN
INTRAMUSCULAR | Status: AC
  Administered 2022-01-05: 100 ug
  Filled 2022-01-05: qty 2

## 2022-01-05 MED ORDER — LIP MEDEX EX OINT
TOPICAL_OINTMENT | CUTANEOUS | Status: DC | PRN
  Administered 2022-01-05: 75 via TOPICAL
  Filled 2022-01-05: qty 7

## 2022-01-05 MED ORDER — POTASSIUM CHLORIDE 10 MEQ/50ML IV SOLN
10.0000 meq | Freq: Once | INTRAVENOUS | Status: AC
Start: 2022-01-05 — End: 2022-01-05
  Administered 2022-01-05: 10 meq via INTRAVENOUS
  Filled 2022-01-05: qty 50

## 2022-01-05 NOTE — Progress Notes (Signed)
95 Palliative care MD, Dr. Hilma Favors here to see pt. RN provided her an update. Pt heard moaning and groaning from nurses' station. Informed MD that pt has been given pain meds around 1430 and 1530. Brought to RN's attention that pt has received 364mg in the last 24 hours. Encouraged to page surgery for scan or change/ increase dose of pain meds. Fentanyl patch ordered by palliative MD. Awaiting call back from surgery.

## 2022-01-05 NOTE — Progress Notes (Signed)
Pt asking RN to call daughter [name]. Dgt walking down the hallway.

## 2022-01-05 NOTE — Progress Notes (Signed)
Assessment & Plan: POD#5 - status post Ex lap with DRAINAGE OF INTRAABDOMINAL ABSCESS  NG pulled out during the night - leave out for now, NPO except limited ice chips TPN per pharmacy Continue zosyn AKI stabilizing - Cr. 2.31 this AM Gastric cancer - recent chemo Rx  Will plan to keep NPO with NG out.  If improved bowel function next 24-48 hours, will get upper GI series to rule out gastric leak prior to starting po diet.  Monitor drain output.  Wound care.        [name], MD [facility] A [place] practice Office: [phone]        Chief Complaint: Perforated gastric cancer  Subjective: Patient in bed, comfortable, responsive.  Nurse at bedside.  Objective: Vital signs in last 24 hours: Temp:  [97.6 F (36.4 C)-98.7 F (37.1 C)] 97.6 F (36.4 C) (07/08 0800) Pulse Rate:  [49-92] 80 (07/08 0800) Resp:  [10-26] 17 (07/08 0800) BP: (122-162)/(49-83) 158/72 (07/08 0800) SpO2:  [94 %-100 %] 99 % (07/08 0800) Weight:  [89.6 kg] 89.6 kg (07/07 0908) Last BM Date : 12/28/21  Intake/Output from previous day: 07/07 0701 - 07/08 0700 In: 2764.7 [I.V.:2625.7; IV Piggyback:139] Out: 675 [Urine:625; Emesis/NG output:15; Drains:35] Intake/Output this shift: No intake/output data recorded.  Physical Exam: HEENT - sclerae clear, mucous membranes moist Neck - soft Abdomen - soft, few BS present; dressing intact; JP drains with thin serous output   Lab Results:  Recent Labs    01/04/22 0300 01/05/22 0529  WBC 8.9 5.2  HGB 9.5* 9.3*  HCT 27.8* 28.3*  PLT 86* 67*   BMET Recent Labs    01/04/22 0300 01/05/22 0813  NA 144 145  K 3.7 3.7  CL 113* 114*  CO2 25 24  GLUCOSE 130* 180*  BUN 44* 40*  CREATININE 2.31* 1.76*  CALCIUM 8.3* 8.3*   PT/INR No results for input(s): "LABPROT", "INR" in the last 72 hours. Comprehensive Metabolic Panel:    Component Value Date/Time   NA 145 01/05/2022 0813   NA 144 01/04/2022 0300   NA 143 02/06/2021  0845   NA 141 07/06/2020 1156   K 3.7 01/05/2022 0813   K 3.7 01/04/2022 0300   CL 114 (H) 01/05/2022 0813   CL 113 (H) 01/04/2022 0300   CO2 24 01/05/2022 0813   CO2 25 01/04/2022 0300   BUN 40 (H) 01/05/2022 0813   BUN 44 (H) 01/04/2022 0300   BUN 23 02/06/2021 0845   BUN 22 07/06/2020 1156   CREATININE 1.76 (H) 01/05/2022 0813   CREATININE 2.31 (H) 01/04/2022 0300   CREATININE 1.57 (H) 12/13/2021 0827   CREATININE 1.46 (H) 11/23/2021 0905   GLUCOSE 180 (H) 01/05/2022 0813   GLUCOSE 130 (H) 01/04/2022 0300   CALCIUM 8.3 (L) 01/05/2022 0813   CALCIUM 8.3 (L) 01/04/2022 0300   AST 44 (H) 01/05/2022 0813   AST 98 (H) 01/01/2022 0250   AST 18 12/13/2021 0827   AST 21 11/23/2021 0905   ALT 48 (H) 01/05/2022 0813   ALT 81 (H) 01/01/2022 0250   ALT 17 12/13/2021 0827   ALT 20 11/23/2021 0905   ALKPHOS 95 01/05/2022 0813   ALKPHOS 55 01/01/2022 0250   BILITOT 0.5 01/05/2022 0813   BILITOT 0.9 01/01/2022 0250   BILITOT 0.5 12/13/2021 0827   BILITOT 0.4 11/23/2021 0905   PROT 5.3 (L) 01/05/2022 0813   PROT 4.9 (L) 01/01/2022 0250   PROT 6.4 02/06/2021 0845  PROT 7.2 07/06/2020 1156   ALBUMIN 1.8 (L) 01/05/2022 0813   ALBUMIN 2.2 (L) 01/01/2022 0250   ALBUMIN 3.9 02/06/2021 0845   ALBUMIN 4.3 07/06/2020 1156    Studies/Results: DG Chest Port 1 View  Result Date: 01/04/2022 CLINICAL DATA:  Status post PICC placement EXAM: PORTABLE CHEST 1 VIEW COMPARISON:  12/31/2021 chest radiograph. FINDINGS: Left PICC terminates at the cavoatrial junction. Left internal jugular central venous catheter terminates in the upper third of the SVC. Right internal jugular Port-A-Cath terminates at the cavoatrial junction. Intact sternotomy wires. Enteric tube enters stomach with the tip not seen on this image. Stable cardiomediastinal silhouette with mild cardiomegaly. No pneumothorax. Left retrocardiac opacity and small left pleural effusion, new. No right pleural effusion. No pulmonary edema.  IMPRESSION: 1. Left PICC terminates at the cavoatrial junction. 2. New small left pleural effusion. New left retrocardiac opacity, favor atelectasis. 3. Stable mild cardiomegaly.  No pulmonary edema. Electronically Signed   By: Ilona Sorrel M.D.   On: 01/04/2022 16:40   [name] EKG SITE RITE  Result Date: 01/04/2022 If Site Rite image not attached, placement could not be confirmed due to current cardiac rhythm.  ECHOCARDIOGRAM COMPLETE  Result Date: 01/03/2022    ECHOCARDIOGRAM REPORT   Patient Name:   Dillon Smith Date of Exam: 01/03/2022 Medical Rec #:  [id number]        Height:       71.0 in Accession #:    [id number]       Weight:       186.7 lb Date of Birth:  11/29/1929        BSA:          2.048 m Patient Age:    88 years         BP:           133/74 mmHg Patient Gender: M                HR:           97 bpm. Exam Location:  Inpatient Procedure: 2D Echo, Cardiac Doppler, Color Doppler and Strain Analysis Indications:    dyspnea  History:        Patient has prior history of Echocardiogram examinations, most                 recent 08/16/2019. CAD, Arrythmias:Atrial Fibrillation; Risk                 Factors:Hypertension.  Sonographer:    Joette Catching RCS Referring Phys: [id number] Hortencia Conradi MEIER  Sonographer Comments: Global longitudinal strain was attempted. IMPRESSIONS  1. Left ventricular ejection fraction, by estimation, is 25 to 30%. The left ventricle has severely decreased function. The left ventricle demonstrates global hypokinesis. Left ventricular diastolic parameters are consistent with Grade II diastolic dysfunction (pseudonormalization). The average left ventricular global longitudinal strain is -8.0 %. The global longitudinal strain is abnormal.  2. Right ventricular systolic function is mildly reduced. The right ventricular size is moderately enlarged. There is mildly elevated pulmonary artery systolic pressure. The estimated right ventricular systolic pressure is 81.8 mmHg.  3. Left atrial  size was severely dilated.  4. Right atrial size was moderately dilated.  5. The mitral valve is normal in structure. Moderate mitral valve regurgitation. No evidence of mitral stenosis.  6. Tricuspid valve regurgitation is moderate.  7. The aortic valve is tricuspid. There is mild calcification of the aortic valve. There is mild thickening of the aortic valve. Aortic  valve regurgitation is severe. Aortic valve sclerosis is present, with no evidence of aortic valve stenosis.  8. The inferior vena cava is normal in size with greater than 50% respiratory variability, suggesting right atrial pressure of 3 mmHg. Comparison(s): Prior images reviewed side by side. FINDINGS  Left Ventricle: Left ventricular ejection fraction, by estimation, is 25 to 30%. The left ventricle has severely decreased function. The left ventricle demonstrates global hypokinesis. The average left ventricular global longitudinal strain is -8.0 %. The global longitudinal strain is abnormal. The left ventricular internal cavity size was normal in size. There is no left ventricular hypertrophy. Left ventricular diastolic parameters are consistent with Grade II diastolic dysfunction (pseudonormalization).  LV Wall Scoring: The mid anteroseptal segment and mid inferoseptal segment are akinetic. Right Ventricle: The right ventricular size is moderately enlarged. No increase in right ventricular wall thickness. Right ventricular systolic function is mildly reduced. There is mildly elevated pulmonary artery systolic pressure. The tricuspid regurgitant velocity is 3.06 m/s, and with an assumed right atrial pressure of 3 mmHg, the estimated right ventricular systolic pressure is 34.7 mmHg. Left Atrium: Left atrial size was severely dilated. Right Atrium: Right atrial size was moderately dilated. Pericardium: There is no evidence of pericardial effusion. Mitral Valve: The mitral valve is normal in structure. Moderate mitral valve regurgitation. No evidence  of mitral valve stenosis. Tricuspid Valve: The tricuspid valve is normal in structure. Tricuspid valve regurgitation is moderate . No evidence of tricuspid stenosis. Aortic Valve: The aortic valve is tricuspid. There is mild calcification of the aortic valve. There is mild thickening of the aortic valve. Aortic valve regurgitation is severe. Aortic regurgitation PHT measures 648 msec. Aortic valve sclerosis is present, with no evidence of aortic valve stenosis. Aortic valve mean gradient measures 3.5 mmHg. Aortic valve peak gradient measures 6.3 mmHg. Aortic valve area, by VTI measures 2.74 cm. Pulmonic Valve: The pulmonic valve was normal in structure. Pulmonic valve regurgitation is trivial. No evidence of pulmonic stenosis. Aorta: The aortic root is normal in size and structure. Venous: The inferior vena cava is normal in size with greater than 50% respiratory variability, suggesting right atrial pressure of 3 mmHg. IAS/Shunts: No atrial level shunt detected by color flow Doppler.  LEFT VENTRICLE PLAX 2D LVIDd:         5.90 cm      Diastology LVIDs:         4.90 cm      LV e' medial:    7.72 cm/s LV PW:         1.00 cm      LV E/e' medial:  11.5 LV IVS:        1.00 cm      LV e' lateral:   12.90 cm/s LVOT diam:     2.20 cm      LV E/e' lateral: 6.9 LV SV:         50 LV SV Index:   25           2D Longitudinal Strain LVOT Area:     3.80 cm     2D Strain GLS Avg:     -8.0 %  LV Volumes (MOD) LV vol d, MOD A2C: 138.0 ml LV vol d, MOD A4C: 181.0 ml LV vol s, MOD A2C: 127.0 ml LV vol s, MOD A4C: 95.1 ml LV SV MOD A2C:     11.0 ml LV SV MOD A4C:     181.0 ml LV SV MOD BP:  46.3 ml RIGHT VENTRICLE RV Basal diam:  5.45 cm RV Mid diam:    3.70 cm RV S prime:     14.80 cm/s TAPSE (M-mode): 1.4 cm LEFT ATRIUM              Index        RIGHT ATRIUM           Index LA diam:        5.10 cm  2.49 cm/m   RA Area:     23.70 cm LA Vol (A2C):   156.0 ml 76.17 ml/m  RA Volume:   84.20 ml  41.11 ml/m LA Vol (A4C):   114.0  ml 55.67 ml/m LA Biplane Vol: 141.0 ml 68.85 ml/m  AORTIC VALVE                    PULMONIC VALVE AV Area (Vmax):    2.69 cm     PV Vmax:          0.80 m/s AV Area (Vmean):   2.79 cm     PV Peak grad:     2.5 mmHg AV Area (VTI):     2.74 cm     PR End Diast Vel: 10.98 msec AV Vmax:           125.10 cm/s AV Vmean:          79.200 cm/s AV VTI:            0.183 m AV Peak Grad:      6.3 mmHg AV Mean Grad:      3.5 mmHg LVOT Vmax:         88.55 cm/s LVOT Vmean:        58.050 cm/s LVOT VTI:          0.132 m LVOT/AV VTI ratio: 0.72 AI PHT:            648 msec  AORTA Ao Root diam: 3.90 cm Ao Asc diam:  3.90 cm MITRAL VALVE                  TRICUSPID VALVE MV Area (PHT): 4.06 cm       TR Peak grad:   37.5 mmHg MV Decel Time: 187 msec       TR Vmax:        306.00 cm/s MR Peak grad:    112.4 mmHg MR Mean grad:    68.0 mmHg    SHUNTS MR Vmax:         530.00 cm/s  Systemic VTI:  0.13 m MR Vmean:        388.0 cm/s   Systemic Diam: 2.20 cm MR PISA:         0.57 cm MR PISA Eff ROA: 4 mm MR PISA Radius:  0.30 cm MV E velocity: 88.70 cm/s MV A velocity: 29.10 cm/s MV E/A ratio:  3.05 Candee Furbish MD Electronically signed by Candee Furbish MD Signature Date/Time: 01/03/2022/1:04:41 PM    Final       [name] 01/05/2022   Patient ID: Wonda Horner, male   DOB: 11/29/1929, 88 y.o.   MRN: [id number]

## 2022-01-05 NOTE — Progress Notes (Signed)
PHARMACY - TOTAL PARENTERAL NUTRITION CONSULT NOTE   Indication: Prolonged ileus  Patient Measurements: Height: '5\' 11"'$  (180.3 cm) Weight: 89.6 kg (197 lb 8.5 oz) IBW/kg (Calculated) : 75.3 TPN AdjBW (KG): 84.7 Body mass index is 27.55 kg/m.  Assessment: 88 yo male with perforated gastric cancer on chemotherapy s/p ex lap with intraabdominal abscess drainage 7/3. PMH includes Afib on eliquis, CKDIII, HTN, prostate cancer, CVA, and HLD. Pharmacy consulted to start TPN for possible prolonged ileus.  Glucose / Insulin: CBGs at goal of < 150 - 6 units SSI used since starting TPN Electrolytes: All WNL except Cl slightly elevated  Renal: AKI on CKDIII. SCr elevated but improving. UOP incomplete documentation Hepatic: AST/ALT elevated but improved, Tbili WNL, Alb low Triglycerides WNL Intake / Output: NGT pulled out by patient, drains 90m MIVF: D5LR at 100/hr [facility] Imaging: 7/3 CT intra-abdominal free air which was concerning for bowel perforation GI Surgeries / Procedures:  7/3 ex lap, drainage of intraabdominal abscess  Central access: plan PICC 7/7 TPN start date: 7/7  Nutritional Goals: Goal TPN rate is 90 mL/hr (provides 119 g of protein and 2298 kcals per day)  RD Assessment: Estimated Needs Total Energy Estimated Needs: 2300-2500 kcal Total Protein Estimated Needs: 110-125 grams Total Fluid Estimated Needs: >/= 2.3 L/day  Current Nutrition:  NPO  Plan:  Potassium chloride 136m IV x 1 Increase TPN to 6071mr at 1800 Electrolytes in TPN:  Na 85m2m,  K 85mE75m  Ca 5mEq/41m Mg 0mEq/L34memove) Phos 15mmol/38ml:Ac max acetate Add standard MVI and trace elements to TPN Continue Sensitive q4h SSI and adjust as needed  Monitor TPN labs on Mon/Thurs, full labs in AM  Malacki Mcphearson WilPeggyann Juba, BCPS Pharmacy: [phone],9:06 AM

## 2022-01-06 LAB — COMPREHENSIVE METABOLIC PANEL
ALT: 41 U/L (ref 0–44)
AST: 34 U/L (ref 15–41)
Albumin: 1.9 g/dL — ABNORMAL LOW (ref 3.5–5.0)
Alkaline Phosphatase: 89 U/L (ref 38–126)
Anion gap: 6 (ref 5–15)
BUN: 37 mg/dL — ABNORMAL HIGH (ref 8–23)
CO2: 26 mmol/L (ref 22–32)
Calcium: 8.7 mg/dL — ABNORMAL LOW (ref 8.9–10.3)
Chloride: 117 mmol/L — ABNORMAL HIGH (ref 98–111)
Creatinine, Ser: 1.79 mg/dL — ABNORMAL HIGH (ref 0.61–1.24)
GFR, Estimated: 36 mL/min — ABNORMAL LOW (ref >60)
Glucose, Bld: 171 mg/dL — ABNORMAL HIGH (ref 70–99)
Potassium: 3.7 mmol/L (ref 3.5–5.1)
Sodium: 149 mmol/L — ABNORMAL HIGH (ref 135–145)
Total Bilirubin: 0.7 mg/dL (ref 0.3–1.2)
Total Protein: 5.6 g/dL — ABNORMAL LOW (ref 6.5–8.1)

## 2022-01-06 LAB — MAGNESIUM: Magnesium: 2.3 mg/dL (ref 1.7–2.4)

## 2022-01-06 LAB — CBC
HCT: 30.1 % — ABNORMAL LOW (ref 39.0–52.0)
Hemoglobin: 10.2 g/dL — ABNORMAL LOW (ref 13.0–17.0)
MCH: 30.7 pg (ref 26.0–34.0)
MCHC: 33.9 g/dL (ref 30.0–36.0)
MCV: 90.7 fL (ref 80.0–100.0)
Platelets: 68 10*3/uL — ABNORMAL LOW (ref 150–400)
RBC: 3.32 MIL/uL — ABNORMAL LOW (ref 4.22–5.81)
RDW: 14.8 % (ref 11.5–15.5)
WBC: 5.9 10*3/uL (ref 4.0–10.5)
nRBC: 0 % (ref 0.0–0.2)

## 2022-01-06 LAB — GLUCOSE, CAPILLARY
Glucose-Capillary: 143 mg/dL — ABNORMAL HIGH (ref 70–99)
Glucose-Capillary: 151 mg/dL — ABNORMAL HIGH (ref 70–99)
Glucose-Capillary: 151 mg/dL — ABNORMAL HIGH (ref 70–99)
Glucose-Capillary: 160 mg/dL — ABNORMAL HIGH (ref 70–99)
Glucose-Capillary: 163 mg/dL — ABNORMAL HIGH (ref 70–99)
Glucose-Capillary: 168 mg/dL — ABNORMAL HIGH (ref 70–99)

## 2022-01-06 LAB — CULTURE, BLOOD (ROUTINE X 2): Special Requests: ADEQUATE

## 2022-01-06 LAB — PHOSPHORUS: Phosphorus: 2.6 mg/dL (ref 2.5–4.6)

## 2022-01-06 MED ORDER — HALOPERIDOL LACTATE 5 MG/ML IJ SOLN
1.0000 mg | Freq: Four times a day (QID) | INTRAMUSCULAR | Status: DC | PRN
  Administered 2022-01-09 – 2022-01-16 (×3): 1 mg via INTRAVENOUS
  Filled 2022-01-06 (×3): qty 1

## 2022-01-06 MED ORDER — POTASSIUM CHLORIDE 10 MEQ/50ML IV SOLN
10.0000 meq | Freq: Once | INTRAVENOUS | Status: AC
  Administered 2022-01-06: 10 meq via INTRAVENOUS
  Filled 2022-01-06: qty 50

## 2022-01-06 MED ORDER — TRAVASOL 10 % IV SOLN
INTRAVENOUS | Status: AC
  Filled 2022-01-06: qty 1188

## 2022-01-06 NOTE — Progress Notes (Signed)
PHARMACY - TOTAL PARENTERAL NUTRITION CONSULT NOTE   Indication: Prolonged ileus  Patient Measurements: Height: '5\' 11"'$  (180.3 cm) Weight: 89.6 kg (197 lb 8.5 oz) IBW/kg (Calculated) : 75.3 TPN AdjBW (KG): 84.7 Body mass index is 27.55 kg/m.  Assessment: 88 yo male with perforated gastric cancer on chemotherapy s/p ex lap with intraabdominal abscess drainage 7/3. PMH includes Afib on eliquis, CKDIII, HTN, prostate cancer, CVA, and HLD. Pharmacy consulted to start TPN for possible prolonged ileus.  Glucose / Insulin: CBGs near goal of < 150 - 10 units SSI used in past 24hr Electrolytes: Na/Cl and Corr Ca elevated  Renal: AKI on CKDIII. SCr elevated but but overall improving. UOP 1476m/24hr Hepatic: AST/ALT improved to WNL, Tbili WNL, Alb low Triglycerides WNL Intake / Output: NGT pulled out by patient, drains 222mMIVF: [facility] Imaging: 7/3 CT intra-abdominal free air which was concerning for bowel perforation GI Surgeries / Procedures:  7/3 ex lap, drainage of intraabdominal abscess  Central access: plan PICC 7/7 TPN start date: 7/7  Nutritional Goals: Goal TPN rate is 90 mL/hr (provides 119 g of protein and 2298 kcals per day)  RD Assessment: Estimated Needs Total Energy Estimated Needs: 2300-2500 kcal Total Protein Estimated Needs: 110-125 grams Total Fluid Estimated Needs: >/= 2.3 L/day  Current Nutrition:  NPO except ice chips  Plan:  Potassium chloride 105mIV x 1 Increase TPN to goal 38m5m at 1800 Electrolytes in TPN:  Na 20mE72m(decrease) K 50mEq74m Ca 2mEq/L75mecrease) Mg 0mEq/L 85ms 15mmol/L80m:Ac max acetate Add standard MVI and trace elements to TPN Continue Sensitive q4h SSI and adjust as needed  Monitor TPN labs on Mon/Thurs  Maiyah Goyne WillPeggyann Juba BCPS Pharmacy: [phone],7:07 AM

## 2022-01-06 NOTE — Progress Notes (Signed)
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  He was taken for urgent exploratory laparotomy on 7/3 after an extensive discussion was had and documented by general surgery regarding goals of care and risk benefits of proceeding with surgical intervention.   This patient had a very high functional status prior to admission, he was physically strong and independent.  He lives alone in [place] and has 2 daughters who live out of state but are actively involved in his decision making and care.    This patient receives the majority of his primary care through the [facility] system.  Assessment and Recommendations:  Patient having significant amounts of pain today. He is moaning and grimacing in the bed. Has received 425-325 mcg of Fentanyl IV in the last 24 hours.RN reports IV fentanyl only holds his pain for about an hour. Increased concern for worsening intraabdominal process. Requested RN contact [facility] for further evaluation if pain continues. Given the extent of his cancer and consistent need for pain control which may be an ongoing challenge, will place a 37.5 duragesic patch for basal pain control.  Will follow for pain and symptom management needs.  Lane Hacker, DO Palliative Medicine   Time: 50 minutes

## 2022-01-06 NOTE — Progress Notes (Signed)
Assessment & Plan: POD#6 - status post ex lap with DRAINAGE OF INTRAABDOMINAL ABSCESS             NG out > 24 hours, NPO except limited ice chips TPN per pharmacy Continue zosyn AKI stabilizing - Cr 1.79 this AM Gastric cancer - recent chemo Rx Haldol for nighttime agitation   Will plan to keep NPO with NG out.  If improved bowel function next 24-48 hours, will get upper GI series to rule out gastric leak prior to starting po diet.  Monitor drain output.  Wound care.        [name], MD [facility] A [place] practice Office: [phone]        Chief Complaint: Advanced gastric cancer  Subjective: Patient in bed, comfortable, responsive.  Family at bedside.  Objective: Vital signs in last 24 hours: Temp:  [97.7 F (36.5 C)-98.5 F (36.9 C)] 97.7 F (36.5 C) (07/09 0755) Pulse Rate:  [64-144] 82 (07/09 0800) Resp:  [12-26] 14 (07/09 0800) BP: (107-169)/(48-102) 157/79 (07/09 0800) SpO2:  [92 %-100 %] 98 % (07/09 0800) FiO2 (%):  [0 %] 0 % (07/09 0800) Last BM Date : 12/28/21  Intake/Output from previous day: 07/08 0701 - 07/09 0700 In: 1847 [I.V.:1667.2; IV Piggyback:179.8] Out: 1428 [Urine:1400; Drains:28] Intake/Output this shift: Total I/O In: 89.1 [I.V.:76.7; IV Piggyback:12.4] Out: 165 [Urine:150; Drains:15]  Physical Exam: HEENT - sclerae clear, mucous membranes moist Neck - soft Abdomen - soft without distension; non-tender; both JP drains with small serous output   Lab Results:  Recent Labs    01/05/22 0529 01/06/22 0510  WBC 5.2 5.9  HGB 9.3* 10.2*  HCT 28.3* 30.1*  PLT 67* 68*   BMET Recent Labs    01/05/22 0813 01/06/22 0510  NA 145 149*  K 3.7 3.7  CL 114* 117*  CO2 24 26  GLUCOSE 180* 171*  BUN 40* 37*  CREATININE 1.76* 1.79*  CALCIUM 8.3* 8.7*   PT/INR No results for input(s): "LABPROT", "INR" in the last 72 hours. Comprehensive Metabolic Panel:    Component Value Date/Time   NA 149 (H) 01/06/2022  0510   NA 145 01/05/2022 0813   NA 143 02/06/2021 0845   NA 141 07/06/2020 1156   K 3.7 01/06/2022 0510   K 3.7 01/05/2022 0813   CL 117 (H) 01/06/2022 0510   CL 114 (H) 01/05/2022 0813   CO2 26 01/06/2022 0510   CO2 24 01/05/2022 0813   BUN 37 (H) 01/06/2022 0510   BUN 40 (H) 01/05/2022 0813   BUN 23 02/06/2021 0845   BUN 22 07/06/2020 1156   CREATININE 1.79 (H) 01/06/2022 0510   CREATININE 1.76 (H) 01/05/2022 0813   CREATININE 1.57 (H) 12/13/2021 0827   CREATININE 1.46 (H) 11/23/2021 0905   GLUCOSE 171 (H) 01/06/2022 0510   GLUCOSE 180 (H) 01/05/2022 0813   CALCIUM 8.7 (L) 01/06/2022 0510   CALCIUM 8.3 (L) 01/05/2022 0813   AST 34 01/06/2022 0510   AST 44 (H) 01/05/2022 0813   AST 18 12/13/2021 0827   AST 21 11/23/2021 0905   ALT 41 01/06/2022 0510   ALT 48 (H) 01/05/2022 0813   ALT 17 12/13/2021 0827   ALT 20 11/23/2021 0905   ALKPHOS 89 01/06/2022 0510   ALKPHOS 95 01/05/2022 0813   BILITOT 0.7 01/06/2022 0510   BILITOT 0.5 01/05/2022 0813   BILITOT 0.5 12/13/2021 0827   BILITOT 0.4 11/23/2021 0905   PROT 5.6 (L) 01/06/2022 0510  PROT 5.3 (L) 01/05/2022 0813   PROT 6.4 02/06/2021 0845   PROT 7.2 07/06/2020 1156   ALBUMIN 1.9 (L) 01/06/2022 0510   ALBUMIN 1.8 (L) 01/05/2022 0813   ALBUMIN 3.9 02/06/2021 0845   ALBUMIN 4.3 07/06/2020 1156    Studies/Results: DG Chest Port 1 View  Result Date: 01/04/2022 CLINICAL DATA:  Status post PICC placement EXAM: PORTABLE CHEST 1 VIEW COMPARISON:  12/31/2021 chest radiograph. FINDINGS: Left PICC terminates at the cavoatrial junction. Left internal jugular central venous catheter terminates in the upper third of the SVC. Right internal jugular Port-A-Cath terminates at the cavoatrial junction. Intact sternotomy wires. Enteric tube enters stomach with the tip not seen on this image. Stable cardiomediastinal silhouette with mild cardiomegaly. No pneumothorax. Left retrocardiac opacity and small left pleural effusion, new. No right  pleural effusion. No pulmonary edema. IMPRESSION: 1. Left PICC terminates at the cavoatrial junction. 2. New small left pleural effusion. New left retrocardiac opacity, favor atelectasis. 3. Stable mild cardiomegaly.  No pulmonary edema. Electronically Signed   By: Ilona Sorrel M.D.   On: 01/04/2022 16:40      [name] 01/06/2022   Patient ID: Wonda Horner, male   DOB: 08/29/1929, 88 y.o.   MRN: [id number]

## 2022-01-06 NOTE — Progress Notes (Signed)
Pharmacy Antibiotic Note  Dillon Smith is a 88 y.o. male with perforated gastric cancer admitted on 12/31/2021 with abdominal pain, s/p ex lap with drainage of intra abdominal abscess, 2 drains in place.  Pharmacy has been consulted for Zosyn dosing for bacteroides bacteremia.  Day #7 Zosyn - Afebrile - WBC WNL - SCr improving (peaked at 2.96), CrCl ~30 ml/min  Plan: Continue Zosyn 3.375gm IV q8h (4hr extended infusions) Consider narrowing to Unasyn  Height: '5\' 11"'$  (180.3 cm) Weight: 89.6 kg (197 lb 8.5 oz) IBW/kg (Calculated) : 75.3  Temp (24hrs), Avg:98.2 F (36.8 C), Min:97.7 F (36.5 C), Max:98.5 F (36.9 C)  Recent Labs  Lab 12/31/21 1141 12/31/21 1428 12/31/21 2127 01/01/22 0250 01/01/22 0854 01/01/22 1505 01/02/22 0300 01/02/22 0315 01/03/22 0322 01/04/22 0300 01/05/22 0529 01/05/22 0813 01/06/22 0510  WBC  --   --  2.4* 4.7 8.0   < > 15.0*  --  14.4* 8.9 5.2  --  5.9  CREATININE  --   --  1.66* 2.20*  --   --   --  2.96* 2.52* 2.31*  --  1.76* 1.79*  LATICACIDVEN 4.3* 2.9* 4.1* 5.5* 4.6*  --   --   --   --   --   --   --   --    < > = values in this interval not displayed.    Estimated Creatinine Clearance: 29.2 mL/min (A) (by C-G formula based on SCr of 1.79 mg/dL (H)).    Allergies  Allergen Reactions   Penicillins Itching    7/3 Cefepime x 1 7/3 Vanc >> 7/4 7/3 Zosyn >>  Dose adjustments this admission: 7/7 Zosyn 2.25g q8h > 3.375g q8h for improved CrCl  Microbiology results: 7/3 Bcx: 1/4 bacteroides fragilis  Thank you for allowing pharmacy to be a part of this patient's care.  Peggyann Juba, PharmD, BCPS Pharmacy: [phone] 01/06/2022 8:41 AM

## 2022-01-07 ENCOUNTER — Inpatient Hospital Stay (HOSPITAL_COMMUNITY): Payer: Medicare Other

## 2022-01-07 LAB — TRIGLYCERIDES: Triglycerides: 52 mg/dL (ref <150)

## 2022-01-07 LAB — COMPREHENSIVE METABOLIC PANEL
ALT: 36 U/L (ref 0–44)
AST: 30 U/L (ref 15–41)
Albumin: 1.8 g/dL — ABNORMAL LOW (ref 3.5–5.0)
Alkaline Phosphatase: 84 U/L (ref 38–126)
Anion gap: 5 (ref 5–15)
BUN: 38 mg/dL — ABNORMAL HIGH (ref 8–23)
CO2: 26 mmol/L (ref 22–32)
Calcium: 8.7 mg/dL — ABNORMAL LOW (ref 8.9–10.3)
Chloride: 117 mmol/L — ABNORMAL HIGH (ref 98–111)
Creatinine, Ser: 1.5 mg/dL — ABNORMAL HIGH (ref 0.61–1.24)
GFR, Estimated: 44 mL/min — ABNORMAL LOW (ref >60)
Glucose, Bld: 150 mg/dL — ABNORMAL HIGH (ref 70–99)
Potassium: 4.2 mmol/L (ref 3.5–5.1)
Sodium: 148 mmol/L — ABNORMAL HIGH (ref 135–145)
Total Bilirubin: 0.7 mg/dL (ref 0.3–1.2)
Total Protein: 5.6 g/dL — ABNORMAL LOW (ref 6.5–8.1)

## 2022-01-07 LAB — CBC
HCT: 28.4 % — ABNORMAL LOW (ref 39.0–52.0)
Hemoglobin: 9.6 g/dL — ABNORMAL LOW (ref 13.0–17.0)
MCH: 30.9 pg (ref 26.0–34.0)
MCHC: 33.8 g/dL (ref 30.0–36.0)
MCV: 91.3 fL (ref 80.0–100.0)
Platelets: 103 10*3/uL — ABNORMAL LOW (ref 150–400)
RBC: 3.11 MIL/uL — ABNORMAL LOW (ref 4.22–5.81)
RDW: 14.9 % (ref 11.5–15.5)
WBC: 5.7 10*3/uL (ref 4.0–10.5)
nRBC: 0 % (ref 0.0–0.2)

## 2022-01-07 LAB — GLUCOSE, CAPILLARY
Glucose-Capillary: 136 mg/dL — ABNORMAL HIGH (ref 70–99)
Glucose-Capillary: 144 mg/dL — ABNORMAL HIGH (ref 70–99)
Glucose-Capillary: 169 mg/dL — ABNORMAL HIGH (ref 70–99)
Glucose-Capillary: 175 mg/dL — ABNORMAL HIGH (ref 70–99)
Glucose-Capillary: 186 mg/dL — ABNORMAL HIGH (ref 70–99)

## 2022-01-07 LAB — MAGNESIUM: Magnesium: 2.1 mg/dL (ref 1.7–2.4)

## 2022-01-07 LAB — PHOSPHORUS: Phosphorus: 2.8 mg/dL (ref 2.5–4.6)

## 2022-01-07 MED ORDER — HEPARIN SOD (PORK) LOCK FLUSH 100 UNIT/ML IV SOLN
500.0000 [IU] | INTRAVENOUS | Status: DC | PRN
  Filled 2022-01-07: qty 5

## 2022-01-07 MED ORDER — TRAVASOL 10 % IV SOLN
INTRAVENOUS | Status: AC
  Filled 2022-01-07: qty 1188

## 2022-01-07 MED ORDER — IOHEXOL 300 MG/ML  SOLN
150.0000 mL | Freq: Once | INTRAMUSCULAR | Status: AC | PRN
  Administered 2022-01-07: 3 mL via ORAL

## 2022-01-07 NOTE — Progress Notes (Addendum)
[facility] Progress Note  7 Days Post-Op  Subjective: CC:  Denies abdominal pain currently, but is very somnolent during my exam - snoring but arouses to answer my questions appropriately.  Reports he is having flatus.  Denies any bowel movements but, per RN, had a bowel movement this morning that was nonbloody.   Objective: Vital signs in last 24 hours: Temp:  [97.8 F (36.6 C)-99.4 F (37.4 C)] 98.8 F (37.1 C) (07/10 0842) Pulse Rate:  [77-91] 89 (07/10 0700) Resp:  [8-26] 16 (07/10 0842) BP: (117-168)/(55-83) 128/68 (07/10 0700) SpO2:  [91 %-99 %] 98 % (07/10 0842) FiO2 (%):  [0 %] 0 % (07/10 0842) Last BM Date : 12/28/21  Intake/Output from previous day: 07/09 0701 - 07/10 0700 In: 2438.9 [I.V.:2230.8; IV Piggyback:208.1] Out: 1425 [Urine:1400; Drains:25] Intake/Output this shift: Total I/O In: 127.5 [I.V.:109.4; IV Piggyback:18.1] Out: -   PE: Gen: somnolent, no acute distress Card:  Regular rate and rhythm Pulm:  Normal effort on room air Abd: Soft, appropriately tender, drains SS, midline dressing c/d/I Skin: warm and dry, no rashes; LUE PICC, port R chest wall Psych: A&Ox3   Lab Results:  Recent Labs    01/06/22 0510 01/07/22 0430  WBC 5.9 5.7  HGB 10.2* 9.6*  HCT 30.1* 28.4*  PLT 68* 103*   BMET Recent Labs    01/06/22 0510 01/07/22 0430  NA 149* 148*  K 3.7 4.2  CL 117* 117*  CO2 26 26  GLUCOSE 171* 150*  BUN 37* 38*  CREATININE 1.79* 1.50*  CALCIUM 8.7* 8.7*   PT/INR No results for input(s): "LABPROT", "INR" in the last 72 hours. CMP     Component Value Date/Time   NA 148 (H) 01/07/2022 0430   NA 143 02/06/2021 0845   K 4.2 01/07/2022 0430   CL 117 (H) 01/07/2022 0430   CO2 26 01/07/2022 0430   GLUCOSE 150 (H) 01/07/2022 0430   BUN 38 (H) 01/07/2022 0430   BUN 23 02/06/2021 0845   CREATININE 1.50 (H) 01/07/2022 0430   CREATININE 1.57 (H) 12/13/2021 0827   CALCIUM 8.7 (L) 01/07/2022 0430   PROT 5.6 (L) 01/07/2022  0430   PROT 6.4 02/06/2021 0845   ALBUMIN 1.8 (L) 01/07/2022 0430   ALBUMIN 3.9 02/06/2021 0845   AST 30 01/07/2022 0430   AST 18 12/13/2021 0827   ALT 36 01/07/2022 0430   ALT 17 12/13/2021 0827   ALKPHOS 84 01/07/2022 0430   BILITOT 0.7 01/07/2022 0430   BILITOT 0.5 12/13/2021 0827   GFRNONAA 44 (L) 01/07/2022 0430   GFRNONAA 42 (L) 12/13/2021 0827   GFRAA 43 (L) 07/06/2020 1156   Lipase     Component Value Date/Time   LIPASE 35 12/31/2021 0941       Studies/Results: No results found.  Anti-infectives: Anti-infectives (From admission, onward)    Start     Dose/Rate Route Frequency Ordered Stop   01/04/22 1200  piperacillin-tazobactam (ZOSYN) IVPB 3.375 g        3.375 g 12.5 mL/hr over 240 Minutes Intravenous Every 8 hours 01/04/22 0729     01/02/22 1400  piperacillin-tazobactam (ZOSYN) IVPB 2.25 g  Status:  Discontinued        2.25 g 100 mL/hr over 30 Minutes Intravenous Every 8 hours 01/02/22 0718 01/04/22 0728   01/01/22 2000  vancomycin (VANCOCIN) IVPB 1000 mg/200 mL premix  Status:  Discontinued        1,000 mg 200 mL/hr over 60 Minutes Intravenous Every  24 hours 12/31/21 1820 01/01/22 0824   12/31/21 2000  piperacillin-tazobactam (ZOSYN) IVPB 3.375 g  Status:  Discontinued        3.375 g 12.5 mL/hr over 240 Minutes Intravenous Every 8 hours 12/31/21 1821 01/02/22 0718   12/31/21 1130  vancomycin (VANCOREADY) IVPB 1750 mg/350 mL        1,750 mg 175 mL/hr over 120 Minutes Intravenous  Once 12/31/21 1124 12/31/21 1411   12/31/21 1130  ceFEPIme (MAXIPIME) 2 g in sodium chloride 0.9 % 100 mL IVPB        2 g 200 mL/hr over 30 Minutes Intravenous  Once 12/31/21 1124 12/31/21 1226        Assessment/Plan Pneumoperitoneum in the setting of known gastric cancer on chemotherapy POD #7 status post ex lap drainage of intra-abdominal abscess Patient pulled out his NG tube over the weekend.  Tolerating ice chips. Having bowel function.  Upper GI ordered by me today to  rule out gastric leak prior to advancing diet. Continue IV Zosyn Continue TPN Continue twice daily wet-to-dry dressing changes to midline wound AKI improving,  creatinine 1.5 from 1.79 with 1400 cc of urine output Appreciate palliative care following and helping with pain control.  If upper GI negative will be able to start oral analgesics. Stable for transfer to progressive level of care from ICU  Currently on IV amiodarone for history of A-fib started by CCM on 7/4, takes Eliquis at home.    ADDENDUM 1300: patient unable to get UGI because he was unable to safely swallow the PO contrast. Will consult SLP for swallow eval. If they clear him for clear liquids then I would start CLD today and monitor his surgical drains for bilious fluid/enteric contents.   LOS: 7 days   I reviewed nursing notes, Consultant palliative notes, hospitalist notes, last 24 h vitals and pain scores, last 48 h intake and output, last 24 h labs and trends, and last 24 h imaging results.    Obie Dredge, PA-C [facility] Please see Amion for pager number during day hours 7:00am-4:30pm

## 2022-01-07 NOTE — Evaluation (Signed)
Clinical/Bedside Swallow Evaluation Patient Details  Name: Dillon Smith MRN: [id number] Date of Birth: [birth year]  Today's Date: 01/07/2022 Time: SLP Start Time (ACUTE ONLY): 9381 SLP Stop Time (ACUTE ONLY): 1600 SLP Time Calculation (min) (ACUTE ONLY): 40 min  Past Medical History:  Past Medical History:  Diagnosis Date   Bradycardia    CKD (chronic kidney disease), stage III ([place])    Coronary artery disease    CABG 2007   COVID-19 08/2019   Hyperlipidemia LDL goal <70    Hypertension    Persistent atrial fibrillation ([place])    Prostate cancer ([place]) 2010   PVC's (premature ventricular contractions)    Stroke (cerebrum) ([place])    Past Surgical History:  Past Surgical History:  Procedure Laterality Date   COLONOSCOPY  2004   ESOPHAGOGASTRODUODENOSCOPY (EGD) WITH PROPOFOL N/A 11/22/2021   Procedure: ESOPHAGOGASTRODUODENOSCOPY (EGD) WITH PROPOFOL;  Surgeon: Carol Ada, MD;  Location: [facility];  Service: Gastroenterology;  Laterality: N/A;   EUS N/A 11/22/2021   Procedure: UPPER ENDOSCOPIC ULTRASOUND (EUS) RADIAL;  Surgeon: Carol Ada, MD;  Location: [facility];  Service: Gastroenterology;  Laterality: N/A;   INSERTION PROSTATE RADIATION SEED  2010   IR IMAGING GUIDED PORT INSERTION  11/28/2021   LAPAROTOMY N/A 12/31/2021   Procedure: EXPLORATORY LAPAROTOMY;  Surgeon: Jovita Kussmaul, MD;  Location: [facility];  Service: General;  Laterality: N/A;  DRAINAGE OF INTRAABDOMINAL ABSCESS   stent     HPI:  88yo male admitted 12/31/21 with abdominal pain. PMH: HTN, prostate cancer, HLD, AFib, CKD3, CVA, CAD s/p CABG, current gastric adenocarcinoma undergoing chemotherapy.    Assessment / Plan / Recommendation  Clinical Impression  Pt seen at bedside for evaluation of swallow function and safety. Per PA order, PO trials were limited to clear liquids only. Pt is edentulous. He has dentures at home. CN exam unremarkable. Pt occasionally elicits snoring sounds when breathing while awake.  He also exhibits intermittent throat clearing, which his daughter indicates is baseline for him. Pt accepted trials of ice chips and thin liquids via cup and straw. No voice quality change or overt s/s aspiration, even when taking 3oz in succession. Pt did demonstrate intermittent belching, raising concern for esophageal dysmotility. Will begin clear liquids and follow for diet tolerance. Safe swallow precautions posted at [facility]. RN and PA aware.  SLP Visit Diagnosis: Dysphagia, unspecified (R13.10)    Aspiration Risk  Mild aspiration risk    Diet Recommendation Other (Comment);Thin liquid (clear liquid)   Liquid Administration via: Cup;Straw Supervision: Patient able to self feed Compensations: Minimize environmental distractions;Slow rate;Small sips/bites Postural Changes: Seated upright at 90 degrees;Remain upright for at least 30 minutes after po intake    Other  Recommendations Oral Care Recommendations: Oral care BID Other Recommendations: Have oral suction available    Recommendations for follow up therapy are one component of a multi-disciplinary discharge planning process, led by the attending physician.  Recommendations may be updated based on patient status, additional functional criteria and insurance authorization.  Follow up Recommendations Follow physician's recommendations for discharge plan and follow up therapies      Assistance Recommended at Discharge Intermittent Supervision/Assistance  Functional Status Assessment Patient has had a recent decline in their functional status and demonstrates the ability to make significant improvements in function in a reasonable and predictable amount of time.  Frequency and Duration min 1 x/week  1 week;2 weeks       Prognosis Prognosis for Safe Diet Advancement: Good      Swallow  Study   General Date of Onset: 12/31/21 HPI: 88yo male admitted 12/31/21 with abdominal pain. PMH: HTN, prostate cancer, HLD, AFib, CKD3, CVA, CAD s/p  CABG, current gastric adenocarcinoma undergoing chemotherapy. Type of Study: Bedside Swallow Evaluation Previous Swallow Assessment: none found Diet Prior to this Study: NPO Temperature Spikes Noted: No Respiratory Status: Room air History of Recent Intubation: Yes Length of Intubations (days): 1 days (during surgery) Date extubated: 12/31/21 Behavior/Cognition: Alert;Cooperative;Pleasant mood Oral Cavity Assessment: Within Functional Limits Oral Care Completed by SLP: Yes Oral Cavity - Dentition: Edentulous Vision: Functional for self-feeding Self-Feeding Abilities: Needs assist Patient Positioning: Upright in bed Baseline Vocal Quality: Low vocal intensity Volitional Cough: Weak Volitional Swallow: Able to elicit    Oral/Motor/Sensory Function Overall Oral Motor/Sensory Function: Within functional limits   Ice Chips Ice chips: Within functional limits Presentation: Spoon   Thin Liquid Thin Liquid: Within functional limits Presentation: Cup;Straw    Nectar Thick Nectar Thick Liquid: Not tested   Honey Thick Honey Thick Liquid: Not tested   Puree Puree: Not tested   Solid     Solid: Not tested     Leonard Feigel B. Quentin Ore, [facility] [place] Speech Language Pathologist Office: [phone]  Shonna Chock 01/07/2022,4:13 PM

## 2022-01-07 NOTE — Progress Notes (Signed)
PHARMACY - TOTAL PARENTERAL NUTRITION CONSULT NOTE   Indication: Prolonged ileus  Patient Measurements: Height: '5\' 11"'$  (180.3 cm) Weight: 89.6 kg (197 lb 8.5 oz) IBW/kg (Calculated) : 75.3 TPN AdjBW (KG): 84.7 Body mass index is 27.55 kg/m.  Assessment: 88 yo male with perforated gastric cancer on chemotherapy s/p ex lap with intraabdominal abscess drainage 7/3. PMH includes Afib on eliquis, CKDIII, HTN, prostate cancer, CVA, and HLD. Pharmacy consulted to start TPN for possible prolonged ileus.  Glucose / Insulin: CBGs near goal of < 150 - 10 units SSI used in past 24hr Electrolytes: Na/Cl and Corr Ca 10.46 elevated  Renal: AKI on CKDIII. SCr elevated but but improving. UOP 1461m/24hr  Hepatic: AST/ALT improved to WNL, Tbili WNL, Alb low Triglycerides WNL (7/10) Intake / Output: NGT pulled out by patient, drains 220mMIVF[facility] GI Imaging: 7/3 CT intra-abdominal free air which was concerning for bowel perforation GI Surgeries / Procedures:  7/3 ex lap, drainage of intraabdominal abscess  Central access: PICC 7/7, Implanted port TPN start date: 7/7  Nutritional Goals: Goal TPN rate is 90 mL/hr (provides 119 g of protein and 2372 kcals per day)  RD Assessment: Estimated Needs Total Energy Estimated Needs: 2300-2500 kcal Total Protein Estimated Needs: 110-125 grams Total Fluid Estimated Needs: >/= 2.3 L/day  Current Nutrition:  NPO except ice chips  Plan:  Continue TPN at goal 9032mr at 1800 Electrolytes in TPN:  Na 0 mEq/L (decrease) K 88m69m Ca 0 mEq/L (decrease) Mg 0mEq65mPhos 15mmo55mCl:Ac max acetate Add standard MVI and trace elements to TPN Continue Sensitive q4h SSI and adjust as needed  Monitor TPN labs on Mon/Thurs  ChristGretta ArabD, BCPS Clinical Pharmacist [facility] main pharmacy [phone] 7:43 AM

## 2022-01-07 NOTE — TOC Progression Note (Signed)
Transition of Care [facility]) - Progression Note    Patient Details  Name: DANDRA VELARDI MRN: [id number] Date of Birth: [birth year]  [facility] CM/SW Contact  Ross Ludwig, [place] Phone Number: 01/07/2022, 5:42 PM  Clinical Narrative:     TOC continuing to follow patient's progress throughout discharge planning.  Plan is to return home once medically ready for discharge.  Expected Discharge Plan: Home/Self Care Barriers to Discharge: Continued Medical Work up  Expected Discharge Plan and Services Expected Discharge Plan: Home/Self Care   Discharge Planning Services: CM Consult   Living arrangements for the past 2 months: Single Family Home                                       Social Determinants of Health (SDOH) Interventions    Readmission Risk Interventions     No data to display

## 2022-01-08 ENCOUNTER — Other Ambulatory Visit: Payer: Self-pay

## 2022-01-08 DIAGNOSIS — N1831 Chronic kidney disease, stage 3a: Secondary | ICD-10-CM

## 2022-01-08 DIAGNOSIS — I10 Essential (primary) hypertension: Secondary | ICD-10-CM

## 2022-01-08 DIAGNOSIS — Z7189 Other specified counseling: Secondary | ICD-10-CM

## 2022-01-08 DIAGNOSIS — I2510 Atherosclerotic heart disease of native coronary artery without angina pectoris: Secondary | ICD-10-CM

## 2022-01-08 DIAGNOSIS — I5023 Acute on chronic systolic (congestive) heart failure: Secondary | ICD-10-CM | POA: Diagnosis not present

## 2022-01-08 DIAGNOSIS — I4811 Longstanding persistent atrial fibrillation: Secondary | ICD-10-CM | POA: Diagnosis not present

## 2022-01-08 DIAGNOSIS — R52 Pain, unspecified: Secondary | ICD-10-CM | POA: Diagnosis not present

## 2022-01-08 DIAGNOSIS — E782 Mixed hyperlipidemia: Secondary | ICD-10-CM

## 2022-01-08 DIAGNOSIS — C163 Malignant neoplasm of pyloric antrum: Secondary | ICD-10-CM

## 2022-01-08 DIAGNOSIS — Z515 Encounter for palliative care: Secondary | ICD-10-CM | POA: Diagnosis not present

## 2022-01-08 DIAGNOSIS — K255 Chronic or unspecified gastric ulcer with perforation: Secondary | ICD-10-CM | POA: Diagnosis not present

## 2022-01-08 LAB — BASIC METABOLIC PANEL
Anion gap: 7 (ref 5–15)
BUN: 38 mg/dL — ABNORMAL HIGH (ref 8–23)
CO2: 26 mmol/L (ref 22–32)
Calcium: 8.4 mg/dL — ABNORMAL LOW (ref 8.9–10.3)
Chloride: 114 mmol/L — ABNORMAL HIGH (ref 98–111)
Creatinine, Ser: 1.6 mg/dL — ABNORMAL HIGH (ref 0.61–1.24)
GFR, Estimated: 41 mL/min — ABNORMAL LOW (ref >60)
Glucose, Bld: 156 mg/dL — ABNORMAL HIGH (ref 70–99)
Potassium: 4.3 mmol/L (ref 3.5–5.1)
Sodium: 147 mmol/L — ABNORMAL HIGH (ref 135–145)

## 2022-01-08 LAB — MAGNESIUM: Magnesium: 2 mg/dL (ref 1.7–2.4)

## 2022-01-08 LAB — GLUCOSE, CAPILLARY
Glucose-Capillary: 144 mg/dL — ABNORMAL HIGH (ref 70–99)
Glucose-Capillary: 146 mg/dL — ABNORMAL HIGH (ref 70–99)
Glucose-Capillary: 152 mg/dL — ABNORMAL HIGH (ref 70–99)
Glucose-Capillary: 171 mg/dL — ABNORMAL HIGH (ref 70–99)
Glucose-Capillary: 177 mg/dL — ABNORMAL HIGH (ref 70–99)
Glucose-Capillary: 178 mg/dL — ABNORMAL HIGH (ref 70–99)

## 2022-01-08 LAB — HEPARIN LEVEL (UNFRACTIONATED): Heparin Unfractionated: <0.1 IU/mL — ABNORMAL LOW (ref 0.30–0.70)

## 2022-01-08 LAB — PHOSPHORUS: Phosphorus: 3 mg/dL (ref 2.5–4.6)

## 2022-01-08 MED ORDER — HEPARIN (PORCINE) 25000 UT/250ML-% IV SOLN
1500.0000 [IU]/h | INTRAVENOUS | Status: DC
  Administered 2022-01-08: 1250 [IU]/h via INTRAVENOUS
  Administered 2022-01-09: 1500 [IU]/h via INTRAVENOUS
  Filled 2022-01-08 (×2): qty 250

## 2022-01-08 MED ORDER — TRAVASOL 10 % IV SOLN
INTRAVENOUS | Status: AC
  Filled 2022-01-08: qty 1188

## 2022-01-08 MED ORDER — HYDROMORPHONE HCL 1 MG/ML PO LIQD
2.0000 mg | ORAL | Status: DC | PRN
  Filled 2022-01-08: qty 2

## 2022-01-08 MED ORDER — FUROSEMIDE 10 MG/ML IJ SOLN
40.0000 mg | Freq: Once | INTRAMUSCULAR | Status: AC
  Administered 2022-01-08: 40 mg via INTRAVENOUS
  Filled 2022-01-08: qty 4

## 2022-01-08 MED ORDER — HYDROMORPHONE HCL 1 MG/ML IJ SOLN: 1.0000 mg | INTRAMUSCULAR | Status: DC | PRN

## 2022-01-08 NOTE — Progress Notes (Signed)
Ref to outpatient palliative placed Per Dr.Anwar

## 2022-01-08 NOTE — Progress Notes (Signed)
ANTICOAGULATION CONSULT NOTE - Initial Consult  Pharmacy Consult for Heparin Indication: h/o afib and CVA  Allergies  Allergen Reactions   Penicillins Itching    Patient Measurements: Height: '5\' 11"'$  (180.3 cm) Weight: 89.6 kg (197 lb 8.5 oz) IBW/kg (Calculated) : 75.3 Heparin Dosing Weight: 89.6 kg  Vital Signs: Temp: 99.4 F (37.4 C) (07/11 0623) Temp Source: Oral (07/11 0623) BP: 186/72 (07/11 0623) Pulse Rate: 93 (07/11 0623)  Labs: Recent Labs    01/06/22 0510 01/07/22 0430 01/08/22 0324  HGB 10.2* 9.6*  --   HCT 30.1* 28.4*  --   PLT 68* 103*  --   CREATININE 1.79* 1.50* 1.60*    Estimated Creatinine Clearance: 32.7 mL/min (A) (by C-G formula based on SCr of 1.6 mg/dL (H)).   Medical History: Past Medical History:  Diagnosis Date   Bradycardia    CKD (chronic kidney disease), stage III ([place])    Coronary artery disease    CABG 2007   COVID-19 08/2019   Hyperlipidemia LDL goal <70    Hypertension    Persistent atrial fibrillation ([place])    Prostate cancer ([place]) 2010   PVC's (premature ventricular contractions)    Stroke (cerebrum) (H[facility])     Assessment: AC/Heme: h/o afib and CVA. CHADS2VASC at least 8 (?DM with last A1C 6.3 in 2021) -Holding PTA Eliquis -Hgb was trending down but now stable. Plts up to 103. - 7/11 Start IV heparin (no bolus)  Goal of Therapy:  Heparin level 0.3-0.7 units/ml Monitor platelets by anticoagulation protocol: Yes   Plan:  Start IV heparin (no bolus) at 1250 units/hr Check heparin level in 6 hrs. Daily HL and CBC   Naiomy Watters S. Alford Highland, PharmD, BCPS Clinical Staff Pharmacist [email or web address] Alford Highland, [place] 01/08/2022,11:00 AM

## 2022-01-08 NOTE — Progress Notes (Signed)
Daily Progress Note   Patient Name: Dillon Smith       Date: 01/08/2022 DOB: [birth year]  Age: 88 y.o. MRN#: [id number] Attending Physician: Nolon Nations, MD Primary Care Physician: [name] Admit Date: 12/31/2021  Reason for Consultation/Follow-up: Establishing goals of care  Subjective: Awake alert, resting in bed, daughter [name] has arrived from the [facility] area to be with the patient and is at bedside.   Length of Stay: 8  Current Medications: Scheduled Meds:   Chlorhexidine Gluconate Cloth  6 each Topical Daily   diphenhydrAMINE  25 mg Intravenous QHS   fentaNYL  1 patch Transdermal Q72H   insulin aspart  0-9 Units Subcutaneous Q4H   pantoprazole (PROTONIX) IV  40 mg Intravenous QHS   sodium chloride flush  10-40 mL Intracatheter Q12H   thiamine injection  100 mg Intravenous Daily    Continuous Infusions:  sodium chloride Stopped (01/04/22 1748)   sodium chloride Stopped (01/05/22 1124)   amiodarone 30 mg/hr (01/08/22 1124)   heparin     TPN ADULT (ION) 90 mL/hr at 01/08/22 0355   TPN ADULT (ION)      PRN Meds: sodium chloride, bisacodyl, haloperidol lactate, heparin lock flush, HYDROmorphone (DILAUDID) injection, HYDROmorphone HCl, lip balm, ondansetron **OR** ondansetron (ZOFRAN) IV, mouth rinse, phenol, sodium chloride flush  Physical Exam         Awakens easily No distress Regular work of breathing S 1 S 2  Abdomen has drain No edema Has port  Vital Signs: BP (!) 186/72 (BP Location: Right Arm)   Pulse 93   Temp 99.4 F (37.4 C) (Oral)   Resp 16   Ht '5\' 11"'$  (1.803 m)   Wt 89.6 kg   SpO2 94%   BMI 27.55 kg/m  SpO2: SpO2: 94 % O2 Device: O2 Device: Room Air O2 Flow Rate: O2 Flow Rate (L/min): 0 L/min  Intake/output summary:  Intake/Output  Summary (Last 24 hours) at 01/08/2022 1157 Last data filed at 01/08/2022 0600 Gross per 24 hour  Intake 1285.28 ml  Output 1025 ml  Net 260.28 ml   LBM: Last BM Date : 01/07/22 Baseline Weight: Weight: 84.7 kg Most recent weight: Weight: 89.6 kg       Palliative Assessment/Data:      Patient Active Problem List   Diagnosis Date Noted  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[place]) 12/31/2021   Port-A-Cath in place 12/13/2021   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 08/19/2019   Symptomatic bradycardia 08/19/2019   Bradycardia 08/18/2019   COVID-19 virus infection 08/17/2019   Atrial fibrillation ([place]) 08/17/2019   Hyperlipidemia 08/17/2019   Chronic kidney disease (CKD) stage G3b/A2, moderately decreased glomerular filtration rate (GFR) between 30-44 mL/min/1.73 square meter and albuminuria creatinine ratio between 30-299 mg/g (HCC) 08/17/2019   Acute ischemic stroke (HCC) mult R MCA and PCA embolic infarcts d/t AF not on AC s/p tPA 08/15/2019   CARCINOMA, PROSTATE 09/15/2007   Essential hypertension 09/15/2007   Coronary artery disease 09/15/2007   HEMORRHOIDS, INTERNAL 09/15/2007   RADIATION PROCTITIS 09/15/2007    Palliative Care Assessment & Plan   Patient Profile:    Assessment: 88 year old gentleman admitted with pneumoperitoneum in the setting of known gastric cancer, status post ex lap drainage of intra-abdominal abscess, palliative care following for assistance with pain management.  Discussion/recommendations/Plan: Patient seen and examined.  Discussed with daughter [name] who was present at bedside.  Discussed about pain management.  Appreciate bedside RN assistance.  PCA will be discontinued.  Oral hydromorphone solution will be started for pain.  Continue current strength fentanyl patch and monitor.  Outpatient follow-up for palliative care at the [facility] has been arranged for, nurse [place]  notified.  Goals of Care and Additional Recommendations: Limitations on Scope of Treatment: Full Scope Treatment  Code Status:    Code Status Orders  (From admission, onward)           Start     Ordered   12/31/21 2012  Full code  Continuous        12/31/21 2011           Code Status History     Date Active Date Inactive Code Status Order ID Comments User Context   12/31/2021 1440 12/31/2021 2012 DNR [id number]  Garald Balding, PA-C ED   08/19/2019 0131 08/21/2019 1831 Full Code [id number]  Vianne Bulls, MD ED   08/15/2019 2100 08/17/2019 2210 Full Code [id number]  Amie Portland, MD ED      Advance Directive Documentation    [place] Most Recent Value  Type of Advance Directive Living will  Pre-existing out of facility DNR order (yellow form or pink MOST form) --  "MOST" Form in Place? --       Prognosis:  Guarded   Discharge Planning: To Be Determined  Care plan was discussed with patient and daughter [name] who is at bedside.   Thank you for allowing the Palliative Medicine Team to assist in the care of this patient.    MOD MDM.     Greater than 50%  of this time was spent counseling and coordinating care related to the above assessment and plan.  Loistine Chance, MD  Please contact Palliative Medicine Team phone at [phone] for questions and concerns.

## 2022-01-08 NOTE — Progress Notes (Addendum)
[facility] Progress Note  8 Days Post-Op  Subjective: CC:  Cc abd soreness. Tolerating sips of water and ginger ale. Had a BM yesterday.   Objective: Vital signs in last 24 hours: Temp:  [98.2 F (36.8 C)-99.4 F (37.4 C)] 99.4 F (37.4 C) (07/11 0623) Pulse Rate:  [83-93] 93 (07/11 0623) Resp:  [16-20] 16 (07/11 0900) BP: (105-186)/(62-78) 186/72 (07/11 0623) SpO2:  [91 %-100 %] 94 % (07/11 0900) FiO2 (%):  [0 %] 0 % (07/10 1118) Last BM Date : 01/07/22  Intake/Output from previous day: 07/10 0701 - 07/11 0700 In: 1626.1 [I.V.:1508; IV Piggyback:118.1] Out: 1025 [Urine:1000; Drains:25] Intake/Output this shift: No intake/output data recorded.  PE: Gen: somnolent, no acute distress Card:  Regular rate and rhythm on amio gtt Pulm:  Normal effort on room air Abd: Soft, appropriately tender, drains SS, midline wound pink and moist fascia in tact Skin: warm and dry, no rashes; LUE PICC, port R chest wall Psych: A&Ox3   Lab Results:  Recent Labs    01/06/22 0510 01/07/22 0430  WBC 5.9 5.7  HGB 10.2* 9.6*  HCT 30.1* 28.4*  PLT 68* 103*   BMET Recent Labs    01/07/22 0430 01/08/22 0324  NA 148* 147*  K 4.2 4.3  CL 117* 114*  CO2 26 26  GLUCOSE 150* 156*  BUN 38* 38*  CREATININE 1.50* 1.60*  CALCIUM 8.7* 8.4*   PT/INR No results for input(s): "LABPROT", "INR" in the last 72 hours. CMP     Component Value Date/Time   NA 147 (H) 01/08/2022 0324   NA 143 02/06/2021 0845   K 4.3 01/08/2022 0324   CL 114 (H) 01/08/2022 0324   CO2 26 01/08/2022 0324   GLUCOSE 156 (H) 01/08/2022 0324   BUN 38 (H) 01/08/2022 0324   BUN 23 02/06/2021 0845   CREATININE 1.60 (H) 01/08/2022 0324   CREATININE 1.57 (H) 12/13/2021 0827   CALCIUM 8.4 (L) 01/08/2022 0324   PROT 5.6 (L) 01/07/2022 0430   PROT 6.4 02/06/2021 0845   ALBUMIN 1.8 (L) 01/07/2022 0430   ALBUMIN 3.9 02/06/2021 0845   AST 30 01/07/2022 0430   AST 18 12/13/2021 0827   ALT 36 01/07/2022  0430   ALT 17 12/13/2021 0827   ALKPHOS 84 01/07/2022 0430   BILITOT 0.7 01/07/2022 0430   BILITOT 0.5 12/13/2021 0827   GFRNONAA 41 (L) 01/08/2022 0324   GFRNONAA 42 (L) 12/13/2021 0827   GFRAA 43 (L) 07/06/2020 1156   Lipase     Component Value Date/Time   LIPASE 35 12/31/2021 0941       Studies/Results: DG UGI W SINGLE CM (SOL OR THIN BA)  Result Date: 01/07/2022 CLINICAL DATA:  Pneumoperitoneum in the setting of known gastric cancer on chemotherapy. Status post ex lap drainage of intra-abdominal abscess. EXAM: UPPER GI SERIES WITH KUB TECHNIQUE: A water-soluble contrast upper GI series was attempted. The examination was attempted by Rowe Robert, PA-C, and was supervised and interpreted by Dr. Kellie Simmering. FLUOROSCOPY: Fluoroscopy time: 42 seconds (12.9 mGy). COMPARISON:  CTA abdomen/pelvis 12/31/2021. FINDINGS: Prior to the procedure, a scout radiograph of the abdomen was obtained. Bilateral surgical drains. Gastroduodenal bypass device. No dilated loops of small bowel. Gaseous distention of the colon. Degenerative changes of the spine and bilateral femoroacetabular joints. Subsequently, a water-soluble contrast upper GI series was attempted. However, the patient was unable to swallow the contrast and the examination was terminated. IMPRESSION: Attempted water-soluble contrast upper GI series, as described.  The patient was unable to swallow the contrast, and the examination was terminated. Electronically Signed   By: Kellie Simmering D.O.   On: 01/07/2022 12:57    Anti-infectives: Anti-infectives (From admission, onward)    Start     Dose/Rate Route Frequency Ordered Stop   01/04/22 1200  piperacillin-tazobactam (ZOSYN) IVPB 3.375 g        3.375 g 12.5 mL/hr over 240 Minutes Intravenous Every 8 hours 01/04/22 0729     01/02/22 1400  piperacillin-tazobactam (ZOSYN) IVPB 2.25 g  Status:  Discontinued        2.25 g 100 mL/hr over 30 Minutes Intravenous Every 8 hours 01/02/22 0718  01/04/22 0728   01/01/22 2000  vancomycin (VANCOCIN) IVPB 1000 mg/200 mL premix  Status:  Discontinued        1,000 mg 200 mL/hr over 60 Minutes Intravenous Every 24 hours 12/31/21 1820 01/01/22 0824   12/31/21 2000  piperacillin-tazobactam (ZOSYN) IVPB 3.375 g  Status:  Discontinued        3.375 g 12.5 mL/hr over 240 Minutes Intravenous Every 8 hours 12/31/21 1821 01/02/22 0718   12/31/21 1130  vancomycin (VANCOREADY) IVPB 1750 mg/350 mL        1,750 mg 175 mL/hr over 120 Minutes Intravenous  Once 12/31/21 1124 12/31/21 1411   12/31/21 1130  ceFEPIme (MAXIPIME) 2 g in sodium chloride 0.9 % 100 mL IVPB        2 g 200 mL/hr over 30 Minutes Intravenous  Once 12/31/21 1124 12/31/21 1226        Assessment/Plan Pneumoperitoneum in the setting of known gastric cancer on chemotherapy POD #8 status post ex lap drainage of intra-abdominal abscess Pulled out his NG 7/9, unable to get UGI due to inability to swallow all of the contrast. PO trial on 7/10 after cleared by SLP and he is tolerating sips of clears (water/ginger ale) without signs of enteric leak. Continue liquid diet today. Ok to advance to fulls if cleared by SLP. Contiue TPN Stop zosyn today - no evidence of intestinal leak, WBC WNL, afebrile.  Continue twice daily wet-to-dry dressing changes to midline wound   Appreciate palliative care following and helping with pain control.  [facility] consulting today for assistance with chronic medical conditions - AKI on CKD III, HTN, HLD Cardiology consult today for assistance with afib - currently on amio gtt not on rate control med at home. Resume hep gtt today no bolus.  FEN: CLD, TPN ID: Zosyn 7/3-7/11 VTE: SCD's, hep gtt ordered to start today for hx afib  Foley: external cath Dispo: progressive care, PT/OT, adv diet   LOS: 8 days   I reviewed nursing notes, Consultant palliative notes, hospitalist notes, last 24 h vitals and pain scores, last 48 h intake and output, last 24 h labs  and trends, and last 24 h imaging results.    Obie Dredge, PA-C [facility] Please see Amion for pager number during day hours 7:00am-4:30pm

## 2022-01-08 NOTE — Care Management Important Message (Signed)
Important Message  Patient Details IM Letter placed in Patients room. Name: Dillon Smith MRN: [id number] Date of Birth: Sep 10, 1929   Medicare Important Message Given:  Yes     Kerin Salen 01/08/2022, 11:50 AM

## 2022-01-08 NOTE — Progress Notes (Signed)
Fentanyl PCA discontinued. 9.5 mls wasted, with Rise Paganini RN

## 2022-01-08 NOTE — Consult Note (Addendum)
CONSULTATION NOTE   Patient Name: Dillon Smith Date of Encounter: 01/08/2022 Cardiologist: Lauree Chandler, MD Electrophysiologist: Thompson Grayer, MD Advanced Heart Failure: None   Chief Complaint   AFib  Patient Profile   88 yo male admitted with abdominal pain, found to have pneumoperitoneum with recently diagnosed gastric cancer, now in afib  HPI   Dillon Smith is a 88 y.o. male who is being seen today for the evaluation of afib at the request of Dr. Brantley Stage . This is a pleasant 88 year old male with gastric cancer, admitted with abdominal pain and found to have pneumoperitoneum associated with that.  She is now postoperative day #8 status post ex lap drainage of intra-abdominal abscess.  Cardiology was asked to consult today for assistance with A-fib.  She is on an amiodarone infusion and no rate controlling medicine at home.  Heparin was restarted today.  She has a history of atrial fibrillation on Eliquis - previously 5 mg BID. Not clear from the notes as to why amiodarone was started by PCCM. Echo on 01/03/2022 shows LVEF 25-30% (which is reduced from 45-50% back in 2021) with global hypokinesis and grade 2 DD.  Right ventricular function is mildly reduced.  RVSP was 40.5 mmHg.  The left atrium was severely dilated and the right atrium was moderately dilated.  There is was reportedly severe aortic regurgitation, but it appears more mild to moderate to my review.  Dillon Smith is currently without complaints.  Is noted to notably somnolent and according to his daughter seems to be in and out over the past week.  Dillon Smith is being followed by palliative medicine.  Dillon Smith is listed surprisingly as FULL CODE here but his daughter said that Dillon Smith has DNR paperwork.  PMHx   Past Medical History:  Diagnosis Date   Bradycardia    CKD (chronic kidney disease), stage III ([place])    Coronary artery disease    CABG 2007   COVID-19 08/2019   Hyperlipidemia LDL goal <70    Hypertension    Persistent  atrial fibrillation ([place])    Prostate cancer ([place]) 2010   PVC's (premature ventricular contractions)    Stroke (cerebrum) ([place])     Past Surgical History:  Procedure Laterality Date   COLONOSCOPY  2004   ESOPHAGOGASTRODUODENOSCOPY (EGD) WITH PROPOFOL N/A 11/22/2021   Procedure: ESOPHAGOGASTRODUODENOSCOPY (EGD) WITH PROPOFOL;  Surgeon: Carol Ada, MD;  Location: [facility];  Service: Gastroenterology;  Laterality: N/A;   EUS N/A 11/22/2021   Procedure: UPPER ENDOSCOPIC ULTRASOUND (EUS) RADIAL;  Surgeon: Carol Ada, MD;  Location: [facility];  Service: Gastroenterology;  Laterality: N/A;   INSERTION PROSTATE RADIATION SEED  2010   IR IMAGING GUIDED PORT INSERTION  11/28/2021   LAPAROTOMY N/A 12/31/2021   Procedure: EXPLORATORY LAPAROTOMY;  Surgeon: Jovita Kussmaul, MD;  Location: [facility];  Service: General;  Laterality: N/A;  DRAINAGE OF INTRAABDOMINAL ABSCESS   stent      FAMHx   Family History  Problem Relation Age of Onset   Cancer Neg Hx     SOCHx    reports that Dillon Smith has quit smoking. Dillon Smith has never used smokeless tobacco. Dillon Smith reports that Dillon Smith does not currently use alcohol. Dillon Smith reports that Dillon Smith does not currently use drugs.  Outpatient Medications   No current facility-administered medications on file prior to encounter.   Current Outpatient Medications on File Prior to Encounter  Medication Sig Dispense Refill   amLODipine (NORVASC) 10 MG tablet Take 10 mg by mouth daily.  apixaban (ELIQUIS) 5 MG TABS tablet Take 2.5 mg by mouth 2 (two) times daily.     atorvastatin (LIPITOR) 40 MG tablet Take 1 tablet (40 mg total) by mouth at bedtime. 30 tablet 2   glycerin adult 2 g suppository Place 1 suppository rectally as needed for constipation. 12 suppository 0   lidocaine-prilocaine (EMLA) cream Apply to affected area once (Patient taking differently: Apply 1 Application topically once.) 30 g 3   Multiple Vitamin (MULTI VITAMIN DAILY PO) Take 0.5 tablets by mouth daily.      capecitabine (XELODA) 500 MG tablet Take by mouth 2 (two) times daily after a meal. (Patient not taking: Reported on 01/01/2022)     metoprolol tartrate (LOPRESSOR) 50 MG tablet Take 50 mg by mouth 2 (two) times daily. (Patient not taking: Reported on 01/01/2022)     ondansetron (ZOFRAN) 8 MG tablet Take 1 tablet (8 mg total) by mouth 2 (two) times daily as needed for refractory nausea / vomiting. Start on day 3 after chemotherapy. (Patient not taking: Reported on 01/01/2022) 30 tablet 1   prochlorperazine (COMPAZINE) 10 MG tablet Take 1 tablet (10 mg total) by mouth every 6 (six) hours as needed (Nausea or vomiting). (Patient not taking: Reported on 01/01/2022) 30 tablet 1    Inpatient Medications    Scheduled Meds:  Chlorhexidine Gluconate Cloth  6 each Topical Daily   diphenhydrAMINE  25 mg Intravenous QHS   fentaNYL  1 patch Transdermal Q72H   insulin aspart  0-9 Units Subcutaneous Q4H   pantoprazole (PROTONIX) IV  40 mg Intravenous QHS   sodium chloride flush  10-40 mL Intracatheter Q12H   thiamine injection  100 mg Intravenous Daily    Continuous Infusions:  sodium chloride Stopped (01/04/22 1748)   sodium chloride Stopped (01/05/22 1124)   amiodarone 30 mg/hr (01/08/22 1124)   heparin     TPN ADULT (ION) 90 mL/hr at 01/08/22 0355   TPN ADULT (ION)      PRN Meds: sodium chloride, bisacodyl, haloperidol lactate, heparin lock flush, HYDROmorphone (DILAUDID) injection, HYDROmorphone HCl, lip balm, ondansetron **OR** ondansetron (ZOFRAN) IV, mouth rinse, phenol, sodium chloride flush   ALLERGIES   Allergies  Allergen Reactions   Penicillins Itching    ROS   Review of systems not obtained due to patient factors.  Vitals   Vitals:   01/08/22 0019 01/08/22 0409 01/08/22 0623 01/08/22 0900  BP: 134/69  (!) 186/72   Pulse: 91  93   Resp:  16  16  Temp: 99.1 F (37.3 C)  99.4 F (37.4 C)   TempSrc: Oral  Oral   SpO2: 99% 97% 96% 94%  Weight:      Height:         Intake/Output Summary (Last 24 hours) at 01/08/2022 1220 Last data filed at 01/08/2022 0600 Gross per 24 hour  Intake 1285.28 ml  Output 1025 ml  Net 260.28 ml   Filed Weights   12/31/21 2015 01/04/22 0908  Weight: 84.7 kg 89.6 kg    Physical Exam   General appearance: somnolent, eyes open intermittently, does not follow commands Neck: JVD - a few cm above sternal notch and thyroid not enlarged, symmetric, no tenderness/mass/nodules Lungs: diminished breath sounds bibasilar Heart: irregularly irregular rhythm Abdomen: post-surgical Extremities: edema 1+ bilateral Pulses: 2+ and symmetric Skin: Skin color, texture, turgor normal. No rashes or lesions Neurologic: Mental status: somnolent Psych: Cannot assess  Labs   Results for orders placed or performed during the hospital encounter of  12/31/21 (from the past 48 hour(s))  Glucose, capillary     Status: Abnormal   Collection Time: 01/06/22  3:49 PM  Result Value Ref Range   Glucose-Capillary 143 (H) 70 - 99 mg/dL    Comment: Glucose reference range applies only to samples taken after fasting for at least 8 hours.  Glucose, capillary     Status: Abnormal   Collection Time: 01/06/22  7:30 PM  Result Value Ref Range   Glucose-Capillary 160 (H) 70 - 99 mg/dL    Comment: Glucose reference range applies only to samples taken after fasting for at least 8 hours.  Glucose, capillary     Status: Abnormal   Collection Time: 01/06/22 11:42 PM  Result Value Ref Range   Glucose-Capillary 151 (H) 70 - 99 mg/dL    Comment: Glucose reference range applies only to samples taken after fasting for at least 8 hours.  Glucose, capillary     Status: Abnormal   Collection Time: 01/07/22  4:17 AM  Result Value Ref Range   Glucose-Capillary 136 (H) 70 - 99 mg/dL    Comment: Glucose reference range applies only to samples taken after fasting for at least 8 hours.  CBC     Status: Abnormal   Collection Time: 01/07/22  4:30 AM  Result Value  Ref Range   WBC 5.7 4.0 - 10.5 K/uL   RBC 3.11 (L) 4.22 - 5.81 MIL/uL   Hemoglobin 9.6 (L) 13.0 - 17.0 g/dL   HCT 28.4 (L) 39.0 - 52.0 %   MCV 91.3 80.0 - 100.0 fL   MCH 30.9 26.0 - 34.0 pg   MCHC 33.8 30.0 - 36.0 g/dL   RDW 14.9 11.5 - 15.5 %   Platelets 103 (L) 150 - 400 K/uL   nRBC 0.0 0.0 - 0.2 %    Comment: Performed at [facility], [place] [address] [place], [place] [address]  Comprehensive metabolic panel     Status: Abnormal   Collection Time: 01/07/22  4:30 AM  Result Value Ref Range   Sodium 148 (H) 135 - 145 mmol/L   Potassium 4.2 3.5 - 5.1 mmol/L   Chloride 117 (H) 98 - 111 mmol/L   CO2 26 22 - 32 mmol/L   Glucose, Bld 150 (H) 70 - 99 mg/dL    Comment: Glucose reference range applies only to samples taken after fasting for at least 8 hours.   BUN 38 (H) 8 - 23 mg/dL   Creatinine, Ser 1.50 (H) 0.61 - 1.24 mg/dL   Calcium 8.7 (L) 8.9 - 10.3 mg/dL   Total Protein 5.6 (L) 6.5 - 8.1 g/dL   Albumin 1.8 (L) 3.5 - 5.0 g/dL   AST 30 15 - 41 U/L   ALT 36 0 - 44 U/L   Alkaline Phosphatase 84 38 - 126 U/L   Total Bilirubin 0.7 0.3 - 1.2 mg/dL   GFR, Estimated 44 (L) >60 mL/min    Comment: (NOTE) Calculated using the CKD-EPI Creatinine Equation (2021)    Anion gap 5 5 - 15    Comment: Performed at [facility], [place] [address] [place], [place] [address]  Magnesium     Status: None   Collection Time: 01/07/22  4:30 AM  Result Value Ref Range   Magnesium 2.1 1.7 - 2.4 mg/dL    Comment: Performed at [facility], [place] [address], [place],  [address]  Phosphorus     Status: None   Collection Time: 01/07/22  4:30 AM  Result Value Ref Range   Phosphorus 2.8 2.5 - 4.6 mg/dL    Comment: Performed at [facility], [place] [address], [place], [place] [address]  Triglycerides     Status: None   Collection Time: 01/07/22  4:30 AM  Result Value Ref Range   Triglycerides 52 <150 mg/dL    Comment:  Performed at [facility], [place] [address], [place], [place] [address]  Glucose, capillary     Status: Abnormal   Collection Time: 01/07/22  7:51 AM  Result Value Ref Range   Glucose-Capillary 144 (H) 70 - 99 mg/dL    Comment: Glucose reference range applies only to samples taken after fasting for at least 8 hours.   Comment 1 Notify RN    Comment 2 Document in Chart   Glucose, capillary     Status: Abnormal   Collection Time: 01/07/22  1:25 PM  Result Value Ref Range   Glucose-Capillary 169 (H) 70 - 99 mg/dL    Comment: Glucose reference range applies only to samples taken after fasting for at least 8 hours.  Glucose, capillary     Status: Abnormal   Collection Time: 01/07/22  4:21 PM  Result Value Ref Range   Glucose-Capillary 175 (H) 70 - 99 mg/dL    Comment: Glucose reference range applies only to samples taken after fasting for at least 8 hours.  Glucose, capillary     Status: Abnormal   Collection Time: 01/07/22  8:00 PM  Result Value Ref Range   Glucose-Capillary 186 (H) 70 - 99 mg/dL    Comment: Glucose reference range applies only to samples taken after fasting for at least 8 hours.  Glucose, capillary     Status: Abnormal   Collection Time: 01/08/22 12:02 AM  Result Value Ref Range   Glucose-Capillary 177 (H) 70 - 99 mg/dL    Comment: Glucose reference range applies only to samples taken after fasting for at least 8 hours.  Basic metabolic panel     Status: Abnormal   Collection Time: 01/08/22  3:24 AM  Result Value Ref Range   Sodium 147 (H) 135 - 145 mmol/L   Potassium 4.3 3.5 - 5.1 mmol/L   Chloride 114 (H) 98 - 111 mmol/L   CO2 26 22 - 32 mmol/L   Glucose, Bld 156 (H) 70 - 99 mg/dL    Comment: Glucose reference range applies only to samples taken after fasting for at least 8 hours.   BUN 38 (H) 8 - 23 mg/dL   Creatinine, Ser 1.60 (H) 0.61 - 1.24 mg/dL   Calcium 8.4 (L) 8.9 - 10.3 mg/dL   GFR, Estimated 41 (L) >60 mL/min    Comment:  (NOTE) Calculated using the CKD-EPI Creatinine Equation (2021)    Anion gap 7 5 - 15    Comment: Performed at [facility], [place] [address], [place], [place] [address]  Magnesium     Status: None   Collection Time: 01/08/22  3:24 AM  Result Value Ref Range   Magnesium 2.0 1.7 - 2.4 mg/dL    Comment: Performed at [facility], [place] [address] [place], [place] [address]  Phosphorus     Status: None   Collection Time: 01/08/22  3:24 AM  Result Value Ref Range   Phosphorus 3.0 2.5 - 4.6 mg/dL    Comment: Performed at [facility], [place] [address] [place], [place] [address]  Glucose, capillary  Status: Abnormal   Collection Time: 01/08/22  4:17 AM  Result Value Ref Range   Glucose-Capillary 178 (H) 70 - 99 mg/dL    Comment: Glucose reference range applies only to samples taken after fasting for at least 8 hours.  Glucose, capillary     Status: Abnormal   Collection Time: 01/08/22  7:49 AM  Result Value Ref Range   Glucose-Capillary 144 (H) 70 - 99 mg/dL    Comment: Glucose reference range applies only to samples taken after fasting for at least 8 hours.  Glucose, capillary     Status: Abnormal   Collection Time: 01/08/22 11:55 AM  Result Value Ref Range   Glucose-Capillary 152 (H) 70 - 99 mg/dL    Comment: Glucose reference range applies only to samples taken after fasting for at least 8 hours.    ECG   Afib - Personally Reviewed  Telemetry   AFib with CVR - Personally Reviewed  Radiology   DG UGI W SINGLE CM (SOL OR THIN BA)  Result Date: 01/07/2022 CLINICAL DATA:  Pneumoperitoneum in the setting of known gastric cancer on chemotherapy. Status post ex lap drainage of intra-abdominal abscess. EXAM: UPPER GI SERIES WITH KUB TECHNIQUE: A water-soluble contrast upper GI series was attempted. The examination was attempted by Rowe Robert, PA-C, and was supervised and interpreted by Dr. Kellie Simmering. FLUOROSCOPY: Fluoroscopy  time: 42 seconds (12.9 mGy). COMPARISON:  CTA abdomen/pelvis 12/31/2021. FINDINGS: Prior to the procedure, a scout radiograph of the abdomen was obtained. Bilateral surgical drains. Gastroduodenal bypass device. No dilated loops of small bowel. Gaseous distention of the colon. Degenerative changes of the spine and bilateral femoroacetabular joints. Subsequently, a water-soluble contrast upper GI series was attempted. However, the patient was unable to swallow the contrast and the examination was terminated. IMPRESSION: Attempted water-soluble contrast upper GI series, as described. The patient was unable to swallow the contrast, and the examination was terminated. Electronically Signed   By: Kellie Simmering D.O.   On: 01/07/2022 12:57    Cardiac Studies   Indications:    dyspnea     History:        Patient has prior history of Echocardiogram examinations,  most                  recent 08/16/2019. CAD, Arrythmias:Atrial Fibrillation;  Risk                  Factors:Hypertension.     Sonographer:    Joette Catching RCS  Referring Phys: [id number] Hortencia Conradi MEIER      Sonographer Comments: Global longitudinal strain was attempted.  IMPRESSIONS     1. Left ventricular ejection fraction, by estimation, is 25 to 30%. The  left ventricle has severely decreased function. The left ventricle  demonstrates global hypokinesis. Left ventricular diastolic parameters are  consistent with Grade II diastolic  dysfunction (pseudonormalization). The average left ventricular global  longitudinal strain is -8.0 %. The global longitudinal strain is abnormal.   2. Right ventricular systolic function is mildly reduced. The right  ventricular size is moderately enlarged. There is mildly elevated  pulmonary artery systolic pressure. The estimated right ventricular  systolic pressure is 99.3 mmHg.   3. Left atrial size was severely dilated.   4. Right atrial size was moderately dilated.   5. The mitral valve is  normal in structure. Moderate mitral valve  regurgitation. No evidence of mitral stenosis.   6. Tricuspid valve regurgitation is moderate.   7. The aortic  valve is tricuspid. There is mild calcification of the  aortic valve. There is mild thickening of the aortic valve. Aortic valve  regurgitation is severe. Aortic valve sclerosis is present, with no  evidence of aortic valve stenosis.   8. The inferior vena cava is normal in size with greater than 50%  respiratory variability, suggesting right atrial pressure of 3 mmHg.   Comparison(s): Prior images reviewed side by side.   Impression   Principal Problem:   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HCC) Active Problems:   Coronary artery disease   Atrial fibrillation (HCC)   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HCC)   Acute on chronic systolic heart failure [facility])   Recommendation   Dillon Smith has had a slow recovery now 8 days out from surgery for gastric perforation with abscess.  Dillon Smith has a history of atrial fibrillation and from what I can tell has been rate controlled while Dillon Smith is here but for some reason is on amiodarone IV.  Dillon Smith is not taking p.o. and is currently on TPN, so would continue this.  Options for management of his A-fib are otherwise limited.  Echo, more importantly did show reduced EF down to 25 to 30%, previously 40 to 45% in 2021.  Again as Dillon Smith is not able to take medications at this time, treatment options are limited.  Dillon Smith is not a candidate for catheterization given recent surgery and not an ideal candidate to pursue cardioversion.  Dillon Smith was restarted on a heparin drip today but has been off of Eliquis.  Dillon Smith would qualify for 2.5 mg twice daily dosing.  I had a long discussion with his daughter at the bedside today.  Dillon Smith is being followed by palliative care for pain management but is full code.  Given this new findings, I suspect Dillon Smith would be a poor candidate for resuming chemotherapy and ultimately may need to consider hospice.  Dillon Smith is noted to be recorded  11 L positive since admission with a creatinine of 1.6.  Weight has not been measured.  Will start diuresis- monitor renal function, check BNP in am.  Thanks for the consultation.  Cardiology will follow with you.  Time Spent Directly with Patient:  I have spent a total of 45 minutes with the patient reviewing hospital notes, telemetry, EKGs, labs and examining the patient as well as establishing an assessment and plan that was discussed personally with the patient.  > 50% of time was spent in direct patient care.  Length of Stay:  LOS: 8 days   Pixie Casino, MD, [facility], [place] Director of the Advanced Lipid Disorders &  Cardiovascular Risk Reduction Clinic Diplomate of the American Board of Clinical Lipidology Attending Cardiologist  Direct Dial: [phone]  Fax: [phone]  Website:  [email or web address] Fenter 01/08/2022, 12:20 PM

## 2022-01-08 NOTE — Progress Notes (Signed)
Received report on patient from Felicity Coyer, RN at [phone]. Will resume patient's care until 1900. Agree with patient's most recent assessment. Will continue to monitor patient for any changes for the rest of the shift.

## 2022-01-08 NOTE — Evaluation (Signed)
Physical Therapy Evaluation Patient Details Name: Dillon Smith MRN: [id number] DOB: [birth year] Today's Date: 01/08/2022  History of Present Illness  88 yo male admitted with gastric perforation. S/P ex lap, drainage of abscess. Hx of gastric ca, ckd, bradycardia, covid, prostate ca, cva, afib.  Clinical Impression  Bed level eval only. Mod A +2 for rolling to L and R sides for hygiene, linen change. Pt having loose stools at time of eval (x 2 during session). He was agreeable (per daughter) to mobilizing. He is having some confusion-possibly medication related. Deferred OOB at this time due to bowel incontinence. Daughter was present during session. She is out of town but she stated she is able/willing to stay with pt if necessary. Will plan to follow pt during hospital stay. Unsure of d/c plan at this time-could possible consider AIR consult if pt progresses well medically and physically. However, may also need to consider ST SNF. Will continue to update recommendations as able.       Recommendations for follow up therapy are one component of a multi-disciplinary discharge planning process, led by the attending physician.  Recommendations may be updated based on patient status, additional functional criteria and insurance authorization.  Follow Up Recommendations  (continuing to assess)      Assistance Recommended at Discharge Frequent or constant Supervision/Assistance  Patient can return home with the following  Two people to help with walking and/or transfers;A little help with bathing/dressing/bathroom;Two people to help with bathing/dressing/bathroom;Assist for transportation;Help with stairs or ramp for entrance;Assistance with cooking/housework    Equipment Recommendations Rolling walker (2 wheels)  Recommendations for Other Services  OT consult    Functional Status Assessment Patient has had a recent decline in their functional status and demonstrates the ability to make  significant improvements in function in a reasonable and predictable amount of time.     Precautions / Restrictions Precautions Precautions: Fall Precaution Comments: multiple lines, (2) jp drains, abd wound; incontinent Restrictions Weight Bearing Restrictions: No      Mobility  Bed Mobility Overal bed mobility: Needs Assistance Bed Mobility: Rolling Rolling: Mod assist, +2 for physical assistance, +2 for safety/equipment         General bed mobility comments: Rolling to L and R sides for hygiene/changing linens. Multimodal cueing required. Assist to position LEs and UEs. Deferred OOB at this time due to loose stools x 2    Transfers                        Ambulation/Gait                  Stairs            Wheelchair Mobility    Modified Rankin (Stroke Patients Only)       Balance                                             Pertinent Vitals/Pain Pain Assessment Pain Assessment: Faces Faces Pain Scale: Hurts even more Pain Location: abdomen, buttocks Pain Descriptors / Indicators: Grimacing, Guarding, Moaning Pain Intervention(s): Limited activity within patient's tolerance, Monitored during session, Repositioned    Home Living Family/patient expects to be discharged to:: Unsure Living Arrangements: Alone Available Help at Discharge: Family;Available 24 hours/day (daughter stated she can be with pt) Type of Home: Apartment Home Access: Stairs to enter Entrance  Stairs-Rails: Right Entrance Stairs-Number of Steps: 3   Home Layout: One level Home Equipment: Cane - single point      Prior Function Prior Level of Function : Independent/Modified Independent             Mobility Comments: ambulatory with a cane most recently. ADLs Comments: mod ind. still drives     Hand Dominance        Extremity/Trunk Assessment   Upper Extremity Assessment Upper Extremity Assessment: Defer to OT evaluation     Lower Extremity Assessment Lower Extremity Assessment: Generalized weakness    Cervical / Trunk Assessment Cervical / Trunk Assessment: Normal  Communication   Communication: HOH  Cognition Arousal/Alertness: Awake/alert (but drowsy) Behavior During Therapy: WFL for tasks assessed/performed Overall Cognitive Status: Impaired/Different from baseline Area of Impairment: Orientation, Memory, Problem solving, Following commands                 Orientation Level: Disoriented to, Place, Time   Memory: Decreased short-term memory Following Commands: Follows one step commands inconsistently     Problem Solving: Slow processing, Decreased initiation, Requires verbal cues, Requires tactile cues, Difficulty sequencing          General Comments      Exercises     Assessment/Plan    PT Assessment Patient needs continued PT services  PT Problem List Decreased strength;Decreased balance;Decreased range of motion;Decreased activity tolerance;Decreased mobility;Decreased cognition;Decreased coordination;Decreased knowledge of use of DME;Pain;Decreased skin integrity       PT Treatment Interventions DME instruction;Gait training;Functional mobility training;Therapeutic activities;Balance training;Patient/family education;Therapeutic exercise    PT Goals (Current goals can be found in the Care Plan section)  Acute Rehab PT Goals Patient Stated Goal: ultimately, for pt to be able to return home PT Goal Formulation: With family Time For Goal Achievement: 01/22/22 Potential to Achieve Goals: Good    Frequency Min 3X/week     Co-evaluation               AM-PAC PT "6 Clicks" Mobility  Outcome Measure Help needed turning from your back to your side while in a flat bed without using bedrails?: Total Help needed moving from lying on your back to sitting on the side of a flat bed without using bedrails?: Total Help needed moving to and from a bed to a chair (including a  wheelchair)?: Total Help needed standing up from a chair using your arms (e.g., wheelchair or bedside chair)?: Total Help needed to walk in hospital room?: Total Help needed climbing 3-5 steps with a railing? : Total 6 Click Score: 6    End of Session   Activity Tolerance: Patient limited by fatigue;Patient limited by pain Patient left: in bed;with call bell/phone within reach;with nursing/sitter in room;with family/visitor present   PT Visit Diagnosis: Pain;Muscle weakness (generalized) (M62.81);Difficulty in walking, not elsewhere classified (R26.2) Pain - part of body:  (abdomen, buttocks)    Time: 5053-9767 PT Time Calculation (min) (ACUTE ONLY): 20 min   Charges:   PT Evaluation $PT Eval Moderate Complexity: 1 Mod            Doreatha Massed, PT Acute Rehabilitation  Office: [phone] Pager: [phone]

## 2022-01-08 NOTE — Progress Notes (Signed)
PHARMACY - TOTAL PARENTERAL NUTRITION CONSULT NOTE   Indication: Prolonged ileus Pneumoperitoneum in the setting of known gastric cancer on chemotherapy s/p ex lap drainage of intra-abdominal abscess  Patient Measurements: Height: '5\' 11"'$  (180.3 cm) Weight: 89.6 kg (197 lb 8.5 oz) IBW/kg (Calculated) : 75.3 TPN AdjBW (KG): 84.7 Body mass index is 27.55 kg/m.  Assessment: 88 yo male with perforated gastric cancer on chemotherapy s/p ex lap with intraabdominal abscess drainage 7/3. PMH includes Afib on eliquis, CKDIII, HTN, prostate cancer, CVA, and HLD. Pharmacy consulted to start TPN for possible prolonged ileus.  Glucose / Insulin: CBGs <180 except one 186.  - 10 units SSI used in past 24hr Electrolytes: Na/Cl and Corr Ca 10.16 elevated  Renal: AKI on CKDIII. SC1.6. UOP 1L UOP last 24h Hepatic: AST/ALT improved to WNL, Tbili WNL, Alb low 1.8, Trig 52 Intake / Output: NGT pulled out by patient, drains 65m/24h, LBM 7/10. No po intakes noted [facility] GI Imaging: 7/3 CT intra-abdominal free air which was concerning for bowel perforation GI Surgeries / Procedures:  7/3 ex lap, drainage of intraabdominal abscess  Central access: PICC 7/7, Implanted port TPN start date: 7/7  Nutritional Goals: Goal TPN rate is 90 mL/hr (provides 119 g of protein and 2372 kcals per day)  RD Assessment: Estimated Needs Total Energy Estimated Needs: 2300-2500 kcal Total Protein Estimated Needs: 110-125 grams Total Fluid Estimated Needs: >/= 2.3 L/day  Current Nutrition:  7/10: CLD.   Plan:  No changes today Continue TPN at goal 944mhr at 1800 Electrolytes in TPN:  Na 0 mEq/L (decrease) K 5078mL Ca 0 mEq/L (decrease) Mg 0mE40m, may need to slowly start resuming soon. Phos 15mm59m Cl:Ac max acetate Add standard MVI and trace elements to TPN Continue Sensitive q4h SSI and adjust as needed  Monitor TPN labs on Mon/Thurs and PRN F/u swallow eval  Jujhar Everett S. RoberAlford HighlandarmD, BCPS Clinical Staff Pharmacist [email or web address] 01/08/2022 7:26 AM

## 2022-01-08 NOTE — Consult Note (Signed)
Initial Consultation Note   Patient: Dillon Smith RWE:[id number] DOB: 11/29/1929 PCP: [name] DOA: 12/31/2021 DOS: the patient was seen and examined on 01/08/2022 Primary service: Edison Pace, Md, MD  Referring physician: Dr. Harlow Asa Reason for consult: medical management of chronic conditions: CKD, HTN, HLD  Assessment and Plan: AKI on CKD 3a     - looks like his baseline is about 1.1 - 1.3; he's at 1.6 today (on the whole for his stay is actually improved)     - he will be diuresed w/ cardiology today; monitor I&O  HTN     - can resume home regimen as BP tolerates when he is allowed PO again; for now, BP looks ok  HLD     - can resume home regimen when he is allowed PO again  A fib     - currently on amiodarone and heparin gtt     - cards consulted; defer to them  Gastric perforation     - per primary team     - pain management per palliative  DM2     - he's on TPN right now; starting PO soon?     - continue SSI; glucose acceptable  TRH will continue to follow the patient.  HPI: Dillon Smith is a 88 y.o. male with past medical history of CKD3a, PAF, HLD, HTN, CAD, gastric cancer on chemotherapy. He was admitted to the surgical service with a presenting complaint of abdominal pain. His w/u revealed pneumoperitoneum from a gastric perforation. He was taken to the OR 8 days ago for ex-lap drainage and repair. He successfully completed the procedure and has been recovering since. CCM has signed off as he has transitioned to the floor. General surgery has asked TRH with assistance of general medical management.   Review of Systems: As mentioned in the history of present illness. All other systems reviewed and are negative. Past Medical History:  Diagnosis Date   Bradycardia    CKD (chronic kidney disease), stage III ([place])    Coronary artery disease    CABG 2007   COVID-19 08/2019   Hyperlipidemia LDL goal <70    Hypertension    Persistent atrial fibrillation  ([place])    Prostate cancer ([place]) 2010   PVC's (premature ventricular contractions)    Stroke (cerebrum) ([place])    Past Surgical History:  Procedure Laterality Date   COLONOSCOPY  2004   ESOPHAGOGASTRODUODENOSCOPY (EGD) WITH PROPOFOL N/A 11/22/2021   Procedure: ESOPHAGOGASTRODUODENOSCOPY (EGD) WITH PROPOFOL;  Surgeon: Carol Ada, MD;  Location: [facility];  Service: Gastroenterology;  Laterality: N/A;   EUS N/A 11/22/2021   Procedure: UPPER ENDOSCOPIC ULTRASOUND (EUS) RADIAL;  Surgeon: Carol Ada, MD;  Location: [facility];  Service: Gastroenterology;  Laterality: N/A;   INSERTION PROSTATE RADIATION SEED  2010   IR IMAGING GUIDED PORT INSERTION  11/28/2021   LAPAROTOMY N/A 12/31/2021   Procedure: EXPLORATORY LAPAROTOMY;  Surgeon: Jovita Kussmaul, MD;  Location: [facility] ORS;  Service: General;  Laterality: N/A;  DRAINAGE OF INTRAABDOMINAL ABSCESS   stent     Social History:  reports that he has quit smoking. He has never used smokeless tobacco. He reports that he does not currently use alcohol. He reports that he does not currently use drugs.  Allergies  Allergen Reactions   Penicillins Itching    Family History  Problem Relation Age of Onset   Cancer Neg Hx     Prior to Admission medications   Medication Sig Start Date End Date  Taking? Authorizing Provider  amLODipine (NORVASC) 10 MG tablet Take 10 mg by mouth daily. 06/01/19  Yes [provider]  apixaban (ELIQUIS) 5 MG TABS tablet Take 2.5 mg by mouth 2 (two) times daily.   Yes [provider]  atorvastatin (LIPITOR) 40 MG tablet Take 1 tablet (40 mg total) by mouth at bedtime. 08/17/19  Yes Garvin Fila, MD  glycerin adult 2 g suppository Place 1 suppository rectally as needed for constipation. 12/22/21  Yes Regan Lemming, MD  lidocaine-prilocaine (EMLA) cream Apply to affected area once Patient taking differently: Apply 1 Application topically once. 11/23/21  Yes [name], MD  Multiple Vitamin (MULTI VITAMIN  DAILY PO) Take 0.5 tablets by mouth daily.   Yes [provider]  capecitabine (XELODA) 500 MG tablet Take by mouth 2 (two) times daily after a meal. Patient not taking: Reported on 01/01/2022    [provider]  metoprolol tartrate (LOPRESSOR) 50 MG tablet Take 50 mg by mouth 2 (two) times daily. Patient not taking: Reported on 01/01/2022    [provider]  ondansetron (ZOFRAN) 8 MG tablet Take 1 tablet (8 mg total) by mouth 2 (two) times daily as needed for refractory nausea / vomiting. Start on day 3 after chemotherapy. Patient not taking: Reported on 01/01/2022 11/23/21   [name], MD  prochlorperazine (COMPAZINE) 10 MG tablet Take 1 tablet (10 mg total) by mouth every 6 (six) hours as needed (Nausea or vomiting). Patient not taking: Reported on 01/01/2022 11/23/21   [name], MD    Physical Exam: Vitals:   01/08/22 1310 01/08/22 1345 01/08/22 1430 01/08/22 1525  BP: 133/73 136/86 128/82 130/82  Pulse: 79     Resp: 18     Temp: 98.7 F (37.1 C)  98.4 F (36.9 C)   TempSrc: Oral     SpO2: 99%     Weight:      Height:       General: 88 y.o. male resting in bed in NAD Eyes: PERRL, normal sclera ENMT: Nares patent w/o discharge, orophaynx clear, dentition normal, ears w/o discharge/lesions/ulcers Neck: Supple, trachea midline Cardiovascular: irregular, +S1, S2, no m/g/r, equal pulses throughout Respiratory: CTABL, no w/r/r, normal WOB GI: BS+, tender d/t surgery, bandages CDI, no masses noted, no organomegaly noted MSK: No c/c; BLE 1+ edema Neuro: A&O x 3, HoH otherwise no focal deficits Psyc: Appropriate interaction and affect, calm/cooperative  Data Reviewed:   Na+  147 K+  4.3 Cl-  114 CO2  26 Glucose  156 BUN  8.4      Family Communication: w/ dtr at bedside Primary team communication: w/ PA Thank you very much for involving [name] in the care of your patient.  Author: Jonnie Finner, DO 01/08/2022 4:04 PM  For on call review [email or web address].

## 2022-01-08 NOTE — Progress Notes (Signed)
ANTICOAGULATION CONSULT NOTE - follow up  Pharmacy Consult for Heparin Indication: h/o afib and CVA  Allergies  Allergen Reactions   Penicillins Itching    Patient Measurements: Height: '5\' 11"'$  (180.3 cm) Weight: 89.6 kg (197 lb 8.5 oz) IBW/kg (Calculated) : 75.3 Heparin Dosing Weight: 89.6 kg  Vital Signs: Temp: 98.9 F (37.2 C) (07/11 2025) Temp Source: Oral (07/11 1310) BP: 148/83 (07/11 2025) Pulse Rate: 80 (07/11 2025)  Labs: Recent Labs    01/06/22 0510 01/07/22 0430 01/08/22 0324 01/08/22 2143  HGB 10.2* 9.6*  --   --   HCT 30.1* 28.4*  --   --   PLT 68* 103*  --   --   HEPARINUNFRC  --   --   --  <0.10*  CREATININE 1.79* 1.50* 1.60*  --      Estimated Creatinine Clearance: 32.7 mL/min (A) (by C-G formula based on SCr of 1.6 mg/dL (H)).   Medical History: Past Medical History:  Diagnosis Date   Bradycardia    CKD (chronic kidney disease), stage III ([place])    Coronary artery disease    CABG 2007   COVID-19 08/2019   Hyperlipidemia LDL goal <70    Hypertension    Persistent atrial fibrillation ([place])    Prostate cancer ([place]) 2010   PVC's (premature ventricular contractions)    Stroke (cerebrum) ([facility])     Assessment: AC/Heme: h/o afib and CVA. CHADS2VASC at least 8 (?DM with last A1C 6.3 in 2021) -Holding PTA Eliquis -Hgb was trending down but now stable. Plts up to 103. - 7/11 Start IV heparin (no bolus)  1st HL < 0.1, subtherapeutic on 1250 units/hr No bleeding or interruptions noted  Goal of Therapy:  Heparin level 0.3-0.7 units/ml Monitor platelets by anticoagulation protocol: Yes   Plan:  Increase heparin drip to 1500 units/hr Check heparin level in 8 hrs. Daily HL and CBC   Dolly Rias RPh 01/08/2022, 10:40 PM

## 2022-01-08 NOTE — Progress Notes (Signed)
Palliative Care Progress Note  FU VG:KKDPTEL management today. Worsening pain over the weekend-started on Fentanyl PCA by [facility]. I added fentanyl patch at 37.5. Some concerns about over sedation. Minimal use of PCA in last 24 hours. He is alert, interactive and much improved today.  Recommendations:  Fentanyl to 25 mcg q72 hours Stop PCA Transition to oral hydromorphone for breakthrough 2-'4mg'$  q4 prn FU in [facility] after discharge   Lane Hacker, DO Palliative Medicine

## 2022-01-09 DIAGNOSIS — I4819 Other persistent atrial fibrillation: Secondary | ICD-10-CM | POA: Diagnosis not present

## 2022-01-09 DIAGNOSIS — N1831 Chronic kidney disease, stage 3a: Secondary | ICD-10-CM | POA: Diagnosis not present

## 2022-01-09 DIAGNOSIS — I10 Essential (primary) hypertension: Secondary | ICD-10-CM | POA: Diagnosis not present

## 2022-01-09 DIAGNOSIS — I5023 Acute on chronic systolic (congestive) heart failure: Secondary | ICD-10-CM

## 2022-01-09 DIAGNOSIS — R198 Other specified symptoms and signs involving the digestive system and abdomen: Secondary | ICD-10-CM

## 2022-01-09 DIAGNOSIS — K255 Chronic or unspecified gastric ulcer with perforation: Secondary | ICD-10-CM | POA: Diagnosis not present

## 2022-01-09 LAB — CBC
HCT: 26.9 % — ABNORMAL LOW (ref 39.0–52.0)
Hemoglobin: 9 g/dL — ABNORMAL LOW (ref 13.0–17.0)
MCH: 30.9 pg (ref 26.0–34.0)
MCHC: 33.5 g/dL (ref 30.0–36.0)
MCV: 92.4 fL (ref 80.0–100.0)
Platelets: 159 10*3/uL (ref 150–400)
RBC: 2.91 MIL/uL — ABNORMAL LOW (ref 4.22–5.81)
RDW: 14.9 % (ref 11.5–15.5)
WBC: 5.1 10*3/uL (ref 4.0–10.5)
nRBC: 0 % (ref 0.0–0.2)

## 2022-01-09 LAB — BASIC METABOLIC PANEL
Anion gap: 5 (ref 5–15)
BUN: 36 mg/dL — ABNORMAL HIGH (ref 8–23)
CO2: 25 mmol/L (ref 22–32)
Calcium: 8 mg/dL — ABNORMAL LOW (ref 8.9–10.3)
Chloride: 112 mmol/L — ABNORMAL HIGH (ref 98–111)
Creatinine, Ser: 1.36 mg/dL — ABNORMAL HIGH (ref 0.61–1.24)
GFR, Estimated: 49 mL/min — ABNORMAL LOW (ref >60)
Glucose, Bld: 167 mg/dL — ABNORMAL HIGH (ref 70–99)
Potassium: 4 mmol/L (ref 3.5–5.1)
Sodium: 142 mmol/L (ref 135–145)

## 2022-01-09 LAB — MAGNESIUM: Magnesium: 1.7 mg/dL (ref 1.7–2.4)

## 2022-01-09 LAB — HEPARIN LEVEL (UNFRACTIONATED): Heparin Unfractionated: 0.17 IU/mL — ABNORMAL LOW (ref 0.30–0.70)

## 2022-01-09 LAB — BRAIN NATRIURETIC PEPTIDE: B Natriuretic Peptide: 482.5 pg/mL — ABNORMAL HIGH (ref 0.0–100.0)

## 2022-01-09 LAB — GLUCOSE, CAPILLARY
Glucose-Capillary: 130 mg/dL — ABNORMAL HIGH (ref 70–99)
Glucose-Capillary: 148 mg/dL — ABNORMAL HIGH (ref 70–99)
Glucose-Capillary: 148 mg/dL — ABNORMAL HIGH (ref 70–99)
Glucose-Capillary: 154 mg/dL — ABNORMAL HIGH (ref 70–99)
Glucose-Capillary: 159 mg/dL — ABNORMAL HIGH (ref 70–99)
Glucose-Capillary: 170 mg/dL — ABNORMAL HIGH (ref 70–99)

## 2022-01-09 LAB — PHOSPHORUS: Phosphorus: 3.1 mg/dL (ref 2.5–4.6)

## 2022-01-09 LAB — ALBUMIN: Albumin: 1.7 g/dL — ABNORMAL LOW (ref 3.5–5.0)

## 2022-01-09 MED ORDER — APIXABAN 2.5 MG PO TABS: 2.5000 mg | ORAL_TABLET | Freq: Two times a day (BID) | ORAL | Status: DC

## 2022-01-09 MED ORDER — APIXABAN 5 MG PO TABS
5.0000 mg | ORAL_TABLET | Freq: Two times a day (BID) | ORAL | Status: DC
  Administered 2022-01-09 – 2022-01-14 (×10): 5 mg via ORAL
  Filled 2022-01-09 (×10): qty 1

## 2022-01-09 MED ORDER — INSULIN ASPART 100 UNIT/ML IJ SOLN
0.0000 [IU] | INTRAMUSCULAR | Status: DC
  Administered 2022-01-09 – 2022-01-11 (×10): 2 [IU] via SUBCUTANEOUS

## 2022-01-09 MED ORDER — TRACE MINERALS CU-MN-SE-ZN 300-55-60-3000 MCG/ML IV SOLN
INTRAVENOUS | Status: AC
  Filled 2022-01-09: qty 828

## 2022-01-09 MED ORDER — FUROSEMIDE 10 MG/ML IJ SOLN
40.0000 mg | Freq: Every day | INTRAMUSCULAR | Status: DC
  Administered 2022-01-09 – 2022-01-10 (×2): 40 mg via INTRAVENOUS
  Filled 2022-01-09 (×2): qty 4

## 2022-01-09 MED ORDER — TRAVASOL 10 % IV SOLN: INTRAVENOUS | Status: DC

## 2022-01-09 MED ORDER — AMIODARONE HCL 200 MG PO TABS
200.0000 mg | ORAL_TABLET | Freq: Every day | ORAL | Status: DC
  Administered 2022-01-09 – 2022-01-22 (×14): 200 mg via ORAL
  Filled 2022-01-09 (×14): qty 1

## 2022-01-09 MED ORDER — APIXABAN 5 MG PO TABS
5.0000 mg | ORAL_TABLET | Freq: Two times a day (BID) | ORAL | Status: DC
  Administered 2022-01-09: 5 mg via ORAL
  Filled 2022-01-09: qty 1

## 2022-01-09 MED FILL — Dexamethasone Sodium Phosphate Inj 100 MG/10ML: INTRAMUSCULAR | Qty: 1 | Status: AC

## 2022-01-09 NOTE — Progress Notes (Addendum)
Patient ID: Dillon Smith, male   DOB: June 06, 1930, 88 y.o.   MRN: [id number] [facility] Surgery Progress Note  9 Days Post-Op  Subjective: CC-  Sitting up in bed eating jello. Denies any n/v. He had 1 BM this morning.   Objective: Vital signs in last 24 hours: Temp:  [97.4 F (36.3 C)-98.9 F (37.2 C)] 97.4 F (36.3 C) (07/12 0523) Pulse Rate:  [64-80] 64 (07/12 0523) Resp:  [18] 18 (07/12 0523) BP: (126-148)/(66-86) 126/79 (07/12 1029) SpO2:  [99 %-100 %] 100 % (07/12 0523) Last BM Date : 01/08/22  Intake/Output from previous day: 07/11 0701 - 07/12 0700 In: 3876.1 [P.O.:540; I.V.:3286; IV Piggyback:50] Out: 2335 [Urine:2300; Drains:35] Intake/Output this shift: Total I/O In: 660 [P.O.:120; I.V.:540] Out: 150 [Urine:150]  PE: Gen: Alert, NAD Pulm:  Normal effort on room air Abd: Soft, appropriately tender, drains SS, midline wound pink and moist fascia in tact Skin: warm and dry, no rashes; LUE PICC, port R chest wall Psych: A&Ox3   Lab Results:  Recent Labs    01/07/22 0430 01/09/22 0506  WBC 5.7 5.1  HGB 9.6* 9.0*  HCT 28.4* 26.9*  PLT 103* 159   BMET Recent Labs    01/08/22 0324 01/09/22 0506  NA 147* 142  K 4.3 4.0  CL 114* 112*  CO2 26 25  GLUCOSE 156* 167*  BUN 38* 36*  CREATININE 1.60* 1.36*  CALCIUM 8.4* 8.0*   PT/INR No results for input(s): "LABPROT", "INR" in the last 72 hours. CMP     Component Value Date/Time   NA 142 01/09/2022 0506   NA 143 02/06/2021 0845   K 4.0 01/09/2022 0506   CL 112 (H) 01/09/2022 0506   CO2 25 01/09/2022 0506   GLUCOSE 167 (H) 01/09/2022 0506   BUN 36 (H) 01/09/2022 0506   BUN 23 02/06/2021 0845   CREATININE 1.36 (H) 01/09/2022 0506   CREATININE 1.57 (H) 12/13/2021 0827   CALCIUM 8.0 (L) 01/09/2022 0506   PROT 5.6 (L) 01/07/2022 0430   PROT 6.4 02/06/2021 0845   ALBUMIN 1.7 (L) 01/09/2022 0934   ALBUMIN 3.9 02/06/2021 0845   AST 30 01/07/2022 0430   AST 18 12/13/2021 0827   ALT 36  01/07/2022 0430   ALT 17 12/13/2021 0827   ALKPHOS 84 01/07/2022 0430   BILITOT 0.7 01/07/2022 0430   BILITOT 0.5 12/13/2021 0827   GFRNONAA 49 (L) 01/09/2022 0506   GFRNONAA 42 (L) 12/13/2021 0827   GFRAA 43 (L) 07/06/2020 1156   Lipase     Component Value Date/Time   LIPASE 35 12/31/2021 0941       Studies/Results: DG UGI W SINGLE CM (SOL OR THIN BA)  Result Date: 01/07/2022 CLINICAL DATA:  Pneumoperitoneum in the setting of known gastric cancer on chemotherapy. Status post ex lap drainage of intra-abdominal abscess. EXAM: UPPER GI SERIES WITH KUB TECHNIQUE: A water-soluble contrast upper GI series was attempted. The examination was attempted by Rowe Robert, PA-C, and was supervised and interpreted by Dr. Kellie Simmering. FLUOROSCOPY: Fluoroscopy time: 42 seconds (12.9 mGy). COMPARISON:  CTA abdomen/pelvis 12/31/2021. FINDINGS: Prior to the procedure, a scout radiograph of the abdomen was obtained. Bilateral surgical drains. Gastroduodenal bypass device. No dilated loops of small bowel. Gaseous distention of the colon. Degenerative changes of the spine and bilateral femoroacetabular joints. Subsequently, a water-soluble contrast upper GI series was attempted. However, the patient was unable to swallow the contrast and the examination was terminated. IMPRESSION: Attempted water-soluble contrast upper GI series,  as described. The patient was unable to swallow the contrast, and the examination was terminated. Electronically Signed   By: Kellie Simmering D.O.   On: 01/07/2022 12:57    Anti-infectives: Anti-infectives (From admission, onward)    Start     Dose/Rate Route Frequency Ordered Stop   01/04/22 1200  piperacillin-tazobactam (ZOSYN) IVPB 3.375 g  Status:  Discontinued        3.375 g 12.5 mL/hr over 240 Minutes Intravenous Every 8 hours 01/04/22 0729 01/08/22 1116   01/02/22 1400  piperacillin-tazobactam (ZOSYN) IVPB 2.25 g  Status:  Discontinued        2.25 g 100 mL/hr over 30 Minutes  Intravenous Every 8 hours 01/02/22 0718 01/04/22 0728   01/01/22 2000  vancomycin (VANCOCIN) IVPB 1000 mg/200 mL premix  Status:  Discontinued        1,000 mg 200 mL/hr over 60 Minutes Intravenous Every 24 hours 12/31/21 1820 01/01/22 0824   12/31/21 2000  piperacillin-tazobactam (ZOSYN) IVPB 3.375 g  Status:  Discontinued        3.375 g 12.5 mL/hr over 240 Minutes Intravenous Every 8 hours 12/31/21 1821 01/02/22 0718   12/31/21 1130  vancomycin (VANCOREADY) IVPB 1750 mg/350 mL        1,750 mg 175 mL/hr over 120 Minutes Intravenous  Once 12/31/21 1124 12/31/21 1411   12/31/21 1130  ceFEPIme (MAXIPIME) 2 g in sodium chloride 0.9 % 100 mL IVPB        2 g 200 mL/hr over 30 Minutes Intravenous  Once 12/31/21 1124 12/31/21 1226        Assessment/Plan Pneumoperitoneum in the setting of known gastric cancer on chemotherapy POD #9 status post ex lap drainage of intra-abdominal abscess 7/3 Dr. Marlou Starks Pulled out his NG 7/9, unable to get UGI due to inability to swallow all of the contrast. Taking in small amounts of clear liquids, no evidence of leak at this time with serosanguinous JP drainage. Will ask SLP to see for ongoing swallow eval, ok for full liquids from surgical standpoint if cleared by speech. Continue TPN Continue twice daily wet-to-dry dressing changes to midline wound  -Appreciate palliative care following and helping with pain control.  [facility] consulting for assistance with chronic medical conditions - AKI on CKD III, HTN, HLD -Cardiology consult for assistance with afib and heart failure - switched to oral amiodarone, eliquis   FEN: CLD, TPN ID: Zosyn 7/3-7/11 VTE: SCD's, eliquis Foley: external cath Dispo: progressive care, PT/OT, adv diet    LOS: 9 days    Wellington Hampshire, [facility] Surgery 01/09/2022, 11:44 AM Please see Amion for pager number during day hours 7:00am-4:30pm

## 2022-01-09 NOTE — Progress Notes (Signed)
Physical Therapy Treatment Patient Details Name: Dillon Smith MRN: [id number] DOB: 09-Jun-1930 Today's Date: 01/09/2022   History of Present Illness 88 yo male admitted with gastric perforation. S/P ex lap, drainage of abscess. Hx of gastric ca, ckd, bradycardia, covid, prostate ca, cva, afib.    PT Comments    Co-tx with OT on today. Pt was Total +2 for bed mobility, Min-Mod A for static sitting balance EOB. Unable to stand with STEDY on today 2* significant weakness. I was hopeful pt could potentially progress and we could consider an AIR consult based on high PLOF, potential for progress and known family support at discharge. However, if endurance/strength does not improve within reasonable time, will need to consider other postacute rehab venues. Will continue to follow and progress activity as safely able.      Recommendations for follow up therapy are one component of a multi-disciplinary discharge planning process, led by the attending physician.  Recommendations may be updated based on patient status, additional functional criteria and insurance authorization.  Follow Up Recommendations  Acute Inpatient Rehab (depending on mobility progress, medical course, palliative recs. Will also need to consider SNF)     Assistance Recommended at Discharge Frequent or constant Supervision/Assistance  Patient can return home with the following Two people to help with walking and/or transfers;Two people to help with bathing/dressing/bathroom;Assist for transportation;Help with stairs or ramp for entrance;Assistance with cooking/housework   Equipment Recommendations   (continuing to [employer] wheelchair, hospital bed)    Recommendations for Other Services OT consult     Precautions / Restrictions Precautions Precautions: Fall Precaution Comments: multiple lines, (2) jp drains, abd wound; incontinent Restrictions Weight Bearing Restrictions: No     Mobility  Bed Mobility Overal  bed mobility: Needs Assistance Bed Mobility: Supine to Sit, Sit to Supine     Supine to sit: Total assist, +2 for physical assistance, +2 for safety/equipment Sit to supine: Total assist, +2 for physical assistance, +2 for safety/equipment   General bed mobility comments: due to difficulty initiating and sequencing-initially very drowsy. pt requires Total A x 2 (Pt 15%). Assist for trunk and bil LEs. Multimodal cueing for initiation, technique. Utilized bedpad for scooting, positioning at EOB. Max A for static sitting balance with progressing to Min A with time.    Transfers Overall transfer level: Needs assistance   Transfers: Sit to/from Stand Sit to Stand: Total assist, +2 physical assistance, +2 safety/equipment, From elevated surface           General transfer comment: Attempted to stand x 2 with STEDY-only able to barely clear bottom from bed surface. Very weak! Unable to take weight through legs to power up at this time.    Ambulation/Gait                   Stairs             Wheelchair Mobility    Modified Rankin (Stroke Patients Only)       Balance Overall balance assessment: Needs assistance Sitting-balance support: Single extremity supported, Bilateral upper extremity supported, Feet supported, No upper extremity supported Sitting balance-Leahy Scale: Poor Sitting balance - Comments: initially Max A for sitting balance progressing to Min A and briefly min guard with increased time. With cues for posture and correction, pt able to demo attempts to correct although unable to sustain Postural control: Right lateral lean, Posterior lean   Standing balance-Leahy Scale: Zero  Cognition Arousal/Alertness: Awake/alert, Suspect due to medications (intermittently falling asleep during session) Behavior During Therapy: WFL for tasks assessed/performed Overall Cognitive Status: Impaired/Different from baseline Area  of Impairment: Memory, Problem solving, Following commands, Attention, Safety/judgement, Awareness                 Orientation Level: Disoriented to, Place, Time   Memory: Decreased short-term memory Following Commands: Follows one step commands inconsistently, Follows one step commands with increased time Safety/Judgement: Decreased awareness of deficits, Decreased awareness of safety   Problem Solving: Slow processing, Decreased initiation, Requires verbal cues, Requires tactile cues, Difficulty sequencing General Comments: intermittently falling asleep (likely d/t meds) but easily awakens. very pleasant, willing to participate though max multimodal cues needed with difficulty initiating tasks        Exercises      General Comments        Pertinent Vitals/Pain Pain Assessment Pain Assessment: Faces Faces Pain Scale: Hurts even more Pain Location: abdomen, generalized with activity Pain Descriptors / Indicators: Grimacing, Guarding, Moaning Pain Intervention(s): Limited activity within patient's tolerance, Monitored during session, Repositioned    Home Living Family/patient expects to be discharged to:: Private residence Living Arrangements: Alone Available Help at Discharge: Family;Available 24 hours/day (daughter reports she works from home out of state but can stay with pt to assist as needed) Type of Home: Apartment Home Access: Stairs to enter Entrance Stairs-Rails: Right Entrance Stairs-Number of Steps: 3   Home Layout: One [place]: [place] - single point      Prior Function            PT Goals (current goals can now be found in the care plan section) Progress towards PT goals: Progressing toward goals    Frequency    Min 3X/week      PT Plan Current plan remains appropriate    Co-evaluation   Reason for Co-Treatment: To address functional/ADL transfers;For patient/therapist safety   OT goals addressed during session: ADL's and  self-care      AM-PAC PT "6 Clicks" Mobility   Outcome Measure  Help needed turning from your back to your side while in a flat bed without using bedrails?: Total Help needed moving from lying on your back to sitting on the side of a flat bed without using bedrails?: Total Help needed moving to and from a bed to a chair (including a wheelchair)?: Total Help needed standing up from a chair using your arms (e.g., wheelchair or bedside chair)?: Total Help needed to walk in hospital room?: Total Help needed climbing 3-5 steps with a railing? : Total 6 Click Score: 6    End of Session   Activity Tolerance: Patient limited by pain;Patient limited by fatigue Patient left: in bed;with call bell/phone within reach;with nursing/sitter in room;with family/visitor present   PT Visit Diagnosis: Pain;Muscle weakness (generalized) (M62.81);Difficulty in walking, not elsewhere classified (R26.2) Pain - part of body:  (abdomen)     Time: 8588-5027 PT Time Calculation (min) (ACUTE ONLY): 35 min  Charges:  $Therapeutic Activity: 8-22 mins                        Doreatha Massed, PT Acute Rehabilitation  Office: [phone] Pager: [phone]

## 2022-01-09 NOTE — Progress Notes (Signed)
Daily Progress Note   Patient Name: Dillon Smith       Date: 01/09/2022 DOB: 11/29/1929  Age: 88 y.o. MRN#: [id number] Attending Physician: Nolon Nations, MD Primary Care Physician: [name] Admit Date: 12/31/2021  Reason for Consultation/Follow-up: Establishing goals of care  Subjective: Awake alert, resting in bed, appears with generalized weakness, responds appropriately to questions as to, noted to have made efforts to participate with PT/OT.  Call placed and discussed with daughter [name] regarding the patient's current condition, his broad goals of care and advance care planning, see below. Length of Stay: 9  Current Medications: Scheduled Meds:   amiodarone  200 mg Oral Daily   apixaban  5 mg Oral BID   Chlorhexidine Gluconate Cloth  6 each Topical Daily   diphenhydrAMINE  25 mg Intravenous QHS   fentaNYL  1 patch Transdermal Q72H   furosemide  40 mg Intravenous Daily   insulin aspart  0-15 Units Subcutaneous Q4H   insulin aspart  0-9 Units Subcutaneous Q4H   pantoprazole (PROTONIX) IV  40 mg Intravenous QHS   sodium chloride flush  10-40 mL Intracatheter Q12H   thiamine injection  100 mg Intravenous Daily    Continuous Infusions:  sodium chloride Stopped (01/04/22 1748)   TPN ADULT (ION) 90 mL/hr at 01/08/22 1735   TPN ADULT (ION)      PRN Meds: sodium chloride, bisacodyl, haloperidol lactate, heparin lock flush, HYDROmorphone (DILAUDID) injection, HYDROmorphone HCl, lip balm, ondansetron **OR** ondansetron (ZOFRAN) IV, mouth rinse, phenol, sodium chloride flush  Physical Exam         Awake alert No distress Regular work of breathing S 1 S 2  No edema Has port  Vital Signs: BP 126/79   Pulse 64   Temp (!) 97.4 F (36.3 C) (Axillary)   Resp 18   Ht 5'  11" (1.803 m)   Wt 89.6 kg   SpO2 100%   BMI 27.55 kg/m  SpO2: SpO2: 100 % O2 Device: O2 Device: Room Air O2 Flow Rate: O2 Flow Rate (L/min): 0 L/min  Intake/output summary:  Intake/Output Summary (Last 24 hours) at 01/09/2022 1427 Last data filed at 01/09/2022 1400 Gross per 24 hour  Intake 5157.49 ml  Output 2665 ml  Net 2492.49 ml    LBM: Last BM Date : 01/09/22 Baseline  Weight: Weight: 84.7 kg Most recent weight: Weight: 89.6 kg       Palliative Assessment/Data:      Patient Active Problem List   Diagnosis Date Noted   Acute on chronic systolic heart failure ([place]) 01/08/2022   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[place]) 12/31/2021   Port-A-Cath in place 12/13/2021   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[place]) 10/29/2021   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 08/19/2019   Symptomatic bradycardia 08/19/2019   Bradycardia 08/18/2019   COVID-19 virus infection 08/17/2019   Atrial fibrillation ([place]) 08/17/2019   Hyperlipidemia 08/17/2019   Stage 3a chronic kidney disease (CKD) ([place]) 08/17/2019   Acute ischemic stroke (HCC) mult R MCA and PCA embolic infarcts d/t AF not on AC s/p tPA 08/15/2019   CARCINOMA, PROSTATE 09/15/2007   Essential hypertension 09/15/2007   Coronary artery disease 09/15/2007   HEMORRHOIDS, INTERNAL 09/15/2007   RADIATION PROCTITIS 09/15/2007    Palliative Care Assessment & Plan   Patient Profile:    Assessment: 88 year old gentleman admitted with pneumoperitoneum in the setting of known gastric cancer, status post ex lap drainage of intra-abdominal abscess, palliative care following for assistance with pain management.  Discussion/recommendations/Plan: Patient seen and examined.  Discussed with daughter [name] on the phone.  Now started on clear liquids.  Pain management medication regimen noted.  [name] states that her and the patient's other daughter Yvonne Kendall are the patient's healthcare power of attorney agents.  He completed living will and  HCPOA paperwork back in [place] several years ago.  At that time, patient had elected for DO NOT RESUSCITATE.  We discussed about the patient's current underlying condition and the scope of current hospitalization as well as his current functional and nutritional status.  Goals wishes and values attempted to be explored.  CODE STATUS discussions undertaken.  At this time, CODE STATUS modified to DNR/DNI based on patient's previously expressed wishes as well as a his current overall condition.  Continue to monitor hospital course and overall disease trajectory of illness.  Plan remains for possibility of CIR versus SNF rehab with palliative care on discharge. Goals of Care and Additional Recommendations: Limitations on Scope of Treatment: Full Scope Treatment  Code Status:    Code Status Orders  (From admission, onward)           Start     Ordered   12/31/21 2012  Full code  Continuous        12/31/21 2011           Code Status History     Date Active Date Inactive Code Status Order ID Comments User Context   12/31/2021 1440 12/31/2021 2012 DNR [id number]  Garald Balding, PA-C ED   08/19/2019 0131 08/21/2019 1831 Full Code [id number]  Vianne Bulls, MD ED   08/15/2019 2100 08/17/2019 2210 Full Code [id number]  Amie Portland, MD ED      Advance Directive Documentation    [place] Most Recent Value  Type of Advance Directive Living will  Pre-existing out of facility DNR order (yellow form or pink MOST form) --  "MOST" Form in Place? --       Prognosis:  Guarded   Discharge Planning: To Be Determined  Care plan was discussed with patient and daughter [name]   Thank you for allowing the Palliative Medicine Team to assist in the care of this patient.    MOD MDM.     Greater than 50%  of this time was spent counseling and coordinating care  related to the above assessment and plan.  Loistine Chance, MD  Please contact Palliative Medicine Team phone at [phone] for questions  and concerns.

## 2022-01-09 NOTE — Progress Notes (Addendum)
Inpatient Rehab Admissions Coordinator:   Per therapy recommendations  patient was screened for CIR candidacy by Clemens Catholic, MS, CCC-SLP . At this time, Pt. Appears to be a a potential candidate for CIR. I will place   order for rehab consult per protocol for full assessment. Note that this may ultimately be a reduced burden of care with discharge to SNF following CIR. Please contact me any with questions.  Clemens Catholic, [place], [place] Admissions Coordinator  [phone] ([place]) [phone] (office)

## 2022-01-09 NOTE — Progress Notes (Signed)
DAILY PROGRESS NOTE   Patient Name: Dillon Smith Date of Encounter: 01/09/2022 Cardiologist: Lauree Chandler, MD  Chief Complaint   No complaints  Patient Profile   88 yo male admitted with abdominal pain, found to have pneumoperitoneum with recently diagnosed gastric cancer, now in afib  Subjective   Up in the bed - eating a small amount of clear liquids. More alert today. I explained to him the results of his echo which show LVEF 25-30%. He remains on IV amiodarone and heparin. Recorded net positive yesterday, despite 2L urine output - he had 3.2L of IV intake.   Objective   Vitals:   01/08/22 1430 01/08/22 1525 01/08/22 2025 01/09/22 0523  BP: 128/82 130/82 (!) 148/83 128/66  Pulse:   80 64  Resp:   18 18  Temp: 98.4 F (36.9 C)  98.9 F (37.2 C) (!) 97.4 F (36.3 C)  TempSrc:    Axillary  SpO2:   100% 100%  Weight:      Height:        Intake/Output Summary (Last 24 hours) at 01/09/2022 0959 Last data filed at 01/09/2022 0741 Gross per 24 hour  Intake 4323.42 ml  Output 1985 ml  Net 2338.42 ml   Filed Weights   12/31/21 2015 01/04/22 0908  Weight: 84.7 kg 89.6 kg    Physical Exam   General appearance: alert and no distress Lungs: clear to auscultation bilaterally Heart: regular rate and rhythm Extremities: edema trace LE bilateral Neurologic: Mental status: Alert, oriented, thought content appropriate, hard of hearing  Inpatient Medications    Scheduled Meds:  Chlorhexidine Gluconate Cloth  6 each Topical Daily   diphenhydrAMINE  25 mg Intravenous QHS   fentaNYL  1 patch Transdermal Q72H   insulin aspart  0-9 Units Subcutaneous Q4H   pantoprazole (PROTONIX) IV  40 mg Intravenous QHS   sodium chloride flush  10-40 mL Intracatheter Q12H   thiamine injection  100 mg Intravenous Daily    Continuous Infusions:  sodium chloride Stopped (01/04/22 1748)   sodium chloride Stopped (01/05/22 1124)   amiodarone 30 mg/hr (01/09/22 0958)    heparin 1,500 Units/hr (01/09/22 0427)   TPN ADULT (ION) 90 mL/hr at 01/08/22 1735    PRN Meds: sodium chloride, bisacodyl, haloperidol lactate, heparin lock flush, HYDROmorphone (DILAUDID) injection, HYDROmorphone HCl, lip balm, ondansetron **OR** ondansetron (ZOFRAN) IV, mouth rinse, phenol, sodium chloride flush   Labs   Results for orders placed or performed during the hospital encounter of 12/31/21 (from the past 48 hour(s))  Glucose, capillary     Status: Abnormal   Collection Time: 01/07/22  1:25 PM  Result Value Ref Range   Glucose-Capillary 169 (H) 70 - 99 mg/dL    Comment: Glucose reference range applies only to samples taken after fasting for at least 8 hours.  Glucose, capillary     Status: Abnormal   Collection Time: 01/07/22  4:21 PM  Result Value Ref Range   Glucose-Capillary 175 (H) 70 - 99 mg/dL    Comment: Glucose reference range applies only to samples taken after fasting for at least 8 hours.  Glucose, capillary     Status: Abnormal   Collection Time: 01/07/22  8:00 PM  Result Value Ref Range   Glucose-Capillary 186 (H) 70 - 99 mg/dL    Comment: Glucose reference range applies only to samples taken after fasting for at least 8 hours.  Glucose, capillary     Status: Abnormal   Collection Time: 01/08/22 12:02 AM  Result Value Ref Range   Glucose-Capillary 177 (H) 70 - 99 mg/dL    Comment: Glucose reference range applies only to samples taken after fasting for at least 8 hours.  Basic metabolic panel     Status: Abnormal   Collection Time: 01/08/22  3:24 AM  Result Value Ref Range   Sodium 147 (H) 135 - 145 mmol/L   Potassium 4.3 3.5 - 5.1 mmol/L   Chloride 114 (H) 98 - 111 mmol/L   CO2 26 22 - 32 mmol/L   Glucose, Bld 156 (H) 70 - 99 mg/dL    Comment: Glucose reference range applies only to samples taken after fasting for at least 8 hours.   BUN 38 (H) 8 - 23 mg/dL   Creatinine, Ser 1.60 (H) 0.61 - 1.24 mg/dL   Calcium 8.4 (L) 8.9 - 10.3 mg/dL   GFR,  Estimated 41 (L) >60 mL/min    Comment: (NOTE) Calculated using the CKD-EPI Creatinine Equation (2021)    Anion gap 7 5 - 15    Comment: Performed at [facility], [place] [address], [place], [place] [address]  Magnesium     Status: None   Collection Time: 01/08/22  3:24 AM  Result Value Ref Range   Magnesium 2.0 1.7 - 2.4 mg/dL    Comment: Performed at [facility], [place] [address], [place], [place] [address]  Phosphorus     Status: None   Collection Time: 01/08/22  3:24 AM  Result Value Ref Range   Phosphorus 3.0 2.5 - 4.6 mg/dL    Comment: Performed at [facility], [place] [address] [place], [place] [address]  Glucose, capillary     Status: Abnormal   Collection Time: 01/08/22  4:17 AM  Result Value Ref Range   Glucose-Capillary 178 (H) 70 - 99 mg/dL    Comment: Glucose reference range applies only to samples taken after fasting for at least 8 hours.  Glucose, capillary     Status: Abnormal   Collection Time: 01/08/22  7:49 AM  Result Value Ref Range   Glucose-Capillary 144 (H) 70 - 99 mg/dL    Comment: Glucose reference range applies only to samples taken after fasting for at least 8 hours.  Glucose, capillary     Status: Abnormal   Collection Time: 01/08/22 11:55 AM  Result Value Ref Range   Glucose-Capillary 152 (H) 70 - 99 mg/dL    Comment: Glucose reference range applies only to samples taken after fasting for at least 8 hours.  Glucose, capillary     Status: Abnormal   Collection Time: 01/08/22  4:25 PM  Result Value Ref Range   Glucose-Capillary 146 (H) 70 - 99 mg/dL    Comment: Glucose reference range applies only to samples taken after fasting for at least 8 hours.  Glucose, capillary     Status: Abnormal   Collection Time: 01/08/22  8:22 PM  Result Value Ref Range   Glucose-Capillary 171 (H) 70 - 99 mg/dL    Comment: Glucose reference range applies only to samples taken after fasting for at least 8 hours.   Heparin level (unfractionated)     Status: Abnormal   Collection Time: 01/08/22  9:43 PM  Result Value Ref Range   Heparin Unfractionated <0.10 (L) 0.30 - 0.70 IU/mL    Comment: (NOTE) The clinical reportable range upper limit is being lowered to >1.10 to align with the FDA approved guidance for the current laboratory assay.  If heparin results  are below expected values, and patient dosage has  been confirmed, suggest follow up testing of antithrombin III levels. Performed at [facility], [place] [address] [place], [place] [address]   Glucose, capillary     Status: Abnormal   Collection Time: 01/09/22 12:10 AM  Result Value Ref Range   Glucose-Capillary 148 (H) 70 - 99 mg/dL    Comment: Glucose reference range applies only to samples taken after fasting for at least 8 hours.  Basic metabolic panel     Status: Abnormal   Collection Time: 01/09/22  5:06 AM  Result Value Ref Range   Sodium 142 135 - 145 mmol/L   Potassium 4.0 3.5 - 5.1 mmol/L   Chloride 112 (H) 98 - 111 mmol/L   CO2 25 22 - 32 mmol/L   Glucose, Bld 167 (H) 70 - 99 mg/dL    Comment: Glucose reference range applies only to samples taken after fasting for at least 8 hours.   BUN 36 (H) 8 - 23 mg/dL   Creatinine, Ser 1.36 (H) 0.61 - 1.24 mg/dL   Calcium 8.0 (L) 8.9 - 10.3 mg/dL   GFR, Estimated 49 (L) >60 mL/min    Comment: (NOTE) Calculated using the CKD-EPI Creatinine Equation (2021)    Anion gap 5 5 - 15    Comment: Performed at [facility], [place] [address] [place], [place] [address]  CBC     Status: Abnormal   Collection Time: 01/09/22  5:06 AM  Result Value Ref Range   WBC 5.1 4.0 - 10.5 K/uL   RBC 2.91 (L) 4.22 - 5.81 MIL/uL   Hemoglobin 9.0 (L) 13.0 - 17.0 g/dL   HCT 26.9 (L) 39.0 - 52.0 %   MCV 92.4 80.0 - 100.0 fL   MCH 30.9 26.0 - 34.0 pg   MCHC 33.5 30.0 - 36.0 g/dL   RDW 14.9 11.5 - 15.5 %   Platelets 159 150 - 400 K/uL   nRBC 0.0 0.0 - 0.2 %    Comment:  Performed at [facility], [place] [address] [place], [place] [address]  Brain natriuretic peptide     Status: Abnormal   Collection Time: 01/09/22  5:06 AM  Result Value Ref Range   B Natriuretic Peptide 482.5 (H) 0.0 - 100.0 pg/mL    Comment: Performed at [facility], [place] [address] [place], [place] [address]  Glucose, capillary     Status: Abnormal   Collection Time: 01/09/22  5:09 AM  Result Value Ref Range   Glucose-Capillary 170 (H) 70 - 99 mg/dL    Comment: Glucose reference range applies only to samples taken after fasting for at least 8 hours.  Glucose, capillary     Status: Abnormal   Collection Time: 01/09/22  8:00 AM  Result Value Ref Range   Glucose-Capillary 154 (H) 70 - 99 mg/dL    Comment: Glucose reference range applies only to samples taken after fasting for at least 8 hours.    ECG   N/A  Telemetry   Afib with CVR - Personally Reviewed  Radiology    DG UGI W SINGLE CM (SOL OR THIN BA)  Result Date: 01/07/2022 CLINICAL DATA:  Pneumoperitoneum in the setting of known gastric cancer on chemotherapy. Status post ex lap drainage of intra-abdominal abscess. EXAM: UPPER GI SERIES WITH KUB TECHNIQUE: A water-soluble contrast upper GI series was attempted. The examination was attempted by Rowe Robert, PA-C, and was supervised and interpreted by Dr.  Kellie Simmering. FLUOROSCOPY: Fluoroscopy time: 42 seconds (12.9 mGy). COMPARISON:  CTA abdomen/pelvis 12/31/2021. FINDINGS: Prior to the procedure, a scout radiograph of the abdomen was obtained. Bilateral surgical drains. Gastroduodenal bypass device. No dilated loops of small bowel. Gaseous distention of the colon. Degenerative changes of the spine and bilateral femoroacetabular joints. Subsequently, a water-soluble contrast upper GI series was attempted. However, the patient was unable to swallow the contrast and the examination was terminated. IMPRESSION: Attempted water-soluble contrast upper  GI series, as described. The patient was unable to swallow the contrast, and the examination was terminated. Electronically Signed   By: Kellie Simmering D.O.   On: 01/07/2022 12:57    Cardiac Studies   N/A  Assessment   Principal Problem:   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HCC) Active Problems:   Essential hypertension   Coronary artery disease   Atrial fibrillation (HCC)   Hyperlipidemia   Stage 3a chronic kidney disease (CKD) (HCC)   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HCC)   Acute on chronic systolic heart failure (HCC)   Plan   Remains net positive with LVEF 25-30%. He is volume overloaded and receiving too much IV fluid.  Will stop amiodarone gtts and transition to oral amiodarone 200 mg daily if he is able to take pills. Switch heparin to Eliquis 5 mg BID.  Creatinine improved yesterday to 1.36 with diuresis. Will continue lasix 40 mg IV daily. BNP elevated at 482.5.  D/w Dr. Olevia Bowens and surgery, would recommend family discussion regarding goals of care as he remains full code, but prognosis is poor given age and low LVEF.  Time Spent Directly with Patient:  I have spent a total of 35 minutes with the patient reviewing hospital notes, telemetry, EKGs, labs and examining the patient as well as establishing an assessment and plan that was discussed personally with the patient.  > 50% of time was spent in direct patient care.  Length of Stay:  LOS: 9 days   Pixie Casino, MD, [facility], [place] Director of the Advanced Lipid Disorders &  Cardiovascular Risk Reduction Clinic Diplomate of the American Board of Clinical Lipidology Attending Cardiologist  Direct Dial: [phone]  Fax: [phone]  Website:  [email or web address] 01/09/2022, 9:59 AM

## 2022-01-09 NOTE — Evaluation (Addendum)
Occupational Therapy Evaluation Patient Details Name: Dillon Smith MRN: [id number] DOB: March 25, 1930 Today's Date: 01/09/2022   History of Present Illness 88 yo male admitted with gastric perforation. S/P ex lap, drainage of abscess. Hx of gastric ca, ckd, bradycardia, covid, prostate ca, cva, afib.   Clinical Impression   PTA, pt lives alone, typically Modified Independent with ADLs, IADLs and mobility using cane. Pt reports being active at baseline and enjoys bowling. Pt presents now with deficits in cognition, strength, endurance, and sitting/standing balance. Pt very pleasant, participatory with noted deficits in motor planning/initiation of tasks. Overall, pt requires Total A x 2 for bed mobility and unsuccessful sit to stand attempts in [place] this AM. Pt requires varying levels of assist to maintain sitting balance, Mod A for UB ADL and Total A for LB ADLs d/t deficits. Based on high PLOF, potential for progress and known family support at DC, recommend consideration of AIR level therapies pending activity tolerance improvements. If endurance does not improve within reasonable time, will need to consider other postacute rehab venues.    Recommendations for follow up therapy are one component of a multi-disciplinary discharge planning process, led by the attending physician.  Recommendations may be updated based on patient status, additional functional criteria and insurance authorization.   Follow Up Recommendations  Acute inpatient rehab (3hours/day) (pending activity tolerance improvements)    Assistance Recommended at Discharge Frequent or constant Supervision/Assistance  Patient can return home with the following Two people to help with walking and/or transfers;Two people to help with bathing/dressing/bathroom;Assistance with feeding    Functional Status Assessment  Patient has had a recent decline in their functional status and demonstrates the ability to make significant  improvements in function in a reasonable and predictable amount of time.  Equipment Recommendations  Other (comment) (TBD pending progress)    Recommendations for Other Services       Precautions / Restrictions Precautions Precautions: Fall Precaution Comments: multiple lines, (2) jp drains, abd wound; incontinent Restrictions Weight Bearing Restrictions: No      Mobility Bed Mobility Overal bed mobility: Needs Assistance Bed Mobility: Supine to Sit, Sit to Supine     Supine to sit: Total assist, +2 for physical assistance, HOB elevated, +2 for safety/equipment Sit to supine: Total assist, +2 for physical assistance, +2 for safety/equipment, HOB elevated   General bed mobility comments: due to difficulty initiating and sequencing, pt requires Total A x 2 for all bed mobility    Transfers Overall transfer level: Needs assistance Equipment used: Ambulation equipment used Transfers: Sit to/from Stand Sit to Stand: Total assist, +2 physical assistance, +2 safety/equipment, From elevated surface           General transfer comment: Total A x 2 for sit to stand trial in [place]. with cues, pt able to reach to [facility] bars and attempt to pull self up though tendency to scoot forward EOB rather than lift trunk upwards posing safety hazard. able to briefly clear bottom but could not stand fully in [place] today      Balance Overall balance assessment: Needs assistance Sitting-balance support: No upper extremity supported, Feet supported Sitting balance-Leahy Scale: Poor Sitting balance - Comments: initially Max A for sitting balance progressing to Min A and briefly min guard with increased time. With cues for posture and correction, pt able to demo attempts to correct Postural control: Right lateral lean, Posterior lean Standing balance support: Bilateral upper extremity supported, During functional activity, Reliant on assistive device for balance Standing balance-Leahy Scale:  Zero                              ADL either performed or assessed with clinical judgement   ADL Overall ADL's : Needs assistance/impaired Eating/Feeding: Supervision/ safety;Sitting Eating/Feeding Details (indicate cue type and reason): initially required assist to open containers and initiate self feeding. once spoon and jello cup placed in hand, pt able to begin self feeding without assist Grooming: Minimal assistance;Sitting;Wash/dry face Grooming Details (indicate cue type and reason): assist for balance sititng EOB, initiation of task and cues for continuation of task Upper Body Bathing: Moderate assistance;Sitting   Lower Body Bathing: Total assistance;Bed level;Sitting/lateral leans   Upper Body Dressing : Moderate assistance;Sitting   Lower Body Dressing: Total assistance;Bed level       Toileting- Clothing Manipulation and Hygiene: Total assistance;Bed level         General ADL Comments: Fair UB strength though poor motor planning/initiation and significant trunk/LB weakness requiring extensive assist for LB ADLs and mobility attempts     Vision Ability to See in Adequate Light: 0 Adequate Patient Visual Report: No change from baseline Vision Assessment?: No apparent visual deficits     Perception     Praxis      Pertinent Vitals/Pain Pain Assessment Pain Assessment: Faces Faces Pain Scale: Hurts little more Pain Location: abdomen, generalized Pain Descriptors / Indicators: Grimacing, Guarding, Moaning Pain Intervention(s): Monitored during session     Hand Dominance Right   Extremity/Trunk Assessment Upper Extremity Assessment Upper Extremity Assessment: Overall WFL for tasks assessed   Lower Extremity Assessment Lower Extremity Assessment: Defer to PT evaluation   Cervical / Trunk Assessment Cervical / Trunk Assessment: Normal   Communication Communication Communication: HOH   Cognition Arousal/Alertness: Awake/alert, Lethargic, Suspect due  to medications (intermittently falling asleep but awakens easily) Behavior During Therapy: WFL for tasks assessed/performed Overall Cognitive Status: Impaired/Different from baseline Area of Impairment: Memory, Problem solving, Following commands, Attention, Safety/judgement, Awareness                   Current Attention Level: Selective Memory: Decreased short-term memory Following Commands: Follows one step commands inconsistently, Follows one step commands with increased time Safety/Judgement: Decreased awareness of deficits, Decreased awareness of safety Awareness: Intellectual Problem Solving: Slow processing, Decreased initiation, Requires verbal cues, Requires tactile cues, Difficulty sequencing General Comments: intermittently falling asleep (likely d/t meds) but easily awakens. very pleasant, willing to participate though max multimodal cues needed with difficulty initiating tasks     General Comments       Exercises     Shoulder Instructions      Home Living Family/patient expects to be discharged to:: Private residence Living Arrangements: Alone Available Help at Discharge: Family;Available 24 hours/day (daughter reports she works from home out of state but can stay with pt to assist as needed) Type of Home: Apartment Home Access: Stairs to enter [employer] of Steps: 3 Entrance Stairs-Rails: Right Home Layout: One [place]: [place] - single point          Prior Functioning/Environment Prior Level of Function : Independent/Modified Independent;Driving             Mobility Comments: ambulatory with a cane most recently. ADLs Comments: mod ind. still drives, reports enjoys bowling. 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 - would have to confirm  OT Problem List: Decreased strength;Decreased activity tolerance;Impaired balance (sitting and/or standing);Decreased cognition;Decreased coordination;Decreased safety  awareness;Decreased knowledge of use of DME or AE;Decreased knowledge of precautions;Pain      OT Treatment/Interventions: Self-care/ADL training;Therapeutic exercise;Energy conservation;DME and/or AE instruction;Therapeutic activities;Patient/family education;Balance training    OT Goals(Current goals can be found in the care plan section) Acute Rehab OT Goals Patient Stated Goal: agreeable to sit EOB, eat some food OT Goal Formulation: With patient Time For Goal Achievement: 01/23/22 Potential to Achieve Goals: Good  OT Frequency: Min 2X/week    Co-evaluation PT/OT/SLP Co-Evaluation/Treatment: Yes Reason for Co-Treatment: To address functional/ADL transfers;For patient/therapist safety   OT goals addressed during session: ADL's and self-care      AM-PAC OT "6 Clicks" Daily Activity     Outcome Measure Help from another person eating meals?: A Little Help from another person taking care of personal grooming?: A Little Help from another person toileting, which includes using toliet, bedpan, or urinal?: Total Help from another person bathing (including washing, rinsing, drying)?: A Lot Help from another person to put on and taking off regular upper body clothing?: A Lot Help from another person to put on and taking off regular lower body clothing?: A Lot 6 Click Score: 13   End of Session Nurse Communication: Mobility status  Activity Tolerance: Patient tolerated treatment well Patient left: in bed;with call bell/phone within reach;with bed alarm set  OT Visit Diagnosis: Unsteadiness on feet (R26.81);Other abnormalities of gait and mobility (R26.89)                Time: 8502-7741 OT Time Calculation (min): 29 min Charges:  OT General Charges $OT Visit: 1 Visit OT Evaluation $OT Eval Moderate Complexity: 1 Mod  Malachy Chamber, OTR/L Acute Rehab Services Office: [phone]   Layla Maw 01/09/2022, 10:26 AM

## 2022-01-09 NOTE — Progress Notes (Signed)
Speech Language Pathology Treatment: Dysphagia  Patient Details Name: Dillon Smith MRN: [id number] DOB: 11/29/1929 Today's Date: 01/09/2022 Time: 1610-1630 SLP Time Calculation (min) (ACUTE ONLY): 20 min  Assessment / Plan / Recommendation Clinical Impression  Pt seen at bedside to assess readiness to advance to full liquids per surgery. RN reports no obvious difficulties with clear liquid diet. Pt is afebrile. Chart review indicates lungs are CTA, however, RN reported rhonchi in lower lobes. Pt was awake and alert, upright in bed. No family present at this time. Pt continues to exhibit frequent throat clearing, even in the absence of PO trials. Pt appeared to be more hard of hearing today - hearing aid in place, but may need attention/charging.  Pt accepted trials of thin liquid, puree, and solid texture. Extended oral prep noted with cracker, likely due to having only 3 teeth (dentures at home per pt). Multiple consecutive boluses of thin liquid were tolerated without reflexive cough. Puree and cracker also exhibited no overt s/s aspiration, however, silent aspiration cannot be detected at bedside.   Will advance diet to full liquids/thin liquids. Safe swallow precautions posted at [facility]. SLP will follow to assess tolerance of advanced textures.   HPI HPI: 88yo male admitted 12/31/21 with abdominal pain. PMH: HTN, prostate cancer, HLD, AFib, CKD3, CVA, CAD s/p CABG, current gastric adenocarcinoma undergoing chemotherapy.      SLP Plan  Continue with current plan of care      Recommendations for follow up therapy are one component of a multi-disciplinary discharge planning process, led by the attending physician.  Recommendations may be updated based on patient status, additional functional criteria and insurance authorization.    Recommendations  Diet recommendations: Other(comment) (full liquid) Liquids provided via: Cup;Straw Medication Administration: Whole meds with  liquid Supervision: Patient able to self feed;Staff to assist with self feeding;Intermittent supervision to cue for compensatory strategies Compensations: Minimize environmental distractions;Slow rate;Small sips/bites Postural Changes and/or Swallow Maneuvers: Seated upright 90 degrees;Upright 30-60 min after meal                Oral Care Recommendations: Oral care BID Follow Up Recommendations: Follow physician's recommendations for discharge plan and follow up therapies Assistance recommended at discharge: Intermittent Supervision/Assistance SLP Visit Diagnosis: Dysphagia, unspecified (R13.10) Plan: Continue with current plan of care          Cynithia Hakimi B. Quentin Ore, [facility], [place] Speech Language Pathologist Office: [phone]  Shonna Chock 01/09/2022, 4:34 PM

## 2022-01-09 NOTE — Progress Notes (Signed)
PHARMACY - TOTAL PARENTERAL NUTRITION CONSULT NOTE   Indication: Prolonged ileus Pneumoperitoneum in the setting of known gastric cancer on chemotherapy s/p ex lap drainage of intra-abdominal abscess  Patient Measurements: Height: '5\' 11"'$  (180.3 cm) Weight: 89.6 kg (197 lb 8.5 oz) IBW/kg (Calculated) : 75.3 TPN AdjBW (KG): 84.7 Body mass index is 27.55 kg/m.  Assessment: 88 yo male with perforated gastric cancer on chemotherapy s/p ex lap with intraabdominal abscess drainage 7/3. PMH includes Afib on eliquis, CKDIII, HTN, prostate cancer, CVA, and HLD. Pharmacy consulted to start TPN for possible prolonged ileus.  Glucose / Insulin: CBGs <180.   - 9 units SSI used in past 24hr Electrolytes: Na and Corrected Calcium improved to WNL after removing Na and Ca from TPN. Cl elevated. Mg 1.7, trending down after removing Mg from TPN for several days.  Renal: AKI on CKDIII. SCr improved to 1.36. BUN 36.  Hepatic: AST/ALT improved to WNL (7/10), Tbili WNL (7/10), Alb low 1.7, TG 52 (7/10) Intake / Output: + 1551m last 24h. Drains 355m24h. UOP 2.3L (received IV furosemide '40mg'$  x 1 on 7/11).  MIVF: stopped per Cardiology  GI Imaging: 7/3 CT intra-abdominal free air which was concerning for bowel perforation GI Surgeries / Procedures:  7/3 ex lap, drainage of intraabdominal abscess  Central access: PICC 7/7, Implanted port TPN start date: 7/7  Nutritional Goals: Goal TPN rate is 75 mL/hr (provides 124 g of protein and 2344 kcals per day) - reformulated on 7/12 to decrease total volume  RD Assessment: Estimated Needs Total Energy Estimated Needs: 2300-2500 kcal Total Protein Estimated Needs: 110-125 grams Total Fluid Estimated Needs: >/= 2.3 L/day  Current Nutrition:  Clear liquid diet, TPN  Plan:  Noted concern for volume overload and several infusions changed to PO medications. Daily IV lasix started per Cardiology. Per discussion with CCS, to continue full rate TPN for now. Will  reformulate TPN today to decrease total volume of TPN and still meet nutritional goals (adjust amino acid component to more concentrated formulation)  At 1800, TPN at goal rate will be 7533mr  Electrolytes in TPN:  Na 10 mEq/L (increase) K 39m39m Ca 0 mEq/L Mg 5mEq68m(increase) Phos 15mmo4mCl:Ac max acetate Add standard MVI and trace elements to TPN Change to Moderate q4h SSI and adjust as needed Monitor TPN labs on Mon/Thurs and PRN Follow up for advancement of diet and ability to wean TPN   Tannon Peerson Lindell SparmD, BCPS Clinical Pharmacist 01/09/2022 10:52 AM

## 2022-01-09 NOTE — Progress Notes (Addendum)
[facility] Consult PROGRESS NOTE    Progress Note  Dillon Smith  XBJ:[id number] DOB: 11/29/1929 DOA: 12/31/2021 PCP: [name]     Brief Narrative:   Dillon Smith is an 88 y.o. male past medical history of chronic kidney disease stage III, paroxysmal atrial fibrillation on anticoagulation at home, retention history of gastric cancer was admitted by the surgical service as work-up revealed pneumoperitoneum due to gastric perforation was taken to the OR about 8 days prior to the initial consult for exploratory laparotomy and repair, pulmonary and critical care was managing as he has been transferred to the floor General surgery was consulted tried for medical assistance.   Assessment/Plan:   Acute kidney injury on chronic kidney disease stage IIIa: Baseline creatinine around 1.1-1.3, today is 1.3. He is being diuresed by cardiology continue to monitor strict I's and O's. We will continue to follow at a distance.  Essential hypertension: Receiving Lasix intermittently, blood pressure seems to be well controlled.  Hyperlipidemia: Can resume statins once able to take orals.  Paroxysmal atrial fibrillation: Cardiology has been consulted, the patient is currently on amiodarone and heparin drip.  Pneumoperitoneum in the setting of gastric cancer perforation on chemotherapy: Status post surgical intervention with exploratory laparotomy and intra-abdominal abscesses about 8 days ago. Patient pulled out his NG on 01/06/2022 They were unable to replace the NG tube due to inability to swallow. P.o. trials were started on 01/07/2022 evaluated by speech. He has had no signs of enteric leak. Continue diet per surgery.  Continue TPN. Antibiotics have been stopped. Due to fentanyl patch and hold oral hydromorphone for pain control he relates he seems to be working. Patient has an extremely poor prognoses. He would be a good idea to have end-of-life conversation with  family as his prognosis is extremely poor.  Diabetes mellitus type II: Currently on TNA and liquid diet. Well-controlled sliding scale.  Goals of care/ethics: There will be a good idea for family to start discussing with palliative care to move towards comfort care due to his poor prognosis.    DVT prophylaxis: lovenox Family Communication:none Status is: Inpatient Remains inpatient appropriate because: Acute perforated viscus    Code Status:     Code Status Orders  (From admission, onward)           Start     Ordered   12/31/21 2012  Full code  Continuous        12/31/21 2011           Code Status History     Date Active Date Inactive Code Status Order ID Comments User Context   12/31/2021 1440 12/31/2021 2012 DNR [id number]  Garald Balding, PA-C ED   08/19/2019 0131 08/21/2019 1831 Full Code [id number]  Vianne Bulls, MD ED   08/15/2019 2100 08/17/2019 2210 Full Code [id number]  Amie Portland, MD ED      Advance Directive Documentation    Speed Most Recent Value  Type of Advance Directive Living will  Pre-existing out of facility DNR order (yellow form or pink MOST form) --  "MOST" Form in Place? --         IV Access:   Peripheral IV   Procedures and diagnostic studies:   DG UGI W SINGLE CM (SOL OR THIN BA)  Result Date: 01/07/2022 CLINICAL DATA:  Pneumoperitoneum in the setting of known gastric cancer on chemotherapy. Status post ex lap drainage of intra-abdominal abscess. EXAM: UPPER GI SERIES WITH KUB TECHNIQUE:  A water-soluble contrast upper GI series was attempted. The examination was attempted by Rowe Robert, PA-C, and was supervised and interpreted by Dr. Kellie Simmering. FLUOROSCOPY: Fluoroscopy time: 42 seconds (12.9 mGy). COMPARISON:  CTA abdomen/pelvis 12/31/2021. FINDINGS: Prior to the procedure, a scout radiograph of the abdomen was obtained. Bilateral surgical drains. Gastroduodenal bypass device. No dilated loops of small bowel. Gaseous  distention of the colon. Degenerative changes of the spine and bilateral femoroacetabular joints. Subsequently, a water-soluble contrast upper GI series was attempted. However, the patient was unable to swallow the contrast and the examination was terminated. IMPRESSION: Attempted water-soluble contrast upper GI series, as described. The patient was unable to swallow the contrast, and the examination was terminated. Electronically Signed   By: Kellie Simmering D.O.   On: 01/07/2022 12:57     Medical Consultants:   None.   Subjective:    Dillon Smith relates his pain is controlled.  Objective:    Vitals:   01/08/22 1430 01/08/22 1525 01/08/22 2025 01/09/22 0523  BP: 128/82 130/82 (!) 148/83 128/66  Pulse:   80 64  Resp:   18 18  Temp: 98.4 F (36.9 C)  98.9 F (37.2 C) (!) 97.4 F (36.3 C)  TempSrc:    Axillary  SpO2:   100% 100%  Weight:      Height:       SpO2: 100 % O2 Flow Rate (L/min): 0 L/min FiO2 (%): (!) 0 %   Intake/Output Summary (Last 24 hours) at 01/09/2022 0924 Last data filed at 01/09/2022 0741 Gross per 24 hour  Intake 4323.42 ml  Output 1985 ml  Net 2338.42 ml   Filed Weights   12/31/21 2015 01/04/22 0908  Weight: 84.7 kg 89.6 kg    Exam: General exam: In no acute distress. Respiratory system: Good air movement and clear to auscultation. Cardiovascular system: S1 & S2 heard, RRR. No JVD. Gastrointestinal system: Abdomen is nondistended, soft and nontender.  Extremities: No pedal edema. Skin: No rashes, lesions or ulcers Psychiatry: Judgement and insight appear normal. Mood & affect appropriate.   Data Reviewed:    Labs: Basic Metabolic Panel: Recent Labs  Lab 01/04/22 0300 01/05/22 0813 01/06/22 0510 01/07/22 0430 01/08/22 0324 01/09/22 0506  NA 144 145 149* 148* 147* 142  K 3.7 3.7 3.7 4.2 4.3 4.0  CL 113* 114* 117* 117* 114* 112*  CO2 '25 24 26 26 26 25  '$ GLUCOSE 130* 180* 171* 150* 156* 167*  BUN 44* 40* 37* 38* 38* 36*   CREATININE 2.31* 1.76* 1.79* 1.50* 1.60* 1.36*  CALCIUM 8.3* 8.3* 8.7* 8.7* 8.4* 8.0*  MG 2.6* 2.4 2.3 2.1 2.0  --   PHOS 3.3 2.7 2.6 2.8 3.0  --    GFR Estimated Creatinine Clearance: 38.4 mL/min (A) (by C-G formula based on SCr of 1.36 mg/dL (H)). Liver Function Tests: Recent Labs  Lab 01/05/22 0813 01/06/22 0510 01/07/22 0430  AST 44* 34 30  ALT 48* 41 36  ALKPHOS 95 89 84  BILITOT 0.5 0.7 0.7  PROT 5.3* 5.6* 5.6*  ALBUMIN 1.8* 1.9* 1.8*   No results for input(s): "LIPASE", "AMYLASE" in the last 168 hours. No results for input(s): "AMMONIA" in the last 168 hours. Coagulation profile No results for input(s): "INR", "PROTIME" in the last 168 hours. COVID-19 Labs  No results for input(s): "DDIMER", "FERRITIN", "LDH", "CRP" in the last 72 hours.  Lab Results  Component Value Date   SARSCOV2NAA POSITIVE (A) 08/15/2019    CBC: Recent Labs  Lab 01/04/22 0300 01/05/22 0529 01/06/22 0510 01/07/22 0430 01/09/22 0506  WBC 8.9 5.2 5.9 5.7 5.1  HGB 9.5* 9.3* 10.2* 9.6* 9.0*  HCT 27.8* 28.3* 30.1* 28.4* 26.9*  MCV 89.4 99.0 90.7 91.3 92.4  PLT 86* 67* 68* 103* 159   Cardiac Enzymes: No results for input(s): "CKTOTAL", "CKMB", "CKMBINDEX", "TROPONINI" in the last 168 hours. BNP (last 3 results) No results for input(s): "PROBNP" in the last 8760 hours. CBG: Recent Labs  Lab 01/08/22 1625 01/08/22 2022 01/09/22 0010 01/09/22 0509 01/09/22 0800  GLUCAP 146* 171* 148* 170* 154*   D-Dimer: No results for input(s): "DDIMER" in the last 72 hours. Hgb A1c: No results for input(s): "HGBA1C" in the last 72 hours. Lipid Profile: Recent Labs    01/07/22 0430  TRIG 52   Thyroid function studies: No results for input(s): "TSH", "T4TOTAL", "T3FREE", "THYROIDAB" in the last 72 hours.  Invalid input(s): "FREET3" Anemia work up: No results for input(s): "VITAMINB12", "FOLATE", "FERRITIN", "TIBC", "IRON", "RETICCTPCT" in the last 72 hours. Sepsis Labs: Recent Labs   Lab 01/05/22 0529 01/06/22 0510 01/07/22 0430 01/09/22 0506  WBC 5.2 5.9 5.7 5.1   Microbiology Recent Results (from the past 240 hour(s))  Blood culture (routine x 2)     Status: Abnormal   Collection Time: 12/31/21 11:21 AM   Specimen: BLOOD  Result Value Ref Range Status   Specimen Description   Final    BLOOD RIGHT ANTECUBITAL Performed at [facility] [address] [place], [place] [address]    Special Requests   Final    BOTTLES DRAWN AEROBIC AND ANAEROBIC Blood Culture adequate volume Performed at [facility] [address], [place], [place] [address]    Culture  Setup Time   Final    GRAM NEGATIVE RODS ANAEROBIC BOTTLE ONLY CRITICAL VALUE NOTED.  VALUE IS CONSISTENT WITH PREVIOUSLY REPORTED AND CALLED VALUE.    Culture (A)  Final    BACTEROIDES FRAGILIS SUSCEPTIBILITIES PERFORMED ON PREVIOUS CULTURE WITHIN THE LAST 5 DAYS. Performed at [facility], [place] [address] [place], [place] [address]    Report Status 01/06/2022 FINAL  Final  Culture, blood (Routine X 2) w Reflex to ID Panel     Status: Abnormal   Collection Time: 12/31/21 12:15 PM   Specimen: BLOOD  Result Value Ref Range Status   Specimen Description   Final    BLOOD LEFT ANTECUBITAL Performed at [facility] [address] [place], [place] [address]    Special Requests   Final    BOTTLES DRAWN AEROBIC AND ANAEROBIC Blood Culture adequate volume Performed at [facility] [address], [place], [place] [address]    Culture  Setup Time   Final    GRAM NEGATIVE RODS ANAEROBIC BOTTLE ONLY CRITICAL RESULT CALLED TO, READ BACK BY AND VERIFIED WITH: PHARMD MARY S. [id number] [phone] FCP    Culture (A)  Final    BACTEROIDES FRAGILIS BETA LACTAMASE POSITIVE Performed at [facility], [place] [address], [place],  [address]    Report Status 01/04/2022 FINAL  Final  Blood Culture ID Panel (Reflexed)     Status:  Abnormal   Collection Time: 12/31/21 12:15 PM  Result Value Ref Range Status   Enterococcus faecalis NOT DETECTED NOT DETECTED Final   Enterococcus Faecium NOT DETECTED NOT DETECTED Final   Listeria monocytogenes NOT DETECTED NOT DETECTED Final   Staphylococcus species NOT DETECTED NOT DETECTED Final  Staphylococcus aureus (BCID) NOT DETECTED NOT DETECTED Final   Staphylococcus epidermidis NOT DETECTED NOT DETECTED Final   Staphylococcus lugdunensis NOT DETECTED NOT DETECTED Final   Streptococcus species NOT DETECTED NOT DETECTED Final   Streptococcus agalactiae NOT DETECTED NOT DETECTED Final   Streptococcus pneumoniae NOT DETECTED NOT DETECTED Final   Streptococcus pyogenes NOT DETECTED NOT DETECTED Final   A.calcoaceticus-baumannii NOT DETECTED NOT DETECTED Final   Bacteroides fragilis DETECTED (A) NOT DETECTED Final    Comment: CRITICAL RESULT CALLED TO, READ BACK BY AND VERIFIED WITH: PHARMD MARY S. [phone] FCP    Enterobacterales NOT DETECTED NOT DETECTED Final   Enterobacter cloacae complex NOT DETECTED NOT DETECTED Final   Escherichia coli NOT DETECTED NOT DETECTED Final   Klebsiella aerogenes NOT DETECTED NOT DETECTED Final   Klebsiella oxytoca NOT DETECTED NOT DETECTED Final   Klebsiella pneumoniae NOT DETECTED NOT DETECTED Final   Proteus species NOT DETECTED NOT DETECTED Final   Salmonella species NOT DETECTED NOT DETECTED Final   Serratia marcescens NOT DETECTED NOT DETECTED Final   Haemophilus influenzae NOT DETECTED NOT DETECTED Final   Neisseria meningitidis NOT DETECTED NOT DETECTED Final   Pseudomonas aeruginosa NOT DETECTED NOT DETECTED Final   Stenotrophomonas maltophilia NOT DETECTED NOT DETECTED Final   Candida albicans NOT DETECTED NOT DETECTED Final   Candida auris NOT DETECTED NOT DETECTED Final   Candida glabrata NOT DETECTED NOT DETECTED Final   Candida krusei NOT DETECTED NOT DETECTED Final   Candida parapsilosis NOT DETECTED NOT DETECTED Final    Candida tropicalis NOT DETECTED NOT DETECTED Final   Cryptococcus neoformans/gattii NOT DETECTED NOT DETECTED Final    Comment: Performed at [facility], [address] [place], [place] [address]  MRSA Next Gen by PCR, Nasal     Status: None   Collection Time: 12/31/21  8:13 PM   Specimen: Nasal Mucosa; Nasal Swab  Result Value Ref Range Status   MRSA by PCR Next Gen NOT DETECTED NOT DETECTED Final    Comment: (NOTE) The GeneXpert MRSA Assay (FDA approved for NASAL specimens only), is one component of a comprehensive MRSA colonization surveillance program. It is not intended to diagnose MRSA infection nor to guide or monitor treatment for MRSA infections. Test performance is not FDA approved in patients less than 47 years old. Performed at [facility], [place] [address] [place], [place] [address]      Medications:    Chlorhexidine Gluconate Cloth  6 each Topical Daily   diphenhydrAMINE  25 mg Intravenous QHS   fentaNYL  1 patch Transdermal Q72H   insulin aspart  0-9 Units Subcutaneous Q4H   pantoprazole (PROTONIX) IV  40 mg Intravenous QHS   sodium chloride flush  10-40 mL Intracatheter Q12H   thiamine injection  100 mg Intravenous Daily   Continuous Infusions:  sodium chloride Stopped (01/04/22 1748)   sodium chloride Stopped (01/05/22 1124)   amiodarone 30 mg/hr (01/08/22 2300)   heparin 1,500 Units/hr (01/09/22 0427)   TPN ADULT (ION) 90 mL/hr at 01/08/22 1735      LOS: 9 days   Charlynne Cousins  [facility]  01/09/2022, 9:24 AM

## 2022-01-09 NOTE — TOC Progression Note (Signed)
Transition of Care [facility]) - Progression Note    Patient Details  Name: Dillon Smith MRN: [id number] Date of Birth: 11/29/1929  [facility]) CM/SW Contact  Mcdonald Reiling, Juliann Pulse, RN Phone Number: 01/09/2022, 3:54 PM  Clinical Narrative:  Await CIR eval.Noted SNF w/Palliative Care Services.     Expected Discharge Plan:  (TBD) Barriers to Discharge: Continued Medical Work up  Expected Discharge Plan and Services Expected Discharge Plan:  (TBD)   Discharge Planning Services: CM Consult   Living arrangements for the past 2 months: Single Family Home                                       Social Determinants of Health (SDOH) Interventions    Readmission Risk Interventions     No data to display

## 2022-01-10 ENCOUNTER — Inpatient Hospital Stay: Payer: Medicare Other

## 2022-01-10 ENCOUNTER — Inpatient Hospital Stay: Payer: Medicare Other | Admitting: Hematology

## 2022-01-10 DIAGNOSIS — K255 Chronic or unspecified gastric ulcer with perforation: Secondary | ICD-10-CM | POA: Diagnosis not present

## 2022-01-10 DIAGNOSIS — I2510 Atherosclerotic heart disease of native coronary artery without angina pectoris: Secondary | ICD-10-CM | POA: Diagnosis not present

## 2022-01-10 DIAGNOSIS — I4811 Longstanding persistent atrial fibrillation: Secondary | ICD-10-CM | POA: Diagnosis not present

## 2022-01-10 DIAGNOSIS — I5023 Acute on chronic systolic (congestive) heart failure: Secondary | ICD-10-CM | POA: Diagnosis not present

## 2022-01-10 LAB — COMPREHENSIVE METABOLIC PANEL
ALT: 40 U/L (ref 0–44)
AST: 38 U/L (ref 15–41)
Albumin: 1.6 g/dL — ABNORMAL LOW (ref 3.5–5.0)
Alkaline Phosphatase: 71 U/L (ref 38–126)
Anion gap: 5 (ref 5–15)
BUN: 39 mg/dL — ABNORMAL HIGH (ref 8–23)
CO2: 26 mmol/L (ref 22–32)
Calcium: 8.3 mg/dL — ABNORMAL LOW (ref 8.9–10.3)
Chloride: 111 mmol/L (ref 98–111)
Creatinine, Ser: 1.45 mg/dL — ABNORMAL HIGH (ref 0.61–1.24)
GFR, Estimated: 46 mL/min — ABNORMAL LOW (ref >60)
Glucose, Bld: 123 mg/dL — ABNORMAL HIGH (ref 70–99)
Potassium: 4.1 mmol/L (ref 3.5–5.1)
Sodium: 142 mmol/L (ref 135–145)
Total Bilirubin: 0.8 mg/dL (ref 0.3–1.2)
Total Protein: 6.5 g/dL (ref 6.5–8.1)

## 2022-01-10 LAB — CBC
HCT: 26.2 % — ABNORMAL LOW (ref 39.0–52.0)
Hemoglobin: 8.7 g/dL — ABNORMAL LOW (ref 13.0–17.0)
MCH: 30.3 pg (ref 26.0–34.0)
MCHC: 33.2 g/dL (ref 30.0–36.0)
MCV: 91.3 fL (ref 80.0–100.0)
Platelets: 190 10*3/uL (ref 150–400)
RBC: 2.87 MIL/uL — ABNORMAL LOW (ref 4.22–5.81)
RDW: 15.1 % (ref 11.5–15.5)
WBC: 4.6 10*3/uL (ref 4.0–10.5)
nRBC: 0 % (ref 0.0–0.2)

## 2022-01-10 LAB — GLUCOSE, CAPILLARY
Glucose-Capillary: 121 mg/dL — ABNORMAL HIGH (ref 70–99)
Glucose-Capillary: 124 mg/dL — ABNORMAL HIGH (ref 70–99)
Glucose-Capillary: 129 mg/dL — ABNORMAL HIGH (ref 70–99)
Glucose-Capillary: 134 mg/dL — ABNORMAL HIGH (ref 70–99)
Glucose-Capillary: 140 mg/dL — ABNORMAL HIGH (ref 70–99)
Glucose-Capillary: 148 mg/dL — ABNORMAL HIGH (ref 70–99)
Glucose-Capillary: 148 mg/dL — ABNORMAL HIGH (ref 70–99)

## 2022-01-10 LAB — TRIGLYCERIDES: Triglycerides: 69 mg/dL (ref <150)

## 2022-01-10 LAB — PHOSPHORUS: Phosphorus: 3.5 mg/dL (ref 2.5–4.6)

## 2022-01-10 LAB — MAGNESIUM: Magnesium: 1.8 mg/dL (ref 1.7–2.4)

## 2022-01-10 MED ORDER — ENSURE ENLIVE PO LIQD
237.0000 mL | Freq: Three times a day (TID) | ORAL | Status: DC
  Administered 2022-01-10 – 2022-01-21 (×28): 237 mL via ORAL

## 2022-01-10 MED ORDER — TRACE MINERALS CU-MN-SE-ZN 300-55-60-3000 MCG/ML IV SOLN
INTRAVENOUS | Status: AC
  Filled 2022-01-10: qty 828

## 2022-01-10 MED ORDER — FUROSEMIDE 10 MG/ML IJ SOLN
40.0000 mg | Freq: Two times a day (BID) | INTRAMUSCULAR | Status: DC
  Administered 2022-01-10 – 2022-01-11 (×2): 40 mg via INTRAVENOUS
  Filled 2022-01-10 (×2): qty 4

## 2022-01-10 NOTE — Progress Notes (Deleted)
[place]   [phone] [phone]   Clinic Follow up Note   Patient Care Team: [name] as PCP - General [name], MD as PCP - Cardiology (Cardiology) Thompson Grayer, MD as PCP - Electrophysiology (Cardiology) [name], MD as Consulting Physician (Radiation Oncology) [name], RN as Registered Nurse [name], MD as Consulting Physician (Hematology) [name], NP as Nurse Practitioner (Nurse Practitioner)  Date of Service:  01/10/2022  CHIEF COMPLAINT: f/u of gastric cancer  CURRENT THERAPY:  Neoadjuvant FOLFOX, q14d, started 11/29/21  ASSESSMENT & PLAN:  Dillon Smith is a 88 y.o. male with   1. Adenocarcinoma of the pylorus/distal stomach, cT3N0M0, stage IIB, MMR normal, PD-L1 CPS 2% -presented with reported 30 pound weight loss over a year, and 2-week history of GERD, constipation, and regurgitation. CT AP w/o contrast at the [facility] on 09/12/21 showed pyloric thickening. Biopsy from pyloric channel obtained during EGD on 09/14/21 confirmed adenocarcinoma, MMR was normal. Baseline CEA and CA19.9 normal  -Due to gastric outlet obstruction he underwent EGD/EUS with pylori stent placement on 09/21/21 and his symptoms completely resolved  -He was referred from [place] to [name] for palliative radiation. Staging CT 09/21/21 and PET scan 10/18/21 were negative for lymphadenopathy or distant metastasis -staging EUS 11/22/21 showed T3 N0 disease  -He has met general surgeon Dr. [name], who thinks he is a candidate for surgical resection. -he was started on neoadjuvant FOLFOX on 11/29/21. He tolerates well overall with taste changes. -his recent CT from ED visit 7/3 showed stable disease  2. Chemo Toxicities: Loss of Taste -he is not eating well due to taste loss. We gave him an Ensure today and advised him to use at home. -Follow-up with dietitian   3. GERD, constipation, regurgitation, gastric outlet obstruction, weight  loss -Secondary to #1 -much improved with gastrojejunostomy  4. Comorbidities: HTN, HL, A-fib, history of stroke, prostate cancer 2004 s/p brachytherapy, CKD stage III  -Currently on amlodipine, apixaban, and atorvastatin. Denies bleeding on anticoagulation  -He has no residual deficits from previous stroke -His chronic conditions appear to be well controlled -Last PSA was normal per patient, checked at the [place] -He has CKD and mild anemia, will monitor closely on chemo.    4. Social -Patient is single with 2 adult daughters who live out of state.  He is a high functioning gentleman who lives alone and is independent with ADLs including driving -His neighbor/friend checks on him and helps with medication -His niece brings him to his appointments and helps care for him.   5. Goals of care -he is full code      PLAN: -proceed with C3 FOLFOX today with reduced oxali dose to 41m/m2 due to constipation and taste change etc  -pick up enema from pharmacy and increase miralax dose -lab, flush, f/u, and FOLFOX every 2 weeks             -he prefers very early morning appointments -I called and spoke with his niece after his visit  -nutrition consult and start taking Ensure 3 bottles a day    No problem-specific Assessment & Plan notes found for this encounter.   SUMMARY OF ONCOLOGIC HISTORY: Oncology History  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[place]  09/14/2021 Initial Biopsy   A. pyloric channel biopsy -Adenocarcinoma involving superficial gastric mucosa Entheses the biopsy is superficial and the depth of invasion cannot be accurately determined) -Reactive gastropathy and benign duodenal mucosa  B. gastric biopsy -  Chronic active gastritis -Immunostain for H. pylori is negative  MMR normal    09/14/2021 Initial Diagnosis   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[place])   09/14/2021 Cancer Staging   Staging form: Stomach, AJCC 8th Edition - Clinical stage from 09/14/2021: Stage IIB (cT3, cN0, cM0) - Signed  by [name], MD on 11/23/2021 Stage prefix: Initial diagnosis Total positive nodes: 0   09/16/2021 Imaging   CT AP w contrast  1.  Findings in keeping with gastric outlet obstruction from circumferential gastric antral mass lesion. Recommend correlation with EGD and biopsy.  2.  Indeterminate complex enhancing left adrenal mass. The degree of heterogeneity and enhancement would be atypical for adenoma. Metastatic disease or pheochromocytoma are therefore considerations.  3.  Bilobed infrarenal abdominal aortic aneurysm measuring up to 3.4 cm with right common iliac artery aneurysm.  4.  Small left pleural effusion.   09/21/2021 Procedure   EUS/EGD Findings  EGD: The esophagus was normal. The gastric body and antrum appeared  normal. Evidence of tumor was seen at the pylorus, and extending into the  pyloric channel and the duodenal bulb resulting in severe luminal  narrowing.   EUS was then performed. FNA was performed.   Impression  Successful EUS-guided gastrojejunostomy using a lumen apposing metal stent    10/18/2021 PET scan   IMPRESSION: 1. The known adenocarcinoma involving the distal stomach is hypermetabolic. No definite nodal or hepatic metastases identified. 2. No evidence of distant metastatic disease. 3. Indeterminate left adrenal nodule is unchanged from outside CT and demonstrates no hypermetabolic activity. This measures higher in density than a typical adenoma. Attention on follow-up recommended. 4. Decompressed stomach following gastrojejunostomy. 5. 3.8 cm suprarenal abdominal aortic aneurysm with smaller infrarenal component. Recommend follow-up ultrasound every 2 years. This recommendation follows ACR consensus guidelines: White Paper of the ACR Incidental Findings Committee II on Vascular Findings. J Am Coll Radiol 2013; 10:789-794. Aortic Atherosclerosis (ICD10-I70.0).   11/29/2021 -  Chemotherapy   Patient is on Treatment Plan : GASTROESOPHAGEAL FOLFOX q14d x 6  cycles        INTERVAL HISTORY:  Dillon Smith is here for a follow up of gastric cancer. He was last seen by me on 12/27/21.    All other systems were reviewed with the patient and are negative.  MEDICAL HISTORY:  Past Medical History:  Diagnosis Date   Bradycardia    CKD (chronic kidney disease), stage III ([place])    Coronary artery disease    CABG 2007   COVID-19 08/2019   Hyperlipidemia LDL goal <70    Hypertension    Persistent atrial fibrillation ([place])    Prostate cancer ([place]) 2010   PVC's (premature ventricular contractions)    Stroke (cerebrum) ([place])     SURGICAL HISTORY: Past Surgical History:  Procedure Laterality Date   COLONOSCOPY  2004   ESOPHAGOGASTRODUODENOSCOPY (EGD) WITH PROPOFOL N/A 11/22/2021   Procedure: ESOPHAGOGASTRODUODENOSCOPY (EGD) WITH PROPOFOL;  Surgeon: Carol Ada, MD;  Location: [facility];  Service: Gastroenterology;  Laterality: N/A;   EUS N/A 11/22/2021   Procedure: UPPER ENDOSCOPIC ULTRASOUND (EUS) RADIAL;  Surgeon: Carol Ada, MD;  Location: [facility];  Service: Gastroenterology;  Laterality: N/A;   INSERTION PROSTATE RADIATION SEED  2010   IR IMAGING GUIDED PORT INSERTION  11/28/2021   LAPAROTOMY N/A 12/31/2021   Procedure: EXPLORATORY LAPAROTOMY;  Surgeon: Jovita Kussmaul, MD;  Location: [facility];  Service: General;  Laterality: N/A;  DRAINAGE OF INTRAABDOMINAL ABSCESS   stent  I have reviewed the social history and family history with the patient and they are unchanged from previous note.  ALLERGIES:  is allergic to penicillins.  MEDICATIONS:  No current facility-administered medications for this visit.   No current outpatient medications on file.   Facility-Administered Medications Ordered in Other Visits  Medication Dose Route Frequency Provider Last Rate Last Admin   0.9 %  sodium chloride infusion   Intravenous PRN Leighton Ruff, MD   Stopped at 01/04/22 1748   amiodarone (PACERONE) tablet 200 mg  200 mg Oral Daily  Pixie Casino, MD   200 mg at 01/09/22 1039   apixaban (ELIQUIS) tablet 5 mg  5 mg Oral BID Meuth, Brooke A, PA-C   5 mg at 01/09/22 2106   bisacodyl (DULCOLAX) suppository 10 mg  10 mg Rectal Daily PRN Leighton Ruff, MD   10 mg at 01/05/22 1112   Chlorhexidine Gluconate Cloth 2 % PADS 6 each  6 each Topical Daily Leighton Ruff, MD   6 each at 01/09/22 2200   diphenhydrAMINE (BENADRYL) injection 25 mg  25 mg Intravenous Teresita Madura, MD   25 mg at 01/08/22 2144   fentaNYL (DURAGESIC) 25 MCG/HR 1 patch  1 patch Transdermal Q72H Lane Hacker L, DO   1 patch at 01/08/22 1650   furosemide (LASIX) injection 40 mg  40 mg Intravenous Daily Pixie Casino, MD   40 mg at 01/09/22 1029   haloperidol lactate (HALDOL) injection 1 mg  1 mg Intravenous Q6H PRN [name], MD   1 mg at 01/09/22 0250   heparin lock flush 100 unit/mL  500 Units Intracatheter Prior to discharge Lane Hacker L, DO       HYDROmorphone (DILAUDID) injection 1 mg  1 mg Intravenous Q4H PRN Loistine Chance, MD       HYDROmorphone HCl (DILAUDID) liquid 2 mg  2 mg Oral Q4H PRN Loistine Chance, MD       insulin aspart (novoLOG) injection 0-15 Units  0-15 Units Subcutaneous Q4H Luiz Ochoa, RPH   2 Units at 01/10/22 0502   lip balm (CARMEX) ointment   Topical PRN [name], MD   75 Application at 60/10/93 1132   ondansetron (ZOFRAN-ODT) disintegrating tablet 4 mg  4 mg Oral A3F PRN Leighton Ruff, MD       Or   ondansetron [facility]) injection 4 mg  4 mg Intravenous T7D PRN Leighton Ruff, MD   4 mg at 22/02/54 2706   Oral care mouth rinse  15 mL Mouth Rinse PRN Maryjane Hurter, MD       pantoprazole (PROTONIX) injection 40 mg  40 mg Intravenous QHS Leighton Ruff, MD   40 mg at 01/09/22 2106   phenol (CHLORASEPTIC) mouth spray 2 spray  2 spray Mouth/Throat Q2H PRN [name], MD       sodium chloride flush (NS) 0.9 % injection 10-40 mL  10-40 mL Intracatheter Q12H Jill Alexanders, PA-C   10 mL at 01/09/22 2220    sodium chloride flush (NS) 0.9 % injection 10-40 mL  10-40 mL Intracatheter PRN Jill Alexanders, PA-C   10 mL at 01/09/22 2220   thiamine (B-1) injection 100 mg  100 mg Intravenous Daily Maryjane Hurter, MD   100 mg at 01/09/22 1029   TPN ADULT (ION)   Intravenous Continuous TPN Luiz Ochoa, RPH 75 mL/hr at 01/09/22 1723 New Bag at 01/09/22 1723    PHYSICAL EXAMINATION: ECOG PERFORMANCE STATUS: 1 -  Symptomatic but completely ambulatory  There were no vitals filed for this visit. Wt Readings from Last 3 Encounters:  01/04/22 197 lb 8.5 oz (89.6 kg)  12/27/21 187 lb 4 oz (84.9 kg)  12/13/21 191 lb 1.9 oz (86.7 kg)     GENERAL:alert, no distress and comfortable SKIN: skin color normal, no rashes or significant lesions EYES: normal, Conjunctiva are pink and non-injected, sclera clear  NEURO: alert & oriented x 3 with fluent speech  LABORATORY DATA:  I have reviewed the data as listed    Latest Ref Rng & Units 01/10/2022    3:36 AM 01/09/2022    5:06 AM 01/07/2022    4:30 AM  CBC  WBC 4.0 - 10.5 K/uL 4.6  5.1  5.7   Hemoglobin 13.0 - 17.0 g/dL 8.7  9.0  9.6   Hematocrit 39.0 - 52.0 % 26.2  26.9  28.4   Platelets 150 - 400 K/uL 190  159  103         Latest Ref Rng & Units 01/10/2022    3:36 AM 01/09/2022    5:06 AM 01/08/2022    3:24 AM  CMP  Glucose 70 - 99 mg/dL 123  167  156   BUN 8 - 23 mg/dL 39  36  38   Creatinine 0.61 - 1.24 mg/dL 1.45  1.36  1.60   Sodium 135 - 145 mmol/L 142  142  147   Potassium 3.5 - 5.1 mmol/L 4.1  4.0  4.3   Chloride 98 - 111 mmol/L 111  112  114   CO2 22 - 32 mmol/L 26  25  26    Calcium 8.9 - 10.3 mg/dL 8.3  8.0  8.4   Total Protein 6.5 - 8.1 g/dL 6.5     Total Bilirubin 0.3 - 1.2 mg/dL 0.8     Alkaline Phos 38 - 126 U/L 71     AST 15 - 41 U/L 38     ALT 0 - 44 U/L 40         RADIOGRAPHIC STUDIES: I have personally reviewed the radiological images as listed and agreed with the findings in the report. No results found.     No orders of the defined types were placed in this encounter.  All questions were answered. The patient knows to call the clinic with any problems, questions or concerns. No barriers to learning was detected. The total time spent in the appointment was 30 minutes.     [name], MD 01/10/2022

## 2022-01-10 NOTE — Progress Notes (Addendum)
DAILY PROGRESS NOTE   Patient Name: Dillon Smith Date of Encounter: 01/10/2022 Cardiologist: Lauree Chandler, MD  Chief Complaint   No complaints  Patient Profile   88 yo male admitted with abdominal pain, found to have pneumoperitoneum with recently diagnosed gastric cancer, now in [place] palliative care involvement - changed to DNR appropriately given poor prognosis. He is eating some. I have switched meds to po to reduce IV intake. He is recorded net even overnight, but overall +13L.   Objective   Vitals:   01/09/22 1331 01/09/22 1500 01/09/22 2040 01/10/22 0433  BP: (!) 131/59 128/82 (!) 126/59 135/64  Pulse: 68 80 61 77  Resp: '20  19 19  '$ Temp: 98.5 F (36.9 C) 98.7 F (37.1 C) 98.9 F (37.2 C) 98.4 F (36.9 C)  TempSrc: Oral Axillary Oral Oral  SpO2: 97% 99% 100% 100%  Weight:      Height:        Intake/Output Summary (Last 24 hours) at 01/10/2022 1103 Last data filed at 01/10/2022 0551 Gross per 24 hour  Intake 2169.58 ml  Output 2415 ml  Net -245.42 ml   Filed Weights   12/31/21 2015 01/04/22 0908  Weight: 84.7 kg 89.6 kg    Physical Exam   General appearance: alert and no distress Lungs: clear to auscultation bilaterally Heart: regular rate and rhythm Extremities: edema trace LE bilateral Neurologic: Mental status: Alert, oriented, thought content appropriate, hard of hearing  Inpatient Medications    Scheduled Meds:  amiodarone  200 mg Oral Daily   apixaban  5 mg Oral BID   Chlorhexidine Gluconate Cloth  6 each Topical Daily   diphenhydrAMINE  25 mg Intravenous QHS   feeding supplement  237 mL Oral TID WC   fentaNYL  1 patch Transdermal Q72H   furosemide  40 mg Intravenous Daily   insulin aspart  0-15 Units Subcutaneous Q4H   pantoprazole (PROTONIX) IV  40 mg Intravenous QHS   sodium chloride flush  10-40 mL Intracatheter Q12H   thiamine injection  100 mg Intravenous Daily    Continuous Infusions:   sodium chloride Stopped (01/04/22 1748)   TPN ADULT (ION) 75 mL/hr at 01/09/22 1723   TPN ADULT (ION)      PRN Meds: sodium chloride, bisacodyl, haloperidol lactate, heparin lock flush, HYDROmorphone (DILAUDID) injection, HYDROmorphone HCl, lip balm, ondansetron **OR** ondansetron (ZOFRAN) IV, mouth rinse, phenol, sodium chloride flush   Labs   Results for orders placed or performed during the hospital encounter of 12/31/21 (from the past 48 hour(s))  Glucose, capillary     Status: Abnormal   Collection Time: 01/08/22 11:55 AM  Result Value Ref Range   Glucose-Capillary 152 (H) 70 - 99 mg/dL    Comment: Glucose reference range applies only to samples taken after fasting for at least 8 hours.  Glucose, capillary     Status: Abnormal   Collection Time: 01/08/22  4:25 PM  Result Value Ref Range   Glucose-Capillary 146 (H) 70 - 99 mg/dL    Comment: Glucose reference range applies only to samples taken after fasting for at least 8 hours.  Glucose, capillary     Status: Abnormal   Collection Time: 01/08/22  8:22 PM  Result Value Ref Range   Glucose-Capillary 171 (H) 70 - 99 mg/dL    Comment: Glucose reference range applies only to samples taken after fasting for at least 8 hours.  Heparin level (unfractionated)     Status:  Abnormal   Collection Time: 01/08/22  9:43 PM  Result Value Ref Range   Heparin Unfractionated <0.10 (L) 0.30 - 0.70 IU/mL    Comment: (NOTE) The clinical reportable range upper limit is being lowered to >1.10 to align with the FDA approved guidance for the current laboratory assay.  If heparin results are below expected values, and patient dosage has  been confirmed, suggest follow up testing of antithrombin III levels. Performed at [facility], [place] [address] [place], [place] [address]   Glucose, capillary     Status: Abnormal   Collection Time: 01/09/22 12:10 AM  Result Value Ref Range   Glucose-Capillary 148 (H) 70 - 99 mg/dL     Comment: Glucose reference range applies only to samples taken after fasting for at least 8 hours.  Basic metabolic panel     Status: Abnormal   Collection Time: 01/09/22  5:06 AM  Result Value Ref Range   Sodium 142 135 - 145 mmol/L   Potassium 4.0 3.5 - 5.1 mmol/L   Chloride 112 (H) 98 - 111 mmol/L   CO2 25 22 - 32 mmol/L   Glucose, Bld 167 (H) 70 - 99 mg/dL    Comment: Glucose reference range applies only to samples taken after fasting for at least 8 hours.   BUN 36 (H) 8 - 23 mg/dL   Creatinine, Ser 1.36 (H) 0.61 - 1.24 mg/dL   Calcium 8.0 (L) 8.9 - 10.3 mg/dL   GFR, Estimated 49 (L) >60 mL/min    Comment: (NOTE) Calculated using the CKD-EPI Creatinine Equation (2021)    Anion gap 5 5 - 15    Comment: Performed at [facility], [place] [address], [place], [place] [address]  CBC     Status: Abnormal   Collection Time: 01/09/22  5:06 AM  Result Value Ref Range   WBC 5.1 4.0 - 10.5 K/uL   RBC 2.91 (L) 4.22 - 5.81 MIL/uL   Hemoglobin 9.0 (L) 13.0 - 17.0 g/dL   HCT 26.9 (L) 39.0 - 52.0 %   MCV 92.4 80.0 - 100.0 fL   MCH 30.9 26.0 - 34.0 pg   MCHC 33.5 30.0 - 36.0 g/dL   RDW 14.9 11.5 - 15.5 %   Platelets 159 150 - 400 K/uL   nRBC 0.0 0.0 - 0.2 %    Comment: Performed at [facility], [place] [address], [place], [place] [address]  Brain natriuretic peptide     Status: Abnormal   Collection Time: 01/09/22  5:06 AM  Result Value Ref Range   B Natriuretic Peptide 482.5 (H) 0.0 - 100.0 pg/mL    Comment: Performed at [facility], [place] [address], [place], [place] [address]  Glucose, capillary     Status: Abnormal   Collection Time: 01/09/22  5:09 AM  Result Value Ref Range   Glucose-Capillary 170 (H) 70 - 99 mg/dL    Comment: Glucose reference range applies only to samples taken after fasting for at least 8 hours.  Glucose, capillary     Status: Abnormal   Collection Time: 01/09/22  8:00 AM  Result Value Ref Range    Glucose-Capillary 154 (H) 70 - 99 mg/dL    Comment: Glucose reference range applies only to samples taken after fasting for at least 8 hours.  Heparin level (unfractionated)     Status: Abnormal   Collection Time: 01/09/22  9:34 AM  Result Value Ref Range   Heparin Unfractionated 0.17 (L)  0.30 - 0.70 IU/mL    Comment: (NOTE) The clinical reportable range upper limit is being lowered to >1.10 to align with the FDA approved guidance for the current laboratory assay.  If heparin results are below expected values, and patient dosage has  been confirmed, suggest follow up testing of antithrombin III levels. Performed at [facility], [place] [address] [place], [place] [address]   Magnesium     Status: None   Collection Time: 01/09/22  9:34 AM  Result Value Ref Range   Magnesium 1.7 1.7 - 2.4 mg/dL    Comment: Performed at [facility], [place] [address] [place], [place] [address]  Phosphorus     Status: None   Collection Time: 01/09/22  9:34 AM  Result Value Ref Range   Phosphorus 3.1 2.5 - 4.6 mg/dL    Comment: Performed at [facility], [place] [address], [place], [place] [address]  Albumin     Status: Abnormal   Collection Time: 01/09/22  9:34 AM  Result Value Ref Range   Albumin 1.7 (L) 3.5 - 5.0 g/dL    Comment: Performed at [facility], [place] [address], [place], [place] [address]  Glucose, capillary     Status: Abnormal   Collection Time: 01/09/22 11:31 AM  Result Value Ref Range   Glucose-Capillary 159 (H) 70 - 99 mg/dL    Comment: Glucose reference range applies only to samples taken after fasting for at least 8 hours.  Glucose, capillary     Status: Abnormal   Collection Time: 01/09/22  5:40 PM  Result Value Ref Range   Glucose-Capillary 148 (H) 70 - 99 mg/dL    Comment: Glucose reference range applies only to samples taken after fasting for at least 8 hours.  Glucose, capillary     Status: Abnormal    Collection Time: 01/09/22  8:37 PM  Result Value Ref Range   Glucose-Capillary 130 (H) 70 - 99 mg/dL    Comment: Glucose reference range applies only to samples taken after fasting for at least 8 hours.  Glucose, capillary     Status: Abnormal   Collection Time: 01/10/22 12:13 AM  Result Value Ref Range   Glucose-Capillary 129 (H) 70 - 99 mg/dL    Comment: Glucose reference range applies only to samples taken after fasting for at least 8 hours.  Magnesium     Status: None   Collection Time: 01/10/22  3:36 AM  Result Value Ref Range   Magnesium 1.8 1.7 - 2.4 mg/dL    Comment: Performed at [facility], [place] [address] [place], [place] [address]  Phosphorus     Status: None   Collection Time: 01/10/22  3:36 AM  Result Value Ref Range   Phosphorus 3.5 2.5 - 4.6 mg/dL    Comment: Performed at [facility], [place] [address], [place], [place] [address]  Triglycerides     Status: None   Collection Time: 01/10/22  3:36 AM  Result Value Ref Range   Triglycerides 69 <150 mg/dL    Comment: Performed at [facility], [place] [address], [place], [place] [address]  CBC     Status: Abnormal   Collection Time: 01/10/22  3:36 AM  Result Value Ref Range   WBC 4.6 4.0 - 10.5 K/uL   RBC 2.87 (L) 4.22 - 5.81 MIL/uL   Hemoglobin 8.7 (L) 13.0 - 17.0 g/dL   HCT 26.2 (L) 39.0 - 52.0 %  MCV 91.3 80.0 - 100.0 fL   MCH 30.3 26.0 - 34.0 pg   MCHC 33.2 30.0 - 36.0 g/dL   RDW 15.1 11.5 - 15.5 %   Platelets 190 150 - 400 K/uL   nRBC 0.0 0.0 - 0.2 %    Comment: Performed at [facility], [place] [address], [place], [place] [address]  Comprehensive metabolic panel     Status: Abnormal   Collection Time: 01/10/22  3:36 AM  Result Value Ref Range   Sodium 142 135 - 145 mmol/L   Potassium 4.1 3.5 - 5.1 mmol/L   Chloride 111 98 - 111 mmol/L   CO2 26 22 - 32 mmol/L   Glucose, Bld 123 (H) 70 - 99 mg/dL    Comment: Glucose reference range  applies only to samples taken after fasting for at least 8 hours.   BUN 39 (H) 8 - 23 mg/dL   Creatinine, Ser 1.45 (H) 0.61 - 1.24 mg/dL   Calcium 8.3 (L) 8.9 - 10.3 mg/dL   Total Protein 6.5 6.5 - 8.1 g/dL   Albumin 1.6 (L) 3.5 - 5.0 g/dL   AST 38 15 - 41 U/L   ALT 40 0 - 44 U/L   Alkaline Phosphatase 71 38 - 126 U/L   Total Bilirubin 0.8 0.3 - 1.2 mg/dL   GFR, Estimated 46 (L) >60 mL/min    Comment: (NOTE) Calculated using the CKD-EPI Creatinine Equation (2021)    Anion gap 5 5 - 15    Comment: Performed at [facility], [place] [address], [place], [place] [address]  Glucose, capillary     Status: Abnormal   Collection Time: 01/10/22  4:27 AM  Result Value Ref Range   Glucose-Capillary 134 (H) 70 - 99 mg/dL    Comment: Glucose reference range applies only to samples taken after fasting for at least 8 hours.  Glucose, capillary     Status: Abnormal   Collection Time: 01/10/22  7:55 AM  Result Value Ref Range   Glucose-Capillary 121 (H) 70 - 99 mg/dL    Comment: Glucose reference range applies only to samples taken after fasting for at least 8 hours.    ECG   N/A  Telemetry   Afib with CVR - Personally Reviewed  Radiology    No results found.  Cardiac Studies   N/A  Assessment   Principal Problem:   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HCC) Active Problems:   Essential hypertension   Coronary artery disease   Atrial fibrillation (HCC)   Hyperlipidemia   Stage 3a chronic kidney disease (CKD) (HCC)   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HCC)   Acute on chronic systolic heart failure (HCC)   Plan   Remains net positive with LVEF 25-30%. He is volume overloaded - overnight he was about even. Will plan to further increase IV lasix to 40 mg BID today. Continue oral amiodarone and Eliquis. In rate-controlled afib. BP normal to mildly elevated. GFR <50, creatinine up to 1.45. Would not recommend ACE-I/ARB/ARI/SGLT2i/Aldactone at this point. Could consider hydralazine if bp  allows. Weight 89.6 kg on 7/7 - now measuring 84.7 kg.  Time Spent Directly with Patient:  I have spent a total of 25 minutes with the patient reviewing hospital notes, telemetry, EKGs, labs and examining the patient as well as establishing an assessment and plan that was discussed personally with the patient.  > 50% of time was spent in direct patient care.  Length of Stay:  LOS: 10 days  Pixie Casino, MD, [facility], [place] Director of the [facility]  Cardiovascular Risk Reduction [facility] Diplomate of the American Board of Clinical Lipidology Attending Cardiologist  Direct Dial: [phone]  Fax: [phone]  Website:  [email or web address] Neoma [email or web address] 01/10/2022, 11:03 AM

## 2022-01-10 NOTE — Progress Notes (Signed)
Daily Progress Note   Patient Name: Dillon Smith       Date: 01/10/2022 DOB: 11-01-1929  Age: 88 y.o. MRN#: [id number] Attending Physician: Nolon Nations, MD Primary Care Physician: [name] Admit Date: 12/31/2021  Reason for Consultation/Follow-up: Establishing goals of care  Subjective: Much more awake and alert, responds appropriately, able to have conversations freely, does not appear to be in distress. States that he is attempting to eat   Length of Stay: 10  Current Medications: Scheduled Meds:   amiodarone  200 mg Oral Daily   apixaban  5 mg Oral BID   Chlorhexidine Gluconate Cloth  6 each Topical Daily   diphenhydrAMINE  25 mg Intravenous QHS   feeding supplement  237 mL Oral TID WC   fentaNYL  1 patch Transdermal Q72H   furosemide  40 mg Intravenous BID   insulin aspart  0-15 Units Subcutaneous Q4H   pantoprazole (PROTONIX) IV  40 mg Intravenous QHS   sodium chloride flush  10-40 mL Intracatheter Q12H   thiamine injection  100 mg Intravenous Daily    Continuous Infusions:  sodium chloride Stopped (01/04/22 1748)   TPN ADULT (ION) 75 mL/hr at 01/09/22 1723   TPN ADULT (ION)      PRN Meds: sodium chloride, bisacodyl, haloperidol lactate, heparin lock flush, HYDROmorphone (DILAUDID) injection, HYDROmorphone HCl, lip balm, ondansetron **OR** ondansetron (ZOFRAN) IV, mouth rinse, phenol, sodium chloride flush  Physical Exam         Awake alert No distress Regular work of breathing S 1 S 2  No edema Has port  Vital Signs: BP 135/64 (BP Location: Right Arm)   Pulse 77   Temp 98.4 F (36.9 C) (Oral)   Resp 19   Ht '5\' 11"'$  (1.803 m)   Wt 84.7 kg   SpO2 100%   BMI 26.04 kg/m  SpO2: SpO2: 100 % O2 Device: O2 Device: Nasal Cannula O2 Flow Rate: O2 Flow  Rate (L/min): 0 L/min  Intake/output summary:  Intake/Output Summary (Last 24 hours) at 01/10/2022 1258 Last data filed at 01/10/2022 1200 Gross per 24 hour  Intake 2249.48 ml  Output 2465 ml  Net -215.52 ml    LBM: Last BM Date : 01/09/22 Baseline Weight: Weight: 84.7 kg Most recent weight: Weight: 84.7 kg  Palliative Assessment/Data:      Patient Active Problem List   Diagnosis Date Noted   Acute on chronic systolic heart failure ([place]) 01/08/2022   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[place]) 12/31/2021   Port-A-Cath in place 12/13/2021   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[place]) 10/29/2021   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 08/19/2019   Symptomatic bradycardia 08/19/2019   Bradycardia 08/18/2019   COVID-19 virus infection 08/17/2019   Atrial fibrillation ([place]) 08/17/2019   Hyperlipidemia 08/17/2019   Stage 3a chronic kidney disease (CKD) ([place]) 08/17/2019   Acute ischemic stroke (HCC) mult R MCA and PCA embolic infarcts d/t AF not on AC s/p tPA 08/15/2019   CARCINOMA, PROSTATE 09/15/2007   Essential hypertension 09/15/2007   Coronary artery disease 09/15/2007   HEMORRHOIDS, INTERNAL 09/15/2007   RADIATION PROCTITIS 09/15/2007    Palliative Care Assessment & Plan   Patient Profile:    Assessment: 88 year old gentleman admitted with pneumoperitoneum in the setting of known gastric cancer, status post ex lap drainage of intra-abdominal abscess, palliative care following for assistance with pain management.  Discussion/recommendations/Plan: DNR DNI Continue current mode of care CIR versus SNF rehab with palliative on discharge Continue current pain management regimen    Goals of Care and Additional Recommendations: Limitations on Scope of Treatment: Full Scope Treatment  Code Status: now DNR DNI.     Code Status Orders  (From admission, onward)           Start     Ordered   12/31/21 2012  Full code  Continuous        12/31/21 2011            Code Status History     Date Active Date Inactive Code Status Order ID Comments User Context   12/31/2021 1440 12/31/2021 2012 DNR [id number]  Garald Balding, PA-C ED   08/19/2019 0131 08/21/2019 1831 Full Code [id number]  Vianne Bulls, MD ED   08/15/2019 2100 08/17/2019 2210 Full Code [id number]  Amie Portland, MD ED      Advance Directive Documentation    [place] Most Recent Value  Type of Advance Directive Living will  Pre-existing out of facility DNR order (yellow form or pink MOST form) --  "MOST" Form in Place? --       Prognosis:  Guarded   Discharge Planning: To Be Determined  Care plan was discussed with patient   Thank you for allowing the Palliative Medicine Team to assist in the care of this patient.    MOD MDM.     Greater than 50%  of this time was spent counseling and coordinating care related to the above assessment and plan.  Loistine Chance, MD  Please contact Palliative Medicine Team phone at [phone] for questions and concerns.

## 2022-01-10 NOTE — Progress Notes (Addendum)
Patient ID: Dillon Smith, male   DOB: 1930/05/04, 88 y.o.   MRN: [id number] [facility] Surgery Progress Note  10 Days Post-Op  Subjective: CC-  Sitting up in bed, says he ate all of his breakfast. Asking me what happened. +BM.  Objective: Vital signs in last 24 hours: Temp:  [98.4 F (36.9 C)-98.9 F (37.2 C)] 98.4 F (36.9 C) (07/13 0433) Pulse Rate:  [61-80] 77 (07/13 0433) Resp:  [19-20] 19 (07/13 0433) BP: (126-135)/(59-82) 135/64 (07/13 0433) SpO2:  [97 %-100 %] 100 % (07/13 0433) Last BM Date : 01/09/22  Intake/Output from previous day: 07/12 0701 - 07/13 0700 In: 2829.6 [P.O.:480; I.V.:2349.6] Out: 2565 [Urine:2550; Drains:15] Intake/Output this shift: No intake/output data recorded.  PE: Gen: Alert, NAD HENT: there is a soft, mobile, mass of soft tissue on his anterior neck - he states it has been there for 10-12 years and worked up as "benign fatty tissue" Pulm:  Normal effort on room air Abd: Soft, appropriately tender, drains SS, midline wound pink and moist fascia in tact Skin: warm and dry, no rashes; LUE PICC, port R chest wall Psych: A&Ox3   Lab Results:  Recent Labs    01/09/22 0506 01/10/22 0336  WBC 5.1 4.6  HGB 9.0* 8.7*  HCT 26.9* 26.2*  PLT 159 190   BMET Recent Labs    01/09/22 0506 01/10/22 0336  NA 142 142  K 4.0 4.1  CL 112* 111  CO2 25 26  GLUCOSE 167* 123*  BUN 36* 39*  CREATININE 1.36* 1.45*  CALCIUM 8.0* 8.3*   PT/INR No results for input(s): "LABPROT", "INR" in the last 72 hours. CMP     Component Value Date/Time   NA 142 01/10/2022 0336   NA 143 02/06/2021 0845   K 4.1 01/10/2022 0336   CL 111 01/10/2022 0336   CO2 26 01/10/2022 0336   GLUCOSE 123 (H) 01/10/2022 0336   BUN 39 (H) 01/10/2022 0336   BUN 23 02/06/2021 0845   CREATININE 1.45 (H) 01/10/2022 0336   CREATININE 1.57 (H) 12/13/2021 0827   CALCIUM 8.3 (L) 01/10/2022 0336   PROT 6.5 01/10/2022 0336   PROT 6.4 02/06/2021 0845   ALBUMIN 1.6 (L)  01/10/2022 0336   ALBUMIN 3.9 02/06/2021 0845   AST 38 01/10/2022 0336   AST 18 12/13/2021 0827   ALT 40 01/10/2022 0336   ALT 17 12/13/2021 0827   ALKPHOS 71 01/10/2022 0336   BILITOT 0.8 01/10/2022 0336   BILITOT 0.5 12/13/2021 0827   GFRNONAA 46 (L) 01/10/2022 0336   GFRNONAA 42 (L) 12/13/2021 0827   GFRAA 43 (L) 07/06/2020 1156   Lipase     Component Value Date/Time   LIPASE 35 12/31/2021 0941       Studies/Results: No results found.  Anti-infectives: Anti-infectives (From admission, onward)    Start     Dose/Rate Route Frequency Ordered Stop   01/04/22 1200  piperacillin-tazobactam (ZOSYN) IVPB 3.375 g  Status:  Discontinued        3.375 g 12.5 mL/hr over 240 Minutes Intravenous Every 8 hours 01/04/22 0729 01/08/22 1116   01/02/22 1400  piperacillin-tazobactam (ZOSYN) IVPB 2.25 g  Status:  Discontinued        2.25 g 100 mL/hr over 30 Minutes Intravenous Every 8 hours 01/02/22 0718 01/04/22 0728   01/01/22 2000  vancomycin (VANCOCIN) IVPB 1000 mg/200 mL premix  Status:  Discontinued        1,000 mg 200 mL/hr over 60 Minutes Intravenous  Every 24 hours 12/31/21 1820 01/01/22 0824   12/31/21 2000  piperacillin-tazobactam (ZOSYN) IVPB 3.375 g  Status:  Discontinued        3.375 g 12.5 mL/hr over 240 Minutes Intravenous Every 8 hours 12/31/21 1821 01/02/22 0718   12/31/21 1130  vancomycin (VANCOREADY) IVPB 1750 mg/350 mL        1,750 mg 175 mL/hr over 120 Minutes Intravenous  Once 12/31/21 1124 12/31/21 1411   12/31/21 1130  ceFEPIme (MAXIPIME) 2 g in sodium chloride 0.9 % 100 mL IVPB        2 g 200 mL/hr over 30 Minutes Intravenous  Once 12/31/21 1124 12/31/21 1226        Assessment/Plan Pneumoperitoneum in the setting of known gastric cancer on chemotherapy POD #10 status post ex lap drainage of intra-abdominal abscess 7/3 Dr. Marlou Starks Pulled out his NG 7/9, unable to get UGI due to inability to swallow all of the contrast. Taking in small amounts of liquids, no  evidence of leak at this time with serosanguinous JP drainage. SLP following. Continue TPN. Start calorie count. Continue twice daily wet-to-dry dressing changes to midline wound  -Appreciate palliative care following and helping with pain control.  [facility] consulting for assistance with chronic medical conditions - AKI on CKD III, HTN, HLD -Cardiology consult for assistance with afib and heart failure - switched to oral amiodarone, eliquis   FEN: FLD, TPN; ok to advance to solid diet if cleared by speech therapy ID: Zosyn 7/3-7/11 VTE: SCD's, eliquis Foley: external cath Dispo: progressive care, PT/OT, adv diet CIR consult, CIR vs. SNF rehab with palliative care on discharge   LOS: 10 days    Jill Alexanders, [facility] 01/10/2022, 10:42 AM Please see Amion for pager number during day hours 7:00am-4:30pm

## 2022-01-10 NOTE — Progress Notes (Signed)
Nutrition Follow-up  DOCUMENTATION CODES:   Not applicable  INTERVENTION:  - continue Ensure Plus High Protein po BID, each supplement provides 350 kcal and 20 grams of protein.  - diet advancement as medically feasible.  - continue TPN per Pharmacist.  - RD will follow-up 7/14 and 7/17 with results of Calorie Count.   NUTRITION DIAGNOSIS:   Increased nutrient needs related to cancer and cancer related treatments, post-op healing as evidenced by estimated needs. -ongoing  GOAL:   Patient will meet greater than or equal to 90% of their needs -met with TPN regimen and minimal PO intake  MONITOR:   PO intake, Supplement acceptance, Diet advancement, Labs, Weight trends, Other (Comment) (TPN regimen)  REASON FOR ASSESSMENT:   Consult Calorie Count  ASSESSMENT:   Dillon Smith with medical history of stage IIB gastric adenocarcinoma currently receiving chemo, Afib on eliquis, stage 3 CKD, HTN, prostate cancer, stroke, and HLD. He presented to the ED on 7/3 due to abdominal pain. In the ED, CT abdomen/pelvis showed intra-abdominal free air which was concerning for bowel perforation. On 7/3 he emergently underwent ex lap d/t perforation.  Significant Events: 7/3- admission; ex lap and drainage of intra-abdominal abscess; NGT placed 7/5- initial RD assessment 7/7- NGT removed; triple lumen PICC placed in L brachial 7/10- diet advanced to CLD 7/12- diet advanced to FLD   Patient laying in bed with no visitors present at the time of RD visit. Lunch tray of mainly clear liquids and a cup of pudding on bedside table, untouched.  Patient reports feeling well overall today. He thinks he may have had chemo this AM. Offered to set patient up for lunch and open things for him but patient declines.   Ensure ordered TID starting today.   Weight today documented as 187 lb, weight on 7/7 was 197 lb, and weight on 7/3 was 187 lb. Weight today appears copied forward from 7/3.  Non-pitting edema to all extremities documented in the edema section of flow sheet.  Patient is receiving custom TPN at goal rate of 75 ml/hr which is providing 2344 kcal and 124 grams protein.   Pharmacist's note from today outlines that there has been concern by the medical team about volume overload and that several IV infusions/meds now changed to PO for this reason.   Surgery team note from today outlines that patient reported eating 100% of breakfast.    Labs reviewed; CBGs: 134, 121, 140 mg/dl, BUN: 39 mg/dl, creatinine: 1.45 mg/dl, Ca: 8.3 mg/dl, GFR: 46 ml/min.  Medications reviewed; 40 mg IV lasix BID, sliding scale novolog, 100 mg IV thiamine/day.   Diet Order:   Diet Order             Diet full liquid Room service appropriate? Yes; Fluid consistency: Thin  Diet effective now                   EDUCATION NEEDS:   No education needs have been identified at this time  Skin:  Skin Assessment: Skin Integrity Issues: Skin Integrity Issues:: Incisions Incisions: abdomen (7/3)  Last BM:  7/13 (type 6 x3, one large amount and 2 medium amounts)  Height:   Ht Readings from Last 1 Encounters:  12/31/21 5' 11"  (1.803 m)    Weight:   Wt Readings from Last 1 Encounters:  01/10/22 84.7 kg     BMI:  Body mass index is 26.04 kg/m.  Estimated Nutritional Needs:  Kcal:  2300-2500 kcal Protein:  110-125 grams  Fluid:  >/= 2.3 L/day     Jarome Matin, MS, RD, LDN, CNSC Registered Dietitian II Inpatient Clinical Nutrition RD pager # and on-call/weekend pager # available in [facility]

## 2022-01-10 NOTE — Consult Note (Signed)
   [facility] Inpatient Consult   01/10/2022  Dillon Smith 11/29/1929 [id number]   [facility] [ACO] Patient: Medicare ACO REACH  Primary Care Provider:  [name]   Patient screened for hospitalization with noted high risk score for unplanned readmission risk and  to assess for potential [place] Management service needs for post hospital transition.  Review of patient's medical record reveals patient is for post hospital.   Plan:  Continue to follow progress and disposition to assess for post hospital care management needs.    For questions contact:   Natividad Brood, RN BSN [facility] Liaison  [phone] business mobile phone Toll free office [phone]  Fax number: [phone] [email or web address]

## 2022-01-10 NOTE — Progress Notes (Addendum)
HEMATOLOGY-ONCOLOGY PROGRESS NOTE  ASSESSMENT AND PLAN: 1.  Adenocarcinoma of the pylorus/distal stomach 2.  Pneumoperitoneum 3.  Normocytic anemia due to recent surgery/chemotherapy 4.  Hypertension 5.  Hyperlipidemia 6.  Atrial fibrillation 7.  History of CVA 8.  CKD 9.  History of prostate cancer status post brachytherapy in 2004  -The patient has been receiving neoadjuvant chemotherapy for his gastric cancer.  Has overall tolerated well.  Chemotherapy will be placed on hold at this time.  We will reevaluate as an outpatient to determine if we can resume chemotherapy depending on how he recovers. -Status post ex lap drainage of the intra-abdominal abscess on 12/31/2021.  Slowly recovering from surgery.  He has TPN running and is on a full liquid diet at this time.  Awaiting speech therapy evaluation. -The patient has anemia.  Recommend close monitoring of hemoglobin and transfuse for hemoglobin less than 7.5. -Management of chronic medical conditions per hospitalist.  Dillon Smith  SUBJECTIVE: Dillon Smith is followed by our office for gastric cancer.  He has been receiving neoadjuvant chemotherapy with FOLFOX.  Has completed 3 cycles-last given on 12/27/2021.  Now admitted with pneumoperitoneum and is status post ex lap drainage of the intra-abdominal abscess on 12/31/2021.  This morning, he reports abdominal discomfort.  On TPN.  Currently on full liquids.  Bowels are moving.  Does not seem to recall that he had surgery.  Oncology History  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[place])  09/14/2021 Initial Biopsy   A. pyloric channel biopsy -Adenocarcinoma involving superficial gastric mucosa Entheses the biopsy is superficial and the depth of invasion cannot be accurately determined) -Reactive gastropathy and benign duodenal mucosa  B. gastric biopsy -Chronic active gastritis -Immunostain for H. pylori is negative  MMR normal    09/14/2021 Initial Diagnosis   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[place])    09/14/2021 Cancer Staging   Staging form: Stomach, AJCC 8th Edition - Clinical stage from 09/14/2021: Stage IIB (cT3, cN0, cM0) - Signed by [name], MD on 11/23/2021 Stage prefix: Initial diagnosis Total positive nodes: 0   09/16/2021 Imaging   CT AP w contrast  1.  Findings in keeping with gastric outlet obstruction from circumferential gastric antral mass lesion. Recommend correlation with EGD and biopsy.  2.  Indeterminate complex enhancing left adrenal mass. The degree of heterogeneity and enhancement would be atypical for adenoma. Metastatic disease or pheochromocytoma are therefore considerations.  3.  Bilobed infrarenal abdominal aortic aneurysm measuring up to 3.4 cm with right common iliac artery aneurysm.  4.  Small left pleural effusion.   09/21/2021 Procedure   EUS/EGD Findings  EGD: The esophagus was normal. The gastric body and antrum appeared  normal. Evidence of tumor was seen at the pylorus, and extending into the  pyloric channel and the duodenal bulb resulting in severe luminal  narrowing.   EUS was then performed. FNA was performed.   Impression  Successful EUS-guided gastrojejunostomy using a lumen apposing metal stent    10/18/2021 PET scan   IMPRESSION: 1. The known adenocarcinoma involving the distal stomach is hypermetabolic. No definite nodal or hepatic metastases identified. 2. No evidence of distant metastatic disease. 3. Indeterminate left adrenal nodule is unchanged from outside CT and demonstrates no hypermetabolic activity. This measures higher in density than a typical adenoma. Attention on follow-up recommended. 4. Decompressed stomach following gastrojejunostomy. 5. 3.8 cm suprarenal abdominal aortic aneurysm with smaller infrarenal component. Recommend follow-up ultrasound every 2 years. This recommendation follows ACR consensus guidelines: White Paper of the ACR  Incidental Findings Committee II on Vascular Findings. J Am Coll Radiol 2013; 10:789-794.  Aortic Atherosclerosis (ICD10-I70.0).   11/29/2021 -  Chemotherapy   Patient is on Treatment Plan : GASTROESOPHAGEAL FOLFOX q14d x 6 cycles        REVIEW OF SYSTEMS:   Review of Systems  Constitutional:  Negative for chills and fever.  HENT: Negative.    Respiratory: Negative.    Cardiovascular: Negative.   Gastrointestinal:  Positive for abdominal pain. Negative for nausea and vomiting.  Skin: Negative.   Neurological: Negative.     I have reviewed the past medical history, past surgical history, social history and family history with the patient and they are unchanged from previous note.   PHYSICAL EXAMINATION: ECOG PERFORMANCE STATUS: 2 - Symptomatic, <50% confined to bed  Vitals:   01/09/22 2040 01/10/22 0433  BP: (!) 126/59 135/64  Pulse: 61 77  Resp: 19 19  Temp: 98.9 F (37.2 C) 98.4 F (36.9 C)  SpO2: 100% 100%   Filed Weights   12/31/21 2015 01/04/22 0908  Weight: 84.7 kg 89.6 kg    Intake/Output from previous day: 07/12 0701 - 07/13 0700 In: 2829.6 [P.O.:480; I.V.:2349.6] Out: 2565 [Urine:2550; Drains:15]  Physical Exam Vitals reviewed.  Constitutional:      General: He is not in acute distress. HENT:     Head: Normocephalic.  Eyes:     General: No scleral icterus. Pulmonary:     Effort: Pulmonary effort is normal. No respiratory distress.  Abdominal:     Comments: Midline incision with dry dressing in place, drains in place.  Skin:    General: Skin is warm and dry.  Neurological:     Mental Status: He is alert.     Comments: Mildly confused, does not recall having surgery    LABORATORY DATA:  I have reviewed the data as listed    Latest Ref Rng & Units 01/10/2022    3:36 AM 01/09/2022    5:06 AM 01/08/2022    3:24 AM  CMP  Glucose 70 - 99 mg/dL 123  167  156   BUN 8 - 23 mg/dL 39  36  38   Creatinine 0.61 - 1.24 mg/dL 1.45  1.36  1.60   Sodium 135 - 145 mmol/L 142  142  147   Potassium 3.5 - 5.1 mmol/L 4.1  4.0  4.3   Chloride 98 -  111 mmol/L 111  112  114   CO2 22 - 32 mmol/L 26  25  26    Calcium 8.9 - 10.3 mg/dL 8.3  8.0  8.4   Total Protein 6.5 - 8.1 g/dL 6.5     Total Bilirubin 0.3 - 1.2 mg/dL 0.8     Alkaline Phos 38 - 126 U/L 71     AST 15 - 41 U/L 38     ALT 0 - 44 U/L 40       Lab Results  Component Value Date   WBC 4.6 01/10/2022   HGB 8.7 (L) 01/10/2022   HCT 26.2 (L) 01/10/2022   MCV 91.3 01/10/2022   PLT 190 01/10/2022   NEUTROABS 1.0 (L) 12/31/2021    Lab Results  Component Value Date   CEA1 4.03 12/27/2021    DG UGI W SINGLE CM (SOL OR THIN BA)  Result Date: 01/07/2022 CLINICAL DATA:  Pneumoperitoneum in the setting of known gastric cancer on chemotherapy. Status post ex lap drainage of intra-abdominal abscess. EXAM: UPPER GI SERIES WITH KUB TECHNIQUE: A water-soluble  contrast upper GI series was attempted. The examination was attempted by Rowe Robert, PA-C, and was supervised and interpreted by Dr. Kellie Simmering. FLUOROSCOPY: Fluoroscopy time: 42 seconds (12.9 mGy). COMPARISON:  CTA abdomen/pelvis 12/31/2021. FINDINGS: Prior to the procedure, a scout radiograph of the abdomen was obtained. Bilateral surgical drains. Gastroduodenal bypass device. No dilated loops of small bowel. Gaseous distention of the colon. Degenerative changes of the spine and bilateral femoroacetabular joints. Subsequently, a water-soluble contrast upper GI series was attempted. However, the patient was unable to swallow the contrast and the examination was terminated. IMPRESSION: Attempted water-soluble contrast upper GI series, as described. The patient was unable to swallow the contrast, and the examination was terminated. Electronically Signed   By: Kellie Simmering D.O.   On: 01/07/2022 12:57   DG Chest Port 1 View  Result Date: 01/04/2022 CLINICAL DATA:  Status post PICC placement EXAM: PORTABLE CHEST 1 VIEW COMPARISON:  12/31/2021 chest radiograph. FINDINGS: Left PICC terminates at the cavoatrial junction. Left internal  jugular central venous catheter terminates in the upper third of the SVC. Right internal jugular Port-A-Cath terminates at the cavoatrial junction. Intact sternotomy wires. Enteric tube enters stomach with the tip not seen on this image. Stable cardiomediastinal silhouette with mild cardiomegaly. No pneumothorax. Left retrocardiac opacity and small left pleural effusion, new. No right pleural effusion. No pulmonary edema. IMPRESSION: 1. Left PICC terminates at the cavoatrial junction. 2. New small left pleural effusion. New left retrocardiac opacity, favor atelectasis. 3. Stable mild cardiomegaly.  No pulmonary edema. Electronically Signed   By: Ilona Sorrel M.D.   On: 01/04/2022 16:40   [name] EKG SITE RITE  Result Date: 01/04/2022 If Site Rite image not attached, placement could not be confirmed due to current cardiac rhythm.  ECHOCARDIOGRAM COMPLETE  Result Date: 01/03/2022    ECHOCARDIOGRAM REPORT   Patient Name:   Dillon Smith Date of Exam: 01/03/2022 Medical Rec #:  [id number]        Height:       71.0 in Accession #:    [id number]       Weight:       186.7 lb Date of Birth:  June 07, 1930        BSA:          2.048 m Patient Age:    13 years         BP:           133/74 mmHg Patient Gender: M                HR:           97 bpm. Exam Location:  Inpatient Procedure: 2D Echo, Cardiac Doppler, Color Doppler and Strain Analysis Indications:    dyspnea  History:        Patient has prior history of Echocardiogram examinations, most                 recent 08/16/2019. CAD, Arrythmias:Atrial Fibrillation; Risk                 Factors:Hypertension.  Sonographer:    Joette Catching RCS Referring Phys: [id number] Hortencia Conradi MEIER  Sonographer Comments: Global longitudinal strain was attempted. IMPRESSIONS  1. Left ventricular ejection fraction, by estimation, is 25 to 30%. The left ventricle has severely decreased function. The left ventricle demonstrates global hypokinesis. Left ventricular diastolic parameters are  consistent with Grade II diastolic dysfunction (pseudonormalization). The average left ventricular global longitudinal strain is -8.0 %. The global longitudinal  strain is abnormal.  2. Right ventricular systolic function is mildly reduced. The right ventricular size is moderately enlarged. There is mildly elevated pulmonary artery systolic pressure. The estimated right ventricular systolic pressure is 34.7 mmHg.  3. Left atrial size was severely dilated.  4. Right atrial size was moderately dilated.  5. The mitral valve is normal in structure. Moderate mitral valve regurgitation. No evidence of mitral stenosis.  6. Tricuspid valve regurgitation is moderate.  7. The aortic valve is tricuspid. There is mild calcification of the aortic valve. There is mild thickening of the aortic valve. Aortic valve regurgitation is severe. Aortic valve sclerosis is present, with no evidence of aortic valve stenosis.  8. The inferior vena cava is normal in size with greater than 50% respiratory variability, suggesting right atrial pressure of 3 mmHg. Comparison(s): Prior images reviewed side by side. FINDINGS  Left Ventricle: Left ventricular ejection fraction, by estimation, is 25 to 30%. The left ventricle has severely decreased function. The left ventricle demonstrates global hypokinesis. The average left ventricular global longitudinal strain is -8.0 %. The global longitudinal strain is abnormal. The left ventricular internal cavity size was normal in size. There is no left ventricular hypertrophy. Left ventricular diastolic parameters are consistent with Grade II diastolic dysfunction (pseudonormalization).  LV Wall Scoring: The mid anteroseptal segment and mid inferoseptal segment are akinetic. Right Ventricle: The right ventricular size is moderately enlarged. No increase in right ventricular wall thickness. Right ventricular systolic function is mildly reduced. There is mildly elevated pulmonary artery systolic pressure. The  tricuspid regurgitant velocity is 3.06 m/s, and with an assumed right atrial pressure of 3 mmHg, the estimated right ventricular systolic pressure is 42.5 mmHg. Left Atrium: Left atrial size was severely dilated. Right Atrium: Right atrial size was moderately dilated. Pericardium: There is no evidence of pericardial effusion. Mitral Valve: The mitral valve is normal in structure. Moderate mitral valve regurgitation. No evidence of mitral valve stenosis. Tricuspid Valve: The tricuspid valve is normal in structure. Tricuspid valve regurgitation is moderate . No evidence of tricuspid stenosis. Aortic Valve: The aortic valve is tricuspid. There is mild calcification of the aortic valve. There is mild thickening of the aortic valve. Aortic valve regurgitation is severe. Aortic regurgitation PHT measures 648 msec. Aortic valve sclerosis is present, with no evidence of aortic valve stenosis. Aortic valve mean gradient measures 3.5 mmHg. Aortic valve peak gradient measures 6.3 mmHg. Aortic valve area, by VTI measures 2.74 cm. Pulmonic Valve: The pulmonic valve was normal in structure. Pulmonic valve regurgitation is trivial. No evidence of pulmonic stenosis. Aorta: The aortic root is normal in size and structure. Venous: The inferior vena cava is normal in size with greater than 50% respiratory variability, suggesting right atrial pressure of 3 mmHg. IAS/Shunts: No atrial level shunt detected by color flow Doppler.  LEFT VENTRICLE PLAX 2D LVIDd:         5.90 cm      Diastology LVIDs:         4.90 cm      LV e' medial:    7.72 cm/s LV PW:         1.00 cm      LV E/e' medial:  11.5 LV IVS:        1.00 cm      LV e' lateral:   12.90 cm/s LVOT diam:     2.20 cm      LV E/e' lateral: 6.9 LV SV:  50 LV SV Index:   25           2D Longitudinal Strain LVOT Area:     3.80 cm     2D Strain GLS Avg:     -8.0 %  LV Volumes (MOD) LV vol d, MOD A2C: 138.0 ml LV vol d, MOD A4C: 181.0 ml LV vol s, MOD A2C: 127.0 ml LV vol s, MOD  A4C: 95.1 ml LV SV MOD A2C:     11.0 ml LV SV MOD A4C:     181.0 ml LV SV MOD BP:      46.3 ml RIGHT VENTRICLE RV Basal diam:  5.45 cm RV Mid diam:    3.70 cm RV S prime:     14.80 cm/s TAPSE (M-mode): 1.4 cm LEFT ATRIUM              Index        RIGHT ATRIUM           Index LA diam:        5.10 cm  2.49 cm/m   RA Area:     23.70 cm LA Vol (A2C):   156.0 ml 76.17 ml/m  RA Volume:   84.20 ml  41.11 ml/m LA Vol (A4C):   114.0 ml 55.67 ml/m LA Biplane Vol: 141.0 ml 68.85 ml/m  AORTIC VALVE                    PULMONIC VALVE AV Area (Vmax):    2.69 cm     PV Vmax:          0.80 m/s AV Area (Vmean):   2.79 cm     PV Peak grad:     2.5 mmHg AV Area (VTI):     2.74 cm     PR End Diast Vel: 10.98 msec AV Vmax:           125.10 cm/s AV Vmean:          79.200 cm/s AV VTI:            0.183 m AV Peak Grad:      6.3 mmHg AV Mean Grad:      3.5 mmHg LVOT Vmax:         88.55 cm/s LVOT Vmean:        58.050 cm/s LVOT VTI:          0.132 m LVOT/AV VTI ratio: 0.72 AI PHT:            648 msec  AORTA Ao Root diam: 3.90 cm Ao Asc diam:  3.90 cm MITRAL VALVE                  TRICUSPID VALVE MV Area (PHT): 4.06 cm       TR Peak grad:   37.5 mmHg MV Decel Time: 187 msec       TR Vmax:        306.00 cm/s MR Peak grad:    112.4 mmHg MR Mean grad:    68.0 mmHg    SHUNTS MR Vmax:         530.00 cm/s  Systemic VTI:  0.13 m MR Vmean:        388.0 cm/s   Systemic Diam: 2.20 cm MR PISA:         0.57 cm MR PISA Eff ROA: 4 mm MR PISA Radius:  0.30 cm MV E velocity: 88.70 cm/s MV A velocity: 29.10 cm/s MV E/A  ratio:  3.05 Candee Furbish MD Electronically signed by Candee Furbish MD Signature Date/Time: 01/03/2022/1:04:41 PM    Final    DG Abd 1 View  Result Date: 01/01/2022 CLINICAL DATA:  Feeding tube placement. EXAM: ABDOMEN - 1 VIEW COMPARISON:  Radiograph yesterday. FINDINGS: The enteric tube has been advanced, tip and side-port are below the diaphragm. Abdominal drains remain in place. IMPRESSION: Enteric tube with tip and side-port below  the diaphragm in the stomach. Electronically Signed   By: Keith Rake M.D.   On: 01/01/2022 00:52   DG Abd 1 View  Result Date: 01/01/2022 CLINICAL DATA:  Feeding tube placement EXAM: ABDOMEN - 1 VIEW COMPARISON:  CT 12/31/2021 FINDINGS: Esophageal tube tip overlies the stomach, side-port near the GE junction. Probable drains over the upper abdomen. Faintly visible gastrojejunal stent. Upper gas pattern is unobstructed. IMPRESSION: Esophageal tube tip overlies stomach, side-port in the region of GE junction, further advancement could be considered for more optimal positioning Electronically Signed   By: Donavan Foil M.D.   On: 01/01/2022 00:11   CT Angio Abd/Pel W and/or Wo Contrast  Result Date: 12/31/2021 CLINICAL DATA:  Mesenteric ischemia, abdominal pain EXAM: CTA ABDOMEN AND PELVIS WITHOUT AND WITH CONTRAST TECHNIQUE: Multidetector CT imaging of the abdomen and pelvis was performed using the standard protocol during bolus administration of intravenous contrast. Multiplanar reconstructed images and MIPs were obtained and reviewed to evaluate the vascular anatomy. RADIATION DOSE REDUCTION: This exam was performed according to the departmental dose-optimization program which includes automated exposure control, adjustment of the mA and/or kV according to patient size and/or use of iterative reconstruction technique. CONTRAST:  5m OMNIPAQUE IOHEXOL 350 MG/ML SOLN COMPARISON:  CT 12/22/2021 FINDINGS: VASCULAR Aorta: Moderate calcified atheromatous plaque throughout. No dissection or stenosis. Fusiform 3.7 cm infrarenal aneurysm tapering to normal caliber above the bifurcation nonocclusive mural thrombus in the aneurysmal segment. Celiac: Calcified ostial plaque resulting in short segment stenosis of at least mild hemodynamic significance. Branch vessels unremarkable. SMA: Calcified ostial plaque resulting in short segment stenosis of at least mild severity, mildly atheromatous but patent distally.  Renals: Single left, with partially calcified ostial plaque resulting in short segment stenosis of at least mild severity, patent distally. Single right, mildly atheromatous but patent. IMA: Patent, arising from the distal aspect of the aneurysmal segment. Inflow: Fusiform dilatation of distal right common iliac artery up to 2 cm diameter, left 1.4 cm. Moderate scattered calcified atheromatous plaque throughout bilateral iliac arterial systems. Proximal Outflow: Atheromatous, patent Veins: Patent hepatic veins, portal vein, SMV, splenic vein, bilateral renal veins. No mesenteric or portal venous gas. Review of the MIP images confirms the above findings. NON-VASCULAR Lower chest: No pleural or pericardial effusion. Minimal interstitial infiltrate or dependent atelectasis posteriorly at the right lung base. Sternotomy wires. Central venous catheter extends to the proximal right atrium. Hepatobiliary: 1.4 cm hyperdense lesion in hepatic segment 5. Gallbladder nondistended. No calcified gallstones. No biliary ductal dilatation. Pancreas: Unremarkable. No pancreatic ductal dilatation or surrounding inflammatory changes. Spleen: Normal in size without focal abnormality. Adrenals/Urinary Tract: 3.2 cm enhancing left adrenal mass, stable. Bilateral renal cysts stable. No hydronephrosis. Urinary bladder physiologically distended. Stomach/Bowel: Stomach is decompressed. Patent gastroduodenal bypass device. Poorly marginated 3.6 cm enhancing antral mass. The duodenum is decompressed. Small bowel is nondilated. Normal appendix. The colon is incompletely distended, unremarkable. No pneumatosis. No mesenteric venous gas. Lymphatic: No abdominal or pelvic adenopathy. Reproductive: Metallic seeds throughout the prostate. Other: Small volume abdominal and pelvic ascites. Scattered free air in  the anterior upper abdomen. Musculoskeletal: Spondylitic changes in the lower lumbar spine. IMPRESSION: 1. Positive for free intraperitoneal  air, consistent with perforated viscus, site indeterminate. Critical Value/emergent results were called by telephone at the time of interpretation on 12/31/2021 at 1:57 pm to provider [facility] , who verbally acknowledged these results. 2. 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 3. 3.7 cm infrarenal abdominal aortic aneurysm. Consensus guidelines recommend follow-up ultrasound every 2 years. Reference: Joellyn Rued Radiol 2013; 10:789-794. Electronically Signed   By: Lucrezia Europe M.D.   On: 12/31/2021 14:03   DG Chest Portable 1 View  Result Date: 12/31/2021 CLINICAL DATA:  Provided history: Epigastric pain, rule out infection. EXAM: PORTABLE CHEST 1 VIEW COMPARISON:  Chest radiograph 09/12/2021 (images available, report unavailable). FINDINGS: Right chest infusion port catheter with tip projecting at the level of the superior cavoatrial junction. Prior median sternotomy/CABG. Heart size at the upper limits of normal. Heart size at the upper limits of normal. No appreciable airspace consolidation or pulmonary edema. No evidence of pleural effusion or pneumothorax. No acute bony abnormality identified. IMPRESSION: Right chest infusion port catheter with tip at the level of the superior cavoatrial junction. No evidence of acute cardiopulmonary abnormality. Aortic Atherosclerosis (ICD10-I70.0). Electronically Signed   By: Kellie Simmering D.O.   On: 12/31/2021 11:58   CT ABDOMEN PELVIS W CONTRAST  Result Date: 12/22/2021 CLINICAL DATA:  Decreased bowel movements over the past 2 days, history of gastric cancer, possible obstructive change. EXAM: CT ABDOMEN AND PELVIS WITH CONTRAST TECHNIQUE: Multidetector CT imaging of the abdomen and pelvis was performed using the standard protocol following bolus administration of intravenous contrast. RADIATION DOSE REDUCTION: This exam was performed according to the departmental dose-optimization program which includes automated exposure  control, adjustment of the mA and/or kV according to patient size and/or use of iterative reconstruction technique. CONTRAST:  32m OMNIPAQUE IOHEXOL 300 MG/ML  SOLN COMPARISON:  10/18/2021 FINDINGS: Lower chest: No acute abnormality. Hepatobiliary: No focal liver abnormality is seen. No gallstones, gallbladder wall thickening, or biliary dilatation. Pancreas: Unremarkable. No pancreatic ductal dilatation or surrounding inflammatory changes. Spleen: Normal in size without focal abnormality. Adrenals/Urinary Tract: Right adrenal gland is within normal limits. Left adrenal gland again demonstrates a mixed attenuation lesion measuring up to 2.8 cm stable from recent PET-CT. This showed no hypermetabolic activity on prior CT. Kidneys demonstrate no renal calculi or obstructive changes. Multiple benign appearing simple cysts are noted. The largest of these is noted on the left measuring 6.5 cm. No further follow-up is recommended. The bladder is well distended. Stomach/Bowel: Scattered fecal material is noted throughout the colon. No obstructive or inflammatory changes are seen. The appendix is within normal limits without inflammatory change. Small bowel is unremarkable. The stomach is well distended with fluid. A gastrojejunostomy stent is noted in satisfactory position and appears widely patent. The distal aspect of the stomach demonstrates enhancement and wall thickening consistent with the known history of gastric antral cancer. Vascular/Lymphatic: Aortic atherosclerosis. No enlarged abdominal or pelvic lymph nodes. Dilatation of the infrarenal aorta is noted to 3.7 cm stable in appearance from prior PET-CT. Reproductive: Prostate brachytherapy seeds are noted. Other: No abdominal wall hernia or abnormality. No abdominopelvic ascites. Musculoskeletal: No acute or significant osseous findings. IMPRESSION: Stable appearing obstructing distal gastric lesion with stent based gastrojejunostomy which appears widely  patent. 3.7 cm infrarenal abdominal aortic aneurysm. Recommend follow-up every 2 years. Reference: J Am Coll Radiol 27026;37:858-850 2.8 cm left adrenal  lesion stable from the prior PET-CT. No increased metabolic activity is noted on prior PET-CT. No new focal abnormality is noted. Electronically Signed   By: Inez Catalina M.D.   On: 12/22/2021 19:17     Future Appointments  Date Time Provider [place]  01/24/2022  8:30 AM [facility] [place] None  01/24/2022  9:00 AM [name], MD [facility] None  01/24/2022 10:00 AM [facility] INFUSION [facility] None  01/24/2022 11:15 AM Morrell Riddle, RD [facility] None  01/26/2022 10:30 AM [facility] [place] FLUSH [facility] None      LOS: 10 days   Addendum  I have seen the patient, examined him. I agree with the assessment and and plan and have edited the notes.   Dillon Smith was admitted for pneumoperitoneum secondary to gastric perforation, status post exploratory laparoscope drainage of intra-abdominal abscess on July 3.  He is recovering slowly.  He is slightly disoriented, but awake and alert and able to answer questions. I called his daughter Dillon Smith to update her also.   Given the complication from cancer and chemo, especially intra-abdominal abscess, we will hold on his neoadjuvant chemotherapy, and will unlikely give any more before his surgery. Will discuss with Dr. [name] about his surgical plan. I am still hopeful that he can make to surgical resection of his gastric cancer. We will f/u as needed in hospital, and plan to see him back after discharge.   [name] MD  01/10/2022

## 2022-01-10 NOTE — Progress Notes (Signed)
PHARMACY - TOTAL PARENTERAL NUTRITION CONSULT NOTE   Indication: Prolonged ileus Pneumoperitoneum in the setting of known gastric cancer on chemotherapy s/p ex lap drainage of intra-abdominal abscess  Patient Measurements: Height: '5\' 11"'$  (180.3 cm) Weight: 89.6 kg (197 lb 8.5 oz) IBW/kg (Calculated) : 75.3 TPN AdjBW (KG): 84.7 Body mass index is 27.55 kg/m.  Assessment: 88 yo male with perforated gastric cancer on chemotherapy s/p ex lap with intraabdominal abscess drainage 7/3. PMH includes Afib on eliquis, CKDIII, HTN, prostate cancer, CVA, and HLD. Pharmacy consulted to start TPN for possible prolonged ileus.  Glucose / Insulin: CBGs <180.   - 11 units SSI used in past 24hr Electrolytes: WNL Renal: AKI on CKDIII. SCr up slightly from 1.36 to 1.45. BUN 39 Hepatic: AST/ALT improved to WNL (7/10), Tbili WNL (7/10), Alb low 1.7, TG 52 (7/10) Intake / Output: +218m much improved last 24h after concentrating TPN and reducing volume. UOP 2.565L, now on Lasix '40mg'$  IV daily  MIVF: stopped per Cardiology  GI Imaging: 7/3 CT intra-abdominal free air which was concerning for bowel perforation GI Surgeries / Procedures:  7/3 ex lap, drainage of intraabdominal abscess  Central access: PICC 7/7, Implanted port TPN start date: 7/7  Nutritional Goals: Goal TPN rate is 75 mL/hr (provides 124 g of protein and 2344 kcals per day) - reformulated on 7/12 to decrease total volume  RD Assessment: Estimated Needs Total Energy Estimated Needs: 2300-2500 kcal Total Protein Estimated Needs: 110-125 grams Total Fluid Estimated Needs: >/= 2.3 L/day  Current Nutrition:  Clear liquid diet, TPN  Plan:  Noted concern for volume overload and several infusions changed to PO medications. Daily IV lasix started per Cardiology. Per discussion with CCS, to continue full rate TPN for now. Will reformulate TPN today to decrease total volume of TPN and still meet nutritional goals (adjust amino acid component  to more concentrated formulation)  At 1800, TPN at goal rate will be 713mhr  Electrolytes in TPN:  Na 10 mEq/L K 5057mL Ca 0 mEq/L Mg 5mE35m Phos 15mm49m Cl:Ac max acetate Add standard MVI and trace elements to TPN Continue Moderate q4h SSI and adjust as needed Monitor TPN labs on Mon/Thurs and PRN Follow up for advancement of diet and ability to wean TPN Note that if fluids need to be restricted further, consideration needs to be made for patient to not meet 100% nutritional goals, but so far since new TPN has been started yesterday at 6pm, I/O's much improved. Will continue to monitor closely   JustiAdrian SaranrmD, BCPS Secure Chat if ?s 01/10/2022 10:41 AM

## 2022-01-10 NOTE — Progress Notes (Signed)
SLP Cancellation Note  Patient Details Name: Dillon Smith MRN: [id number] DOB: 11/29/1929   Cancelled treatment:       Reason Eval/Treat Not Completed: Other (comment);Patient at procedure or test/unavailable (Nurse present in room to place PICC, pt sleeping at this time, will follow up am 7/14/20223) Kathleen Lime, MS [facility] SLP Acute Rehab Services Office [phone] Pager [phone]   [name] 01/10/2022, 5:35 PM

## 2022-01-10 NOTE — Progress Notes (Signed)
Inpatient Rehab Admissions Coordinator:  Consult received. Per NSG recommendation, called pt's daughter [name] to discuss CIR goals and expectations. Received no answer. Left a message; awaiting return call. Will continue to follow.   Gayland Curry, [place], [place] Admissions Coordinator [phone]

## 2022-01-10 NOTE — Plan of Care (Signed)
  Problem: Pain Managment: Goal: General experience of comfort will improve Outcome: Adequate for Discharge   Problem: Safety: Goal: Ability to remain free from injury will improve Outcome: Not Progressing   Problem: Skin Integrity: Goal: Risk for impaired skin integrity will decrease Outcome: Not Progressing   Problem: Coping: Goal: Ability to adjust to condition or change in health will improve Outcome: Progressing   Problem: Fluid Volume: Goal: Ability to maintain a balanced intake and output will improve Outcome: Progressing   Problem: Safety: Goal: Ability to remain free from injury will improve Outcome: Not Progressing   Problem: Skin Integrity: Goal: Risk for impaired skin integrity will decrease Outcome: Not Progressing   Problem: Coping: Goal: Ability to adjust to condition or change in health will improve Outcome: Progressing   Problem: Fluid Volume: Goal: Ability to maintain a balanced intake and output will improve Outcome: Progressing

## 2022-01-11 ENCOUNTER — Inpatient Hospital Stay (HOSPITAL_COMMUNITY): Payer: Medicare Other

## 2022-01-11 DIAGNOSIS — K255 Chronic or unspecified gastric ulcer with perforation: Secondary | ICD-10-CM | POA: Diagnosis not present

## 2022-01-11 DIAGNOSIS — I5023 Acute on chronic systolic (congestive) heart failure: Secondary | ICD-10-CM | POA: Diagnosis not present

## 2022-01-11 DIAGNOSIS — I4819 Other persistent atrial fibrillation: Secondary | ICD-10-CM | POA: Diagnosis not present

## 2022-01-11 LAB — COMPREHENSIVE METABOLIC PANEL
ALT: 42 U/L (ref 0–44)
AST: 39 U/L (ref 15–41)
Albumin: 1.8 g/dL — ABNORMAL LOW (ref 3.5–5.0)
Alkaline Phosphatase: 72 U/L (ref 38–126)
Anion gap: 6 (ref 5–15)
BUN: 42 mg/dL — ABNORMAL HIGH (ref 8–23)
CO2: 27 mmol/L (ref 22–32)
Calcium: 8.5 mg/dL — ABNORMAL LOW (ref 8.9–10.3)
Chloride: 109 mmol/L (ref 98–111)
Creatinine, Ser: 1.45 mg/dL — ABNORMAL HIGH (ref 0.61–1.24)
GFR, Estimated: 46 mL/min — ABNORMAL LOW (ref >60)
Glucose, Bld: 137 mg/dL — ABNORMAL HIGH (ref 70–99)
Potassium: 4.1 mmol/L (ref 3.5–5.1)
Sodium: 142 mmol/L (ref 135–145)
Total Bilirubin: 0.5 mg/dL (ref 0.3–1.2)
Total Protein: 6.9 g/dL (ref 6.5–8.1)

## 2022-01-11 LAB — GLUCOSE, CAPILLARY
Glucose-Capillary: 144 mg/dL — ABNORMAL HIGH (ref 70–99)
Glucose-Capillary: 140 mg/dL — ABNORMAL HIGH (ref 70–99)
Glucose-Capillary: 121 mg/dL — ABNORMAL HIGH (ref 70–99)
Glucose-Capillary: 103 mg/dL — ABNORMAL HIGH (ref 70–99)
Glucose-Capillary: 93 mg/dL (ref 70–99)
Glucose-Capillary: 121 mg/dL — ABNORMAL HIGH (ref 70–99)

## 2022-01-11 LAB — CBC
HCT: 27.7 % — ABNORMAL LOW (ref 39.0–52.0)
Hemoglobin: 9.1 g/dL — ABNORMAL LOW (ref 13.0–17.0)
MCH: 30.2 pg (ref 26.0–34.0)
MCHC: 32.9 g/dL (ref 30.0–36.0)
MCV: 92 fL (ref 80.0–100.0)
Platelets: 233 10*3/uL (ref 150–400)
RBC: 3.01 MIL/uL — ABNORMAL LOW (ref 4.22–5.81)
RDW: 15.2 % (ref 11.5–15.5)
WBC: 5.4 10*3/uL (ref 4.0–10.5)
nRBC: 0 % (ref 0.0–0.2)

## 2022-01-11 LAB — PHOSPHORUS: Phosphorus: 4 mg/dL (ref 2.5–4.6)

## 2022-01-11 LAB — MAGNESIUM: Magnesium: 2 mg/dL (ref 1.7–2.4)

## 2022-01-11 MED ORDER — ALTEPLASE 2 MG IJ SOLR
2.0000 mg | Freq: Once | INTRAMUSCULAR | Status: AC
Start: 2022-01-11 — End: 2022-01-11
  Administered 2022-01-11: 2 mg
  Filled 2022-01-11: qty 2

## 2022-01-11 MED ORDER — HEPARIN SOD (PORK) LOCK FLUSH 100 UNIT/ML IV SOLN
500.0000 [IU] | Freq: Once | INTRAVENOUS | Status: AC
  Administered 2022-01-11: 500 [IU]
  Filled 2022-01-11: qty 5

## 2022-01-11 MED ORDER — PROSOURCE TF PO LIQD
45.0000 mL | Freq: Two times a day (BID) | ORAL | Status: DC
Start: 2022-01-11 — End: 2022-01-16
  Administered 2022-01-11 – 2022-01-15 (×6): 45 mL
  Filled 2022-01-11 (×10): qty 45

## 2022-01-11 MED ORDER — FREE WATER
100.0000 mL | Freq: Three times a day (TID) | Status: DC
  Administered 2022-01-11 – 2022-01-16 (×13): 100 mL

## 2022-01-11 MED ORDER — OSMOLITE 1.5 CAL PO LIQD
1000.0000 mL | ORAL | Status: DC
  Administered 2022-01-11 – 2022-01-13 (×3): 1000 mL
  Filled 2022-01-11 (×6): qty 1000

## 2022-01-11 MED ORDER — FUROSEMIDE 10 MG/ML IJ SOLN
80.0000 mg | Freq: Two times a day (BID) | INTRAMUSCULAR | Status: DC
  Administered 2022-01-11 – 2022-01-12 (×2): 80 mg via INTRAVENOUS
  Filled 2022-01-11 (×2): qty 8

## 2022-01-11 MED ORDER — TRACE MINERALS CU-MN-SE-ZN 300-55-60-3000 MCG/ML IV SOLN
INTRAVENOUS | Status: DC
  Filled 2022-01-11: qty 607.2

## 2022-01-11 MED ORDER — INSULIN ASPART 100 UNIT/ML IJ SOLN
0.0000 [IU] | Freq: Three times a day (TID) | INTRAMUSCULAR | Status: DC
  Administered 2022-01-12 – 2022-01-15 (×4): 1 [IU] via SUBCUTANEOUS

## 2022-01-11 NOTE — Progress Notes (Addendum)
Speech Language Pathology Treatment: Dysphagia  Patient Details Name: Dillon Smith MRN: [id number] DOB: 11/29/1929 Today's Date: 01/11/2022 Time: 1700-1749 SLP Time Calculation (min) (ACUTE ONLY): 20 min  Assessment / Plan / Recommendation Clinical Impression  Pt seen for follow up to assess diet tolerance and determine readiness to advance solids. Pt awake and alert. No family present. Hearing aids appear to be working better today - pt was much easier to communicate with. Pt was agreeable to trying advanced solid textures. Throat clearing noted in the absence of PO trials. When asked why he did this, pt responded "I don't know, habit?". Bilateral mitts were removed for PO trials. Pt was able to take several bites of a [place] sandwich from floor stock. No change in voice quality, and no cough elicited. Pt took sips of water without cough response. Throat clear is occasionally wet sounding, but then voicing was clear. Pt was noted to have frequent belching during trials. Esophageal dysmotility is suspected, which may be contributing to habitual throat clearing. Will advance diet to dys 2/thin liquids and follow for tolerance. Pt continues to be afebrile, lungs diminished. RN, MD, and PA informed.   HPI HPI: 88yo male admitted 12/31/21 with abdominal pain. PMH: HTN, prostate cancer, HLD, AFib, CKD3, CVA, CAD s/p CABG, current gastric adenocarcinoma undergoing chemotherapy.      SLP Plan  Continue with current plan of care      Recommendations for follow up therapy are one component of a multi-disciplinary discharge planning process, led by the attending physician.  Recommendations may be updated based on patient status, additional functional criteria and insurance authorization.    Recommendations  Diet recommendations: Dysphagia 2 (fine chop);Thin liquid Liquids provided via: Cup;Straw Medication Administration: Whole meds with liquid Supervision: Patient able to self feed;Staff to assist  with self feeding;Intermittent supervision to cue for compensatory strategies Compensations: Minimize environmental distractions;Slow rate;Small sips/bites Postural Changes and/or Swallow Maneuvers: Seated upright 90 degrees;Upright 30-60 min after meal                Oral Care Recommendations: Oral care BID Follow Up Recommendations: Follow physician's recommendations for discharge plan and follow up therapies Assistance recommended at discharge: Intermittent Supervision/Assistance SLP Visit Diagnosis: Dysphagia, unspecified (R13.10) Plan: Continue with current plan of care          Shekera Beavers B. Quentin Ore, [facility], [place] Speech Language Pathologist Office: [phone]  Shonna Chock 01/11/2022, 2:48 PM

## 2022-01-11 NOTE — Progress Notes (Addendum)
PHARMACY - TOTAL PARENTERAL NUTRITION CONSULT NOTE   Indication: Prolonged ileus Pneumoperitoneum in the setting of known gastric cancer on chemotherapy s/p ex lap drainage of intra-abdominal abscess  Patient Measurements: Height: '5\' 11"'$  (180.3 cm) Weight: 86.4 kg (190 lb 7.6 oz) IBW/kg (Calculated) : 75.3 TPN AdjBW (KG): 84.7 Body mass index is 26.57 kg/m.  Assessment: 88 yo male with perforated gastric cancer on chemotherapy s/p ex lap with intraabdominal abscess, drainage 7/3. PMH includes Afib on eliquis, CKDIII, HTN, prostate cancer, CVA, and HLD. Pharmacy consulted to manage TPN for prolonged ileus.   Glucose / Insulin: No hx DM.  -CBG goal <180. Range: 137-148 -On mSSI q4h, required 12 units SSI/24 hrs Electrolytes: WNL, but CorrCa (10.3) on upper end of normal with Ca removed from TPN Renal: AKI on CKDIII. SCr 1.45 - elevated but stable compared to yesterday. BUN trending up. Hepatic: AST/ALT improved to WNL (7/14), Tbili WNL (7/14), Alb low 1.8, TG WNL Intake / Output: Strict I/O not measured.  -UOP: 2000 mL + 1 unmeasured -Stool: unmeasured x3 -Drain: 10 mL *Pt is volume overloaded on furosemide 80 mg IV BID. TPN was concentrated on 7/12 to reduce total volume MIVF: stopped per Cardiology  GI Imaging: 7/3 CT intra-abdominal free air which was concerning for bowel perforation GI Surgeries / Procedures:  7/3 ex lap, drainage of intraabdominal abscess  Central access: PICC 7/7, Implanted port TPN start date: 7/7  Nutritional Goals: Goal TPN rate is 75 mL/hr (provides 124 g of protein and 2344 kcals per day) - reformulated on 7/12 to decrease total volume  RD Assessment: Estimated Needs Total Energy Estimated Needs: 2300-2500 kcal Total Protein Estimated Needs: 110-125 grams Total Fluid Estimated Needs: >/= 2.3 L/day  Current Nutrition:  Clear liquid diet, TPN -TPN concentrated on 7/12 to reduce total volume infusing  Plan:  Patient remains on full dose TPN and  is volume overloaded requiring increasing doses of IV diuretics. Taking PO medications, including apixaban as well as Ensure this morning. Pending speech eval to advance diet.  Inquired about reducing TPN and not targeting 100% of needs to reduce total volume. Per CCS, ok to reduce to target ~75% of needs.  At 1800: Reduce TPN to 55 mL/hr Electrolytes in TPN: No changes Na 10 mEq/L K 61mq/L Ca 0 mEq/L Mg 556m/L Phos 1566m/L Cl:Ac max acetate Add standard MVI and trace elements to TPN Continue Moderate q4h SSI and adjust as needed Monitor TPN labs on Mon/Thurs. Recheck electrolytes with AM labs tomorrow.  Follow up for advancement of diet and ability to wean off of TPN.  MarLenis NoonharmD, BCPS 01/11/22 10:43 AM  Addendum: Discussed further with CCS. TPN to be weaned off today. Coretrack ordered.   Will reduce rate of TPN to 40 mL/hr and let it run until 1800. Will discontinue all TPN associated labs and monitoring. Will change CBG check and SSI to sSSI TID AC.   Pharmacy to sign off.   MarLenis NoonharmD 01/11/22 11:14 AM

## 2022-01-11 NOTE — Progress Notes (Signed)
Inpatient Rehab Admissions:  Inpatient Rehab Consult received.  I spoke with patient's daughter [name] on the telephone for rehabilitation assessment and to discuss goals and expectations of an inpatient rehab admission.  She acknowledged understanding of CIR goals and expectations. She would like pt to pursue CIR if appropriate. Will continue to monitor pt's participation and progress with therapies. Will discuss with physiatrists if pt could be considered for a reduced burden of care with discharge to SNF following CIR.    Signed: Gayland Curry, [place], [place] Admissions Coordinator [phone]

## 2022-01-11 NOTE — Progress Notes (Signed)
Nutrition Follow-up  DOCUMENTATION CODES:   Not applicable  INTERVENTION:  - continue Ensure TID.  - TPN weaning/discontinuance per Pharmacist and Surgery team.  - once small bore NGT placed and placement verified by abdominal x-ray, Osmolite 1.5 @ 35 ml/hr x18 hours/day (1700-1100) to advance by 10 ml every 18 hours of run time to goal rate of 65 ml/hr x18 hours/day with 45 ml Prosource TF BID and 100 ml free water TID.  - at goal rate, this regimen will provide 1835 kcal (80% kcal need), 95 grams protein (86% protein need), and 1191 ml free water.   NUTRITION DIAGNOSIS:   Increased nutrient needs related to cancer and cancer related treatments, post-op healing as evidenced by estimated needs. -ongoing  GOAL:   Patient will meet greater than or equal to 90% of their needs -unmet with current PO intake and decreased TPN rate.  MONITOR:   PO intake, TF tolerance, Labs, Weight trends  REASON FOR ASSESSMENT:   Consult Enteral/tube feeding initiation and management  ASSESSMENT:   88 year-old male with medical history of stage IIB gastric adenocarcinoma currently receiving chemo, Afib on eliquis, stage 3 CKD, HTN, prostate cancer, stroke, and HLD. He presented to the ED on 7/3 due to abdominal pain. In the ED, CT abdomen/pelvis showed intra-abdominal free air which was concerning for bowel perforation. On 7/3 he emergently underwent ex lap d/t perforation.  Patient laying in bed with no visitors present at the time of RD visit. TPN decreased from goal rate of 75 ml/hr to 40 ml/hr sometime today; Pharmacist's note at [phone] outlined plan to decrease to 55 ml/hr.  Calorie Count day #1 results: 7/13- 375 kcal and 11 grams protein total 7/14- 03 kcal and 19 grams protein total so far  Surgery team's note outlines plan for small bore NGT placement and initiation of TF so that TPN can be stopped and patient can receive adequate nutrition while PO intakes continue to improve.  Weight  today is +3 lb compared to yesterday. Non-pitting edema to all extremities documented in the edema section of flow sheet.    Labs reviewed; CBGs: 144, 140, 121, 103 mg/dl, BUN: 42 mg/dl, creatinine: 1.45 mg/dl, Ca: 8.5 mg/dl, GFR: 46 ml/min.  Medications reviewed; 80 mg IV lasix BID, sliding scale novolog, 40 mg IV protonix BID, 100 mg IV thiamine/day.    Diet Order:   Diet Order             Diet full liquid Room service appropriate? Yes; Fluid consistency: Thin  Diet effective now                   EDUCATION NEEDS:   No education needs have been identified at this time  Skin:  Skin Assessment: Skin Integrity Issues: Skin Integrity Issues:: Incisions Incisions: abdomen (7/3)  Last BM:  7/13 (type 6 x3, one large amount and 2 medium amounts)  Height:   Ht Readings from Last 1 Encounters:  12/31/21 '5\' 11"'$  (1.803 m)    Weight:   Wt Readings from Last 1 Encounters:  01/11/22 86.4 kg     BMI:  Body mass index is 26.57 kg/m.  Estimated Nutritional Needs:  Kcal:  2300-2500 kcal Protein:  110-125 grams Fluid:  >/= 2.3 L/day     Jarome Matin, MS, RD, LDN, CNSC Registered Dietitian II Inpatient Clinical Nutrition RD pager # and on-call/weekend pager # available in [facility]

## 2022-01-11 NOTE — Progress Notes (Signed)
Physical Therapy Treatment Patient Details Name: Dillon Smith MRN: [id number] DOB: 11/29/1929 Today's Date: 01/11/2022   History of Present Illness 88 yo male admitted with gastric perforation. S/P ex lap, drainage of abscess. Hx of gastric ca, ckd, bradycardia, covid, prostate ca, cva, afib.    PT Comments    +2 max/total assist for rolling and sidelying to sit. +2 max to stand from elevated bed with STEDY lift equipment. Pt performed seated BLE strengthening exercises. Pt is pleasant, puts forth good effort, he is very weak, SNF remains appropriate post-acute venue.     Recommendations for follow up therapy are one component of a multi-disciplinary discharge planning process, led by the attending physician.  Recommendations may be updated based on patient status, additional functional criteria and insurance authorization.  Follow Up Recommendations  Skilled nursing-short term rehab (<3 hours/day) Can patient physically be transported by private vehicle: No   Assistance Recommended at Discharge Frequent or constant Supervision/Assistance  Patient can return home with the following Two people to help with walking and/or transfers;Two people to help with bathing/dressing/bathroom;Assist for transportation;Help with stairs or ramp for entrance;Assistance with cooking/housework   Equipment Recommendations  None recommended by PT    Recommendations for Other Services       Precautions / Restrictions Precautions Precautions: Fall Precaution Comments: multiple lines, (2) jp drains, abd wound; incontinent Restrictions Weight Bearing Restrictions: No     Mobility  Bed Mobility Overal bed mobility: Needs Assistance Bed Mobility: Rolling, Sidelying to Sit Rolling: Max assist, +2 for physical assistance Sidelying to sit: +2 for physical assistance, Total assist       General bed mobility comments: . pt requires Total A x 2 (Pt 15%). Assist for trunk and bil LEs. Multimodal cueing  for initiation, technique. Utilized bedpad for scooting, positioning at EOB. Max A for static sitting balance with progressing to Min A with time.    Transfers Overall transfer level: Needs assistance Equipment used: Ambulation equipment used Transfers: Sit to/from Stand, Bed to chair/wheelchair/BSC Sit to Stand: +2 physical assistance, +2 safety/equipment, From elevated surface, Max assist           General transfer comment: used STEDY lift equipment, +2 max A to stand from elevated bed Transfer via Lift Equipment: Stedy  Ambulation/Gait                   Stairs             Wheelchair Mobility    Modified Rankin (Stroke Patients Only)       Balance Overall balance assessment: Needs assistance Sitting-balance support: Single extremity supported, Bilateral upper extremity supported, Feet supported, No upper extremity supported Sitting balance-Leahy Scale: Zero Sitting balance - Comments: initially Max A for sitting balance progressing to Min/mod A  with increased time. With cues for posture and correction Postural control: Right lateral lean, Posterior lean   Standing balance-Leahy Scale: Zero                              Cognition Arousal/Alertness: Awake/alert Behavior During Therapy: WFL for tasks assessed/performed Overall Cognitive Status: Impaired/Different from baseline Area of Impairment: Memory, Problem solving, Following commands, Attention, Safety/judgement, Awareness                     Memory: Decreased short-term memory Following Commands: Follows one step commands with increased time, Follows one step commands inconsistently Safety/Judgement: Decreased awareness of safety, Decreased awareness  of deficits   Problem Solving: Slow processing, Decreased initiation, Requires verbal cues, Requires tactile cues, Difficulty sequencing General Comments: pt pleasant, very quiet (daughter reports this is not baseline), multimodal  cues needed to complete tasks        Exercises General Exercises - Lower Extremity Ankle Circles/Pumps: AROM, Both, 10 reps, Seated Long Arc Quad: AAROM, Both, 10 reps, Seated Hip Flexion/Marching: Limitations, Seated Hip Flexion/Marching Limitations: attempted in sitting but pt not able to clear LEs from recliner    General Comments        Pertinent Vitals/Pain Pain Assessment Pain Score: 0-No pain Breathing: normal Negative Vocalization: none Facial Expression: smiling or inexpressive Body Language: relaxed Consolability: no need to console PAINAD Score: 0 Facial Expression: Relaxed, neutral Body Movements: Absence of movements Muscle Tension: Relaxed Pain Location: abdomen with mobility (pt stated he's not in pain but did grimace with movement) Pain Descriptors / Indicators: Grimacing, Guarding Pain Intervention(s): Monitored during session, Limited activity within patient's tolerance, Repositioned    Home Living                          Prior Function            PT Goals (current goals can now be found in the care plan section) Acute Rehab PT Goals Patient Stated Goal: ultimately, for pt to be able to return home PT Goal Formulation: With family Time For Goal Achievement: 01/22/22 Potential to Achieve Goals: Fair Progress towards PT goals: Progressing toward goals    Frequency    Min 2X/week      PT Plan Current plan remains appropriate    Co-evaluation PT/OT/SLP Co-Evaluation/Treatment: Yes Reason for Co-Treatment: Complexity of the patient's impairments (multi-system involvement);Necessary to address cognition/behavior during functional activity;For patient/therapist safety;To address functional/ADL transfers PT goals addressed during session: Mobility/safety with mobility;Balance;Strengthening/ROM        AM-PAC PT "6 Clicks" Mobility   Outcome Measure  Help needed turning from your back to your side while in a flat bed without using  bedrails?: Total Help needed moving from lying on your back to sitting on the side of a flat bed without using bedrails?: Total Help needed moving to and from a bed to a chair (including a wheelchair)?: Total Help needed standing up from a chair using your arms (e.g., wheelchair or bedside chair)?: Total Help needed to walk in hospital room?: Total Help needed climbing 3-5 steps with a railing? : Total 6 Click Score: 6    End of Session Equipment Utilized During Treatment: Gait belt Activity Tolerance: Patient limited by fatigue Patient left: in chair;with chair alarm set;with call bell/phone within reach;with family/visitor present Nurse Communication: Mobility status;Need for lift equipment PT Visit Diagnosis: Pain;Muscle weakness (generalized) (M62.81);Difficulty in walking, not elsewhere classified (R26.2)     Time: 9432-7614 PT Time Calculation (min) (ACUTE ONLY): 25 min  Charges:  $Therapeutic Activity: 8-22 mins                    Blondell Reveal Kistler PT 01/11/2022  Acute Rehabilitation Services  Office [phone]

## 2022-01-11 NOTE — Progress Notes (Signed)
Progress Note  Patient Name: Dillon Smith Date of Encounter: 01/11/2022  [facility] Cardiologist: Lauree Chandler, MD   Subjective   Denies any chest discomfort, no significant SOB. Hands in mittens.   Inpatient Medications    Scheduled Meds:  amiodarone  200 mg Oral Daily   apixaban  5 mg Oral BID   Chlorhexidine Gluconate Cloth  6 each Topical Daily   diphenhydrAMINE  25 mg Intravenous QHS   feeding supplement  237 mL Oral TID WC   fentaNYL  1 patch Transdermal Q72H   furosemide  40 mg Intravenous BID   insulin aspart  0-15 Units Subcutaneous Q4H   pantoprazole (PROTONIX) IV  40 mg Intravenous QHS   sodium chloride flush  10-40 mL Intracatheter Q12H   thiamine injection  100 mg Intravenous Daily   Continuous Infusions:  sodium chloride Stopped (01/04/22 1748)   TPN ADULT (ION) 75 mL/hr at 01/10/22 1735   PRN Meds: sodium chloride, bisacodyl, haloperidol lactate, heparin lock flush, HYDROmorphone (DILAUDID) injection, HYDROmorphone HCl, lip balm, ondansetron **OR** ondansetron (ZOFRAN) IV, mouth rinse, phenol, sodium chloride flush   Vital Signs    Vitals:   01/10/22 2009 01/11/22 0400 01/11/22 0500 01/11/22 0615  BP: 110/66   111/61  Pulse: 77   68  Resp: (!) 23 18    Temp: 97.9 F (36.6 C)   97.7 F (36.5 C)  TempSrc: Oral   Oral  SpO2: 100%   98%  Weight:   86.4 kg   Height:        Intake/Output Summary (Last 24 hours) at 01/11/2022 0814 Last data filed at 01/11/2022 0700 Gross per 24 hour  Intake 1962.72 ml  Output 2010 ml  Net -47.28 ml      01/11/2022    5:00 AM 01/10/2022    8:24 AM 01/04/2022    9:08 AM  Last 3 Weights  Weight (lbs) 190 lb 7.6 oz 186 lb 11.7 oz 197 lb 8.5 oz  Weight (kg) 86.4 kg 84.7 kg 89.6 kg      Telemetry    Atrial fibrillation with HR 80s - Personally Reviewed  ECG    Atrial fibrillation with HR 80s - Personally Reviewed  Physical Exam   GEN: No acute distress.  Hands in mittens Neck: No  JVD Cardiac: irregularly irregular, no murmurs, rubs, or gallops.  Respiratory: diminished breath sound in left base of the lung.  GI: Soft, nontender, non-distended  MS: No edema; No deformity. Neuro:  Nonfocal  Psych: Normal affect   Labs    High Sensitivity Troponin:   Recent Labs  Lab 12/31/21 0941 12/31/21 1143  TROPONINIHS 11 11     Chemistry Recent Labs  Lab 01/07/22 0430 01/08/22 0324 01/09/22 0506 01/09/22 0934 01/10/22 0336 01/11/22 0304  NA 148*   < > 142  --  142 142  K 4.2   < > 4.0  --  4.1 4.1  CL 117*   < > 112*  --  111 109  CO2 26   < > 25  --  26 27  GLUCOSE 150*   < > 167*  --  123* 137*  BUN 38*   < > 36*  --  39* 42*  CREATININE 1.50*   < > 1.36*  --  1.45* 1.45*  CALCIUM 8.7*   < > 8.0*  --  8.3* 8.5*  MG 2.1   < >  --  1.7 1.8 2.0  PROT 5.6*  --   --   --  6.5 6.9  ALBUMIN 1.8*  --   --  1.7* 1.6* 1.8*  AST 30  --   --   --  38 39  ALT 36  --   --   --  40 42  ALKPHOS 84  --   --   --  71 72  BILITOT 0.7  --   --   --  0.8 0.5  GFRNONAA 44*   < > 49*  --  46* 46*  ANIONGAP 5   < > 5  --  5 6   < > = values in this interval not displayed.    Lipids  Recent Labs  Lab 01/10/22 0336  TRIG 69    Hematology Recent Labs  Lab 01/07/22 0430 01/09/22 0506 01/10/22 0336  WBC 5.7 5.1 4.6  RBC 3.11* 2.91* 2.87*  HGB 9.6* 9.0* 8.7*  HCT 28.4* 26.9* 26.2*  MCV 91.3 92.4 91.3  MCH 30.9 30.9 30.3  MCHC 33.8 33.5 33.2  RDW 14.9 14.9 15.1  PLT 103* 159 190   Thyroid No results for input(s): "TSH", "FREET4" in the last 168 hours.  BNP Recent Labs  Lab 01/09/22 0506  BNP 482.5*    DDimer No results for input(s): "DDIMER" in the last 168 hours.   Radiology    No results found.  Cardiac Studies   Echo 01/03/2022  1. Left ventricular ejection fraction, by estimation, is 25 to 30%. The  left ventricle has severely decreased function. The left ventricle  demonstrates global hypokinesis. Left ventricular diastolic parameters are   consistent with Grade II diastolic  dysfunction (pseudonormalization). The average left ventricular global  longitudinal strain is -8.0 %. The global longitudinal strain is abnormal.   2. Right ventricular systolic function is mildly reduced. The right  ventricular size is moderately enlarged. There is mildly elevated  pulmonary artery systolic pressure. The estimated right ventricular  systolic pressure is 93.7 mmHg.   3. Left atrial size was severely dilated.   4. Right atrial size was moderately dilated.   5. The mitral valve is normal in structure. Moderate mitral valve  regurgitation. No evidence of mitral stenosis.   6. Tricuspid valve regurgitation is moderate.   7. The aortic valve is tricuspid. There is mild calcification of the  aortic valve. There is mild thickening of the aortic valve. Aortic valve  regurgitation is severe. Aortic valve sclerosis is present, with no  evidence of aortic valve stenosis.   8. The inferior vena cava is normal in size with greater than 50%  respiratory variability, suggesting right atrial pressure of 3 mmHg.   Comparison(s): Prior images reviewed side by side.   Patient Profile     88 y.o. male with PMH of gastric CA diagnosed 08/2021, longstanding persistent A-fib, CVA in 2021, hypertension, prostate cancer, hyperlipidemia, CKD stage III, and CAD status post CABG 2007 admitted with pneumoperitoneum and intra-abdominal abscess s/p ex lap drainage, cardiology service consulted for A-fib.  Echocardiogram obtained on 01/03/2022 showed EF 25 to 30% which is down from the previous 45-50% back in 2022.  Assessment & Plan    Longstanding persistent atrial fibrillation  - off of home beta blocker (questionable compliance as patient mentioned on admission that he was not taking it), on PO amiodarone (deescalated from IV amiodarone) started during this admission. SBP 110s. Will continue amiodarone for rate control  LV dysfunction with volume overload  - no  recent chest pain, EF down to 25% from the previous 45% in 2021  -  I/O +12L, persistent L pleural effusion on exam. No significant LE edema. IV lasix increased to '40mg'$  BID since yesterday.   - GDMT limited by BP, once BP improve, plan to add back either coreg or toprol XL. Not candidate for any invasive study. Plan medical management.   Pneumoperitoneum: s/p ex lap drainage  Recently diagnosed gastric cancer: neoadjuvant chemo on hold at this time, followed by hem/onc  CAD s/p CABG      For questions or updates, please contact [place] Please consult [email or web address] for contact info under        Signed, Almyra Deforest, [place]  01/11/2022, 8:14 AM

## 2022-01-11 NOTE — Progress Notes (Signed)
Daily Progress Note   Patient Name: Dillon Smith       Date: 01/11/2022 DOB: 07/15/1929  Age: 88 y.o. MRN#: [id number] Attending Physician: Nolon Nations, MD Primary Care Physician: [name] Admit Date: 12/31/2021  Reason for Consultation/Follow-up: Establishing goals of care  Subjective: Continues to be awake and alert, responding appropriately, PO intake improving as per daughter jean at bedside.     Length of Stay: 11  Current Medications: Scheduled Meds:   amiodarone  200 mg Oral Daily   apixaban  5 mg Oral BID   Chlorhexidine Gluconate Cloth  6 each Topical Daily   diphenhydrAMINE  25 mg Intravenous QHS   feeding supplement  237 mL Oral TID WC   fentaNYL  1 patch Transdermal Q72H   furosemide  80 mg Intravenous BID   insulin aspart  0-15 Units Subcutaneous Q4H   pantoprazole (PROTONIX) IV  40 mg Intravenous QHS   sodium chloride flush  10-40 mL Intracatheter Q12H   thiamine injection  100 mg Intravenous Daily    Continuous Infusions:  sodium chloride Stopped (01/04/22 1748)   TPN ADULT (ION) 75 mL/hr at 01/10/22 1735   TPN ADULT (ION)      PRN Meds: sodium chloride, bisacodyl, haloperidol lactate, heparin lock flush, HYDROmorphone (DILAUDID) injection, HYDROmorphone HCl, lip balm, ondansetron **OR** ondansetron (ZOFRAN) IV, mouth rinse, phenol, sodium chloride flush  Physical Exam         Awake alert No distress Regular work of breathing S 1 S 2  No edema Has port  Vital Signs: BP 111/61 (BP Location: Left Arm)   Pulse 68   Temp 97.7 F (36.5 C) (Oral)   Resp 18   Ht '5\' 11"'$  (1.803 m)   Wt 86.4 kg   SpO2 98%   BMI 26.57 kg/m  SpO2: SpO2: 98 % O2 Device: O2 Device: Room Air O2 Flow Rate: O2 Flow Rate (L/min): 0 L/min  Intake/output summary:   Intake/Output Summary (Last 24 hours) at 01/11/2022 1051 Last data filed at 01/11/2022 0851 Gross per 24 hour  Intake 2101.42 ml  Output 2010 ml  Net 91.42 ml    LBM: Last BM Date : 01/09/22 Baseline Weight: Weight: 84.7 kg Most recent weight: Weight: 86.4 kg       Palliative Assessment/Data:  Patient Active Problem List   Diagnosis Date Noted   Acute on chronic systolic heart failure ([place]) 01/08/2022   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[place]) 12/31/2021   Port-A-Cath in place 12/13/2021   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/29/2021   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 08/19/2019   Symptomatic bradycardia 08/19/2019   Bradycardia 08/18/2019   COVID-19 virus infection 08/17/2019   Atrial fibrillation ([place]) 08/17/2019   Hyperlipidemia 08/17/2019   Stage 3a chronic kidney disease (CKD) ([place]) 08/17/2019   Acute ischemic stroke (HCC) mult R MCA and PCA embolic infarcts d/t AF not on AC s/p tPA 08/15/2019   CARCINOMA, PROSTATE 09/15/2007   Essential hypertension 09/15/2007   Coronary artery disease 09/15/2007   HEMORRHOIDS, INTERNAL 09/15/2007   RADIATION PROCTITIS 09/15/2007    Palliative Care Assessment & Plan   Patient Profile:    Assessment: 88 year old gentleman admitted with pneumoperitoneum in the setting of known gastric cancer, status post ex lap drainage of intra-abdominal abscess, palliative care following for assistance with pain management.  Discussion/recommendations/Plan: DNR DNI Continue current mode of care  SNF rehab with palliative on discharge Continue current pain management regimen    Goals of Care and Additional Recommendations: Limitations on Scope of Treatment: Full Scope Treatment  Code Status: now DNR DNI.     Code Status Orders  (From admission, onward)           Start     Ordered   12/31/21 2012  Full code  Continuous        12/31/21 2011           Code Status History     Date Active Date Inactive Code  Status Order ID Comments User Context   12/31/2021 1440 12/31/2021 2012 DNR [id number]  Garald Balding, PA-C ED   08/19/2019 0131 08/21/2019 1831 Full Code [id number]  Vianne Bulls, MD ED   08/15/2019 2100 08/17/2019 2210 Full Code [id number]  Amie Portland, MD ED      Advance Directive Documentation    [place] Most Recent Value  Type of Advance Directive Living will  Pre-existing out of facility DNR order (yellow form or pink MOST form) --  "MOST" Form in Place? --       Prognosis:  Guarded   Discharge Planning: SNF rehab with palliative.   Care plan was discussed with patient and daughter jean.  TOC will be notified as per daughter's request.   Thank you for allowing the Palliative Medicine Team to assist in the care of this patient.    MOD MDM.     Greater than 50%  of this time was spent counseling and coordinating care related to the above assessment and plan.  Loistine Chance, MD  Please contact Palliative Medicine Team phone at [phone] for questions and concerns.

## 2022-01-11 NOTE — Progress Notes (Signed)
Occupational Therapy Treatment Patient Details Name: Dillon Smith MRN: [id number] DOB: 11/29/1929 Today's Date: 01/11/2022   History of present illness 88 yo male admitted with gastric perforation. S/P ex lap, drainage of abscess. Hx of gastric ca, ckd, bradycardia, covid, prostate ca, cva, afib.   OT comments  Patient total assist to transfer to side of bed. At edge of bed he was unable to maintain his balance without physical assistance and needed assistance to keep his head and trunk up part of the time. Use of stedy and elevated bed height to stand and transfer to recliner. Patient unable to keep his upper body erect on stedy or use his arms effectively. Once in chair he was able to wash his face and feed himself several bites. He was very fatigued from the transfer. Patient does not currently exhibit the ability to tolerate 3 hours of therapy a day therefore current recommendation is SNF. However, will update POC if patient progresses well.   Recommendations for follow up therapy are one component of a multi-disciplinary discharge planning process, led by the attending physician.  Recommendations may be updated based on patient status, additional functional criteria and insurance authorization.    Follow Up Recommendations  Skilled nursing-short term rehab (<3 hours/day)    Assistance Recommended at Discharge Frequent or constant Supervision/Assistance  Patient can return home with the following  Two people to help with walking and/or transfers;Two people to help with bathing/dressing/bathroom;Assistance with feeding;A lot of help with bathing/dressing/bathroom;Direct supervision/assist for financial management;Assistance with cooking/housework;Assist for transportation;Help with stairs or ramp for entrance;Direct supervision/assist for medications management   Equipment Recommendations  Other (comment) (TBD)    Recommendations for Other Services      Precautions / Restrictions  Precautions Precautions: Fall Precaution Comments: multiple lines, (2) jp drains, abd wound; incontinent Restrictions Weight Bearing Restrictions: No       Mobility Bed Mobility Overal bed mobility: Needs Assistance Bed Mobility: Rolling, Sidelying to Sit Rolling: Max assist, +2 for physical assistance Sidelying to sit: +2 for physical assistance, Total assist Supine to sit: Total assist, +2 for physical assistance, +2 for safety/equipment Sit to supine: Total assist, +2 for physical assistance, +2 for safety/equipment   General bed mobility comments: . pt requires Total A x 2 (Pt 15%). Assist for trunk and bil LEs. Multimodal cueing for initiation, technique. Utilized bedpad for scooting, positioning at EOB. Max A for static sitting balance with progressing to Min A with time.    Transfers Overall transfer level: Needs assistance Equipment used: Ambulation equipment used Transfers: Sit to/from Stand, Bed to chair/wheelchair/BSC Sit to Stand: +2 physical assistance, +2 safety/equipment, From elevated surface, Max assist           General transfer comment: used STEDY lift equipment, +2 max A to stand from elevated bed Transfer via Lift Equipment: Stedy   Balance Overall balance assessment: Needs assistance Sitting-balance support: Bilateral upper extremity supported, Feet supported Sitting balance-Leahy Scale: Zero Sitting balance - Comments: initially Max A for sitting balance progressing to Min/mod A  with increased time. With cues for posture and correction   Standing balance support: Bilateral upper extremity supported, During functional activity, Reliant on assistive device for balance Standing balance-Leahy Scale: Zero                             ADL either performed or assessed with clinical judgement   ADL Overall ADL's : Needs assistance/impaired Eating/Feeding: Set up;Supervision/ safety;Sitting Eating/Feeding  Details (indicate cue type and reason):  able to feed himself a couple scoops of soup and one spoonful of jello. Fatigued after transfer to recliner. Grooming: Dance movement psychotherapist;Set up;Sitting Grooming Details (indicate cue type and reason): positioned in chair. Patient able to wash his face with his right hand.                             Functional mobility during ADLs: Total assistance;+2 for physical assistance General ADL Comments: Therapist encouraged patient to feed himself and was his face once in chair.    Extremity/Trunk Assessment Upper Extremity Assessment Upper Extremity Assessment: Generalized weakness   Lower Extremity Assessment Lower Extremity Assessment: Defer to PT evaluation   Cervical / Trunk Assessment Cervical / Trunk Assessment: Normal    Vision   Vision Assessment?: No apparent visual deficits   Perception     Praxis      Cognition Arousal/Alertness: Awake/alert Behavior During Therapy: WFL for tasks assessed/performed Overall Cognitive Status: Impaired/Different from baseline Area of Impairment: Memory, Problem solving, Following commands, Attention, Safety/judgement, Awareness                 Orientation Level: Disoriented to, Place, Time Current Attention Level: Selective Memory: Decreased short-term memory Following Commands: Follows one step commands with increased time, Follows one step commands inconsistently Safety/Judgement: Decreased awareness of safety, Decreased awareness of deficits Awareness: Intellectual Problem Solving: Slow processing, Decreased initiation, Requires verbal cues, Requires tactile cues, Difficulty sequencing General Comments: pt pleasant, very quiet (daughter reports this is not baseline), multimodal cues needed to complete tasks. very Home as well        Exercises      Shoulder Instructions       General Comments      Pertinent Vitals/ Pain       Pain Assessment Pain Assessment: Faces Faces Pain Scale: Hurts a little  bit Breathing: normal Negative Vocalization: none Facial Expression: sad, frightened, frown Body Language: relaxed Consolability: no need to console PAINAD Score: 1 Pain Location: abdomen with mobility (pt stated he's not in pain but did grimace with movement), somewhat restless in chair Pain Descriptors / Indicators: Grimacing, Guarding Pain Intervention(s): Monitored during session  Home Living                                          Prior Functioning/Environment              Frequency  Min 2X/week        Progress Toward Goals  OT Goals(current goals can now be found in the care plan section)  Progress towards OT goals: Progressing toward goals  Acute Rehab OT Goals Patient Stated Goal: get better OT Goal Formulation: With family Time For Goal Achievement: 01/23/22 Potential to Achieve Goals: [place] Discharge plan needs to be updated    Co-evaluation    PT/OT/SLP Co-Evaluation/Treatment: Yes   PT goals addressed during session: Mobility/safety with mobility OT goals addressed during session: ADL's and self-care (activity tolerance)      AM-PAC OT "6 Clicks" Daily Activity     Outcome Measure   Help from another person eating meals?: A Little Help from another person taking care of personal grooming?: A Little Help from another person toileting, which includes using toliet, bedpan, or urinal?: Total Help from another person bathing (including washing, rinsing,  drying)?: A Lot Help from another person to put on and taking off regular upper body clothing?: A Lot Help from another person to put on and taking off regular lower body clothing?: Total 6 Click Score: 12    End of Session Equipment Utilized During Treatment: Other (comment) (stedy)  OT Visit Diagnosis: Unsteadiness on feet (R26.81);Other abnormalities of gait and mobility (R26.89)   Activity Tolerance Patient tolerated treatment well   Patient Left in chair;with call  bell/phone within reach;with chair alarm set;with family/visitor present   Nurse Communication Mobility status        Time: 1610-9604 OT Time Calculation (min): 23 min  Charges: OT General Charges $OT Visit: 1 Visit OT Treatments $Self Care/Home Management : 8-22 mins  Derl Barrow, OTR/L [place]  Office [phone] Pager: [place] 01/11/2022, 4:15 PM

## 2022-01-11 NOTE — TOC Progression Note (Signed)
Transition of Care [facility]) - Progression Note    Patient Details  Name: BRACK SHADDOCK MRN: [id number] Date of Birth: 11/29/1929  [facility] CM/SW Contact  Maeby Vankleeck, Juliann Pulse, RN Phone Number: 01/11/2022, 4:25 PM  Clinical Narrative:  NGT in place. Will await more medically stable prior faxing out for SNF-patient/dtr in agreement for SNF.      Expected Discharge Plan: [place] Barriers to Discharge: Continued Medical Work up  Expected Discharge Plan and Services Expected Discharge Plan: [place]   Discharge Planning Services: CM Consult   Living arrangements for the past 2 months: Single Family Home                                       Social Determinants of Health (SDOH) Interventions    Readmission Risk Interventions     No data to display

## 2022-01-11 NOTE — Progress Notes (Signed)
Patient ID: Dillon Smith, male   DOB: 11/29/1929, 88 y.o.   MRN: [id number] [facility] Surgery Progress Note  11 Days Post-Op  Subjective: CC-  Alert. Denies pain. Ate better this AM - per family member he ate all of his grits, some milk, most of an ensure.   Objective: Vital signs in last 24 hours: Temp:  [97.6 F (36.4 C)-97.9 F (36.6 C)] 97.7 F (36.5 C) (07/14 0615) Pulse Rate:  [68-89] 68 (07/14 0615) Resp:  [18-23] 18 (07/14 0400) BP: (110-125)/(61-66) 111/61 (07/14 0615) SpO2:  [98 %-100 %] 98 % (07/14 0615) Weight:  [86.4 kg] 86.4 kg (07/14 0500) Last BM Date : 01/11/22  Intake/Output from previous day: 07/13 0701 - 07/14 0700 In: 1962.7 [P.O.:120; I.V.:1842.7] Out: 2010 [Urine:2000; Drains:10] Intake/Output this shift: Total I/O In: 138.7 [I.V.:138.7] Out: 750 [Urine:750]  PE: Gen: Alert, NAD Pulm:  Normal effort on room air Abd: Soft, appropriately tender, drains SS, midline wound pink and moist fascia in tact Skin: warm and dry, no rashes; LUE PICC, port R chest wall Psych: A&Ox3   Lab Results:  Recent Labs    01/10/22 0336 01/11/22 0304  WBC 4.6 5.4  HGB 8.7* 9.1*  HCT 26.2* 27.7*  PLT 190 233   BMET Recent Labs    01/10/22 0336 01/11/22 0304  NA 142 142  K 4.1 4.1  CL 111 109  CO2 26 27  GLUCOSE 123* 137*  BUN 39* 42*  CREATININE 1.45* 1.45*  CALCIUM 8.3* 8.5*   PT/INR No results for input(s): "LABPROT", "INR" in the last 72 hours. CMP     Component Value Date/Time   NA 142 01/11/2022 0304   NA 143 02/06/2021 0845   K 4.1 01/11/2022 0304   CL 109 01/11/2022 0304   CO2 27 01/11/2022 0304   GLUCOSE 137 (H) 01/11/2022 0304   BUN 42 (H) 01/11/2022 0304   BUN 23 02/06/2021 0845   CREATININE 1.45 (H) 01/11/2022 0304   CREATININE 1.57 (H) 12/13/2021 0827   CALCIUM 8.5 (L) 01/11/2022 0304   PROT 6.9 01/11/2022 0304   PROT 6.4 02/06/2021 0845   ALBUMIN 1.8 (L) 01/11/2022 0304   ALBUMIN 3.9 02/06/2021 0845   AST 39  01/11/2022 0304   AST 18 12/13/2021 0827   ALT 42 01/11/2022 0304   ALT 17 12/13/2021 0827   ALKPHOS 72 01/11/2022 0304   BILITOT 0.5 01/11/2022 0304   BILITOT 0.5 12/13/2021 0827   GFRNONAA 46 (L) 01/11/2022 0304   GFRNONAA 42 (L) 12/13/2021 0827   GFRAA 43 (L) 07/06/2020 1156   Lipase     Component Value Date/Time   LIPASE 35 12/31/2021 0941       Studies/Results: No results found.  Anti-infectives: Anti-infectives (From admission, onward)    Start     Dose/Rate Route Frequency Ordered Stop   01/04/22 1200  piperacillin-tazobactam (ZOSYN) IVPB 3.375 g  Status:  Discontinued        3.375 g 12.5 mL/hr over 240 Minutes Intravenous Every 8 hours 01/04/22 0729 01/08/22 1116   01/02/22 1400  piperacillin-tazobactam (ZOSYN) IVPB 2.25 g  Status:  Discontinued        2.25 g 100 mL/hr over 30 Minutes Intravenous Every 8 hours 01/02/22 0718 01/04/22 0728   01/01/22 2000  vancomycin (VANCOCIN) IVPB 1000 mg/200 mL premix  Status:  Discontinued        1,000 mg 200 mL/hr over 60 Minutes Intravenous Every 24 hours 12/31/21 1820 01/01/22 0824   12/31/21 2000  piperacillin-tazobactam (ZOSYN) IVPB 3.375 g  Status:  Discontinued        3.375 g 12.5 mL/hr over 240 Minutes Intravenous Every 8 hours 12/31/21 1821 01/02/22 0718   12/31/21 1130  vancomycin (VANCOREADY) IVPB 1750 mg/350 mL        1,750 mg 175 mL/hr over 120 Minutes Intravenous  Once 12/31/21 1124 12/31/21 1411   12/31/21 1130  ceFEPIme (MAXIPIME) 2 g in sodium chloride 0.9 % 100 mL IVPB        2 g 200 mL/hr over 30 Minutes Intravenous  Once 12/31/21 1124 12/31/21 1226        Assessment/Plan Pneumoperitoneum in the setting of known gastric cancer on chemotherapy POD #11 status post ex lap drainage of intra-abdominal abscess 7/3 Dr. Marlou Starks Pulled out his NG 7/9, unable to get UGI due to inability to swallow all of the contrast.  Tolerating FLD without evidence of leak at this time with scant serous JP drainage. D/C blake  drains  SLP following. Advance diet as tolerated per their recs. Not eating great but no evidence of intestinal leak and having bowel function -- stop TPN and place cortrak in stomach for supplemental enteral feeds until PO intake improves. Patient volume status significantly net positive this admission and cardiology is diuresing him. appreciate their care. BMP ordered for AM.  Continue twice daily wet-to-dry dressing changes to midline wound  -Appreciate palliative care following and helping with pain control.  [facility] consulting for assistance with chronic medical conditions - AKI on CKD III, HTN, HLD -Cardiology consult for assistance with afib and heart failure - switched to oral amiodarone, eliquis   FEN: FLD, wean TPN to stop tonight, place small bore feeding tube and start tube feeds; ok to advance to solid diet if cleared by speech therapy ID: Zosyn 7/3-7/11 VTE: SCD's, eliquis Foley: external cath Dispo: progressive care, PT/OT, adv diet CIR consult, CIR vs. SNF rehab with palliative care on discharge Will need follow up with Dr. [name]    LOS: 11 days    Jill Alexanders, [facility] Surgery 01/11/2022, 11:35 AM Please see Amion for pager number during day hours 7:00am-4:30pm

## 2022-01-12 ENCOUNTER — Inpatient Hospital Stay: Payer: Medicare Other

## 2022-01-12 DIAGNOSIS — I5023 Acute on chronic systolic (congestive) heart failure: Secondary | ICD-10-CM | POA: Diagnosis not present

## 2022-01-12 DIAGNOSIS — K255 Chronic or unspecified gastric ulcer with perforation: Secondary | ICD-10-CM | POA: Diagnosis not present

## 2022-01-12 DIAGNOSIS — I2510 Atherosclerotic heart disease of native coronary artery without angina pectoris: Secondary | ICD-10-CM | POA: Diagnosis not present

## 2022-01-12 DIAGNOSIS — I4819 Other persistent atrial fibrillation: Secondary | ICD-10-CM | POA: Diagnosis not present

## 2022-01-12 LAB — CBC
HCT: 26.4 % — ABNORMAL LOW (ref 39.0–52.0)
Hemoglobin: 8.8 g/dL — ABNORMAL LOW (ref 13.0–17.0)
MCH: 30.1 pg (ref 26.0–34.0)
MCHC: 33.3 g/dL (ref 30.0–36.0)
MCV: 90.4 fL (ref 80.0–100.0)
Platelets: 260 10*3/uL (ref 150–400)
RBC: 2.92 MIL/uL — ABNORMAL LOW (ref 4.22–5.81)
RDW: 15.1 % (ref 11.5–15.5)
WBC: 5.4 10*3/uL (ref 4.0–10.5)
nRBC: 0 % (ref 0.0–0.2)

## 2022-01-12 LAB — GLUCOSE, CAPILLARY
Glucose-Capillary: 127 mg/dL — ABNORMAL HIGH (ref 70–99)
Glucose-Capillary: 123 mg/dL — ABNORMAL HIGH (ref 70–99)
Glucose-Capillary: 80 mg/dL (ref 70–99)
Glucose-Capillary: 106 mg/dL — ABNORMAL HIGH (ref 70–99)

## 2022-01-12 LAB — BASIC METABOLIC PANEL
Anion gap: 8 (ref 5–15)
BUN: 47 mg/dL — ABNORMAL HIGH (ref 8–23)
CO2: 28 mmol/L (ref 22–32)
Calcium: 8.2 mg/dL — ABNORMAL LOW (ref 8.9–10.3)
Chloride: 105 mmol/L (ref 98–111)
Creatinine, Ser: 1.72 mg/dL — ABNORMAL HIGH (ref 0.61–1.24)
GFR, Estimated: 37 mL/min — ABNORMAL LOW (ref >60)
Glucose, Bld: 122 mg/dL — ABNORMAL HIGH (ref 70–99)
Potassium: 4 mmol/L (ref 3.5–5.1)
Sodium: 141 mmol/L (ref 135–145)

## 2022-01-12 MED ORDER — METOPROLOL SUCCINATE ER 25 MG PO TB24
12.5000 mg | ORAL_TABLET | Freq: Every day | ORAL | Status: DC
  Administered 2022-01-12 – 2022-01-22 (×11): 12.5 mg via ORAL
  Filled 2022-01-12 (×11): qty 1

## 2022-01-12 MED ORDER — FENTANYL 25 MCG/HR TD PT72
1.0000 | MEDICATED_PATCH | TRANSDERMAL | Status: DC
  Administered 2022-01-12 – 2022-01-21 (×4): 1 via TRANSDERMAL
  Filled 2022-01-12 (×4): qty 1

## 2022-01-12 MED ORDER — THIAMINE HCL 100 MG PO TABS
100.0000 mg | ORAL_TABLET | Freq: Every day | ORAL | Status: DC
  Administered 2022-01-13 – 2022-01-22 (×10): 100 mg via ORAL
  Filled 2022-01-12 (×10): qty 1

## 2022-01-12 NOTE — Progress Notes (Signed)
Progress Note  Patient Name: Dillon Smith Date of Encounter: 01/12/2022  [facility] Cardiologist: Lauree Chandler, MD   Subjective   Resting comfortably, awakens to voice, cannot provide history  Inpatient Medications    Scheduled Meds:  amiodarone  200 mg Oral Daily   apixaban  5 mg Oral BID   Chlorhexidine Gluconate Cloth  6 each Topical Daily   diphenhydrAMINE  25 mg Intravenous QHS   feeding supplement  237 mL Oral TID WC   feeding supplement (PROSource TF)  45 mL Per Tube BID   fentaNYL  1 patch Transdermal Q72H   free water  100 mL Per Tube TID   insulin aspart  0-9 Units Subcutaneous TID WC   metoprolol succinate  12.5 mg Oral Daily   pantoprazole (PROTONIX) IV  40 mg Intravenous QHS   sodium chloride flush  10-40 mL Intracatheter Q12H   thiamine injection  100 mg Intravenous Daily   Continuous Infusions:  sodium chloride Stopped (01/04/22 1748)   feeding supplement (OSMOLITE 1.5 CAL) Stopped (01/12/22 1055)   PRN Meds: sodium chloride, bisacodyl, haloperidol lactate, heparin lock flush, HYDROmorphone (DILAUDID) injection, HYDROmorphone HCl, lip balm, ondansetron **OR** ondansetron (ZOFRAN) IV, mouth rinse, phenol, sodium chloride flush   Vital Signs    Vitals:   01/11/22 1250 01/11/22 2034 01/12/22 0413 01/12/22 0740  BP: (!) 149/83 101/69 118/73 134/84  Pulse: 88 90 63 86  Resp: 16 16    Temp: 98.4 F (36.9 C) 98 F (36.7 C) 97.7 F (36.5 C)   TempSrc: Oral Oral Oral   SpO2: 100% 98% 99%   Weight:   80.6 kg   Height:        Intake/Output Summary (Last 24 hours) at 01/12/2022 1139 Last data filed at 01/12/2022 1055 Gross per 24 hour  Intake 1451.91 ml  Output 2900 ml  Net -1448.09 ml      01/12/2022    4:13 AM 01/11/2022    5:00 AM 01/10/2022    8:24 AM  Last 3 Weights  Weight (lbs) 177 lb 11.1 oz 190 lb 7.6 oz 186 lb 11.7 oz  Weight (kg) 80.6 kg 86.4 kg 84.7 kg      Telemetry    Rate controlled atrial fibrillation - Personally  Reviewed  ECG    No new since 7/7 - Personally Reviewed  Physical Exam   GEN: No acute distress.  Mitts and dobhoff feeding tube in place Neck: No JVD Cardiac: irregularly irregular, no murmurs, rubs, or gallops.  Respiratory: Clear to auscultation bilaterally. GI: Soft, nontender, non-distended  MS: No edema; No deformity. Neuro:  Nonfocal  Psych: Normal affect   Labs    High Sensitivity Troponin:   Recent Labs  Lab 12/31/21 0941 12/31/21 1143  TROPONINIHS 11 11     Chemistry Recent Labs  Lab 01/07/22 0430 01/08/22 0324 01/09/22 0934 01/10/22 0336 01/11/22 0304 01/12/22 0344  NA 148*   < >  --  142 142 141  K 4.2   < >  --  4.1 4.1 4.0  CL 117*   < >  --  111 109 105  CO2 26   < >  --  '26 27 28  '$ GLUCOSE 150*   < >  --  123* 137* 122*  BUN 38*   < >  --  39* 42* 47*  CREATININE 1.50*   < >  --  1.45* 1.45* 1.72*  CALCIUM 8.7*   < >  --  8.3* 8.5*  8.2*  MG 2.1   < > 1.7 1.8 2.0  --   PROT 5.6*  --   --  6.5 6.9  --   ALBUMIN 1.8*  --  1.7* 1.6* 1.8*  --   AST 30  --   --  38 39  --   ALT 36  --   --  40 42  --   ALKPHOS 84  --   --  71 72  --   BILITOT 0.7  --   --  0.8 0.5  --   GFRNONAA 44*   < >  --  46* 46* 37*  ANIONGAP 5   < >  --  '5 6 8   '$ < > = values in this interval not displayed.    Lipids  Recent Labs  Lab 01/10/22 0336  TRIG 69    Hematology Recent Labs  Lab 01/10/22 0336 01/11/22 0304 01/12/22 0344  WBC 4.6 5.4 5.4  RBC 2.87* 3.01* 2.92*  HGB 8.7* 9.1* 8.8*  HCT 26.2* 27.7* 26.4*  MCV 91.3 92.0 90.4  MCH 30.3 30.2 30.1  MCHC 33.2 32.9 33.3  RDW 15.1 15.2 15.1  PLT 190 233 260   Thyroid No results for input(s): "TSH", "FREET4" in the last 168 hours.  BNP Recent Labs  Lab 01/09/22 0506  BNP 482.5*    DDimer No results for input(s): "DDIMER" in the last 168 hours.   Radiology    DG Abd Portable 1V  Result Date: 01/11/2022 CLINICAL DATA:  Feeding tube placement EXAM: PORTABLE ABDOMEN - 1 VIEW COMPARISON:  01/07/2022  FINDINGS: Tip of enteric tube is seen in the antrum of the stomach. There are ring-like densities in the left epigastrium, possibly a stent. Bowel gas pattern in the upper abdomen is unremarkable. Tip of central venous catheter is seen in superior vena cava close to the right atrium. IMPRESSION: Tip of feeding tube is seen in the antrum of the stomach. Electronically Signed   By: Elmer Picker M.D.   On: 01/11/2022 16:18    Cardiac Studies   Echo 01/03/2022  1. Left ventricular ejection fraction, by estimation, is 25 to 30%. The  left ventricle has severely decreased function. The left ventricle  demonstrates global hypokinesis. Left ventricular diastolic parameters are  consistent with Grade II diastolic  dysfunction (pseudonormalization). The average left ventricular global  longitudinal strain is -8.0 %. The global longitudinal strain is abnormal.   2. Right ventricular systolic function is mildly reduced. The right  ventricular size is moderately enlarged. There is mildly elevated  pulmonary artery systolic pressure. The estimated right ventricular  systolic pressure is 92.1 mmHg.   3. Left atrial size was severely dilated.   4. Right atrial size was moderately dilated.   5. The mitral valve is normal in structure. Moderate mitral valve  regurgitation. No evidence of mitral stenosis.   6. Tricuspid valve regurgitation is moderate.   7. The aortic valve is tricuspid. There is mild calcification of the  aortic valve. There is mild thickening of the aortic valve. Aortic valve  regurgitation is severe. Aortic valve sclerosis is present, with no  evidence of aortic valve stenosis.   8. The inferior vena cava is normal in size with greater than 50%  respiratory variability, suggesting right atrial pressure of 3 mmHg.   Comparison(s): Prior images reviewed side by side.   Patient Profile     88 y.o. male with PMH of gastric CA diagnosed 08/2021, longstanding persistent A-fib,  CVA in  2021, hypertension, prostate cancer, hyperlipidemia, CKD stage III, and CAD status post CABG 2007 admitted with pneumoperitoneum and intra-abdominal abscess s/p ex lap drainage, cardiology service consulted for A-fib.  Echocardiogram obtained on 01/03/2022 showed EF 25 to 30% which is down from the previous 45-50% back in 2022.  Assessment & Plan    Longstanding persistent atrial fibrillation -CHA2DS2/VAS Stroke Risk Points=7  -continue apixaban 5 mg BID. Cr bumped today but has been less than 1.5. Meets age requirement for dose reduction but not weight requirement; follow Cr for appropriate dosing -continue amiodarone 200 mg daily   Acute systolic and diastolic heart failure -EF dropped to 25% this admission -admission weight 84.7 kg, current weight 80.6 kg, though charted 11L positive -Cr increased from 1.45 to 1.72 today. Stop IV diuretic -GDMT limited by low blood pressure, renal function. Blood pressure improving today but Cr also up.  -will start low dose metoprolol succinate  CAD s/p CABG -on atorvastatin as an outpatient -no aspirin as he is on apixaban  Per primary team/surgery/oncology: Pneumoperitoneum: s/p ex lap drainage   Recently diagnosed gastric cancer: neoadjuvant chemo on hold at this time, followed by hem/onc  For questions or updates, please contact [facility] Please consult [email or web address] for contact info under        Signed, Buford Dresser, MD  01/12/2022, 11:39 AM

## 2022-01-12 NOTE — Progress Notes (Signed)
Inpatient Rehab Admissions Coordinator:  Note changes in therapy recommendations to SNF. Pt currently not able to tolerate the intensity of CIR. AC will sign off.   Gayland Curry, [place], [place] Admissions Coordinator [phone]

## 2022-01-12 NOTE — Plan of Care (Signed)
  Problem: Education: Goal: Knowledge of General Education information will improve Description: Including pain rating scale, medication(s)/side effects and non-pharmacologic comfort measures Outcome: Not Progressing   Problem: Health Behavior/Discharge Planning: Goal: Ability to manage health-related needs will improve Outcome: Not Progressing   Problem: Clinical Measurements: Goal: Ability to maintain clinical measurements within normal limits will improve Outcome: Progressing Goal: Will remain free from infection Outcome: Progressing Goal: Diagnostic test results will improve Outcome: Progressing   Problem: Elimination: Goal: Will not experience complications related to bowel motility Outcome: Completed/Met   Problem: Pain Managment: Goal: General experience of comfort will improve Outcome: Completed/Met   Problem: Safety: Goal: Ability to remain free from injury will improve Outcome: Not Progressing   Problem: Skin Integrity: Goal: Risk for impaired skin integrity will decrease Outcome: Progressing

## 2022-01-12 NOTE — Progress Notes (Signed)
Pt pulled out NG tube around 2000 and feeding was leaky. RN paused feeding pump. Notified to CN and attending On-call. CN and RN removed the tubing and only the tip of tubing was in the R nare. Notified to attending On-call again.

## 2022-01-12 NOTE — Progress Notes (Signed)
[facility] Consult PROGRESS NOTE    Progress Note  Dillon Smith  ACZ:[id number] DOB: 1929-11-21 DOA: 12/31/2021 PCP: [name]     Brief Narrative:   Dillon Smith is an 88 y.o. male past medical history of chronic kidney disease stage III, paroxysmal atrial fibrillation on anticoagulation at home, retention history of gastric cancer was admitted by the surgical service as work-up revealed pneumoperitoneum due to gastric perforation was taken to the OR about 8 days prior to the initial consult for exploratory laparotomy and repair, pulmonary and critical care was managing as he has been transferred to the floor General surgery was consulted tried for medical assistance.   Assessment/Plan:   Acute kidney injury on chronic kidney disease stage IIIa: Baseline creatinine around 1.1-1.3, today is 1.3. He is being diuresed by cardiology continue to monitor strict I's and O's. Mild increase in his creatinine likely due to overdiuresis. Previous management per cardiology. Continue current management go ahead and sign off.  Essential hypertension: Receiving Lasix intermittently, blood pressure seems to be well controlled.  Hyperlipidemia: Can resume statins once able to take orals.  Paroxysmal atrial fibrillation: Cardiology has been consulted, the patient is currently on amiodarone and heparin drip.  Pneumoperitoneum in the setting of gastric cancer perforation on chemotherapy: Status post surgical intervention with exploratory laparotomy and intra-abdominal abscesses about 8 days ago. They were unable to replace the NG tube due to inability to swallow. P.o. trials were started on 01/07/2022 evaluated by speech. He has had no signs of enteric leak. Continue diet per surgery.   Continue TPN. Due to fentanyl patch and hold oral hydromorphone for pain control he relates he seems to be working. Patient has an extremely poor prognoses. He would be a good idea to  have end-of-life conversation with family as his prognosis is extremely poor.  Diabetes mellitus type II: Currently on TNA and liquid diet. Continue insulin blood glucose is well controlled.  Goals of care/ethics: There will be a good idea for family to start discussing with palliative care to move towards comfort care due to his poor prognosis.    DVT prophylaxis: lovenox Family Communication:none Status is: Inpatient Remains inpatient appropriate because: Acute perforated viscus    Code Status:     Code Status Orders  (From admission, onward)           Start     Ordered   12/31/21 2012  Full code  Continuous        12/31/21 2011           Code Status History     Date Active Date Inactive Code Status Order ID Comments User Context   12/31/2021 1440 12/31/2021 2012 DNR [id number]  Garald Balding, PA-C ED   08/19/2019 0131 08/21/2019 1831 Full Code [id number]  Vianne Bulls, MD ED   08/15/2019 2100 08/17/2019 2210 Full Code [id number]  Amie Portland, MD ED      Advance Directive Documentation    Flowsheet Row Most Recent Value  Type of Advance Directive Living will  Pre-existing out of facility DNR order (yellow form or pink MOST form) --  "MOST" Form in Place? --         IV Access:   Peripheral IV   Procedures and diagnostic studies:   DG Abd Portable 1V  Result Date: 01/11/2022 CLINICAL DATA:  Feeding tube placement EXAM: PORTABLE ABDOMEN - 1 VIEW COMPARISON:  01/07/2022 FINDINGS: Tip of enteric tube is seen in the antrum of  the stomach. There are ring-like densities in the left epigastrium, possibly a stent. Bowel gas pattern in the upper abdomen is unremarkable. Tip of central venous catheter is seen in superior vena cava close to the right atrium. IMPRESSION: Tip of feeding tube is seen in the antrum of the stomach. Electronically Signed   By: Elmer Picker M.D.   On: 01/11/2022 16:18     Medical Consultants:   None.   Subjective:     Dillon Smith pain is controlled.  Objective:    Vitals:   01/11/22 1250 01/11/22 2034 01/12/22 0413 01/12/22 0740  BP: (!) 149/83 101/69 118/73 134/84  Pulse: 88 90 63 86  Resp: 16 16    Temp: 98.4 F (36.9 C) 98 F (36.7 C) 97.7 F (36.5 C)   TempSrc: Oral Oral Oral   SpO2: 100% 98% 99%   Weight:   80.6 kg   Height:       SpO2: 99 % O2 Flow Rate (L/min): 0 L/min FiO2 (%): (!) 0 %   Intake/Output Summary (Last 24 hours) at 01/12/2022 1044 Last data filed at 01/12/2022 0800 Gross per 24 hour  Intake 1154.83 ml  Output 3050 ml  Net -1895.17 ml    Filed Weights   01/10/22 0824 01/11/22 0500 01/12/22 0413  Weight: 84.7 kg 86.4 kg 80.6 kg    Exam: General exam: In no acute distress. Respiratory system: Good air movement and clear to auscultation. Cardiovascular system: S1 & S2 heard, RRR. No JVD. Gastrointestinal system: Abdomen is nondistended, soft and nontender.  Extremities: No pedal edema. Skin: No rashes, lesions or ulcers Psychiatry: Judgement and insight appear normal. Mood & affect appropriate.   Data Reviewed:    Labs: Basic Metabolic Panel: Recent Labs  Lab 01/07/22 0430 01/08/22 0324 01/09/22 0506 01/09/22 0934 01/10/22 0336 01/11/22 0304 01/12/22 0344  NA 148* 147* 142  --  142 142 141  K 4.2 4.3 4.0  --  4.1 4.1 4.0  CL 117* 114* 112*  --  111 109 105  CO2 '26 26 25  '$ --  '26 27 28  '$ GLUCOSE 150* 156* 167*  --  123* 137* 122*  BUN 38* 38* 36*  --  39* 42* 47*  CREATININE 1.50* 1.60* 1.36*  --  1.45* 1.45* 1.72*  CALCIUM 8.7* 8.4* 8.0*  --  8.3* 8.5* 8.2*  MG 2.1 2.0  --  1.7 1.8 2.0  --   PHOS 2.8 3.0  --  3.1 3.5 4.0  --     GFR Estimated Creatinine Clearance: 30.4 mL/min (A) (by C-G formula based on SCr of 1.72 mg/dL (H)). Liver Function Tests: Recent Labs  Lab 01/06/22 0510 01/07/22 0430 01/09/22 0934 01/10/22 0336 01/11/22 0304  AST 34 30  --  38 39  ALT 41 36  --  40 42  ALKPHOS 89 84  --  71 72  BILITOT 0.7 0.7   --  0.8 0.5  PROT 5.6* 5.6*  --  6.5 6.9  ALBUMIN 1.9* 1.8* 1.7* 1.6* 1.8*    No results for input(s): "LIPASE", "AMYLASE" in the last 168 hours. No results for input(s): "AMMONIA" in the last 168 hours. Coagulation profile No results for input(s): "INR", "PROTIME" in the last 168 hours. COVID-19 Labs  No results for input(s): "DDIMER", "FERRITIN", "LDH", "CRP" in the last 72 hours.  Lab Results  Component Value Date   SARSCOV2NAA POSITIVE (A) 08/15/2019    CBC: Recent Labs  Lab 01/07/22 0430 01/09/22 0506 01/10/22  9532 01/11/22 0304 01/12/22 0344  WBC 5.7 5.1 4.6 5.4 5.4  HGB 9.6* 9.0* 8.7* 9.1* 8.8*  HCT 28.4* 26.9* 26.2* 27.7* 26.4*  MCV 91.3 92.4 91.3 92.0 90.4  PLT 103* 159 190 233 260    Cardiac Enzymes: No results for input(s): "CKTOTAL", "CKMB", "CKMBINDEX", "TROPONINI" in the last 168 hours. BNP (last 3 results) No results for input(s): "PROBNP" in the last 8760 hours. CBG: Recent Labs  Lab 01/11/22 1140 01/11/22 1421 01/11/22 1704 01/11/22 2031 01/12/22 0715  GLUCAP 121* 103* 93 121* 127*    D-Dimer: No results for input(s): "DDIMER" in the last 72 hours. Hgb A1c: No results for input(s): "HGBA1C" in the last 72 hours. Lipid Profile: Recent Labs    01/10/22 0336  TRIG 69    Thyroid function studies: No results for input(s): "TSH", "T4TOTAL", "T3FREE", "THYROIDAB" in the last 72 hours.  Invalid input(s): "FREET3" Anemia work up: No results for input(s): "VITAMINB12", "FOLATE", "FERRITIN", "TIBC", "IRON", "RETICCTPCT" in the last 72 hours. Sepsis Labs: Recent Labs  Lab 01/09/22 0506 01/10/22 0336 01/11/22 0304 01/12/22 0344  WBC 5.1 4.6 5.4 5.4    Microbiology No results found for this or any previous visit (from the past 240 hour(s)).    Medications:    amiodarone  200 mg Oral Daily   apixaban  5 mg Oral BID   Chlorhexidine Gluconate Cloth  6 each Topical Daily   diphenhydrAMINE  25 mg Intravenous QHS   feeding supplement   237 mL Oral TID WC   feeding supplement (PROSource TF)  45 mL Per Tube BID   fentaNYL  1 patch Transdermal Q72H   free water  100 mL Per Tube TID   insulin aspart  0-9 Units Subcutaneous TID WC   pantoprazole (PROTONIX) IV  40 mg Intravenous QHS   sodium chloride flush  10-40 mL Intracatheter Q12H   thiamine injection  100 mg Intravenous Daily   Continuous Infusions:  sodium chloride Stopped (01/04/22 1748)   feeding supplement (OSMOLITE 1.5 CAL) 1,000 mL (01/11/22 1757)      LOS: 12 days   Charlynne Cousins  [facility]  01/12/2022, 10:44 AM

## 2022-01-12 NOTE — Progress Notes (Signed)
12 Days Post-Op   Subjective/Chief Complaint: No acute changes overnight Sleeping but wakes easily   Objective: Vital signs in last 24 hours: Temp:  [97.7 F (36.5 C)-98.4 F (36.9 C)] 97.7 F (36.5 C) (07/15 0413) Pulse Rate:  [63-90] 86 (07/15 0740) Resp:  [16] 16 (07/14 2034) BP: (101-149)/(69-84) 134/84 (07/15 0740) SpO2:  [98 %-100 %] 99 % (07/15 0413) Weight:  [80.6 kg] 80.6 kg (07/15 0413) Last BM Date : 01/11/22  Intake/Output from previous day: 07/14 0701 - 07/15 0700 In: 1443.5 [P.O.:300; I.V.:386.8; NG/GT:756.8] Out: 2750 [Urine:2750] Intake/Output this shift: Total I/O In: 90 [NG/GT:90] Out: 300 [Urine:300]  Exam: Comfortable Abdomen soft, wound stable, no peritonitis  Lab Results:  Recent Labs    01/11/22 0304 01/12/22 0344  WBC 5.4 5.4  HGB 9.1* 8.8*  HCT 27.7* 26.4*  PLT 233 260   BMET Recent Labs    01/11/22 0304 01/12/22 0344  NA 142 141  K 4.1 4.0  CL 109 105  CO2 27 28  GLUCOSE 137* 122*  BUN 42* 47*  CREATININE 1.45* 1.72*  CALCIUM 8.5* 8.2*   PT/INR No results for input(s): "LABPROT", "INR" in the last 72 hours. ABG No results for input(s): "PHART", "HCO3" in the last 72 hours.  Invalid input(s): "PCO2", "PO2"  Studies/Results: DG Abd Portable 1V  Result Date: 01/11/2022 CLINICAL DATA:  Feeding tube placement EXAM: PORTABLE ABDOMEN - 1 VIEW COMPARISON:  01/07/2022 FINDINGS: Tip of enteric tube is seen in the antrum of the stomach. There are ring-like densities in the left epigastrium, possibly a stent. Bowel gas pattern in the upper abdomen is unremarkable. Tip of central venous catheter is seen in superior vena cava close to the right atrium. IMPRESSION: Tip of feeding tube is seen in the antrum of the stomach. Electronically Signed   By: Elmer Picker M.D.   On: 01/11/2022 16:18    Anti-infectives: Anti-infectives (From admission, onward)    Start     Dose/Rate Route Frequency Ordered Stop   01/04/22 1200   piperacillin-tazobactam (ZOSYN) IVPB 3.375 g  Status:  Discontinued        3.375 g 12.5 mL/hr over 240 Minutes Intravenous Every 8 hours 01/04/22 0729 01/08/22 1116   01/02/22 1400  piperacillin-tazobactam (ZOSYN) IVPB 2.25 g  Status:  Discontinued        2.25 g 100 mL/hr over 30 Minutes Intravenous Every 8 hours 01/02/22 0718 01/04/22 0728   01/01/22 2000  vancomycin (VANCOCIN) IVPB 1000 mg/200 mL premix  Status:  Discontinued        1,000 mg 200 mL/hr over 60 Minutes Intravenous Every 24 hours 12/31/21 1820 01/01/22 0824   12/31/21 2000  piperacillin-tazobactam (ZOSYN) IVPB 3.375 g  Status:  Discontinued        3.375 g 12.5 mL/hr over 240 Minutes Intravenous Every 8 hours 12/31/21 1821 01/02/22 0718   12/31/21 1130  vancomycin (VANCOREADY) IVPB 1750 mg/350 mL        1,750 mg 175 mL/hr over 120 Minutes Intravenous  Once 12/31/21 1124 12/31/21 1411   12/31/21 1130  ceFEPIme (MAXIPIME) 2 g in sodium chloride 0.9 % 100 mL IVPB        2 g 200 mL/hr over 30 Minutes Intravenous  Once 12/31/21 1124 12/31/21 1226       Assessment/Plan: Pneumoperitoneum in the setting of known gastric cancer on chemotherapy POD #12 status post ex lap drainage of intra-abdominal abscess 7/3 Dr. Marlou Starks  Continuing current care, diet, etc. -Appreciate palliative care following  and helping with pain control.  [facility] consulting for assistance with chronic medical conditions - AKI on CKD III, HTN, HLD -Cardiology consult for assistance with afib and heart failure - switched to oral amiodarone, eliquis   Will need placement [name] 01/12/2022

## 2022-01-13 ENCOUNTER — Inpatient Hospital Stay (HOSPITAL_COMMUNITY): Payer: Medicare Other

## 2022-01-13 DIAGNOSIS — I4819 Other persistent atrial fibrillation: Secondary | ICD-10-CM | POA: Diagnosis not present

## 2022-01-13 DIAGNOSIS — I5023 Acute on chronic systolic (congestive) heart failure: Secondary | ICD-10-CM | POA: Diagnosis not present

## 2022-01-13 DIAGNOSIS — I2510 Atherosclerotic heart disease of native coronary artery without angina pectoris: Secondary | ICD-10-CM | POA: Diagnosis not present

## 2022-01-13 DIAGNOSIS — K255 Chronic or unspecified gastric ulcer with perforation: Secondary | ICD-10-CM | POA: Diagnosis not present

## 2022-01-13 LAB — BASIC METABOLIC PANEL
Anion gap: 6 (ref 5–15)
BUN: 49 mg/dL — ABNORMAL HIGH (ref 8–23)
CO2: 29 mmol/L (ref 22–32)
Calcium: 8.1 mg/dL — ABNORMAL LOW (ref 8.9–10.3)
Chloride: 105 mmol/L (ref 98–111)
Creatinine, Ser: 1.7 mg/dL — ABNORMAL HIGH (ref 0.61–1.24)
GFR, Estimated: 38 mL/min — ABNORMAL LOW (ref >60)
Glucose, Bld: 90 mg/dL (ref 70–99)
Potassium: 3.8 mmol/L (ref 3.5–5.1)
Sodium: 140 mmol/L (ref 135–145)

## 2022-01-13 LAB — GLUCOSE, CAPILLARY
Glucose-Capillary: 89 mg/dL (ref 70–99)
Glucose-Capillary: 96 mg/dL (ref 70–99)
Glucose-Capillary: 113 mg/dL — ABNORMAL HIGH (ref 70–99)
Glucose-Capillary: 143 mg/dL — ABNORMAL HIGH (ref 70–99)

## 2022-01-13 MED ORDER — ORAL CARE MOUTH RINSE
15.0000 mL | OROMUCOSAL | Status: DC
  Administered 2022-01-13 – 2022-01-21 (×35): 15 mL via OROMUCOSAL

## 2022-01-13 MED ORDER — ORAL CARE MOUTH RINSE: 15.0000 mL | OROMUCOSAL | Status: DC | PRN

## 2022-01-13 NOTE — Progress Notes (Signed)
13 Days Post-Op   Subjective/Chief Complaint: Stable overnight   Objective: Vital signs in last 24 hours: Temp:  [97.5 F (36.4 C)-98.4 F (36.9 C)] 97.7 F (36.5 C) (07/16 0440) Pulse Rate:  [61-81] 81 (07/16 0440) Resp:  [16-19] 16 (07/16 0440) BP: (110-142)/(60-121) 110/68 (07/16 0440) SpO2:  [98 %] 98 % (07/16 0440) Weight:  [76.5 kg] 76.5 kg (07/16 0440) Last BM Date : 01/11/22  Intake/Output from previous day: 07/15 0701 - 07/16 0700 In: 929.3 [P.O.:80; NG/GT:849.3] Out: 2100 [Urine:2100] Intake/Output this shift: No intake/output data recorded.  Exam: Awake and alert Abdomen soft, tube in place  Lab Results:  Recent Labs    01/11/22 0304 01/12/22 0344  WBC 5.4 5.4  HGB 9.1* 8.8*  HCT 27.7* 26.4*  PLT 233 260   BMET Recent Labs    01/12/22 0344 01/13/22 0504  NA 141 140  K 4.0 3.8  CL 105 105  CO2 28 29  GLUCOSE 122* 90  BUN 47* 49*  CREATININE 1.72* 1.70*  CALCIUM 8.2* 8.1*   PT/INR No results for input(s): "LABPROT", "INR" in the last 72 hours. ABG No results for input(s): "PHART", "HCO3" in the last 72 hours.  Invalid input(s): "PCO2", "PO2"  Studies/Results: DG Abd Portable 1V  Result Date: 01/11/2022 CLINICAL DATA:  Feeding tube placement EXAM: PORTABLE ABDOMEN - 1 VIEW COMPARISON:  01/07/2022 FINDINGS: Tip of enteric tube is seen in the antrum of the stomach. There are ring-like densities in the left epigastrium, possibly a stent. Bowel gas pattern in the upper abdomen is unremarkable. Tip of central venous catheter is seen in superior vena cava close to the right atrium. IMPRESSION: Tip of feeding tube is seen in the antrum of the stomach. Electronically Signed   By: Elmer Picker M.D.   On: 01/11/2022 16:18    Anti-infectives: Anti-infectives (From admission, onward)    Start     Dose/Rate Route Frequency Ordered Stop   01/04/22 1200  piperacillin-tazobactam (ZOSYN) IVPB 3.375 g  Status:  Discontinued        3.375 g 12.5  mL/hr over 240 Minutes Intravenous Every 8 hours 01/04/22 0729 01/08/22 1116   01/02/22 1400  piperacillin-tazobactam (ZOSYN) IVPB 2.25 g  Status:  Discontinued        2.25 g 100 mL/hr over 30 Minutes Intravenous Every 8 hours 01/02/22 0718 01/04/22 0728   01/01/22 2000  vancomycin (VANCOCIN) IVPB 1000 mg/200 mL premix  Status:  Discontinued        1,000 mg 200 mL/hr over 60 Minutes Intravenous Every 24 hours 12/31/21 1820 01/01/22 0824   12/31/21 2000  piperacillin-tazobactam (ZOSYN) IVPB 3.375 g  Status:  Discontinued        3.375 g 12.5 mL/hr over 240 Minutes Intravenous Every 8 hours 12/31/21 1821 01/02/22 0718   12/31/21 1130  vancomycin (VANCOREADY) IVPB 1750 mg/350 mL        1,750 mg 175 mL/hr over 120 Minutes Intravenous  Once 12/31/21 1124 12/31/21 1411   12/31/21 1130  ceFEPIme (MAXIPIME) 2 g in sodium chloride 0.9 % 100 mL IVPB        2 g 200 mL/hr over 30 Minutes Intravenous  Once 12/31/21 1124 12/31/21 1226       Assessment/Plan: Pneumoperitoneum in the setting of known gastric cancer on chemotherapy POD #13 status post ex lap drainage of intra-abdominal abscess 7/3 Dr. Marlou Starks   Continue current care Dispo planning   [name] 01/13/2022

## 2022-01-13 NOTE — Progress Notes (Signed)
Daily Progress Note   Patient Name: Dillon Smith       Date: 01/13/2022 DOB: 07-31-1929  Age: 88 y.o. MRN#: [id number] Attending Physician: Nolon Nations, MD Primary Care Physician: [name] Admit Date: 12/31/2021  Reason for Consultation/Follow-up: Establishing goals of care  Subjective: Continues to be awake and alert, responding appropriately, pulled out NGT, wearing mittens.      Length of Stay: 13  Current Medications: Scheduled Meds:   amiodarone  200 mg Oral Daily   apixaban  5 mg Oral BID   Chlorhexidine Gluconate Cloth  6 each Topical Daily   diphenhydrAMINE  25 mg Intravenous QHS   feeding supplement  237 mL Oral TID WC   feeding supplement (PROSource TF)  45 mL Per Tube BID   fentaNYL  1 patch Transdermal Q72H   free water  100 mL Per Tube TID   insulin aspart  0-9 Units Subcutaneous TID WC   metoprolol succinate  12.5 mg Oral Daily   pantoprazole (PROTONIX) IV  40 mg Intravenous QHS   sodium chloride flush  10-40 mL Intracatheter Q12H   thiamine  100 mg Oral Daily    Continuous Infusions:  sodium chloride Stopped (01/04/22 1748)   feeding supplement (OSMOLITE 1.5 CAL) Stopped (01/12/22 2200)    PRN Meds: sodium chloride, bisacodyl, haloperidol lactate, heparin lock flush, HYDROmorphone (DILAUDID) injection, HYDROmorphone HCl, lip balm, ondansetron **OR** ondansetron (ZOFRAN) IV, mouth rinse, phenol, sodium chloride flush  Physical Exam         Awake alert No distress Regular work of breathing S 1 S 2  No edema Has port  Vital Signs: BP 110/68 (BP Location: Left Arm)   Pulse 81   Temp 97.7 F (36.5 C) (Oral)   Resp 16   Ht '5\' 11"'$  (1.803 m)   Wt 76.5 kg   SpO2 98%   BMI 23.52 kg/m  SpO2: SpO2: 98 % O2 Device: O2 Device: Room Air O2 Flow  Rate: O2 Flow Rate (L/min): 0 L/min  Intake/output summary:  Intake/Output Summary (Last 24 hours) at 01/13/2022 1029 Last data filed at 01/13/2022 0300 Gross per 24 hour  Intake 779.33 ml  Output 1800 ml  Net -1020.67 ml    LBM: Last BM Date : 01/11/22 Baseline Weight: Weight: 84.7 kg Most recent weight: Weight: 76.5 kg  Palliative Assessment/Data:      Patient Active Problem List   Diagnosis Date Noted   Acute on chronic systolic heart failure ([place]) 01/08/2022   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[place]) 12/31/2021   Port-A-Cath in place 12/13/2021   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/29/2021   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 08/19/2019   Symptomatic bradycardia 08/19/2019   Bradycardia 08/18/2019   COVID-19 virus infection 08/17/2019   Atrial fibrillation ([place]) 08/17/2019   Hyperlipidemia 08/17/2019   Stage 3a chronic kidney disease (CKD) ([place]) 08/17/2019   Acute ischemic stroke (HCC) mult R MCA and PCA embolic infarcts d/t AF not on AC s/p tPA 08/15/2019   CARCINOMA, PROSTATE 09/15/2007   Essential hypertension 09/15/2007   Coronary artery disease 09/15/2007   HEMORRHOIDS, INTERNAL 09/15/2007   RADIATION PROCTITIS 09/15/2007    Palliative Care Assessment & Plan   Patient Profile:    Assessment: 88 year old gentleman admitted with pneumoperitoneum in the setting of known gastric cancer, status post ex lap drainage of intra-abdominal abscess, palliative care following for assistance with pain management.  Discussion/recommendations/Plan: DNR DNI Continue current mode of care  SNF rehab with palliative on discharge Continue current pain management regimen, on fentanyl patch.    Goals of Care and Additional Recommendations: Limitations on Scope of Treatment: Full Scope Treatment  Code Status: now DNR DNI.     Code Status Orders  (From admission, onward)           Start     Ordered   12/31/21 2012  Full code  Continuous        12/31/21  2011           Code Status History     Date Active Date Inactive Code Status Order ID Comments User Context   12/31/2021 1440 12/31/2021 2012 DNR [id number]  Garald Balding, PA-C ED   08/19/2019 0131 08/21/2019 1831 Full Code [id number]  Vianne Bulls, MD ED   08/15/2019 2100 08/17/2019 2210 Full Code [id number]  Amie Portland, MD ED      Advance Directive Documentation    [place] Most Recent Value  Type of Advance Directive Living will  Pre-existing out of facility DNR order (yellow form or pink MOST form) --  "MOST" Form in Place? --       Prognosis:  Guarded   Discharge Planning: SNF rehab with palliative.   Care plan was discussed with IDT   Thank you for allowing the Palliative Medicine Team to assist in the care of this patient.    MOD MDM.     Greater than 50%  of this time was spent counseling and coordinating care related to the above assessment and plan.  Loistine Chance, MD  Please contact Palliative Medicine Team phone at [phone] for questions and concerns.

## 2022-01-13 NOTE — TOC Progression Note (Signed)
Transition of Care [facility]) - Progression Note    Patient Details  Name: Dillon Smith MRN: [id number] Date of Birth: 1930/01/07  Transition of Care [facility]) CM/SW Contact  Johngabriel Verde, Juliann Pulse, RN Phone Number: 01/13/2022, 3:51 PM  Clinical Narrative: Noted NGT has been re placed back in;mittens in place-will await more stable prior faxing out for [facility] SNF. Noted for palliative care services @ SNF.     Expected Discharge Plan: [place] Barriers to Discharge: Continued Medical Work up  Expected Discharge Plan and Services Expected Discharge Plan: [place]   Discharge Planning Services: CM Consult   Living arrangements for the past 2 months: Single Family Home                                       Social Determinants of Health (SDOH) Interventions    Readmission Risk Interventions     No data to display

## 2022-01-13 NOTE — Progress Notes (Signed)
Progress Note  Patient Name: Dillon Smith Date of Encounter: 01/13/2022  [facility] Cardiologist: Lauree Chandler, MD   Subjective   Resting comfortably, very hard of hearing. Denies any pain or concerns. Feeding tube replaced this AM.  Inpatient Medications    Scheduled Meds:  amiodarone  200 mg Oral Daily   apixaban  5 mg Oral BID   Chlorhexidine Gluconate Cloth  6 each Topical Daily   diphenhydrAMINE  25 mg Intravenous QHS   feeding supplement  237 mL Oral TID WC   feeding supplement (PROSource TF)  45 mL Per Tube BID   fentaNYL  1 patch Transdermal Q72H   free water  100 mL Per Tube TID   insulin aspart  0-9 Units Subcutaneous TID WC   metoprolol succinate  12.5 mg Oral Daily   pantoprazole (PROTONIX) IV  40 mg Intravenous QHS   sodium chloride flush  10-40 mL Intracatheter Q12H   thiamine  100 mg Oral Daily   Continuous Infusions:  sodium chloride Stopped (01/04/22 1748)   feeding supplement (OSMOLITE 1.5 CAL) Stopped (01/12/22 2200)   PRN Meds: sodium chloride, bisacodyl, haloperidol lactate, heparin lock flush, HYDROmorphone (DILAUDID) injection, HYDROmorphone HCl, lip balm, ondansetron **OR** ondansetron (ZOFRAN) IV, mouth rinse, phenol, sodium chloride flush   Vital Signs    Vitals:   01/12/22 1211 01/12/22 1328 01/12/22 2110 01/13/22 0440  BP: 113/60 (!) 142/121 131/62 110/68  Pulse:  70 61 81  Resp:  '18 19 16  '$ Temp:  (!) 97.5 F (36.4 C) 98.4 F (36.9 C) 97.7 F (36.5 C)  TempSrc:   Oral Oral  SpO2:  98% 98% 98%  Weight:    76.5 kg  Height:        Intake/Output Summary (Last 24 hours) at 01/13/2022 1028 Last data filed at 01/13/2022 0300 Gross per 24 hour  Intake 779.33 ml  Output 1800 ml  Net -1020.67 ml      01/13/2022    4:40 AM 01/12/2022    4:13 AM 01/11/2022    5:00 AM  Last 3 Weights  Weight (lbs) 168 lb 10.4 oz 177 lb 11.1 oz 190 lb 7.6 oz  Weight (kg) 76.5 kg 80.6 kg 86.4 kg      Telemetry    Rate controlled atrial  fibrillation - Personally Reviewed  ECG    No new since 7/7 - Personally Reviewed  Physical Exam   GEN: Well nourished, well developed in no acute distress. Feeding tube in place. Mitts in place. HEENT: Normal, moist mucous membranes NECK: No JVD CARDIAC: irregularly irregular rhythm, normal S1 and S2, no rubs or gallops. No murmur. VASCULAR: Radial and DP pulses 2+ bilaterally. No carotid bruits RESPIRATORY:  Clear to auscultation without rales, wheezing or rhonchi  ABDOMEN: Soft, non-tender, non-distended MUSCULOSKELETAL:  Ambulates independently SKIN: Warm and dry, no edema NEUROLOGIC:  Alert and oriented x 3. No focal neuro deficits noted. PSYCHIATRIC:  Normal affect    Labs    High Sensitivity Troponin:   Recent Labs  Lab 12/31/21 0941 12/31/21 1143  TROPONINIHS 11 11     Chemistry Recent Labs  Lab 01/07/22 0430 01/08/22 0324 01/09/22 0934 01/10/22 0336 01/11/22 0304 01/12/22 0344 01/13/22 0504  NA 148*   < >  --  142 142 141 140  K 4.2   < >  --  4.1 4.1 4.0 3.8  CL 117*   < >  --  111 109 105 105  CO2 26   < >  --  $'26 27 28 29  'X$ GLUCOSE 150*   < >  --  123* 137* 122* 90  BUN 38*   < >  --  39* 42* 47* 49*  CREATININE 1.50*   < >  --  1.45* 1.45* 1.72* 1.70*  CALCIUM 8.7*   < >  --  8.3* 8.5* 8.2* 8.1*  MG 2.1   < > 1.7 1.8 2.0  --   --   PROT 5.6*  --   --  6.5 6.9  --   --   ALBUMIN 1.8*  --  1.7* 1.6* 1.8*  --   --   AST 30  --   --  38 39  --   --   ALT 36  --   --  40 42  --   --   ALKPHOS 84  --   --  71 72  --   --   BILITOT 0.7  --   --  0.8 0.5  --   --   GFRNONAA 44*   < >  --  46* 46* 37* 38*  ANIONGAP 5   < >  --  '5 6 8 6   '$ < > = values in this interval not displayed.    Lipids  Recent Labs  Lab 01/10/22 0336  TRIG 69    Hematology Recent Labs  Lab 01/10/22 0336 01/11/22 0304 01/12/22 0344  WBC 4.6 5.4 5.4  RBC 2.87* 3.01* 2.92*  HGB 8.7* 9.1* 8.8*  HCT 26.2* 27.7* 26.4*  MCV 91.3 92.0 90.4  MCH 30.3 30.2 30.1  MCHC 33.2  32.9 33.3  RDW 15.1 15.2 15.1  PLT 190 233 260   Thyroid No results for input(s): "TSH", "FREET4" in the last 168 hours.  BNP Recent Labs  Lab 01/09/22 0506  BNP 482.5*    DDimer No results for input(s): "DDIMER" in the last 168 hours.   Radiology    DG Abd Portable 1V  Result Date: 01/11/2022 CLINICAL DATA:  Feeding tube placement EXAM: PORTABLE ABDOMEN - 1 VIEW COMPARISON:  01/07/2022 FINDINGS: Tip of enteric tube is seen in the antrum of the stomach. There are ring-like densities in the left epigastrium, possibly a stent. Bowel gas pattern in the upper abdomen is unremarkable. Tip of central venous catheter is seen in superior vena cava close to the right atrium. IMPRESSION: Tip of feeding tube is seen in the antrum of the stomach. Electronically Signed   By: Elmer Picker M.D.   On: 01/11/2022 16:18    Cardiac Studies   Echo 01/03/2022  1. Left ventricular ejection fraction, by estimation, is 25 to 30%. The  left ventricle has severely decreased function. The left ventricle  demonstrates global hypokinesis. Left ventricular diastolic parameters are  consistent with Grade II diastolic  dysfunction (pseudonormalization). The average left ventricular global  longitudinal strain is -8.0 %. The global longitudinal strain is abnormal.   2. Right ventricular systolic function is mildly reduced. The right  ventricular size is moderately enlarged. There is mildly elevated  pulmonary artery systolic pressure. The estimated right ventricular  systolic pressure is 40.9 mmHg.   3. Left atrial size was severely dilated.   4. Right atrial size was moderately dilated.   5. The mitral valve is normal in structure. Moderate mitral valve  regurgitation. No evidence of mitral stenosis.   6. Tricuspid valve regurgitation is moderate.   7. The aortic valve is tricuspid. There is mild calcification of the  aortic valve.  There is mild thickening of the aortic valve. Aortic valve  regurgitation  is severe. Aortic valve sclerosis is present, with no  evidence of aortic valve stenosis.   8. The inferior vena cava is normal in size with greater than 50%  respiratory variability, suggesting right atrial pressure of 3 mmHg.   Comparison(s): Prior images reviewed side by side.   Patient Profile     88 y.o. male with PMH of gastric CA diagnosed 08/2021, longstanding persistent A-fib, CVA in 2021, hypertension, prostate cancer, hyperlipidemia, CKD stage III, and CAD status post CABG 2007 admitted with pneumoperitoneum and intra-abdominal abscess s/p ex lap drainage, cardiology service consulted for A-fib.  Echocardiogram obtained on 01/03/2022 showed EF 25 to 30% which is down from the previous 45-50% back in 2022.  Assessment & Plan    Longstanding persistent atrial fibrillation -CHA2DS2/VAS Stroke Risk Points=7  -continue apixaban 5 mg BID. Cr up from baseline last two days--has historically been less than 1.5. If Cr remains above 1.5, would change to 2.5 mg BID dose as he already has age criteria for dose reduction. Monitor Cr as it may improve now that diuretic on hold. -continue amiodarone 200 mg daily   Acute systolic and diastolic heart failure -EF dropped to 25% this admission -admission weight 84.7 kg, current weight 76.5 kg, though charted 10L positive -Cr increased from 1.45 -> 1.72 peak, 1.70 today. Holding diuretic. Appears euvolemic. -GDMT limited by low blood pressure, renal function. Blood pressure improving today but Cr also up.  -started low dose metoprolol succinate, tolerating  CAD s/p CABG -on atorvastatin as an outpatient -no aspirin as he is on apixaban  Per primary team/surgery/oncology: Pneumoperitoneum: s/p ex lap drainage   Recently diagnosed gastric cancer: neoadjuvant chemo on hold at this time, followed by hem/onc  For questions or updates, please contact [facility] Please consult [email or web address] for contact info under        Signed, Buford Dresser, MD  01/13/2022, 10:28 AM

## 2022-01-14 DIAGNOSIS — I482 Chronic atrial fibrillation, unspecified: Secondary | ICD-10-CM

## 2022-01-14 LAB — BASIC METABOLIC PANEL
Anion gap: 7 (ref 5–15)
BUN: 46 mg/dL — ABNORMAL HIGH (ref 8–23)
CO2: 28 mmol/L (ref 22–32)
Calcium: 8.2 mg/dL — ABNORMAL LOW (ref 8.9–10.3)
Chloride: 105 mmol/L (ref 98–111)
Creatinine, Ser: 1.66 mg/dL — ABNORMAL HIGH (ref 0.61–1.24)
GFR, Estimated: 39 mL/min — ABNORMAL LOW (ref >60)
Glucose, Bld: 131 mg/dL — ABNORMAL HIGH (ref 70–99)
Potassium: 3.7 mmol/L (ref 3.5–5.1)
Sodium: 140 mmol/L (ref 135–145)

## 2022-01-14 LAB — GLUCOSE, CAPILLARY
Glucose-Capillary: 119 mg/dL — ABNORMAL HIGH (ref 70–99)
Glucose-Capillary: 138 mg/dL — ABNORMAL HIGH (ref 70–99)
Glucose-Capillary: 80 mg/dL (ref 70–99)
Glucose-Capillary: 135 mg/dL — ABNORMAL HIGH (ref 70–99)

## 2022-01-14 MED ORDER — OSMOLITE 1.5 CAL PO LIQD: 1000.0000 mL | ORAL | Status: DC

## 2022-01-14 MED ORDER — OSMOLITE 1.5 CAL PO LIQD
1000.0000 mL | ORAL | Status: AC
  Administered 2022-01-14 – 2022-01-15 (×2): 1000 mL
  Filled 2022-01-14: qty 1000

## 2022-01-14 MED ORDER — APIXABAN 2.5 MG PO TABS
2.5000 mg | ORAL_TABLET | Freq: Two times a day (BID) | ORAL | Status: DC
  Administered 2022-01-14 – 2022-01-16 (×5): 2.5 mg via ORAL
  Filled 2022-01-14 (×5): qty 1

## 2022-01-14 MED ORDER — OSMOLITE 1.5 CAL PO LIQD
1000.0000 mL | ORAL | Status: DC
Start: 2022-01-17 — End: 2022-01-16

## 2022-01-14 MED ORDER — OSMOLITE 1.5 CAL PO LIQD
1000.0000 mL | ORAL | Status: DC
  Administered 2022-01-15: 1000 mL
  Filled 2022-01-14: qty 1000

## 2022-01-14 NOTE — Progress Notes (Signed)
Nutrition Follow-up  DOCUMENTATION CODES:   Not applicable  INTERVENTION:  - Osmolite 1.5 @ 35 ml/hr x18 hrs/day (1700-1100) to advance by 10 ml every 18 hours of run time to reach goal rate of 65 ml/hr with 45 ml Prosource TF BID and 100 ml free water TID.   - continue Ensure TID   NUTRITION DIAGNOSIS:   Increased nutrient needs related to cancer and cancer related treatments, post-op healing as evidenced by estimated needs. -ongoing  GOAL:   Patient will meet greater than or equal to 90% of their needs -unmet at this time  MONITOR:   PO intake, TF tolerance, Labs, Weight trends  ASSESSMENT:   88 year-old male with medical history of stage IIB gastric adenocarcinoma currently receiving chemo, Afib on eliquis, stage 3 CKD, HTN, prostate cancer, stroke, and HLD. He presented to the ED on 7/3 due to abdominal pain. In the ED, CT abdomen/pelvis showed intra-abdominal free air which was concerning for bowel perforation. On 7/3 he emergently underwent ex lap d/t perforation.  Patient sitting up in the chair with a family member at bedside during RD visit ~30 minutes ago. Patient denies abdominal pain, pressure, cramping, or nausea. He ate a few bites of lunch and has been drinking beverages to include [employer] ([employer]) oral nutrition supplement sent up on lunch tray.   Patient has small bore NGT in place since 7/16 though not listed in the [facility] avatar. Abdominal x-ray from yesterday states tip in the stomach.   Patient is currently receiving Osmolite 1.5 @ 20 ml/hr. Unsure how this rate was selected or when feeding was started. Tube feeding regimen order that is currently in place is Osmolite 1.5 @ 35 ml/hr x18 hrs/day (1700-1100) to advance by 10 ml every 18 hours of run time to reach goal rate of 65 ml/hr with 45 ml Prosource TF BID and 100 ml free water TID.   This regimen will provide 1835 kcal (80% kcal need), 95 grams protein (86% protein need), and 1191 ml free water. Able to  communicate with RN via secure chat.  Weight today is -15 lb compared to admission weight. Non-pitting edema to all extremities.   Calorie Count was started on 7/13 and day #1 results in RD note on 7/14. Meal tickets retained in bag on the door since 7/14 collected today. Minimal intake noted: 50% of oatmeal at breakfast on 7/15, 33% of mashed potatoes with gravy at lunch on 7/15, and 100% of mashed potatoes with gravy on 7/15. He drank 25% of an Ensure on 7/15 evening.   TPN off since 7/14 evening.    Labs reviewed; CBGs: 119 and 138 mg/dl, BUN: 46 mg/dl, creatinine: 1.66 mg/dl, Ca: 8.2 mg/dl, GFR: 39 ml/min.  Medications reviewed; sliding scale novolog, 100 mg oral thiamine/day.    Diet Order:   Diet Order             DIET DYS 2 Room service appropriate? Yes; Fluid consistency: Thin  Diet effective now                   EDUCATION NEEDS:   No education needs have been identified at this time  Skin:  Skin Assessment: Skin Integrity Issues: Skin Integrity Issues:: Incisions, Other (Comment) Incisions: abdomen (7/3) Other: IAD bilateral buttocks (newly documented on 7/14)  Last BM:  7/13 (type 6 x3, one large amount and 2 medium amounts)  Height:   Ht Readings from Last 1 Encounters:  12/31/21 '5\' 11"'$  (1.803 m)  Weight:   Wt Readings from Last 1 Encounters:  01/14/22 78.2 kg     BMI:  Body mass index is 24.04 kg/m.  Estimated Nutritional Needs:  Kcal:  2300-2500 kcal Protein:  110-125 grams Fluid:  >/= 2.3 L/day     Dillon Matin, MS, RD, LDN, CNSC Registered Dietitian II Inpatient Clinical Nutrition RD pager # and on-call/weekend pager # available in [facility]

## 2022-01-14 NOTE — Care Management Important Message (Signed)
Important Message  Patient Details IM Letter given to the Patient. Name: Dillon Smith MRN: [id number] Date of Birth: 11/29/1929   Medicare Important Message Given:  Yes     Kerin Salen 01/14/2022, 2:38 PM

## 2022-01-14 NOTE — Progress Notes (Signed)
14 Days Post-Op   Subjective/Chief Complaint: No complaints Up in a chair  Tolerating tube feeds   Objective: Vital signs in last 24 hours: Temp:  [97.4 F (36.3 C)-98.8 F (37.1 C)] 97.4 F (36.3 C) (07/17 1331) Pulse Rate:  [62-74] 62 (07/17 1331) Resp:  [22] 22 (07/17 1331) BP: (96-114)/(58-66) 103/58 (07/17 1331) SpO2:  [97 %-100 %] 100 % (07/17 1331) Weight:  [78.2 kg] 78.2 kg (07/17 0500) Last BM Date : 01/13/22  Intake/Output from previous day: 07/16 0701 - 07/17 0700 In: 1021.7 [P.O.:408; I.V.:110; NG/GT:503.7] Out: 1900 [Urine:1900] Intake/Output this shift: No intake/output data recorded.  Exam: Awake and alert Midline wound clean, good granulation tissue  Lab Results:  Recent Labs    01/12/22 0344  WBC 5.4  HGB 8.8*  HCT 26.4*  PLT 260   BMET Recent Labs    01/13/22 0504 01/14/22 0515  NA 140 140  K 3.8 3.7  CL 105 105  CO2 29 28  GLUCOSE 90 131*  BUN 49* 46*  CREATININE 1.70* 1.66*  CALCIUM 8.1* 8.2*   PT/INR No results for input(s): "LABPROT", "INR" in the last 72 hours. ABG No results for input(s): "PHART", "HCO3" in the last 72 hours.  Invalid input(s): "PCO2", "PO2"  Studies/Results: DG Abd 1 View  Result Date: 01/13/2022 CLINICAL DATA:  Feeding tube placement. EXAM: ABDOMEN - 1 VIEW COMPARISON:  01/11/2022 FINDINGS: Feeding tube remains in place with the tip in the body of the stomach. No evidence of dilated bowel loops. IMPRESSION: Feeding tube tip remains in the body of the stomach. Electronically Signed   By: Marlaine Hind M.D.   On: 01/13/2022 10:57    Anti-infectives: Anti-infectives (From admission, onward)    Start     Dose/Rate Route Frequency Ordered Stop   01/04/22 1200  piperacillin-tazobactam (ZOSYN) IVPB 3.375 g  Status:  Discontinued        3.375 g 12.5 mL/hr over 240 Minutes Intravenous Every 8 hours 01/04/22 0729 01/08/22 1116   01/02/22 1400  piperacillin-tazobactam (ZOSYN) IVPB 2.25 g  Status:  Discontinued         2.25 g 100 mL/hr over 30 Minutes Intravenous Every 8 hours 01/02/22 0718 01/04/22 0728   01/01/22 2000  vancomycin (VANCOCIN) IVPB 1000 mg/200 mL premix  Status:  Discontinued        1,000 mg 200 mL/hr over 60 Minutes Intravenous Every 24 hours 12/31/21 1820 01/01/22 0824   12/31/21 2000  piperacillin-tazobactam (ZOSYN) IVPB 3.375 g  Status:  Discontinued        3.375 g 12.5 mL/hr over 240 Minutes Intravenous Every 8 hours 12/31/21 1821 01/02/22 0718   12/31/21 1130  vancomycin (VANCOREADY) IVPB 1750 mg/350 mL        1,750 mg 175 mL/hr over 120 Minutes Intravenous  Once 12/31/21 1124 12/31/21 1411   12/31/21 1130  ceFEPIme (MAXIPIME) 2 g in sodium chloride 0.9 % 100 mL IVPB        2 g 200 mL/hr over 30 Minutes Intravenous  Once 12/31/21 1124 12/31/21 1226       Assessment/Plan: Pneumoperitoneum in the setting of known gastric cancer on chemotherapy POD #14 status post ex lap drainage of intra-abdominal abscess 7/3 Dr. Marlou Starks  Doing well Awaiting placement Wound healing well  [name] 01/14/2022

## 2022-01-14 NOTE — Progress Notes (Addendum)
Progress Note  Patient Name: Dillon Smith Date of Encounter: 01/14/2022  [facility] Cardiologist: Lauree Chandler, MD   Subjective   Patient denies chest pain. Appears to be hard of hearing.   Inpatient Medications    Scheduled Meds:  amiodarone  200 mg Oral Daily   apixaban  5 mg Oral BID   Chlorhexidine Gluconate Cloth  6 each Topical Daily   diphenhydrAMINE  25 mg Intravenous QHS   feeding supplement  237 mL Oral TID WC   feeding supplement (PROSource TF)  45 mL Per Tube BID   fentaNYL  1 patch Transdermal Q72H   free water  100 mL Per Tube TID   insulin aspart  0-9 Units Subcutaneous TID WC   metoprolol succinate  12.5 mg Oral Daily   mouth rinse  15 mL Mouth Rinse 4 times per day   pantoprazole (PROTONIX) IV  40 mg Intravenous QHS   sodium chloride flush  10-40 mL Intracatheter Q12H   thiamine  100 mg Oral Daily   Continuous Infusions:  sodium chloride Stopped (01/04/22 1748)   feeding supplement (OSMOLITE 1.5 CAL) 1,000 mL (01/13/22 1149)   PRN Meds: sodium chloride, bisacodyl, haloperidol lactate, heparin lock flush, HYDROmorphone (DILAUDID) injection, HYDROmorphone HCl, lip balm, ondansetron **OR** ondansetron (ZOFRAN) IV, mouth rinse, mouth rinse, phenol, sodium chloride flush   Vital Signs    Vitals:   01/13/22 1349 01/13/22 2120 01/14/22 0437 01/14/22 0500  BP: 122/70 96/62 114/66   Pulse: 76 68 74   Resp: 16  (!) 22   Temp: (!) 97.5 F (36.4 C) 98.8 F (37.1 C) 97.6 F (36.4 C)   TempSrc: Oral Oral Oral   SpO2: 100% 100% 97%   Weight:    78.2 kg  Height:        Intake/Output Summary (Last 24 hours) at 01/14/2022 0736 Last data filed at 01/14/2022 0408 Gross per 24 hour  Intake 1021.67 ml  Output 1700 ml  Net -678.33 ml      01/14/2022    5:00 AM 01/13/2022    4:40 AM 01/12/2022    4:13 AM  Last 3 Weights  Weight (lbs) 172 lb 6.4 oz 168 lb 10.4 oz 177 lb 11.1 oz  Weight (kg) 78.2 kg 76.5 kg 80.6 kg      Telemetry    Afib,  HR 70s, frequent PVCs - Personally Reviewed  ECG    No new - Personally Reviewed  Physical Exam   GEN: No acute distress.   Neck: No JVD Cardiac: Irreg Irreg, no murmurs, rubs, or gallops.  Respiratory: Clear to auscultation bilaterally. GI: Soft, nontender, non-distended  MS: No edema; No deformity. Neuro:  Nonfocal  Psych: Normal affect   Labs    High Sensitivity Troponin:   Recent Labs  Lab 12/31/21 0941 12/31/21 1143  TROPONINIHS 11 11     Chemistry Recent Labs  Lab 01/09/22 0934 01/10/22 0336 01/11/22 0304 01/12/22 0344 01/13/22 0504 01/14/22 0515  NA  --  142 142 141 140 140  K  --  4.1 4.1 4.0 3.8 3.7  CL  --  111 109 105 105 105  CO2  --  '26 27 28 29 28  '$ GLUCOSE  --  123* 137* 122* 90 131*  BUN  --  39* 42* 47* 49* 46*  CREATININE  --  1.45* 1.45* 1.72* 1.70* 1.66*  CALCIUM  --  8.3* 8.5* 8.2* 8.1* 8.2*  MG 1.7 1.8 2.0  --   --   --  PROT  --  6.5 6.9  --   --   --   ALBUMIN 1.7* 1.6* 1.8*  --   --   --   AST  --  38 39  --   --   --   ALT  --  40 42  --   --   --   ALKPHOS  --  71 72  --   --   --   BILITOT  --  0.8 0.5  --   --   --   GFRNONAA  --  46* 46* 37* 38* 39*  ANIONGAP  --  '5 6 8 6 7    '$ Lipids  Recent Labs  Lab 01/10/22 0336  TRIG 69    Hematology Recent Labs  Lab 01/10/22 0336 01/11/22 0304 01/12/22 0344  WBC 4.6 5.4 5.4  RBC 2.87* 3.01* 2.92*  HGB 8.7* 9.1* 8.8*  HCT 26.2* 27.7* 26.4*  MCV 91.3 92.0 90.4  MCH 30.3 30.2 30.1  MCHC 33.2 32.9 33.3  RDW 15.1 15.2 15.1  PLT 190 233 260   Thyroid No results for input(s): "TSH", "FREET4" in the last 168 hours.  BNP Recent Labs  Lab 01/09/22 0506  BNP 482.5*    DDimer No results for input(s): "DDIMER" in the last 168 hours.   Radiology    DG Abd 1 View  Result Date: 01/13/2022 CLINICAL DATA:  Feeding tube placement. EXAM: ABDOMEN - 1 VIEW COMPARISON:  01/11/2022 FINDINGS: Feeding tube remains in place with the tip in the body of the stomach. No evidence of dilated  bowel loops. IMPRESSION: Feeding tube tip remains in the body of the stomach. Electronically Signed   By: Marlaine Hind M.D.   On: 01/13/2022 10:57    Cardiac Studies     Echo 01/03/2022  1. Left ventricular ejection fraction, by estimation, is 25 to 30%. The  left ventricle has severely decreased function. The left ventricle  demonstrates global hypokinesis. Left ventricular diastolic parameters are  consistent with Grade II diastolic  dysfunction (pseudonormalization). The average left ventricular global  longitudinal strain is -8.0 %. The global longitudinal strain is abnormal.   2. Right ventricular systolic function is mildly reduced. The right  ventricular size is moderately enlarged. There is mildly elevated  pulmonary artery systolic pressure. The estimated right ventricular  systolic pressure is 24.2 mmHg.   3. Left atrial size was severely dilated.   4. Right atrial size was moderately dilated.   5. The mitral valve is normal in structure. Moderate mitral valve  regurgitation. No evidence of mitral stenosis.   6. Tricuspid valve regurgitation is moderate.   7. The aortic valve is tricuspid. There is mild calcification of the  aortic valve. There is mild thickening of the aortic valve. Aortic valve  regurgitation is severe. Aortic valve sclerosis is present, with no  evidence of aortic valve stenosis.   8. The inferior vena cava is normal in size with greater than 50%  respiratory variability, suggesting right atrial pressure of 3 mmHg.   Comparison(s): Prior images reviewed side by side.     Patient Profile     88 y.o. male PMH of gastric CA diagnosed 08/2021, longstanding persistent A-fib, CVA in 2021, hypertension, prostate cancer, hyperlipidemia, CKD stage III, and CAD status post CABG 2007 admitted with pneumoperitoneum and intra-abdominal abscess s/p ex lap drainage, cardiology service consulted for A-fib.  Echocardiogram obtained on 01/03/2022 showed EF 25 to 30% which is  down from the previous  45-50% back in 2022.  Assessment & Plan    Persistent Afib - continue amiodarone '200mg'$  daily - Continue Eliquis '5mg'$  BID>may need lower dose for kidney function  Acute on chronic systolic and diastolic  heart failure - Echo this admission showed reduced EF to 25%, down from 45% in 2021 - IV lasix stopped for worsening Scr>>improved today - chart reports +9.6L - Toprol 12.'5mg'$  daily added - No chest pain reported. no plan for invasive study, continue medical management - intermittent low BP limiting GDMT>>continue as able - appears relatively euvolemic on exam>can possibly start lasix '40mg'$  daily once he's back to baseline Scr around 1.4  CAD s/p CABG - no chest pain reported - EF reduced as above - no ASA 2/2 Eliquis - PTA atorvastatin - continue Toprol - No plan for ischemic work-up as above  Pneumoperitoneum - s/p ex lap drainage  For questions or updates, please contact [facility] Please consult [email or web address] for contact info under   Signed, Cadence Ninfa Meeker, PA-C  01/14/2022, 7:36 AM     As above, patient seen and examined.  He denies chest pain or dyspnea.  He is not volume overloaded on examination.  Heart rate is controlled.  We will continue amiodarone and low-dose metoprolol.  Continue apixaban but decrease dose to 2.5 mg twice daily as he is greater than 63 years of age and creatinine is greater than 1.5.  His LV function is reduced.  However his blood pressure is borderline I have therefore not added an ARB or Entresto.  We will follow from a distance. Kirk Ruths, MD

## 2022-01-15 LAB — GLUCOSE, CAPILLARY
Glucose-Capillary: 107 mg/dL — ABNORMAL HIGH (ref 70–99)
Glucose-Capillary: 125 mg/dL — ABNORMAL HIGH (ref 70–99)
Glucose-Capillary: 88 mg/dL (ref 70–99)
Glucose-Capillary: 91 mg/dL (ref 70–99)

## 2022-01-15 LAB — BASIC METABOLIC PANEL
Anion gap: 6 (ref 5–15)
BUN: 44 mg/dL — ABNORMAL HIGH (ref 8–23)
CO2: 29 mmol/L (ref 22–32)
Calcium: 8.4 mg/dL — ABNORMAL LOW (ref 8.9–10.3)
Chloride: 109 mmol/L (ref 98–111)
Creatinine, Ser: 1.6 mg/dL — ABNORMAL HIGH (ref 0.61–1.24)
GFR, Estimated: 41 mL/min — ABNORMAL LOW (ref >60)
Glucose, Bld: 123 mg/dL — ABNORMAL HIGH (ref 70–99)
Potassium: 3.8 mmol/L (ref 3.5–5.1)
Sodium: 144 mmol/L (ref 135–145)

## 2022-01-15 MED ORDER — PANTOPRAZOLE SODIUM 40 MG PO TBEC
40.0000 mg | DELAYED_RELEASE_TABLET | Freq: Every day | ORAL | Status: DC
  Administered 2022-01-15 – 2022-01-21 (×7): 40 mg via ORAL
  Filled 2022-01-15 (×7): qty 1

## 2022-01-15 MED ORDER — DIPHENHYDRAMINE HCL 25 MG PO CAPS
25.0000 mg | ORAL_CAPSULE | Freq: Every day | ORAL | Status: DC
  Administered 2022-01-15 – 2022-01-21 (×7): 25 mg via ORAL
  Filled 2022-01-15 (×7): qty 1

## 2022-01-15 NOTE — Progress Notes (Signed)
Physical Therapy Treatment Patient Details Name: Dillon Smith MRN: [id number] DOB: 11/29/1929 Today's Date: 01/15/2022   History of Present Illness 88 yo male admitted with gastric perforation. S/P ex lap, drainage of abscess. Hx of gastric ca, ckd, bradycardia, covid, prostate ca, cva, afib.    PT Comments    Pt progressing with his mobility.  General Comments: AxO x 2 VERY HOH.  Required repeat instructions.  Pleasant. Following commands.  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  He is wearing mittens to avoid pulling at NG feeding tube in right nare. Assisted to EOB was better.  General bed mobility comments: pt with increased self ability to transition to EOB requiring Max Assist + 1 vs Total Asisst + 2.  Once upright, pt was able to static sit at [facility] Assist approx 3 min.  Still present with poor kyphotic posture.  Does fatigue quickly. General transfer comment: pt was more able to self rise from elevated bed to partial stance no AD with Therapist partial "Bear Hug" and pt able to complete a few pivot steps to complete 1/4 turn to recliner.  Therapist asissted with controlling desend. Positioned in recliner with multiple pillows to comfort.   Pt will need ST Rehab at SNF to address mobility and functional decline prior to safely returning home.   Recommendations for follow up therapy are one component of a multi-disciplinary discharge planning process, led by the attending physician.  Recommendations may be updated based on patient status, additional functional criteria and insurance authorization.  Follow Up Recommendations  Skilled nursing-short term rehab (<3 hours/day) Can patient physically be transported by private vehicle: No   Assistance Recommended at Discharge Frequent or constant Supervision/Assistance  Patient can return home with the following Two people to help with walking and/or transfers;Two people to help with  bathing/dressing/bathroom;Assist for transportation;Help with stairs or ramp for entrance;Assistance with cooking/housework   Equipment Recommendations  None recommended by PT    Recommendations for Other Services       Precautions / Restrictions Precautions Precautions: Fall Precaution Comments: PICC, NG feeding line, ABD incision, tele, PremoFit Restrictions Weight Bearing Restrictions: No     Mobility  Bed Mobility Overal bed mobility: Needs Assistance Bed Mobility: Supine to Sit     Supine to sit: Max assist     General bed mobility comments: pt with increased self ability to transition to EOB requiring Max Assist + 1 vs Total Asisst + 2.  Once upright, pt was able to static sit at [facility] Assist approx 3 min.  Still present with poor kyphotic posture.  Does fatigue quickly.    Transfers Overall transfer level: Needs assistance Equipment used: None Transfers: Bed to chair/wheelchair/BSC Sit to Stand: Mod assist, Max assist           General transfer comment: pt was more able to self rise from elevated bed to partial stance no AD with Therapist partial "Bear Hug" and pt able to complete a few pivot steps to complete 1/4 turn to recliner.  Therapist asissted with controlling desend.    Ambulation/Gait               General Gait Details: transfers only this session due to profound weakness   Stairs             Wheelchair Mobility    Modified Rankin (Stroke Patients Only)       Balance  Cognition Arousal/Alertness: Awake/alert Behavior During Therapy: WFL for tasks assessed/performed Overall Cognitive Status: No family/caregiver present to determine baseline cognitive functioning Area of Impairment: Memory, Problem solving, Following commands, Attention, Safety/judgement, Awareness                               General Comments: AxO x 2 VERY HOH.  Required repeat  instructions.  Pleasant. Following commands.  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  He is wearing mittens to avoid pulling at NG feeding tube in right nare.        Exercises      General Comments        Pertinent Vitals/Pain Pain Assessment Pain Assessment: No/denies pain Pain Location: minimal grimacing Pain Descriptors / Indicators: Grimacing Pain Intervention(s): Monitored during session, Repositioned    Home Living                          Prior Function            PT Goals (current goals can now be found in the care plan section) Progress towards PT goals: Progressing toward goals    Frequency    Min 2X/week      PT Plan Current plan remains appropriate    Co-evaluation              AM-PAC PT "6 Clicks" Mobility   Outcome Measure  Help needed turning from your back to your side while in a flat bed without using bedrails?: A Lot Help needed moving from lying on your back to sitting on the side of a flat bed without using bedrails?: A Lot Help needed moving to and from a bed to a chair (including a wheelchair)?: A Lot Help needed standing up from a chair using your arms (e.g., wheelchair or bedside chair)?: A Lot Help needed to walk in hospital room?: Total Help needed climbing 3-5 steps with a railing? : Total 6 Click Score: 10    End of Session Equipment Utilized During Treatment: Gait belt Activity Tolerance: Patient limited by fatigue Patient left: in chair;with chair alarm set;with call bell/phone within reach Nurse Communication: Mobility status PT Visit Diagnosis: Pain;Muscle weakness (generalized) (M62.81);Difficulty in walking, not elsewhere classified (R26.2)     Time: 3557-3220 PT Time Calculation (min) (ACUTE ONLY): 25 min  Charges:  $Therapeutic Activity: 23-37 mins                     {Makaylah Oddo  PTA Acute  Rehabilitation Services Office M-F          [phone] Weekend pager  [phone]

## 2022-01-15 NOTE — Progress Notes (Signed)
PHARMACIST - PHYSICIAN COMMUNICATION  DR:   CCS  CONCERNING: IV to Oral Route Change Policy  RECOMMENDATION: This patient is receiving protonix and diphenhydramine by the intravenous route.  Based on criteria approved by the Pharmacy and Therapeutics Committee, the intravenous medication(s) is/are being converted to the equivalent oral dose form(s).   DESCRIPTION: These criteria include: The patient is eating (either orally or via tube) and/or has been taking other orally administered medications for a least 24 hours The patient has no evidence of active gastrointestinal bleeding or impaired GI absorption (gastrectomy, short bowel, patient on TNA or NPO).  If you have questions about this conversion, please contact the Pharmacy Department  '[]'$   [phone] )  [facility] '[]'$   [phone] )  [facility] '[]'$   [phone] )  [facility] '[]'$   [phone] )  [facility] '[x]'$   [phone] )  [place], PharmD, [place]: [phone] 01/15/2022 8:02 AM

## 2022-01-15 NOTE — Progress Notes (Signed)
Progress Note  15 Days Post-Op  Subjective: Pt very hard of hearing. No family at bedside this AM. Denies abdominal pain or nausea. He is unsure whether he is having bowel function. He just had gotten up to chair with PT.   Objective: Vital signs in last 24 hours: Temp:  [97.6 F (36.4 C)-98.2 F (36.8 C)] 98.2 F (36.8 C) (07/18 1317) Pulse Rate:  [60-69] 60 (07/18 1317) Resp:  [18-20] 18 (07/18 1317) BP: (111-128)/(51-66) 111/51 (07/18 1317) SpO2:  [100 %] 100 % (07/18 1317) Weight:  [78.8 kg] 78.8 kg (07/18 0500) Last BM Date : 01/13/22  Intake/Output from previous day: 07/17 0701 - 07/18 0700 In: 1514.1 [P.O.:720; NG/GT:794.1] Out: 800 [Urine:800] Intake/Output this shift: Total I/O In: 220 [P.O.:220] Out: -   PE: General: pleasant, WD male who is laying in bed in NAD Heart: regular, rate, and rhythm.  Lungs: CTAB, no wheezes, rhonchi, or rales noted.  Respiratory effort nonlabored Abd: soft, NT, ND, +BS, midline wound clean    Lab Results:  No results for input(s): "WBC", "HGB", "HCT", "PLT" in the last 72 hours. BMET Recent Labs    01/14/22 0515 01/15/22 0420  NA 140 144  K 3.7 3.8  CL 105 109  CO2 28 29  GLUCOSE 131* 123*  BUN 46* 44*  CREATININE 1.66* 1.60*  CALCIUM 8.2* 8.4*   PT/INR No results for input(s): "LABPROT", "INR" in the last 72 hours. CMP     Component Value Date/Time   NA 144 01/15/2022 0420   NA 143 02/06/2021 0845   K 3.8 01/15/2022 0420   CL 109 01/15/2022 0420   CO2 29 01/15/2022 0420   GLUCOSE 123 (H) 01/15/2022 0420   BUN 44 (H) 01/15/2022 0420   BUN 23 02/06/2021 0845   CREATININE 1.60 (H) 01/15/2022 0420   CREATININE 1.57 (H) 12/13/2021 0827   CALCIUM 8.4 (L) 01/15/2022 0420   PROT 6.9 01/11/2022 0304   PROT 6.4 02/06/2021 0845   ALBUMIN 1.8 (L) 01/11/2022 0304   ALBUMIN 3.9 02/06/2021 0845   AST 39 01/11/2022 0304   AST 18 12/13/2021 0827   ALT 42 01/11/2022 0304   ALT 17 12/13/2021 0827   ALKPHOS 72  01/11/2022 0304   BILITOT 0.5 01/11/2022 0304   BILITOT 0.5 12/13/2021 0827   GFRNONAA 41 (L) 01/15/2022 0420   GFRNONAA 42 (L) 12/13/2021 0827   GFRAA 43 (L) 07/06/2020 1156   Lipase     Component Value Date/Time   LIPASE 35 12/31/2021 0941       Studies/Results: No results found.  Anti-infectives: Anti-infectives (From admission, onward)    Start     Dose/Rate Route Frequency Ordered Stop   01/04/22 1200  piperacillin-tazobactam (ZOSYN) IVPB 3.375 g  Status:  Discontinued        3.375 g 12.5 mL/hr over 240 Minutes Intravenous Every 8 hours 01/04/22 0729 01/08/22 1116   01/02/22 1400  piperacillin-tazobactam (ZOSYN) IVPB 2.25 g  Status:  Discontinued        2.25 g 100 mL/hr over 30 Minutes Intravenous Every 8 hours 01/02/22 0718 01/04/22 0728   01/01/22 2000  vancomycin (VANCOCIN) IVPB 1000 mg/200 mL premix  Status:  Discontinued        1,000 mg 200 mL/hr over 60 Minutes Intravenous Every 24 hours 12/31/21 1820 01/01/22 0824   12/31/21 2000  piperacillin-tazobactam (ZOSYN) IVPB 3.375 g  Status:  Discontinued        3.375 g 12.5 mL/hr over 240 Minutes Intravenous  Every 8 hours 12/31/21 1821 01/02/22 0718   12/31/21 1130  vancomycin (VANCOREADY) IVPB 1750 mg/350 mL        1,750 mg 175 mL/hr over 120 Minutes Intravenous  Once 12/31/21 1124 12/31/21 1411   12/31/21 1130  ceFEPIme (MAXIPIME) 2 g in sodium chloride 0.9 % 100 mL IVPB        2 g 200 mL/hr over 30 Minutes Intravenous  Once 12/31/21 1124 12/31/21 1226        Assessment/Plan Pneumoperitoneum in the setting of known gastric cancer on chemotherapy POD #15 status post ex lap drainage of intra-abdominal abscess 7/3 Dr. Marlou Starks  - continue wound care - healing well  - drains out - on TF to supplement nutrition with poor PO intake, ensure as well  - not a candidate for placement of PEG or gastrostomy given known gastric cancer with recent perforation  - cardiology was following for diuresis but has signed off - [facility]  was following for chronic medical issues but signed off 7/12 - palliative following for [facility] as well - at this point patient is surgically stable and this is really more of a placement issue - TOC working on this  - continue therapies   FEN: D1 diet, ensure, TF for supplementation  VTE: eliquis BID ID: currently off abx  CKD stage III Hx of CVA A. Fib on eliquis HTN HLD CAD Hx of prostate cancer    LOS: 15 days     Norm Parcel, [facility] 01/15/2022, 3:17 PM Please see Amion for pager number during day hours 7:00am-4:30pm

## 2022-01-15 NOTE — Progress Notes (Signed)
Speech Language Pathology Treatment: Dysphagia  Patient Details Name: Dillon Smith MRN: [id number] DOB: 10-23-1929 Today's Date: 01/15/2022 Time: 0998-3382 SLP Time Calculation (min) (ACUTE ONLY): 18 min  Assessment / Plan / Recommendation Clinical Impression  Pt seen for dysphagia treatment with Dys 2 texture, thin liquids. RN tech reported pocketing with grits this am then expectorated. Upper partial and lower denture plate donned however, upper repeatedly fell from upper gum and subsequently removed. Tray resembled more of a Dys 3 texture with pot roast cut into small pieces and steamed broccoli. Pt masticated beef for approximately 15 seconds before he expectorated and broccoli not attempted. Thin liquids without overt s/s aspiration with inconsistent throat clears out of approximately 6-7 oz via straw. Therapist downgraded texture to puree with hopeful increased in intake and with removal of NGT when appropriate. Crush meds, sit upright and check oral cavity for pocketing. Continue thin liquids with full assist with meals/snacks.    HPI HPI: 88yo male admitted 12/31/21 with abdominal pain. PMH: HTN, prostate cancer, HLD, AFib, CKD3, CVA, CAD s/p CABG, current gastric adenocarcinoma undergoing chemotherapy.      SLP Plan  Continue with current plan of care      Recommendations for follow up therapy are one component of a multi-disciplinary discharge planning process, led by the attending physician.  Recommendations may be updated based on patient status, additional functional criteria and insurance authorization.    Recommendations  Diet recommendations: Dysphagia 1 (puree);Thin liquid Liquids provided via: Cup;Straw Medication Administration: Crushed with puree Supervision: Staff to assist with self feeding;Full supervision/cueing for compensatory strategies Compensations: Minimize environmental distractions;Slow rate;Small sips/bites;Lingual sweep for clearance of  pocketing Postural Changes and/or Swallow Maneuvers: Seated upright 90 degrees                Oral Care Recommendations: Oral care BID Follow Up Recommendations: Skilled nursing-short term rehab (<3 hours/day) Assistance recommended at discharge: Frequent or constant Supervision/Assistance SLP Visit Diagnosis: Dysphagia, unspecified (R13.10) Plan: Continue with current plan of care           Houston Siren  01/15/2022, 11:59 AM

## 2022-01-15 NOTE — TOC Progression Note (Signed)
Transition of Care [facility]) - Progression Note    Patient Details  Name: Dillon Smith MRN: [id number] Date of Birth: 11/29/1929  Transition of Care [facility]) CM/SW Contact  Lewi Drost, Juliann Pulse, RN Phone Number: 01/15/2022, 11:16 AM  Clinical Narrative:Currently in hand mitts,NGT;TF not @ goal-will continue to monitor since not appropriate for fl2 initiation  for SNF until more medical stable.       Expected Discharge Plan: [place] Barriers to Discharge: Continued Medical Work up  Expected Discharge Plan and Services Expected Discharge Plan: [place]   Discharge Planning Services: CM Consult   Living arrangements for the past 2 months: Single Family Home                                       Social Determinants of Health (SDOH) Interventions    Readmission Risk Interventions     No data to display

## 2022-01-16 DIAGNOSIS — I4819 Other persistent atrial fibrillation: Secondary | ICD-10-CM | POA: Diagnosis not present

## 2022-01-16 DIAGNOSIS — N1831 Chronic kidney disease, stage 3a: Secondary | ICD-10-CM | POA: Diagnosis not present

## 2022-01-16 DIAGNOSIS — I5023 Acute on chronic systolic (congestive) heart failure: Secondary | ICD-10-CM | POA: Diagnosis not present

## 2022-01-16 LAB — GLUCOSE, CAPILLARY
Glucose-Capillary: 120 mg/dL — ABNORMAL HIGH (ref 70–99)
Glucose-Capillary: 143 mg/dL — ABNORMAL HIGH (ref 70–99)
Glucose-Capillary: 91 mg/dL (ref 70–99)

## 2022-01-16 MED ORDER — QUETIAPINE FUMARATE 25 MG PO TABS
25.0000 mg | ORAL_TABLET | Freq: Every day | ORAL | Status: DC
  Administered 2022-01-16: 25 mg via ORAL
  Filled 2022-01-16: qty 1

## 2022-01-16 NOTE — Progress Notes (Signed)
[facility] PROGRESS NOTE   Dillon Smith XAJ:[id number] DOB: 11/29/1929 DOA: 12/31/2021  PCP: [name]  Brief History/Interval Summary: 88 y.o. male past medical history of chronic kidney disease stage III, paroxysmal atrial fibrillation on anticoagulation at home, retention history of gastric cancer was admitted by the surgical service as work-up revealed pneumoperitoneum due to gastric perforation. Was taken to the OR and is status post exploratory laparotomy and drainage of intra-abdominal abscess on 7/3.  Initially tried hospitalist was consulted for medical management.  Patient has stabilized.  General surgery requesting hospitalist to take over care.    Consultants: General surgery.  Cardiology.  Palliative care.  Previously seen by critical care medicine.  Procedures: Exploratory laparotomy with drainage of intra-abdominal abscess 7/3    Subjective/Interval History: Patient pleasantly confused.  Does not appear to be in any discomfort.  Hard of hearing.  Denies any pain.  Looks like he pulled out his nasogastric feeding tube this morning.    Assessment/Plan:  Pneumoperitoneum in the setting of gastric cancer perforation on chemotherapy: Status post surgical intervention with exploratory laparotomy and intra-abdominal abscesses on 7/3. General surgery following and managing.  Patient had a core track feeding tube.  He pulled it out today.  General surgery leaving it out for now. Currently on dysphagia 1 diet. Pain seems to be adequately well controlled. Palliative care was consulted for pain management.  Noted to be on fentanyl patch.    Acute kidney injury on chronic kidney disease stage IIIa: Looks like baseline creatinine is between 1.3-1.7.  Presented with creatinine greater than 2.  Stabilized.  Renal function stable for the most part.  Monitor urine output.    Acute on chronic systolic and diastolic CHF EF known to be 25% based on echo done  during this hospitalization.  Down from 45% in 2021. Cardiology has been following.  Patient noted to be on metoprolol.  Diuretics on hold for now.  Due to borderline blood pressure patient is not on ARB or ACE inhibitor.  Elevated creatinine also precludes this for now.  Persistent atrial fibrillation Cardiology has been managing.  Noted to be on amiodarone and metoprolol.  Also on apixaban for anticoagulation.  Essential hypertension: Continue to monitor blood pressures closely.     Hyperlipidemia: Should be able to resume statin once able to take orally.  Hyperglycemia No diabetic medications noted on home medication list.  Elevated CBGs likely due to tube feedings.  Will check HbA1c.  Noted to be on SSI presumably because he was on tube feedings.   Normocytic anemia Likely anemia of chronic disease.  No evidence of overt blood loss.  Check hemoglobin tomorrow.  Goals of care/ethics: Palliative care following.  Patient is noted to be DNR.  Prognosis is guarded.    DVT Prophylaxis: On apixaban Code Status: DNR Family Communication: No family at bedside Disposition Plan: Skilled nursing facility is recommended  Status is: Inpatient Remains inpatient appropriate because: Pneumoperitoneum, gastric cancer, poor oral intake      Medications: Scheduled:  amiodarone  200 mg Oral Daily   apixaban  2.5 mg Oral BID   Chlorhexidine Gluconate Cloth  6 each Topical Daily   diphenhydrAMINE  25 mg Oral QHS   feeding supplement  237 mL Oral TID WC   feeding supplement (OSMOLITE 1.5 CAL)  1,000 mL Per Tube Q24H   feeding supplement (OSMOLITE 1.5 CAL)  1,000 mL Per Tube Q24H   feeding supplement (PROSource TF)  45 mL Per Tube BID  fentaNYL  1 patch Transdermal Q72H   free water  100 mL Per Tube TID   insulin aspart  0-9 Units Subcutaneous TID WC   metoprolol succinate  12.5 mg Oral Daily   mouth rinse  15 mL Mouth Rinse 4 times per day   pantoprazole  40 mg Oral QHS   sodium chloride  flush  10-40 mL Intracatheter Q12H   thiamine  100 mg Oral Daily   Continuous:  sodium chloride Stopped (01/04/22 1748)   [START ON 01/17/2022] feeding supplement (OSMOLITE 1.5 CAL)     VEH:MCNOBS chloride, bisacodyl, haloperidol lactate, heparin lock flush, HYDROmorphone (DILAUDID) injection, HYDROmorphone HCl, lip balm, ondansetron **OR** ondansetron (ZOFRAN) IV, mouth rinse, mouth rinse, phenol, sodium chloride flush  Antibiotics: Anti-infectives (From admission, onward)    Start     Dose/Rate Route Frequency Ordered Stop   01/04/22 1200  piperacillin-tazobactam (ZOSYN) IVPB 3.375 g  Status:  Discontinued        3.375 g 12.5 mL/hr over 240 Minutes Intravenous Every 8 hours 01/04/22 0729 01/08/22 1116   01/02/22 1400  piperacillin-tazobactam (ZOSYN) IVPB 2.25 g  Status:  Discontinued        2.25 g 100 mL/hr over 30 Minutes Intravenous Every 8 hours 01/02/22 0718 01/04/22 0728   01/01/22 2000  vancomycin (VANCOCIN) IVPB 1000 mg/200 mL premix  Status:  Discontinued        1,000 mg 200 mL/hr over 60 Minutes Intravenous Every 24 hours 12/31/21 1820 01/01/22 0824   12/31/21 2000  piperacillin-tazobactam (ZOSYN) IVPB 3.375 g  Status:  Discontinued        3.375 g 12.5 mL/hr over 240 Minutes Intravenous Every 8 hours 12/31/21 1821 01/02/22 0718   12/31/21 1130  vancomycin (VANCOREADY) IVPB 1750 mg/350 mL        1,750 mg 175 mL/hr over 120 Minutes Intravenous  Once 12/31/21 1124 12/31/21 1411   12/31/21 1130  ceFEPIme (MAXIPIME) 2 g in sodium chloride 0.9 % 100 mL IVPB        2 g 200 mL/hr over 30 Minutes Intravenous  Once 12/31/21 1124 12/31/21 1226       Objective:  Vital Signs  Vitals:   01/15/22 1317 01/15/22 1956 01/16/22 0443 01/16/22 0500  BP: (!) 111/51 129/68 125/69 124/72  Pulse: 60 64 72 81  Resp: '18 19  18  '$ Temp: 98.2 F (36.8 C) 98.2 F (36.8 C) 98.7 F (37.1 C) 98.1 F (36.7 C)  TempSrc:  Oral Oral Oral  SpO2: 100% 100% 97% 98%  Weight:    79.1 kg  Height:         Intake/Output Summary (Last 24 hours) at 01/16/2022 1026 Last data filed at 01/16/2022 0400 Gross per 24 hour  Intake 1365 ml  Output 850 ml  Net 515 ml   Filed Weights   01/14/22 0500 01/15/22 0500 01/16/22 0500  Weight: 78.2 kg 78.8 kg 79.1 kg    General appearance: Awake alert.  In no distress.  Distracted Resp: Clear to auscultation bilaterally.  Normal effort Cardio: S1-S2 is normal regular.  No S3-S4.  No rubs murmurs or bruit GI: Abdomen is soft.   Extremities: No edema.  Moving all of his extremities Neurologic:  No focal neurological deficits.    Lab Results:  Data Reviewed: I have personally reviewed following labs and reports of the imaging studies  CBC: Recent Labs  Lab 01/10/22 0336 01/11/22 0304 01/12/22 0344  WBC 4.6 5.4 5.4  HGB 8.7* 9.1* 8.8*  HCT 26.2*  27.7* 26.4*  MCV 91.3 92.0 90.4  PLT 190 233 841    Basic Metabolic Panel: Recent Labs  Lab 01/10/22 0336 01/11/22 0304 01/12/22 0344 01/13/22 0504 01/14/22 0515 01/15/22 0420  NA 142 142 141 140 140 144  K 4.1 4.1 4.0 3.8 3.7 3.8  CL 111 109 105 105 105 109  CO2 '26 27 28 29 28 29  '$ GLUCOSE 123* 137* 122* 90 131* 123*  BUN 39* 42* 47* 49* 46* 44*  CREATININE 1.45* 1.45* 1.72* 1.70* 1.66* 1.60*  CALCIUM 8.3* 8.5* 8.2* 8.1* 8.2* 8.4*  MG 1.8 2.0  --   --   --   --   PHOS 3.5 4.0  --   --   --   --     GFR: Estimated Creatinine Clearance: 32.7 mL/min (A) (by C-G formula based on SCr of 1.6 mg/dL (H)).  Liver Function Tests: Recent Labs  Lab 01/10/22 0336 01/11/22 0304  AST 38 39  ALT 40 42  ALKPHOS 71 72  BILITOT 0.8 0.5  PROT 6.5 6.9  ALBUMIN 1.6* 1.8*      CBG: Recent Labs  Lab 01/15/22 0730 01/15/22 1129 01/15/22 1653 01/15/22 1957 01/16/22 0730  GLUCAP 107* 125* 88 91 120*      Radiology Studies: No results found.     LOS: 16 days   Aden Youngman [employer] on [email or web address]  01/16/2022, 10:26 AM

## 2022-01-16 NOTE — Progress Notes (Addendum)
Progress Note  Patient Name: Dillon Smith Date of Encounter: 01/16/2022  [facility] Cardiologist: Lauree Chandler, MD   Subjective   Patient is hard of hearing, seems mildly cognitive impaired on exam today, history is limited. He states he is doing fine. He offered no complaints.    Inpatient Medications    Scheduled Meds:  amiodarone  200 mg Oral Daily   apixaban  2.5 mg Oral BID   Chlorhexidine Gluconate Cloth  6 each Topical Daily   diphenhydrAMINE  25 mg Oral QHS   feeding supplement  237 mL Oral TID WC   feeding supplement (OSMOLITE 1.5 CAL)  1,000 mL Per Tube Q24H   feeding supplement (OSMOLITE 1.5 CAL)  1,000 mL Per Tube Q24H   feeding supplement (PROSource TF)  45 mL Per Tube BID   fentaNYL  1 patch Transdermal Q72H   free water  100 mL Per Tube TID   insulin aspart  0-9 Units Subcutaneous TID WC   metoprolol succinate  12.5 mg Oral Daily   mouth rinse  15 mL Mouth Rinse 4 times per day   pantoprazole  40 mg Oral QHS   sodium chloride flush  10-40 mL Intracatheter Q12H   thiamine  100 mg Oral Daily   Continuous Infusions:  sodium chloride Stopped (01/04/22 1748)   [START ON 01/17/2022] feeding supplement (OSMOLITE 1.5 CAL)     PRN Meds: sodium chloride, bisacodyl, haloperidol lactate, heparin lock flush, HYDROmorphone (DILAUDID) injection, HYDROmorphone HCl, lip balm, ondansetron **OR** ondansetron (ZOFRAN) IV, mouth rinse, mouth rinse, phenol, sodium chloride flush   Vital Signs    Vitals:   01/15/22 1317 01/15/22 1956 01/16/22 0443 01/16/22 0500  BP: (!) 111/51 129/68 125/69 124/72  Pulse: 60 64 72 81  Resp: '18 19  18  '$ Temp: 98.2 F (36.8 C) 98.2 F (36.8 C) 98.7 F (37.1 C) 98.1 F (36.7 C)  TempSrc:  Oral Oral Oral  SpO2: 100% 100% 97% 98%  Weight:    79.1 kg  Height:        Intake/Output Summary (Last 24 hours) at 01/16/2022 0813 Last data filed at 01/16/2022 0400 Gross per 24 hour  Intake 1415 ml  Output 850 ml  Net 565 ml       01/16/2022    5:00 AM 01/15/2022    5:00 AM 01/14/2022    5:00 AM  Last 3 Weights  Weight (lbs) 174 lb 6.1 oz 173 lb 11.6 oz 172 lb 6.4 oz  Weight (kg) 79.1 kg 78.8 kg 78.2 kg      Telemetry    A fib with ventricular rate of 70s, PVCs occasionally  - Personally Reviewed  ECG    No new tracing today - Personally Reviewed  Physical Exam   GEN: No acute distress.   Neck: No JVD. Anterior neck with large soft tissue mass noted.  Cardiac: Irreg Irreg, no murmurs, rubs, or gallops.  Respiratory: Clear to auscultation bilaterally. On room air.  GI: Soft, abdominal surgical dressing in place not interrupted for exam  MS: No leg edema Neuro: Alert and oriented to self, mild cognitive impairment  Psych: Normal affect  Right sided chest med-port with intact dressing    Labs    High Sensitivity Troponin:   Recent Labs  Lab 12/31/21 0941 12/31/21 1143  TROPONINIHS 11 [place]  Lab 01/09/22 0934 01/10/22 0336 01/10/22 0336 01/11/22 0304 01/12/22 0344 01/13/22 0504 01/14/22 0515 01/15/22 0420  NA  --  142   < > 142   < > 140 140 144  K  --  4.1   < > 4.1   < > 3.8 3.7 3.8  CL  --  111   < > 109   < > 105 105 109  CO2  --  26   < > 27   < > '29 28 29  '$ GLUCOSE  --  123*   < > 137*   < > 90 131* 123*  BUN  --  39*   < > 42*   < > 49* 46* 44*  CREATININE  --  1.45*   < > 1.45*   < > 1.70* 1.66* 1.60*  CALCIUM  --  8.3*   < > 8.5*   < > 8.1* 8.2* 8.4*  MG 1.7 1.8  --  2.0  --   --   --   --   PROT  --  6.5  --  6.9  --   --   --   --   ALBUMIN 1.7* 1.6*  --  1.8*  --   --   --   --   AST  --  38  --  39  --   --   --   --   ALT  --  40  --  42  --   --   --   --   ALKPHOS  --  71  --  72  --   --   --   --   BILITOT  --  0.8  --  0.5  --   --   --   --   GFRNONAA  --  46*   < > 46*   < > 38* 39* 41*  ANIONGAP  --  5   < > 6   < > '6 7 6   '$ < > = values in this interval not displayed.    Lipids  Recent Labs  Lab 01/10/22 0336  TRIG 69     Hematology Recent Labs  Lab 01/10/22 0336 01/11/22 0304 01/12/22 0344  WBC 4.6 5.4 5.4  RBC 2.87* 3.01* 2.92*  HGB 8.7* 9.1* 8.8*  HCT 26.2* 27.7* 26.4*  MCV 91.3 92.0 90.4  MCH 30.3 30.2 30.1  MCHC 33.2 32.9 33.3  RDW 15.1 15.2 15.1  PLT 190 233 260   Thyroid No results for input(s): "TSH", "FREET4" in the last 168 hours.  BNP No results for input(s): "BNP", "PROBNP" in the last 168 hours.   DDimer No results for input(s): "DDIMER" in the last 168 hours.   Radiology    No results found.  Cardiac Studies     Echo 01/03/2022:   1. Left ventricular ejection fraction, by estimation, is 25 to 30%. The  left ventricle has severely decreased function. The left ventricle  demonstrates global hypokinesis. Left ventricular diastolic parameters are  consistent with Grade II diastolic  dysfunction (pseudonormalization). The average left ventricular global  longitudinal strain is -8.0 %. The global longitudinal strain is abnormal.   2. Right ventricular systolic function is mildly reduced. The right  ventricular size is moderately enlarged. There is mildly elevated  pulmonary artery systolic pressure. The estimated right ventricular  systolic pressure is 85.4 mmHg.   3. Left atrial size was severely dilated.   4. Right atrial size was moderately dilated.   5. The mitral valve is normal in structure. Moderate mitral valve  regurgitation. No evidence of  mitral stenosis.   6. Tricuspid valve regurgitation is moderate.   7. The aortic valve is tricuspid. There is mild calcification of the  aortic valve. There is mild thickening of the aortic valve. Aortic valve  regurgitation is severe. Aortic valve sclerosis is present, with no  evidence of aortic valve stenosis.   8. The inferior vena cava is normal in size with greater than 50%  respiratory variability, suggesting right atrial pressure of 3 mmHg.   Comparison(s): Prior images reviewed side by side.     Patient Profile      88 y.o. male PMH of gastric CA diagnosed 08/2021, longstanding persistent A-fib, CVA in 2021, hypertension, prostate cancer, hyperlipidemia, CKD stage III, and CAD status post CABG 2007 admitted with pneumoperitoneum and intra-abdominal abscess s/p ex lap drainage, cardiology service consulted for A-fib.  Echocardiogram obtained on 01/03/2022 showed EF 25 to 30% which is down from the previous 45-50% back in 2021.  Assessment & Plan    Persistent Afib - continue amiodarone '200mg'$  daily and metoprolol XL 12.'5mg'$  daily, rate controlled  - continue Eliquis 2.'5mg'$  BID, Cr 1.6 on 7/18 + age >51, will repeat BMP today  Acute on chronic systolic and diastolic heart failure - Echo this admission showed reduced EF to 25%, down from 45% in 2021 -I&O not accurate, weight is down from 197 >174 ibs since admission  - IV lasix stopped for worsening Scr, renal index improving, will repeat BMP today  - clinically with mild dehydration, no further diuresis needed at this time given altered PO intake   - GDMT: Toprol 12.'5mg'$  daily, BP borderline, will hold off adding ARNI/ARB/MRA/SGLT2I  CAD s/p CABG - no chest pain reported - EF reduced as above - no ASA 2/2 Eliquis - PTA atorvastatin - continue Toprol - No plan for ischemic work-up as above, consider ongoing goal of care discussion with family   Pneumoperitoneum with intra-abdominal abscess  Gastric cancer  AKI on CKD III HTN HLD - per surgery/primary team     For questions or updates, please contact [facility] Please consult [email or web address] for contact info under   Signed, Margie Billet, NP  01/16/2022, 8:13 AM    As above, patient seen and examined.  Patient denies dyspnea or chest pain.  He remains euvolemic on examination and therefore we will continue to hold on diuretics.  Continue amiodarone, Toprol and apixaban for atrial fibrillation.  We have not added ARB/ARNI due to borderline pressure and renal insufficiency.  This can be reconsidered as  an outpatient.  We will follow from a distance. Kirk Ruths, MD

## 2022-01-16 NOTE — Progress Notes (Signed)
Patient HU:DJSHFW E Manganello      DOB: 11/29/1929      YOV:[id number]      Palliative Medicine Team    Subjective: Bedside symptom check completed. No family or visitors bedside at time of visit.    Physical exam: Patient resting in bed with eyes closed at time of visit. Breathing even and non-labored, no excessive secretions noted. Patient without physical or non-verbal signs of pain or discomfort at this time. Patient with mitts on his hands. Patient resting peacefully. Patient does not acknowledge this RN's presence with verbal cue. This RN did not attempt to further stimulate patient, as he had just received medication for agitation (see eMAR).    Assessment and plan: This RN touched base with bedside RN Aggie Hacker, she endorses that he has had ongoing restlessness and today, large amount of diarrhea. This RN noted that schedule medication was adjusted to start tonight to assist with agitation other than PRN options (see eMAR) and shared that the tube feeds were probably contributing to diarrhea. Anticipate stools will begin to form with oral diet and removal of feeding tube. NP Kathie Rhodes in agreement with assessment and plan. Will follow up tomorrow to check on symptom progression. Will continue to follow for any changes or advances toward planned SNF placement.    Thank you for allowing the Palliative Medicine Team to assist in the care of this patient.     Damian Leavell, MSN, RN Palliative Medicine Team Team Phone: [phone]  This phone is monitored 7a-7p, please reach out to attending physician outside of these hours for urgent needs.

## 2022-01-16 NOTE — TOC Progression Note (Signed)
Transition of Care [facility]) - Progression Note    Patient Details  Name: Dillon Smith MRN: [id number] Date of Birth: 11/29/1929  Transition of Care [facility]) CM/SW Contact  Aamina Skiff, Juliann Pulse, RN Phone Number: 01/16/2022, 1:13 PM  Clinical Narrative:  patient is not medically stable to start SNF  process-  until out of mitts/no haldol or ativan within 24hrs now that NGT is out prior stating fl2 for SNF process.    Expected Discharge Plan: [place] Barriers to Discharge: Continued Medical Work up  Expected Discharge Plan and Services Expected Discharge Plan: [place]   Discharge Planning Services: CM Consult   Living arrangements for the past 2 months: Single Family Home                                       Social Determinants of Health (SDOH) Interventions    Readmission Risk Interventions     No data to display

## 2022-01-16 NOTE — Progress Notes (Signed)
Progress Note  16 Days Post-Op  Subjective: Pt very hard of hearing. Family member at bedside this AM reports she will speak to another family member about bringing batteries for hearing aid. Patient just finished an ensure - he does not like the food here much but understands he needs to eat. Denies abdominal pain this AM or n/v. Last BM 7/16.   Objective: Vital signs in last 24 hours: Temp:  [98.1 F (36.7 C)-98.7 F (37.1 C)] 98.1 F (36.7 C) (07/19 0500) Pulse Rate:  [60-81] 81 (07/19 0500) Resp:  [18-19] 18 (07/19 0500) BP: (111-129)/(51-72) 124/72 (07/19 0500) SpO2:  [97 %-100 %] 98 % (07/19 0500) Weight:  [79.1 kg] 79.1 kg (07/19 0500) Last BM Date : 01/13/22  Intake/Output from previous day: 07/18 0701 - 07/19 0700 In: 1415 [P.O.:340; NG/GT:1075] Out: 850 [Urine:850] Intake/Output this shift: No intake/output data recorded.  PE: General: pleasant, WD male who is laying in bed in NAD Heart: regular, rate, and rhythm.  Lungs: CTAB, no wheezes, rhonchi, or rales noted.  Respiratory effort nonlabored Abd: soft, NT, ND, +BS, midline wound clean    Lab Results:  No results for input(s): "WBC", "HGB", "HCT", "PLT" in the last 72 hours. BMET Recent Labs    01/14/22 0515 01/15/22 0420  NA 140 144  K 3.7 3.8  CL 105 109  CO2 28 29  GLUCOSE 131* 123*  BUN 46* 44*  CREATININE 1.66* 1.60*  CALCIUM 8.2* 8.4*    PT/INR No results for input(s): "LABPROT", "INR" in the last 72 hours. CMP     Component Value Date/Time   NA 144 01/15/2022 0420   NA 143 02/06/2021 0845   K 3.8 01/15/2022 0420   CL 109 01/15/2022 0420   CO2 29 01/15/2022 0420   GLUCOSE 123 (H) 01/15/2022 0420   BUN 44 (H) 01/15/2022 0420   BUN 23 02/06/2021 0845   CREATININE 1.60 (H) 01/15/2022 0420   CREATININE 1.57 (H) 12/13/2021 0827   CALCIUM 8.4 (L) 01/15/2022 0420   PROT 6.9 01/11/2022 0304   PROT 6.4 02/06/2021 0845   ALBUMIN 1.8 (L) 01/11/2022 0304   ALBUMIN 3.9 02/06/2021 0845    AST 39 01/11/2022 0304   AST 18 12/13/2021 0827   ALT 42 01/11/2022 0304   ALT 17 12/13/2021 0827   ALKPHOS 72 01/11/2022 0304   BILITOT 0.5 01/11/2022 0304   BILITOT 0.5 12/13/2021 0827   GFRNONAA 41 (L) 01/15/2022 0420   GFRNONAA 42 (L) 12/13/2021 0827   GFRAA 43 (L) 07/06/2020 1156   Lipase     Component Value Date/Time   LIPASE 35 12/31/2021 0941       Studies/Results: No results found.  Anti-infectives: Anti-infectives (From admission, onward)    Start     Dose/Rate Route Frequency Ordered Stop   01/04/22 1200  piperacillin-tazobactam (ZOSYN) IVPB 3.375 g  Status:  Discontinued        3.375 g 12.5 mL/hr over 240 Minutes Intravenous Every 8 hours 01/04/22 0729 01/08/22 1116   01/02/22 1400  piperacillin-tazobactam (ZOSYN) IVPB 2.25 g  Status:  Discontinued        2.25 g 100 mL/hr over 30 Minutes Intravenous Every 8 hours 01/02/22 0718 01/04/22 0728   01/01/22 2000  vancomycin (VANCOCIN) IVPB 1000 mg/200 mL premix  Status:  Discontinued        1,000 mg 200 mL/hr over 60 Minutes Intravenous Every 24 hours 12/31/21 1820 01/01/22 0824   12/31/21 2000  piperacillin-tazobactam (ZOSYN) IVPB 3.375 g  Status:  Discontinued        3.375 g 12.5 mL/hr over 240 Minutes Intravenous Every 8 hours 12/31/21 1821 01/02/22 0718   12/31/21 1130  vancomycin (VANCOREADY) IVPB 1750 mg/350 mL        1,750 mg 175 mL/hr over 120 Minutes Intravenous  Once 12/31/21 1124 12/31/21 1411   12/31/21 1130  ceFEPIme (MAXIPIME) 2 g in sodium chloride 0.9 % 100 mL IVPB        2 g 200 mL/hr over 30 Minutes Intravenous  Once 12/31/21 1124 12/31/21 1226        Assessment/Plan Pneumoperitoneum in the setting of known gastric cancer on chemotherapy POD #16 status post ex lap drainage of intra-abdominal abscess 7/3 Dr. Marlou Starks  - continue wound care - healing well  - drains out - cortrak out, recommend leaving out and encouraging PO intake, will reach out to SLP to see if they could recommend things  that family could bring for him if he would prefer that food - not a candidate for placement of PEG/gastrostomy/j-tube given known gastric cancer with recent perforation  - cardiology was following for diuresis but has signed off - [facility] assuming primary care for this patient but we will continue to follow  - palliative following for GOC as well - at this point patient is surgically stable and this is really more of a placement issue - TOC working on this  - continue therapies   FEN: D1 diet, ensure VTE: eliquis BID ID: currently off abx  - below per TR[facility] -  CKD stage III Hx of CVA A. Fib on eliquis HTN HLD CAD Hx of prostate cancer    LOS: 16 days     Norm Parcel, [facility] Surgery 01/16/2022, 10:54 AM Please see Amion for pager number during day hours 7:00am-4:30pm

## 2022-01-16 NOTE — Progress Notes (Signed)
   01/16/22 1307  Provider Notification  Provider Name/Title Bonnielee Haff, MD  Date Provider Notified 01/16/22  Time Provider Notified 1307  Method of Notification Page  Notification Reason Other (Comment) (A-fib, HR ranging from 110s-150s)  Date Critical Result Received 01/16/22  Time Critical Result Received 1307  Provider response Other (Comment) (Advised to give haldol for agitation)  Date of Provider Response 01/16/22  Time of Provider Response 1308   Patient given Haldol for agitation. Agitated suspected to have increased heart rate. After haldol given, patient HR maintaining between 80s and 100s, in A-fib. Patient currently resting, and asleep.  Layla Maw, RN

## 2022-01-16 NOTE — Progress Notes (Signed)
OT Cancellation Note  Patient Details Name: MAURO ARPS MRN: [id number] DOB: 11/29/1929   Cancelled Treatment:    Reason Eval/Treat Not Completed: Other (comment) Patient currently having constant watery diarrhea. Will hold for now.   Winta Barcelo L Jakyron Fabro 01/16/2022, 1:20 PM

## 2022-01-16 NOTE — Progress Notes (Signed)
Patient pulled out panda tube. Consulted with general surgery. MD advised to keep panda tube removed.  Layla Maw, RN

## 2022-01-17 DIAGNOSIS — I4819 Other persistent atrial fibrillation: Secondary | ICD-10-CM | POA: Diagnosis not present

## 2022-01-17 DIAGNOSIS — D649 Anemia, unspecified: Secondary | ICD-10-CM | POA: Diagnosis not present

## 2022-01-17 DIAGNOSIS — N1831 Chronic kidney disease, stage 3a: Secondary | ICD-10-CM | POA: Diagnosis not present

## 2022-01-17 DIAGNOSIS — K255 Chronic or unspecified gastric ulcer with perforation: Secondary | ICD-10-CM | POA: Diagnosis not present

## 2022-01-17 LAB — COMPREHENSIVE METABOLIC PANEL
ALT: 28 U/L (ref 0–44)
AST: 20 U/L (ref 15–41)
Albumin: 1.8 g/dL — ABNORMAL LOW (ref 3.5–5.0)
Alkaline Phosphatase: 65 U/L (ref 38–126)
Anion gap: 7 (ref 5–15)
BUN: 32 mg/dL — ABNORMAL HIGH (ref 8–23)
CO2: 26 mmol/L (ref 22–32)
Calcium: 8.4 mg/dL — ABNORMAL LOW (ref 8.9–10.3)
Chloride: 112 mmol/L — ABNORMAL HIGH (ref 98–111)
Creatinine, Ser: 1.29 mg/dL — ABNORMAL HIGH (ref 0.61–1.24)
GFR, Estimated: 53 mL/min — ABNORMAL LOW (ref >60)
Glucose, Bld: 91 mg/dL (ref 70–99)
Potassium: 3.8 mmol/L (ref 3.5–5.1)
Sodium: 145 mmol/L (ref 135–145)
Total Bilirubin: 0.4 mg/dL (ref 0.3–1.2)
Total Protein: 6.9 g/dL (ref 6.5–8.1)

## 2022-01-17 LAB — CBC
HCT: 26.2 % — ABNORMAL LOW (ref 39.0–52.0)
Hemoglobin: 8.4 g/dL — ABNORMAL LOW (ref 13.0–17.0)
MCH: 30.5 pg (ref 26.0–34.0)
MCHC: 32.1 g/dL (ref 30.0–36.0)
MCV: 95.3 fL (ref 80.0–100.0)
Platelets: 387 10*3/uL (ref 150–400)
RBC: 2.75 MIL/uL — ABNORMAL LOW (ref 4.22–5.81)
RDW: 15.9 % — ABNORMAL HIGH (ref 11.5–15.5)
WBC: 10.6 10*3/uL — ABNORMAL HIGH (ref 4.0–10.5)
nRBC: 0 % (ref 0.0–0.2)

## 2022-01-17 LAB — GLUCOSE, CAPILLARY
Glucose-Capillary: 63 mg/dL — ABNORMAL LOW (ref 70–99)
Glucose-Capillary: 70 mg/dL (ref 70–99)
Glucose-Capillary: 120 mg/dL — ABNORMAL HIGH (ref 70–99)
Glucose-Capillary: 101 mg/dL — ABNORMAL HIGH (ref 70–99)

## 2022-01-17 MED ORDER — APIXABAN 5 MG PO TABS
5.0000 mg | ORAL_TABLET | Freq: Two times a day (BID) | ORAL | Status: DC
  Administered 2022-01-17 – 2022-01-22 (×11): 5 mg via ORAL
  Filled 2022-01-17 (×11): qty 1

## 2022-01-17 MED ORDER — QUETIAPINE FUMARATE 25 MG PO TABS
50.0000 mg | ORAL_TABLET | Freq: Every day | ORAL | Status: DC
  Administered 2022-01-17 – 2022-01-21 (×5): 50 mg via ORAL
  Filled 2022-01-17 (×5): qty 2

## 2022-01-17 NOTE — Progress Notes (Signed)
Occupational Therapy Treatment Patient Details Name: Dillon Smith MRN: [id number] DOB: 11/29/1929 Today's Date: 01/17/2022   History of present illness 88 yo male admitted with gastric perforation. S/P ex lap, drainage of abscess. Hx of gastric ca, ckd, bradycardia, covid, prostate ca, cva, afib.   OT comments  Treatment focused on improving strength and activity tolerance via functional mobility and performing ADL task at edge of bed. Patient able to performing grooming and UB task at edge of bed but fatigued quickly and began to prop himself with his elbows on his knees in a bent over position. Patient still needing max assist to transfer to edge of bed but less assistance to stand (min x 2 ) and take steps to recliner - though his knees looked like they wanted to buckle. Patient left in recliner with chair alarm and mittens.     Recommendations for follow up therapy are one component of a multi-disciplinary discharge planning process, led by the attending physician.  Recommendations may be updated based on patient status, additional functional criteria and insurance authorization.    Follow Up Recommendations  Skilled nursing-short term rehab (<3 hours/day)    Assistance Recommended at Discharge Frequent or constant Supervision/Assistance  Patient can return home with the following  Two people to help with walking and/or transfers;Two people to help with bathing/dressing/bathroom;Assistance with feeding;A lot of help with bathing/dressing/bathroom;Direct supervision/assist for financial management;Assistance with cooking/housework;Assist for transportation;Help with stairs or ramp for entrance;Direct supervision/assist for medications management   Equipment Recommendations  Other (comment) (TBD)    Recommendations for Other Services      Precautions / Restrictions Precautions Precautions: Fall Precaution Comments: PICC, ABD incision, tele, PremoFit, has back pain and tends to slide  down in chair (fall risk from chair) Restrictions Weight Bearing Restrictions: No       Mobility Bed Mobility Overal bed mobility: Needs Assistance Bed Mobility: Supine to Sit     Supine to sit: Max assist, HOB elevated, +2 for safety/equipment     General bed mobility comments: Max assist to transfer into sitting but did attempt to assist with LEs    Transfers Overall transfer level: Needs assistance Equipment used: Rolling walker (2 wheels) Transfers: Sit to/from Stand, Bed to chair/wheelchair/BSC Sit to Stand: Min assist, +2 physical assistance, +2 safety/equipment, From elevated surface           General transfer comment: Min assist x 2 to take steps to recilner. Recliner needed to be pulled closer as knees appear to want to buckle but able to make it a couple of steps with patient holding on to walker.     Balance Overall balance assessment: Needs assistance Sitting-balance support: No upper extremity supported, Feet supported Sitting balance-Leahy Scale: Fair     Standing balance support: During functional activity, Reliant on assistive device for balance Standing balance-Leahy Scale: Poor                             ADL either performed or assessed with clinical judgement   ADL Overall ADL's : Needs assistance/impaired Eating/Feeding: Sitting;Set up Eating/Feeding Details (indicate cue type and reason): able to drink from cup sitting in chair Grooming: Wash/dry face;Sitting Grooming Details (indicate cue type and reason): able to wash face at edge of bed Upper Body Bathing: Moderate assistance;Sitting Upper Body Bathing Details (indicate cue type and reason): Performed partial bathing task at edge of bed. Then applied lotion to arms.  Functional mobility during ADLs: Minimal assistance;+2 for safety/equipment      Extremity/Trunk Assessment Upper Extremity Assessment Upper Extremity Assessment: Generalized  weakness   Lower Extremity Assessment Lower Extremity Assessment: Generalized weakness   Cervical / Trunk Assessment Cervical / Trunk Assessment: Kyphotic    Vision Patient Visual Report: No change from baseline     Perception     Praxis      Cognition Arousal/Alertness: Awake/alert Behavior During Therapy: WFL for tasks assessed/performed Overall Cognitive Status: Impaired/Different from baseline                                 General Comments: Able to follow commands. Alert to self, hospital and knows he has had stomach issues but unaware of recent surgery. Very HOH        Exercises      Shoulder Instructions       General Comments      Pertinent Vitals/ Pain       Pain Assessment Pain Assessment: Faces Faces Pain Scale: Hurts little more Breathing: normal Negative Vocalization: none Facial Expression: sad, frightened, frown Body Language: tense, distressed pacing, fidgeting Consolability: no need to console PAINAD Score: 2 Pain Location: back Pain Descriptors / Indicators: Grimacing, Restless Pain Intervention(s): Monitored during session  Home Living                                          Prior Functioning/Environment              Frequency  Min 2X/week        Progress Toward Goals  OT Goals(current goals can now be found in the care plan section)  Progress towards OT goals: Progressing toward goals  Acute Rehab OT Goals OT Goal Formulation: Patient unable to participate in goal setting Time For Goal Achievement: 01/23/22 Potential to Achieve Goals: [place] Discharge plan remains appropriate    Co-evaluation                 AM-PAC OT "6 Clicks" Daily Activity     Outcome Measure   Help from another person eating meals?: A Little Help from another person taking care of personal grooming?: A Little Help from another person toileting, which includes using toliet, bedpan, or urinal?:  Total Help from another person bathing (including washing, rinsing, drying)?: A Lot Help from another person to put on and taking off regular upper body clothing?: A Lot Help from another person to put on and taking off regular lower body clothing?: Total 6 Click Score: 12    End of Session Equipment Utilized During Treatment: Rolling walker (2 wheels)  OT Visit Diagnosis: Unsteadiness on feet (R26.81);Other abnormalities of gait and mobility (R26.89)   Activity Tolerance Patient limited by fatigue   Patient Left in chair;with call bell/phone within reach;with chair alarm set;Other (comment) (floor mat in place, assisted with elevated feet in recliner via chair)   Nurse Communication Mobility status        Time: 6720-9470 OT Time Calculation (min): 34 min  Charges: OT General Charges $OT Visit: 1 Visit OT Treatments $Self Care/Home Management : 8-22 mins $Therapeutic Activity: 8-22 mins  Khaliyah Northrop, OTR/L [place]  Office [phone] Pager: [place] 01/17/2022, 11:22 AM

## 2022-01-17 NOTE — Progress Notes (Signed)
[facility] PROGRESS NOTE   ATA PECHA TKZ:[id number] DOB: 11/29/1929 DOA: 12/31/2021  PCP: [name]  Brief History/Interval Summary: 88 y.o. male past medical history of chronic kidney disease stage III, paroxysmal atrial fibrillation on anticoagulation at home, retention history of gastric cancer was admitted by the surgical service as work-up revealed pneumoperitoneum due to gastric perforation. Was taken to the OR and is status post exploratory laparotomy and drainage of intra-abdominal abscess on 7/3.  Initially tried hospitalist was consulted for medical management.  Patient has stabilized.  General surgery requesting hospitalist to take over care.    Consultants: General surgery.  Cardiology.  Palliative care.  Previously seen by critical care medicine.  Procedures: Exploratory laparotomy with drainage of intra-abdominal abscess 7/3    Subjective/Interval History: Discussed with nursing staff.  Apparently patient was agitated overnight.  Trying to pull on things.  Had to keep the mittens on.  Seems to be pleasant this morning.  Denies any pain issues.     Assessment/Plan:  Pneumoperitoneum in the setting of gastric cancer perforation on chemotherapy: Status post surgical intervention with exploratory laparotomy and intra-abdominal abscesses on 7/3. General surgery has been following.  Patient pulled his core track feeding tube out on 7/19.  Currently on a dysphagia 1 diet.  No plans for reinsertion of feeding tube at this time.  Not a candidate for PEG tube due to his history of gastric cancer.  Palliative care is following. Pain seems to be adequately controlled.  He is noted to be on fentanyl patch.  Medical oncology has been following.  Looks like patient was receiving neoadjuvant chemotherapy for his gastric cancer which he tolerated well.  Chemotherapy currently on hold.  Medical oncology to reevaluate treatment options in the outpatient setting.   Followed by [name]. Has had multiple loose stools likely due to combination of tube feedings as well as resumption of bowel function.  Continue to monitor for now.  If it persist or tomorrow we can give him Imodium as needed.  Acute kidney injury on chronic kidney disease stage IIIa: Looks like baseline creatinine is between 1.3-1.7.  Presented with creatinine greater than 2.  Renal function has stabilized.  Continue to monitor urine output.     Acute on chronic systolic and diastolic CHF EF known to be 25% based on echo done during this hospitalization.  Down from 45% in 2021. Cardiology has been following.  Patient noted to be on metoprolol.  Diuretics on hold for now.  Due to borderline blood pressure patient is not on ARB or ACE inhibitor.  Elevated creatinine also precludes this for now. Cardiac status is stable.  Remains off of diuretics at this time.  Acute metabolic encephalopathy/hospital delirium Has been noted to be agitated at times.  Does require mittens because he tends to pull on his dressing and IV.  Haldol seems to be helping.  Started on Seroquel last night.  Will increase the dose starting tonight.  Hold off on adding a daytime dose at this time.  Will reevaluate tomorrow.  Persistent atrial fibrillation Cardiology has been managing.  Noted to be on amiodarone and metoprolol.  Also on apixaban for anticoagulation.  Essential hypertension: Blood pressure is reasonably well controlled.   Hyperlipidemia: Should be able to resume statin at discharge  Hyperglycemia No diabetic medications noted on home medication list.  Elevated CBGs were likely due to tube feedings.   HbA1c is pending.  Low glucose levels noted this morning.  Encourage oral  intake.     Normocytic anemia Likely anemia of chronic disease.  No evidence of overt blood loss.  Mild decrease in hemoglobin is noted.  No overt bleeding appreciated.  Continue to monitor.  Goals of care/ethics: Palliative care  following.  Patient is noted to be DNR.  Prognosis is guarded.    DVT Prophylaxis: On apixaban Code Status: DNR Family Communication: No family at bedside.  We will try to reach out to family today. Disposition Plan: Skilled nursing facility is recommended  Status is: Inpatient Remains inpatient appropriate because: Pneumoperitoneum, gastric cancer, poor oral intake      Medications: Scheduled:  amiodarone  200 mg Oral Daily   apixaban  5 mg Oral BID   Chlorhexidine Gluconate Cloth  6 each Topical Daily   diphenhydrAMINE  25 mg Oral QHS   feeding supplement  237 mL Oral TID WC   fentaNYL  1 patch Transdermal Q72H   metoprolol succinate  12.5 mg Oral Daily   mouth rinse  15 mL Mouth Rinse 4 times per day   pantoprazole  40 mg Oral QHS   QUEtiapine  25 mg Oral QHS   sodium chloride flush  10-40 mL Intracatheter Q12H   thiamine  100 mg Oral Daily   Continuous:  sodium chloride Stopped (01/04/22 1748)   QPY:PPJKDT chloride, bisacodyl, haloperidol lactate, heparin lock flush, HYDROmorphone (DILAUDID) injection, HYDROmorphone HCl, lip balm, ondansetron **OR** ondansetron (ZOFRAN) IV, mouth rinse, mouth rinse, phenol, sodium chloride flush  Antibiotics: Anti-infectives (From admission, onward)    Start     Dose/Rate Route Frequency Ordered Stop   01/04/22 1200  piperacillin-tazobactam (ZOSYN) IVPB 3.375 g  Status:  Discontinued        3.375 g 12.5 mL/hr over 240 Minutes Intravenous Every 8 hours 01/04/22 0729 01/08/22 1116   01/02/22 1400  piperacillin-tazobactam (ZOSYN) IVPB 2.25 g  Status:  Discontinued        2.25 g 100 mL/hr over 30 Minutes Intravenous Every 8 hours 01/02/22 0718 01/04/22 0728   01/01/22 2000  vancomycin (VANCOCIN) IVPB 1000 mg/200 mL premix  Status:  Discontinued        1,000 mg 200 mL/hr over 60 Minutes Intravenous Every 24 hours 12/31/21 1820 01/01/22 0824   12/31/21 2000  piperacillin-tazobactam (ZOSYN) IVPB 3.375 g  Status:  Discontinued        3.375  g 12.5 mL/hr over 240 Minutes Intravenous Every 8 hours 12/31/21 1821 01/02/22 0718   12/31/21 1130  vancomycin (VANCOREADY) IVPB 1750 mg/350 mL        1,750 mg 175 mL/hr over 120 Minutes Intravenous  Once 12/31/21 1124 12/31/21 1411   12/31/21 1130  ceFEPIme (MAXIPIME) 2 g in sodium chloride 0.9 % 100 mL IVPB        2 g 200 mL/hr over 30 Minutes Intravenous  Once 12/31/21 1124 12/31/21 1226       Objective:  Vital Signs  Vitals:   01/16/22 1800 01/16/22 2134 01/17/22 0410 01/17/22 0413  BP: 127/60 (!) 132/50 119/61   Pulse: 75 81 83   Resp: '18 20 17   '$ Temp: 98.1 F (36.7 C) 98.7 F (37.1 C) 98.1 F (36.7 C)   TempSrc: Oral Oral Oral   SpO2: 100% 100% 94%   Weight:    78.9 kg  Height:        Intake/Output Summary (Last 24 hours) at 01/17/2022 0948 Last data filed at 01/17/2022 0903 Gross per 24 hour  Intake 1920 ml  Output 1000 ml  Net 920 ml    Filed Weights   01/15/22 0500 01/16/22 0500 01/17/22 0413  Weight: 78.8 kg 79.1 kg 78.9 kg    General appearance: Awake alert.  In no distress.  Mildly distracted Resp: Clear to auscultation bilaterally.  Normal effort Cardio: S1-S2 is normal regular.  No S3-S4.  No rubs murmurs or bruit GI: Abdomen is soft.  Dressing is noted. Extremities: No edema.  Full range of motion of lower extremities. Neurologic: Was able to tell me that he was at [facility]  Was able to tell me the year and the month.  No obvious focal neurological deficits.   Lab Results:  Data Reviewed: I have personally reviewed following labs and reports of the imaging studies  CBC: Recent Labs  Lab 01/11/22 0304 01/12/22 0344 01/17/22 0401  WBC 5.4 5.4 10.6*  HGB 9.1* 8.8* 8.4*  HCT 27.7* 26.4* 26.2*  MCV 92.0 90.4 95.3  PLT 233 260 387     Basic Metabolic Panel: Recent Labs  Lab 01/11/22 0304 01/12/22 0344 01/13/22 0504 01/14/22 0515 01/15/22 0420 01/17/22 0401  NA 142 141 140 140 144 145  K 4.1 4.0 3.8 3.7 3.8 3.8  CL  109 105 105 105 109 112*  CO2 '27 28 29 28 29 26  '$ GLUCOSE 137* 122* 90 131* 123* 91  BUN 42* 47* 49* 46* 44* 32*  CREATININE 1.45* 1.72* 1.70* 1.66* 1.60* 1.29*  CALCIUM 8.5* 8.2* 8.1* 8.2* 8.4* 8.4*  MG 2.0  --   --   --   --   --   PHOS 4.0  --   --   --   --   --      GFR: Estimated Creatinine Clearance: 40.5 mL/min (A) (by C-G formula based on SCr of 1.29 mg/dL (H)).  Liver Function Tests: Recent Labs  Lab 01/11/22 0304 01/17/22 0401  AST 39 20  ALT 42 28  ALKPHOS 72 65  BILITOT 0.5 0.4  PROT 6.9 6.9  ALBUMIN 1.8* 1.8*       CBG: Recent Labs  Lab 01/16/22 0730 01/16/22 1203 01/16/22 1604 01/17/22 0749 01/17/22 0820  GLUCAP 120* 143* 91 63* 70       Radiology Studies: No results found.     LOS: 17 days   Yarel Rushlow [employer] on [email or web address]  01/17/2022, 9:48 AM

## 2022-01-17 NOTE — TOC Progression Note (Signed)
Transition of Care [facility]) - Progression Note    Patient Details  Name: Dillon Smith MRN: [id number] Date of Birth: 11/29/1929  [facility] CM/SW Contact  Moria Brophy, Juliann Pulse, RN Phone Number: 01/17/2022, 12:21 PM  Clinical Narrative: Noted loose BM, mitts in place. Continue to monitor for appropriateness for fl2 initiation.      Expected Discharge Plan: [place] Barriers to Discharge: Continued Medical Work up  Expected Discharge Plan and Services Expected Discharge Plan: [place]   Discharge Planning Services: CM Consult   Living arrangements for the past 2 months: Single Family Home                                       Social Determinants of Health (SDOH) Interventions    Readmission Risk Interventions     No data to display

## 2022-01-17 NOTE — Progress Notes (Signed)
Speech Language Pathology Treatment: Dysphagia  Patient Details Name: Dillon Smith MRN: [id number] DOB: 11/29/1929 Today's Date: 01/17/2022 Time: 1638-4665 SLP Time Calculation (min) (ACUTE ONLY): 19 min  Assessment / Plan / Recommendation Clinical Impression  Pt seen for skilled SLP to determine readiness for dietary advancement, tolerance of po intake.  RN, who has worked with pt earlier in hospital coarse, reports pt with improved mentation, speech and swallowing today.  Note his intake listed as 25-50%.  Pt willing to consume solids and liquids with this SLP.  He does tend to slide down in the bed but with repositioning was able to maintain position for entirety of session.  Lower dentures in place - upper right dentition intact but no dentition on left upper.  Observed pt with intake including moistened graham cracker, Ensure, icecream and water.  He demonstrated adequate mastication and oral clearance when boluses placed on right oral cavity - no significant oral pocketing and ice cream administration aided to clear trace retention.  Pt's swallow was timely - frequent throat clearing noted after liquid swallows - - which pt did not recall if is baseline or new.  Given he is afebrile and last CXR was 01/04/2022, and his voice is clear - he is clinically judged to be tolerating po intake. WBC with increase to 10.5 01/17/2022 from 5.4 on 01/12/2022.  Recommend advance to dys2/thin diet to maximize po intake.  RN informed and agreeable.     HPI HPI: 88yo male admitted 12/31/21 with abdominal pain. PMH: HTN, prostate cancer, HLD, AFib, CKD3, CVA, CAD s/p CABG, current gastric adenocarcinoma undergoing chemotherapy.      SLP Plan  Continue with current plan of care      Recommendations for follow up therapy are one component of a multi-disciplinary discharge planning process, led by the attending physician.  Recommendations may be updated based on patient status, additional functional criteria and  insurance authorization.    Recommendations  Diet recommendations: Dysphagia 2 (fine chop);Thin liquid Liquids provided via: Cup;Straw Medication Administration: Whole meds with puree Supervision: Staff to assist with self feeding;Full supervision/cueing for compensatory strategies Compensations: Minimize environmental distractions;Slow rate;Small sips/bites;Lingual sweep for clearance of pocketing Postural Changes and/or Swallow Maneuvers: Seated upright 90 degrees;Upright 30-60 min after meal (partially upright after meals)                Oral Care Recommendations: Oral care BID Follow Up Recommendations: Skilled nursing-short term rehab (<3 hours/day) Assistance recommended at discharge: Frequent or constant Supervision/Assistance SLP Visit Diagnosis: Dysphagia, unspecified (R13.10) Plan: Continue with current plan of care          Kathleen Lime, MS [place] Office [phone] Pager [phone]  [name]  01/17/2022, 4:42 PM

## 2022-01-17 NOTE — Discharge Instructions (Signed)
[facility], [place] [phone]  OPEN ABDOMINAL SURGERY: POST OP INSTRUCTIONS  Always review your discharge instruction sheet given to you by the facility where your surgery was performed.  IF YOU HAVE DISABILITY OR FAMILY LEAVE FORMS, YOU MUST BRING THEM TO THE OFFICE FOR PROCESSING.  PLEASE DO NOT GIVE THEM TO YOUR DOCTOR.  A prescription for pain medication may be given to you upon discharge.  Take your pain medication as prescribed, if needed.  If narcotic pain medicine is not needed, then you may take acetaminophen (Tylenol) or ibuprofen (Advil) as needed. Take your usually prescribed medications unless otherwise directed. If you need a refill on your pain medication, please contact your pharmacy. They will contact our office to request authorization.  Prescriptions will not be filled after 5pm or on week-ends. You should follow a light diet the first few days after arrival home, such as soup and crackers, pudding, etc.unless your doctor has advised otherwise. A high-fiber, low fat diet can be resumed as tolerated.   Be sure to include lots of fluids daily. Most patients will experience some swelling and bruising on the chest and neck area.  Ice packs will help.  Swelling and bruising can take several days to resolve Most patients will experience some swelling and bruising in the area of the incision. Ice pack will help. Swelling and bruising can take several days to resolve..  It is common to experience some constipation if taking pain medication after surgery.  Increasing fluid intake and taking a stool softener will usually help or prevent this problem from occurring.  A mild laxative (Milk of Magnesia or Miralax) should be taken according to package directions if there are no bowel movements after 48 hours.  You may have steri-strips (small skin tapes) in place directly over the incision.  These strips should be left on the skin for 7-10 days.  If your surgeon used skin  glue on the incision, you may shower in 24 hours.  The glue will flake off over the next 2-3 weeks.  Any sutures or staples will be removed at the office during your follow-up visit. You may find that a light gauze bandage over your incision may keep your staples from being rubbed or pulled. You may shower and replace the bandage daily. ACTIVITIES:  You may resume regular (light) daily activities beginning the next day--such as daily self-care, walking, climbing stairs--gradually increasing activities as tolerated.  You may have sexual intercourse when it is comfortable.  Refrain from any heavy lifting or straining until approved by your doctor. You may drive when you no longer are taking prescription pain medication, you can comfortably wear a seatbelt, and you can safely maneuver your car and apply brakes  You should see your doctor in the office for a follow-up appointment approximately two weeks after your surgery.  Make sure that you call for this appointment within a day or two after you arrive home to insure a convenient appointment time.  WHEN TO CALL YOUR DOCTOR: Fever over 101.0 Inability to urinate Nausea and/or vomiting Extreme swelling or bruising Continued bleeding from incision. Increased pain, redness, or drainage from the incision. Difficulty swallowing or breathing Muscle cramping or spasms. Numbness or tingling in hands or feet or around lips.  The clinic staff is available to answer your questions during regular business hours.  Please don't hesitate to call and ask to speak to one of the nurses if you have concerns.  For  further questions, please visit [email or web address]   WOUND CARE: - dressing to be changed daily - supplies: sterile saline, kerlix/guaze, scissors, ABD pads, tape  - remove dressing and all packing carefully, moistening with sterile saline as needed to avoid packing/internal dressing sticking to the wound. - clean edges of skin around the  wound with water/gauze, making sure there is no tape debris or leakage left on skin that could cause skin irritation or breakdown. - dampen clean kerlix/gauze with sterile saline and pack wound from wound base to skin level, making sure to take note of any possible areas of wound tracking, tunneling and packing appropriately. Wound can be packed loosely. Trim kerlix/gauze to size if a whole roll/piece is not required. - cover wound with a dry ABD pad and secure with tape.  - write the date/time on the dry dressing/tape to better track when the last dressing change occurred. - apply any skin protectant/powder recommended by clinician to protect skin/skin folds. - change dressing as needed if leakage occurs, wound gets contaminated, or patient requests to shower. - patient may shower daily with wound open and following the shower the wound should be dried and a clean dressing placed.

## 2022-01-17 NOTE — Care Management Important Message (Signed)
Important Message  Patient Details IM Letter placed in Patients room. Name: Dillon Smith MRN: [id number] Date of Birth: 11/29/1929   Medicare Important Message Given:  Yes     Kerin Salen 01/17/2022, 3:04 PM

## 2022-01-17 NOTE — Progress Notes (Signed)
17 Days Post-Op   Subjective/Chief Complaint: Did better yesterday with po intake Denies abdominal pain Having loose BM's Still requiring restraints at times for safety at night   Objective: Vital signs in last 24 hours: Temp:  [98.1 F (36.7 C)-98.7 F (37.1 C)] 98.1 F (36.7 C) (07/20 0410) Pulse Rate:  [75-83] 83 (07/20 0410) Resp:  [17-20] 17 (07/20 0410) BP: (119-132)/(50-61) 119/61 (07/20 0410) SpO2:  [94 %-100 %] 94 % (07/20 0410) Weight:  [78.9 kg] 78.9 kg (07/20 0413) Last BM Date : 01/16/22  Intake/Output from previous day: 07/19 0701 - 07/20 0700 In: 1680 [P.O.:1680] Out: 1000 [Urine:1000] Intake/Output this shift: No intake/output data recorded.  Exam: Awake and alert Follow commands Abdomen soft, NT/ND, open midline wound healing well  Lab Results:  Recent Labs    01/17/22 0401  WBC 10.6*  HGB 8.4*  HCT 26.2*  PLT 387   BMET Recent Labs    01/15/22 0420 01/17/22 0401  NA 144 145  K 3.8 3.8  CL 109 112*  CO2 29 26  GLUCOSE 123* 91  BUN 44* 32*  CREATININE 1.60* 1.29*  CALCIUM 8.4* 8.4*   PT/INR No results for input(s): "LABPROT", "INR" in the last 72 hours. ABG No results for input(s): "PHART", "HCO3" in the last 72 hours.  Invalid input(s): "PCO2", "PO2"  Studies/Results: No results found.  Anti-infectives: Anti-infectives (From admission, onward)    Start     Dose/Rate Route Frequency Ordered Stop   01/04/22 1200  piperacillin-tazobactam (ZOSYN) IVPB 3.375 g  Status:  Discontinued        3.375 g 12.5 mL/hr over 240 Minutes Intravenous Every 8 hours 01/04/22 0729 01/08/22 1116   01/02/22 1400  piperacillin-tazobactam (ZOSYN) IVPB 2.25 g  Status:  Discontinued        2.25 g 100 mL/hr over 30 Minutes Intravenous Every 8 hours 01/02/22 0718 01/04/22 0728   01/01/22 2000  vancomycin (VANCOCIN) IVPB 1000 mg/200 mL premix  Status:  Discontinued        1,000 mg 200 mL/hr over 60 Minutes Intravenous Every 24 hours 12/31/21 1820  01/01/22 0824   12/31/21 2000  piperacillin-tazobactam (ZOSYN) IVPB 3.375 g  Status:  Discontinued        3.375 g 12.5 mL/hr over 240 Minutes Intravenous Every 8 hours 12/31/21 1821 01/02/22 0718   12/31/21 1130  vancomycin (VANCOREADY) IVPB 1750 mg/350 mL        1,750 mg 175 mL/hr over 120 Minutes Intravenous  Once 12/31/21 1124 12/31/21 1411   12/31/21 1130  ceFEPIme (MAXIPIME) 2 g in sodium chloride 0.9 % 100 mL IVPB        2 g 200 mL/hr over 30 Minutes Intravenous  Once 12/31/21 1124 12/31/21 1226       Assessment/Plan: Pneumoperitoneum in the setting of known gastric cancer on chemotherapy POD #17 status post ex lap drainage of intra-abdominal abscess 7/3 Dr. Marlou Starks   Wound healing well No plans for treatment/surgery regarding gastric cancer currently Appreciate [facility] taking over the patient's care Issue currently is disposition   [name] MD 01/17/2022

## 2022-01-17 NOTE — Progress Notes (Addendum)
Patient NL:[id number] E Yarbro      DOB: 11/29/1929      ERD:[id number]      Palliative Medicine Team    Subjective: Bedside symptom check completed. No family or visitors present at my time of visit.    Physical exam: Patient resting in bed with eyes closed at time of visit. Breathing even and non-labored, no excessive secretions noted. Patient without physical or non-verbal signs of pain or discomfort at this time.    Assessment and plan: This RN touched base with bedside NT, who endorses that the patient has been eating 25-50% of each meal, more interactive throughout day, sat up in the recliner for a while earlier today, visited with family/friends. Bedside RN endorses two BMs today, improved from yesterday. She reports no other concerns or needs today. Will continue to follow for any changes or advances.    Thank you for allowing the Palliative Medicine Team to assist in the care of this patient.     Damian Leavell, MSN, RN Palliative Medicine Team Team Phone: [phone]  This phone is monitored 7a-7p, please reach out to attending physician outside of these hours for urgent needs.

## 2022-01-18 DIAGNOSIS — I4819 Other persistent atrial fibrillation: Secondary | ICD-10-CM | POA: Diagnosis not present

## 2022-01-18 DIAGNOSIS — D649 Anemia, unspecified: Secondary | ICD-10-CM | POA: Diagnosis not present

## 2022-01-18 DIAGNOSIS — N1831 Chronic kidney disease, stage 3a: Secondary | ICD-10-CM | POA: Diagnosis not present

## 2022-01-18 LAB — BASIC METABOLIC PANEL
Anion gap: 6 (ref 5–15)
BUN: 28 mg/dL — ABNORMAL HIGH (ref 8–23)
CO2: 26 mmol/L (ref 22–32)
Calcium: 8.4 mg/dL — ABNORMAL LOW (ref 8.9–10.3)
Chloride: 109 mmol/L (ref 98–111)
Creatinine, Ser: 1.31 mg/dL — ABNORMAL HIGH (ref 0.61–1.24)
GFR, Estimated: 52 mL/min — ABNORMAL LOW (ref >60)
Glucose, Bld: 83 mg/dL (ref 70–99)
Potassium: 3.9 mmol/L (ref 3.5–5.1)
Sodium: 141 mmol/L (ref 135–145)

## 2022-01-18 LAB — CBC
HCT: 25.5 % — ABNORMAL LOW (ref 39.0–52.0)
Hemoglobin: 8.2 g/dL — ABNORMAL LOW (ref 13.0–17.0)
MCH: 30.4 pg (ref 26.0–34.0)
MCHC: 32.2 g/dL (ref 30.0–36.0)
MCV: 94.4 fL (ref 80.0–100.0)
Platelets: 389 10*3/uL (ref 150–400)
RBC: 2.7 MIL/uL — ABNORMAL LOW (ref 4.22–5.81)
RDW: 15.9 % — ABNORMAL HIGH (ref 11.5–15.5)
WBC: 13.7 10*3/uL — ABNORMAL HIGH (ref 4.0–10.5)
nRBC: 0 % (ref 0.0–0.2)

## 2022-01-18 LAB — GLUCOSE, CAPILLARY
Glucose-Capillary: 78 mg/dL (ref 70–99)
Glucose-Capillary: 100 mg/dL — ABNORMAL HIGH (ref 70–99)
Glucose-Capillary: 93 mg/dL (ref 70–99)
Glucose-Capillary: 83 mg/dL (ref 70–99)

## 2022-01-18 NOTE — NC FL2 (Signed)
[place] MEDICAID FL2 LEVEL OF CARE SCREENING TOOL     IDENTIFICATION  Patient Name: Dillon Smith Birthdate: 04-08-1930 Sex: male Admission Date (Current Location): 12/31/2021  [facility] and [place] Number:  [name],  [name], [name]  Attending Physician Name and Address:  Bonnielee Haff, MD  Relative Name and Phone Number:   Reine Just dtr [phone])    Current Level of Care: Hospital Recommended Level of Care: Skilled Nursing Facility Prior Approval Number:    Date Approved/Denied:   PASRR Number:  [id number])  Discharge Plan: SNF    Current Diagnoses: Patient Active Problem List   Diagnosis Date Noted   Acute on chronic systolic heart failure ([place]) 01/08/2022   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[place]) 12/31/2021   Port-A-Cath in place 12/13/2021   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/29/2021   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 08/19/2019   Symptomatic bradycardia 08/19/2019   Bradycardia 08/18/2019   COVID-19 virus infection 08/17/2019   Atrial fibrillation ([place]) 08/17/2019   Hyperlipidemia 08/17/2019   Stage 3a chronic kidney disease (CKD) ([place]) 08/17/2019   Acute ischemic stroke (HCC) mult R MCA and PCA embolic infarcts d/t AF not on AC s/p tPA 08/15/2019   CARCINOMA, PROSTATE 09/15/2007   Essential hypertension 09/15/2007   Coronary artery disease 09/15/2007   HEMORRHOIDS, INTERNAL 09/15/2007   RADIATION PROCTITIS 09/15/2007    Orientation RESPIRATION BLADDER Height & Weight     Self, Time, Situation, Place  Normal Incontinent Weight: 81.8 kg Height:  '5\' 11"'$  (180.3 cm)  BEHAVIORAL SYMPTOMS/MOOD NEUROLOGICAL BOWEL NUTRITION STATUS      Incontinent Diet (dysphagia 2 thin)  AMBULATORY STATUS COMMUNICATION OF NEEDS Skin   Limited Assist Verbally Surgical wounds (mid abdomen)                       Personal Care Assistance Level  of Assistance  Bathing, Feeding, Dressing Bathing Assistance: Limited assistance Feeding assistance: Limited assistance Dressing Assistance: Limited assistance     Functional Limitations Info  Sight, Hearing, Speech Sight Info: Impaired (eyeglasses) Hearing Info: Impaired (Bilateral hearing aids) Speech Info: Impaired (Dentures-top partials/lower dentures.)    SPECIAL CARE FACTORS FREQUENCY  PT (By licensed PT), OT (By licensed OT)     PT Frequency:  (5x week) OT Frequency:  (5x week)            Contractures Contractures Info: Not present    Additional Factors Info  Code Status, Allergies Code Status Info:  (DNR) Allergies Info:  (Pencillins)           Current Medications (01/18/2022):  This is the current hospital active medication list Current Facility-Administered Medications  Medication Dose Route Frequency Provider Last Rate Last Admin   0.9 %  sodium chloride infusion   Intravenous PRN Leighton Ruff, MD   Stopped at 01/04/22 1748   amiodarone (PACERONE) tablet 200 mg  200 mg Oral Daily Pixie Casino, MD   200 mg at 01/18/22 1013   apixaban (ELIQUIS) tablet 5 mg  5 mg Oral BID Margie Billet, NP   5 mg at 01/18/22 1013   bisacodyl (DULCOLAX) suppository 10 mg  10 mg Rectal Daily PRN Leighton Ruff, MD   10 mg at 01/05/22 1112   Chlorhexidine Gluconate Cloth 2 % PADS 6 each  6 each Topical Daily Leighton Ruff, MD   6 each at  01/17/22 2143   diphenhydrAMINE (BENADRYL) capsule 25 mg  25 mg Oral QHS [name], MD   25 mg at 01/17/22 2141   feeding supplement (ENSURE ENLIVE / ENSURE PLUS) liquid 237 mL  237 mL Oral TID WC Jill Alexanders, PA-C   237 mL at 01/18/22 1202   fentaNYL (DURAGESIC) 25 MCG/HR 1 patch  1 patch Transdermal Q72H Loistine Chance, MD   1 patch at 01/18/22 1203   haloperidol lactate (HALDOL) injection 1 mg  1 mg Intravenous Q6H PRN [name], MD   1 mg at 01/16/22 1314   heparin lock flush 100 unit/mL  500 Units Intracatheter Prior to  discharge Lane Hacker L, DO       HYDROmorphone (DILAUDID) injection 1 mg  1 mg Intravenous Q4H PRN Loistine Chance, MD       HYDROmorphone HCl (DILAUDID) liquid 2 mg  2 mg Oral Q4H PRN Loistine Chance, MD       lip balm (CARMEX) ointment   Topical PRN [name], MD   75 Application at 38/32/91 1132   metoprolol succinate (TOPROL-XL) 24 hr tablet 12.5 mg  12.5 mg Oral Daily Buford Dresser, MD   12.5 mg at 01/18/22 1013   ondansetron (ZOFRAN-ODT) disintegrating tablet 4 mg  4 mg Oral B1Y PRN Leighton Ruff, MD       Or   ondansetron [facility]) injection 4 mg  4 mg Intravenous O0A PRN Leighton Ruff, MD   4 mg at 00/45/99 7741   Oral care mouth rinse  15 mL Mouth Rinse PRN Maryjane Hurter, MD       Oral care mouth rinse  15 mL Mouth Rinse 4 times per day Aileen Fass, Tammi Klippel, MD   15 mL at 01/18/22 1201   Oral care mouth rinse  15 mL Mouth Rinse PRN Charlynne Cousins, MD       pantoprazole (PROTONIX) EC tablet 40 mg  40 mg Oral QHS [name], MD   40 mg at 01/17/22 2142   phenol (CHLORASEPTIC) mouth spray 2 spray  2 spray Mouth/Throat Q2H PRN [name], MD       QUEtiapine (SEROQUEL) tablet 50 mg  50 mg Oral QHS Bonnielee Haff, MD   50 mg at 01/17/22 2143   sodium chloride flush (NS) 0.9 % injection 10-40 mL  10-40 mL Intracatheter Q12H Jill Alexanders, PA-C   10 mL at 01/17/22 2200   sodium chloride flush (NS) 0.9 % injection 10-40 mL  10-40 mL Intracatheter PRN Jill Alexanders, PA-C   10 mL at 01/09/22 2220   thiamine tablet 100 mg  100 mg Oral Daily Lenis Noon, RPH   100 mg at 01/18/22 1013     Discharge Medications: Please see discharge summary for a list of discharge medications.  Relevant Imaging Results:  Relevant Lab Results:   Additional Information  [phone];COVID Vaccination x2)  Dessa Phi, RN

## 2022-01-18 NOTE — Progress Notes (Signed)
Patient Dillon Smith      DOB: 12-May-1930      GSU:[id number]      Palliative Medicine Team    Subjective: Bedside symptom check completed.    Physical exam: Patient resting in bed with eyes closed at time of visit. Breathing even and non-labored, no excessive secretions noted. Patient without physical or non-verbal signs of pain or discomfort at this time. At introduction, patient wakes, makes eye contact, and appropriately answers simple questions. Patient calm and in no distress, mitts off today.     Assessment and plan: Patient denies pain or discomfort today, he has no concerns or needs. He seems largely improved from prior two days' assessments. Will continue to follow for any changes or advances.    Thank you for allowing the Palliative Medicine Team to assist in the care of this patient.     Damian Leavell, MSN, RN Palliative Medicine Team Team Phone: [phone]  This phone is monitored 7a-7p, please reach out to attending physician outside of these hours for urgent needs.

## 2022-01-18 NOTE — Progress Notes (Signed)
[facility] PROGRESS NOTE   Dillon Smith DDU:[id number] DOB: 11/29/1929 DOA: 12/31/2021  PCP: [name]  Brief History/Interval Summary: 88 y.o. male past medical history of chronic kidney disease stage III, paroxysmal atrial fibrillation on anticoagulation at home, retention history of gastric cancer was admitted by the surgical service as work-up revealed pneumoperitoneum due to gastric perforation. Was taken to the OR and is status post exploratory laparotomy and drainage of intra-abdominal abscess on 7/3.  Initially tried hospitalist was consulted for medical management.  Patient has stabilized.  General surgery requesting hospitalist to take over care.    Consultants: General surgery.  Cardiology.  Palliative care.  Previously seen by critical care medicine.  Procedures: Exploratory laparotomy with drainage of intra-abdominal abscess 7/3    Subjective/Interval History: Patient is sleepy but easily arousable.  Denies any complaints.  Seems to be more cooperative.  Hopefully his mittens can be removed.     Assessment/Plan:  Pneumoperitoneum in the setting of gastric cancer perforation on chemotherapy: Status post surgical intervention with exploratory laparotomy and intra-abdominal abscesses on 7/3. General surgery has been following.  Patient pulled his core track feeding tube out on 7/19.  Currently on a dysphagia 1 diet.  No plans for reinsertion of feeding tube at this time.  Not a candidate for PEG tube due to his history of gastric cancer.  Palliative care is following. Pain seems to be adequately controlled.  He is noted to be on fentanyl patch.  Medical oncology was following.  Looks like patient was receiving neoadjuvant chemotherapy for his gastric cancer which he tolerated well.  Chemotherapy currently on hold.  Medical oncology to reevaluate treatment options in the outpatient setting.  Followed by [name]. Has had multiple loose stools likely due  to combination of tube feedings as well as resumption of bowel function.  Seems to have improved.    Acute kidney injury on chronic kidney disease stage IIIa: Looks like baseline creatinine is between 1.3-1.7.  Presented with creatinine greater than 2.  Renal function has stabilized.  Continue to monitor urine output.     Acute on chronic systolic and diastolic CHF EF known to be 25% based on echo done during this hospitalization.  Down from 45% in 2021. Cardiology has been following.  Patient noted to be on metoprolol.  Diuretics on hold for now.   Due to borderline blood pressure patient is not on ARB or ACE inhibitor.  Elevated creatinine also precludes this for now. Cardiac status is stable.  Remains off of diuretics at this time.  Acute metabolic encephalopathy/hospital delirium Has been noted to be agitated at times.  Does require mittens because he tends to pull on his dressing and IV.  Was given Haldol.  Last dose was on 7/19.  Started on Seroquel but dose increased yesterday.  Seems to be helping.  Continue to reorient.  Monitor for now.  Hopefully we can take his mittens off sometime today.    Persistent atrial fibrillation Cardiology has been managing.  Noted to be on amiodarone and metoprolol.  Also on apixaban for anticoagulation. Heart rate is reasonably well controlled.  Essential hypertension: Blood pressure is reasonably well controlled.   Hyperlipidemia: Should be able to resume statin at discharge  Hyperglycemia No diabetic medications noted on home medication list.  Elevated CBGs were likely due to tube feedings.     Normocytic anemia Likely anemia of chronic disease.  No evidence of overt blood loss.  Mild decrease in hemoglobin is  noted.  No overt bleeding appreciated.  Continue to monitor.  Goals of care/ethics: Palliative care following.  Patient is noted to be DNR.  Prognosis is guarded.    DVT Prophylaxis: On apixaban Code Status: DNR Family Communication:  No family at bedside.  Discussed with patient's daughter yesterday. Disposition Plan: Skilled nursing facility is recommended  Status is: Inpatient Remains inpatient appropriate because: Pneumoperitoneum, gastric cancer, poor oral intake      Medications: Scheduled:  amiodarone  200 mg Oral Daily   apixaban  5 mg Oral BID   Chlorhexidine Gluconate Cloth  6 each Topical Daily   diphenhydrAMINE  25 mg Oral QHS   feeding supplement  237 mL Oral TID WC   fentaNYL  1 patch Transdermal Q72H   metoprolol succinate  12.5 mg Oral Daily   mouth rinse  15 mL Mouth Rinse 4 times per day   pantoprazole  40 mg Oral QHS   QUEtiapine  50 mg Oral QHS   sodium chloride flush  10-40 mL Intracatheter Q12H   thiamine  100 mg Oral Daily   Continuous:  sodium chloride Stopped (01/04/22 1748)   EVO:JJKKXF chloride, bisacodyl, haloperidol lactate, heparin lock flush, HYDROmorphone (DILAUDID) injection, HYDROmorphone HCl, lip balm, ondansetron **OR** ondansetron (ZOFRAN) IV, mouth rinse, mouth rinse, phenol, sodium chloride flush  Antibiotics: Anti-infectives (From admission, onward)    Start     Dose/Rate Route Frequency Ordered Stop   01/04/22 1200  piperacillin-tazobactam (ZOSYN) IVPB 3.375 g  Status:  Discontinued        3.375 g 12.5 mL/hr over 240 Minutes Intravenous Every 8 hours 01/04/22 0729 01/08/22 1116   01/02/22 1400  piperacillin-tazobactam (ZOSYN) IVPB 2.25 g  Status:  Discontinued        2.25 g 100 mL/hr over 30 Minutes Intravenous Every 8 hours 01/02/22 0718 01/04/22 0728   01/01/22 2000  vancomycin (VANCOCIN) IVPB 1000 mg/200 mL premix  Status:  Discontinued        1,000 mg 200 mL/hr over 60 Minutes Intravenous Every 24 hours 12/31/21 1820 01/01/22 0824   12/31/21 2000  piperacillin-tazobactam (ZOSYN) IVPB 3.375 g  Status:  Discontinued        3.375 g 12.5 mL/hr over 240 Minutes Intravenous Every 8 hours 12/31/21 1821 01/02/22 0718   12/31/21 1130  vancomycin (VANCOREADY) IVPB  1750 mg/350 mL        1,750 mg 175 mL/hr over 120 Minutes Intravenous  Once 12/31/21 1124 12/31/21 1411   12/31/21 1130  ceFEPIme (MAXIPIME) 2 g in sodium chloride 0.9 % 100 mL IVPB        2 g 200 mL/hr over 30 Minutes Intravenous  Once 12/31/21 1124 12/31/21 1226       Objective:  Vital Signs  Vitals:   01/17/22 1328 01/17/22 2044 01/18/22 0454 01/18/22 1008  BP: (!) 128/53 140/68 127/65 137/66  Pulse: 82 76 76 76  Resp: '20 18 20   '$ Temp: 98.3 F (36.8 C) 98.5 F (36.9 C) 98 F (36.7 C)   TempSrc: Oral Oral    SpO2: 98%     Weight:      Height:        Intake/Output Summary (Last 24 hours) at 01/18/2022 1103 Last data filed at 01/18/2022 0617 Gross per 24 hour  Intake 240 ml  Output 1250 ml  Net -1010 ml    Filed Weights   01/15/22 0500 01/16/22 0500 01/17/22 0413  Weight: 78.8 kg 79.1 kg 78.9 kg    General appearance:  Awake alert.  In no distress Resp: Clear to auscultation bilaterally.  Normal effort Cardio: S1-S2 is normal regular.  No S3-S4.  No rubs murmurs or bruit GI: Abdomen is soft.  Dressing noted over the abdomen.  Bowel sounds heard. Extremities: No edema.  Full range of motion of lower extremities. Neurologic: No focal neurological deficits.    Lab Results:  Data Reviewed: I have personally reviewed following labs and reports of the imaging studies  CBC: Recent Labs  Lab 01/12/22 0344 01/17/22 0401 01/18/22 0222  WBC 5.4 10.6* 13.7*  HGB 8.8* 8.4* 8.2*  HCT 26.4* 26.2* 25.5*  MCV 90.4 95.3 94.4  PLT 260 387 389     Basic Metabolic Panel: Recent Labs  Lab 01/13/22 0504 01/14/22 0515 01/15/22 0420 01/17/22 0401 01/18/22 0222  NA 140 140 144 145 141  K 3.8 3.7 3.8 3.8 3.9  CL 105 105 109 112* 109  CO2 '29 28 29 26 26  '$ GLUCOSE 90 131* 123* 91 83  BUN 49* 46* 44* 32* 28*  CREATININE 1.70* 1.66* 1.60* 1.29* 1.31*  CALCIUM 8.1* 8.2* 8.4* 8.4* 8.4*     GFR: Estimated Creatinine Clearance: 39.9 mL/min (A) (by C-G formula based on  SCr of 1.31 mg/dL (H)).  Liver Function Tests: Recent Labs  Lab 01/17/22 0401  AST 20  ALT 28  ALKPHOS 65  BILITOT 0.4  PROT 6.9  ALBUMIN 1.8*       CBG: Recent Labs  Lab 01/17/22 0749 01/17/22 0820 01/17/22 1119 01/17/22 1654 01/18/22 0801  GLUCAP 63* 70 120* 101* 78       Radiology Studies: No results found.     LOS: 18 days   Bence Trapp [employer] on [email or web address]  01/18/2022, 11:03 AM

## 2022-01-18 NOTE — Progress Notes (Signed)
Progress Note  18 Days Post-Op  Subjective: Pt sleeping this AM, calm. No haldol in 24 hrs. He is eating more of meals and was upped to a D2 Diet yesterday. Denies abdominal pain.   Objective: Vital signs in last 24 hours: Temp:  [98 F (36.7 C)-98.5 F (36.9 C)] 98 F (36.7 C) (07/21 0454) Pulse Rate:  [76-82] 76 (07/21 0454) Resp:  [18-20] 20 (07/21 0454) BP: (120-140)/(53-68) 127/65 (07/21 0454) SpO2:  [98 %] 98 % (07/20 1328) Last BM Date : 01/16/22  Intake/Output from previous day: 07/20 0701 - 07/21 0700 In: 600 [P.O.:600] Out: 1250 [Urine:1250] Intake/Output this shift: No intake/output data recorded.  PE: General: pleasant, WD male who is laying in bed in NAD Heart: regular, rate, and rhythm.  Lungs: CTAB, no wheezes, rhonchi, or rales noted.  Respiratory effort nonlabored Abd: soft, NT, ND, +BS, midline wound clean    Lab Results:  Recent Labs    01/17/22 0401 01/18/22 0222  WBC 10.6* 13.7*  HGB 8.4* 8.2*  HCT 26.2* 25.5*  PLT 387 389   BMET Recent Labs    01/17/22 0401 01/18/22 0222  NA 145 141  K 3.8 3.9  CL 112* 109  CO2 26 26  GLUCOSE 91 83  BUN 32* 28*  CREATININE 1.29* 1.31*  CALCIUM 8.4* 8.4*    PT/INR No results for input(s): "LABPROT", "INR" in the last 72 hours. CMP     Component Value Date/Time   NA 141 01/18/2022 0222   NA 143 02/06/2021 0845   K 3.9 01/18/2022 0222   CL 109 01/18/2022 0222   CO2 26 01/18/2022 0222   GLUCOSE 83 01/18/2022 0222   BUN 28 (H) 01/18/2022 0222   BUN 23 02/06/2021 0845   CREATININE 1.31 (H) 01/18/2022 0222   CREATININE 1.57 (H) 12/13/2021 0827   CALCIUM 8.4 (L) 01/18/2022 0222   PROT 6.9 01/17/2022 0401   PROT 6.4 02/06/2021 0845   ALBUMIN 1.8 (L) 01/17/2022 0401   ALBUMIN 3.9 02/06/2021 0845   AST 20 01/17/2022 0401   AST 18 12/13/2021 0827   ALT 28 01/17/2022 0401   ALT 17 12/13/2021 0827   ALKPHOS 65 01/17/2022 0401   BILITOT 0.4 01/17/2022 0401   BILITOT 0.5 12/13/2021 0827    GFRNONAA 52 (L) 01/18/2022 0222   GFRNONAA 42 (L) 12/13/2021 0827   GFRAA 43 (L) 07/06/2020 1156   Lipase     Component Value Date/Time   LIPASE 35 12/31/2021 0941       Studies/Results: No results found.  Anti-infectives: Anti-infectives (From admission, onward)    Start     Dose/Rate Route Frequency Ordered Stop   01/04/22 1200  piperacillin-tazobactam (ZOSYN) IVPB 3.375 g  Status:  Discontinued        3.375 g 12.5 mL/hr over 240 Minutes Intravenous Every 8 hours 01/04/22 0729 01/08/22 1116   01/02/22 1400  piperacillin-tazobactam (ZOSYN) IVPB 2.25 g  Status:  Discontinued        2.25 g 100 mL/hr over 30 Minutes Intravenous Every 8 hours 01/02/22 0718 01/04/22 0728   01/01/22 2000  vancomycin (VANCOCIN) IVPB 1000 mg/200 mL premix  Status:  Discontinued        1,000 mg 200 mL/hr over 60 Minutes Intravenous Every 24 hours 12/31/21 1820 01/01/22 0824   12/31/21 2000  piperacillin-tazobactam (ZOSYN) IVPB 3.375 g  Status:  Discontinued        3.375 g 12.5 mL/hr over 240 Minutes Intravenous Every 8 hours 12/31/21 1821 01/02/22  0947   12/31/21 1130  vancomycin (VANCOREADY) IVPB 1750 mg/350 mL        1,750 mg 175 mL/hr over 120 Minutes Intravenous  Once 12/31/21 1124 12/31/21 1411   12/31/21 1130  ceFEPIme (MAXIPIME) 2 g in sodium chloride 0.9 % 100 mL IVPB        2 g 200 mL/hr over 30 Minutes Intravenous  Once 12/31/21 1124 12/31/21 1226        Assessment/Plan Pneumoperitoneum in the setting of known gastric cancer on chemotherapy POD #18 status post ex lap drainage of intra-abdominal abscess 7/3 Dr. Marlou Starks  - continue wound care - healing well  - drains out - cortrak out, recommend leaving out and encouraging PO intake - not a candidate for placement of PEG/gastrostomy/j-tube given known gastric cancer with recent perforation  - at this point patient is surgically stable and this is really more of a placement issue - TOC working on this  - continue therapies  - we are  available over the weekend if surgical issues arise but otherwise will see early next week  FEN: D1 diet, ensure VTE: eliquis BID ID: currently off abx  - below per TRH -  CKD stage III Hx of CVA A. Fib on eliquis HTN HLD CAD Hx of prostate cancer    LOS: 18 days     Norm Parcel, [facility] 01/18/2022, 10:02 AM Please see Amion for pager number during day hours 7:00am-4:30pm

## 2022-01-18 NOTE — Progress Notes (Signed)
Physical Therapy Treatment Patient Details Name: Dillon Smith MRN: [id number] DOB: 10-21-1929 Today's Date: 01/18/2022   History of Present Illness 88 yo male admitted with gastric perforation. S/P ex lap, drainage of abscess. Hx of gastric ca, ckd, bradycardia, covid, prostate ca, cva, afib.    PT Comments    General Comments: AxO x 2 following commands, pleasant, more conversive, his 2 daughters were in room and he was conversing with them.  VERY HOH.  Assisted OOB required increased time and much effort.  General bed mobility comments: with increased time and repeat VC's to stay on task, pt was more self able to rise to EOB. General Gait Details: pt was able to amb 12 feet + 2 Max Assist using Harmon Pier walker with daughter assisting by following with recliner. General Gait Details: pt was able to amb 12 feet + 2 Max Assist using Harmon Pier walker with daughter assisting by following with recliner. Positioned in recliner. Pt will need ST Rehab at SNF to address mobility and functional decline prior to safely returning home.   Recommendations for follow up therapy are one component of a multi-disciplinary discharge planning process, led by the attending physician.  Recommendations may be updated based on patient status, additional functional criteria and insurance authorization.  Follow Up Recommendations  Skilled nursing-short term rehab (<3 hours/day)     Assistance Recommended at Discharge Frequent or constant Supervision/Assistance  Patient can return home with the following Two people to help with walking and/or transfers;Two people to help with bathing/dressing/bathroom;Assist for transportation;Help with stairs or ramp for entrance;Assistance with cooking/housework   Equipment Recommendations  None recommended by PT    Recommendations for Other Services       Precautions / Restrictions Precautions Precautions: Fall Precaution Comments: PICC, ABD incision, tele, PremoFit, has back pain  and tends to slide down in chair (fall risk from chair) Restrictions Weight Bearing Restrictions: No     Mobility  Bed Mobility Overal bed mobility: Needs Assistance Bed Mobility: Supine to Sit     Supine to sit: Mod assist     General bed mobility comments: with increased time and repeat VC's to stay on task, pt was more self able to rise to EOB.    Transfers Overall transfer level: Needs assistance Equipment used: Rolling walker (2 wheels) Transfers: Sit to/from Stand Sit to Stand: Mod assist, +2 physical assistance, +2 safety/equipment           General transfer comment: pt required + 2 side by side MOD Asisst to rise from elevated.  Poor forward flex posture (hips/knees)    Ambulation/Gait Ambulation/Gait assistance: Max assist, Total assist, +2 physical assistance, +2 safety/equipment Gait Distance (Feet): 12 Feet Assistive device: Ethelene Hal Gait Pattern/deviations: Step-to pattern, Trunk flexed, Narrow base of support Gait velocity: decreased     General Gait Details: pt was able to amb 12 feet + 2 Max Assist using Harmon Pier walker with daughter assisting by following with recliner.   Stairs             Wheelchair Mobility    Modified Rankin (Stroke Patients Only)       Balance                                            Cognition Arousal/Alertness: Awake/alert Behavior During Therapy: WFL for tasks assessed/performed Overall Cognitive Status: Within Functional Limits for tasks assessed  General Comments: AxO x 2 following commands, pleasant, more conversive, his 2 daughters were in room and he was conversing with them.  VERY HOH.        Exercises      General Comments        Pertinent Vitals/Pain Pain Assessment Pain Assessment: No/denies pain    Home Living                          Prior Function            PT Goals (current goals can now be found in the  care plan section) Progress towards PT goals: Progressing toward goals    Frequency    Min 2X/week      PT Plan Current plan remains appropriate    Co-evaluation              AM-PAC PT "6 Clicks" Mobility   Outcome Measure  Help needed turning from your back to your side while in a flat bed without using bedrails?: A Lot Help needed moving from lying on your back to sitting on the side of a flat bed without using bedrails?: A Lot Help needed moving to and from a bed to a chair (including a wheelchair)?: A Lot Help needed standing up from a chair using your arms (e.g., wheelchair or bedside chair)?: A Lot Help needed to walk in hospital room?: A Lot Help needed climbing 3-5 steps with a railing? : Total 6 Click Score: 11    End of Session Equipment Utilized During Treatment: Gait belt Activity Tolerance: Patient tolerated treatment well Patient left: in chair;with chair alarm set;with call bell/phone within reach Nurse Communication: Mobility status PT Visit Diagnosis: Pain;Muscle weakness (generalized) (M62.81);Difficulty in walking, not elsewhere classified (R26.2)     Time: 1115-1140 PT Time Calculation (min) (ACUTE ONLY): 25 min  Charges:  $Gait Training: 8-22 mins $Therapeutic Activity: 8-22 mins                     {Jadon Ressler  PTA Acute  Rehabilitation Services Office M-F          [phone] Weekend pager [phone]

## 2022-01-18 NOTE — TOC Progression Note (Signed)
Transition of Care [facility]) - Progression Note    Patient Details  Name: ASAD KEEVEN MRN: [id number] Date of Birth: October 28, 1929  Transition of Care [facility]) CM/SW Contact  Tatyanna Cronk, Juliann Pulse, RN Phone Number: 01/18/2022, 2:50 PM  Clinical Narrative: mitts off. Faxed out per permission from Jean(dtr)-await bed offers, & choice.     Expected Discharge Plan: [place] Barriers to Discharge: Continued Medical Work up  Expected Discharge Plan and Services Expected Discharge Plan: [place]   Discharge Planning Services: CM Consult   Living arrangements for the past 2 months: Single Family Home                                       Social Determinants of Health (SDOH) Interventions    Readmission Risk Interventions     No data to display

## 2022-01-18 NOTE — Progress Notes (Signed)
Mittens safely removed from patient.

## 2022-01-18 NOTE — Progress Notes (Signed)
Speech Language Pathology Treatment: Dysphagia  Patient Details Name: Dillon Smith MRN: [id number] DOB: 05-02-1930 Today's Date: 01/18/2022 Time: 1010-1037 SLP Time Calculation (min) (ACUTE ONLY): 27 min  Assessment / Plan / Recommendation Clinical Impression  Skilled SLP follow up indicated to assure po tolerance of dietary advancement and effectiveness of compensation strategies.  SLP facilitated po intake including providing oral care, dental brushing and placing dentures..  Pt observed consuming grits, eggs, milk, juice and water.  Prolonged mastication with oral retention bilateral buccal region without pt sensation but effectively cleared with liquid swallows that he requested.  Had pt feed himself for neurological input for maximum safety with swallowing but intermittently check on pt to assure tolerance.  Check for oral retention after meals.    Family members x3 arrived and educated them and the pt to advise to continue current diet reviewing swallowing precautions/mitigation strategies.  Pt observed with frequent throat clearing after sequential swallows of liquids but no overt indication of airway compromise.    At this time, pt is tolerating his current diet regimen and would not recommend to advance beyond dys2/thin due to his oral deficits.  All education completed and swallow precaution sign updated. Thanks for allowing me to help care for this pt.    HPI HPI: 88yo male admitted 12/31/21 with abdominal pain. PMH: HTN, prostate cancer, HLD, AFib, CKD3, CVA, CAD s/p CABG, current gastric adenocarcinoma undergoing chemotherapy.  Pt's diet was changed from puree to dys2/thin yesterday.  Today follow up indicated to assure po tolerance and effective compensation strategies are in place.      SLP Plan  Continue with current plan of care      Recommendations for follow up therapy are one component of a multi-disciplinary discharge planning process, led by the attending physician.   Recommendations may be updated based on patient status, additional functional criteria and insurance authorization.    Recommendations  Diet recommendations: Dysphagia 2 (fine chop);Thin liquid Liquids provided via: Cup;Straw Medication Administration: Other (Comment) (as tolerated) Supervision: Staff to assist with self feeding;Full supervision/cueing for compensatory strategies Compensations: Minimize environmental distractions;Slow rate;Small sips/bites;Other (Comment);Follow solids with liquid (use liquids ore puree to aid oral clearance of solids - buccal region retention without awareness)  Have pt feed self as able due to prolonged mastication but intact ability to clear with liquids.   Postural Changes and/or Swallow Maneuvers: Seated upright 90 degrees;Upright 30-60 min after meal (partially upright after meals)       SLP brushed pt's teeth, dentures, partial and placed teeth - partial ill fitting even with adhesive - therefore removed and asked family to take them home to prevent loss- to which they agreed.          Oral Care Recommendations:  (oral care at am and pm and after meals) Follow Up Recommendations: Skilled nursing-short term rehab (<3 hours/day) Assistance recommended at discharge: Frequent or constant Supervision/Assistance SLP Visit Diagnosis: Dysphagia, unspecified (R13.10) Plan: Continue with current plan of care         Kathleen Lime, MS [place] Office [phone] Pager [phone]   Dillon Smith  01/18/2022, 10:47 AM

## 2022-01-19 DIAGNOSIS — I4819 Other persistent atrial fibrillation: Secondary | ICD-10-CM | POA: Diagnosis not present

## 2022-01-19 DIAGNOSIS — N1831 Chronic kidney disease, stage 3a: Secondary | ICD-10-CM | POA: Diagnosis not present

## 2022-01-19 DIAGNOSIS — D649 Anemia, unspecified: Secondary | ICD-10-CM | POA: Diagnosis not present

## 2022-01-19 LAB — GLUCOSE, CAPILLARY: Glucose-Capillary: 89 mg/dL (ref 70–99)

## 2022-01-19 NOTE — Progress Notes (Signed)
[facility] PROGRESS NOTE   Dillon Smith KGU:[id number] DOB: 1930-04-16 DOA: 12/31/2021  PCP: [name]  Brief History/Interval Summary: 88 y.o. male past medical history of chronic kidney disease stage III, paroxysmal atrial fibrillation on anticoagulation at home, retention history of gastric cancer was admitted by the surgical service as work-up revealed pneumoperitoneum due to gastric perforation. Was taken to the OR and is status post exploratory laparotomy and drainage of intra-abdominal abscess on 7/3.  Initially tried hospitalist was consulted for medical management.  Patient has stabilized.  General surgery requesting hospitalist to take over care.    Consultants: General surgery.  Cardiology.  Palliative care.  Previously seen by critical care medicine.  Procedures: Exploratory laparotomy with drainage of intra-abdominal abscess 7/3    Subjective/Interval History: Patient is sleepy but easily arousable.  Discussed with nursing staff.  Patient's behavior is much better in the last 48 hours.  His mittens were removed yesterday.  He has not been as agitated as before.     Assessment/Plan:  Pneumoperitoneum in the setting of gastric cancer perforation on chemotherapy: Status post surgical intervention with exploratory laparotomy and intra-abdominal abscesses on 7/3. General surgery has been following.  Patient pulled his core track feeding tube out on 7/19.  Currently on a dysphagia 1 diet.  No plans for reinsertion of feeding tube at this time.  Not a candidate for PEG tube due to his history of gastric cancer.  Palliative care is following. Pain seems to be adequately controlled.  He is noted to be on fentanyl patch.  Medical oncology was following.  Looks like patient was receiving neoadjuvant chemotherapy for his gastric cancer which he tolerated well.  Chemotherapy currently on hold.  Medical oncology to reevaluate treatment options in the outpatient  setting.  Followed by [name]. Has had multiple loose stools likely due to combination of tube feedings as well as resumption of bowel function.  Seems to have improved.    Acute kidney injury on chronic kidney disease stage IIIa: Looks like baseline creatinine is between 1.3-1.7.  Presented with creatinine greater than 2.  Renal function has stabilized.  Continue to monitor urine output.     Acute on chronic systolic and diastolic CHF EF known to be 25% based on echo done during this hospitalization.  Down from 45% in 2021. Cardiology has been following.  Patient noted to be on metoprolol.  Diuretics on hold for now.   Due to borderline blood pressure patient is not on ARB or ACE inhibitor.  Elevated creatinine also precludes this for now. Cardiac status is stable.  Remains off of diuretics at this time.  Acute metabolic encephalopathy/hospital delirium Had episodes of agitation for several days.  Required mittens.  Was given Haldol.  Subsequently started on Seroquel but seems to be helping.  Mittens were removed yesterday.  Continue current dose of Seroquel for now.    Persistent atrial fibrillation Cardiology has been managing.  Noted to be on amiodarone and metoprolol.  Also on apixaban for anticoagulation. Heart rate is reasonably well controlled.  Essential hypertension: Blood pressure is reasonably well controlled.   Hyperlipidemia: Should be able to resume statin at discharge  Hyperglycemia No diabetic medications noted on home medication list.  Elevated CBGs were likely due to tube feedings.  Now resolved.   Normocytic anemia Likely anemia of chronic disease.  No evidence of overt blood loss.  Mild decrease in hemoglobin is noted.  No overt bleeding appreciated.  Continue to monitor.  Check labs tomorrow.  Goals of care/ethics: Palliative care following.  Patient is noted to be DNR.  Prognosis is guarded.    DVT Prophylaxis: On apixaban Code Status: DNR Family  Communication: No family at bedside.  Daughter was updated the day before yesterday.  We will call her again today. Disposition Plan: Skilled nursing facility is recommended  Status is: Inpatient Remains inpatient appropriate because: Pneumoperitoneum, gastric cancer, poor oral intake      Medications: Scheduled:  amiodarone  200 mg Oral Daily   apixaban  5 mg Oral BID   Chlorhexidine Gluconate Cloth  6 each Topical Daily   diphenhydrAMINE  25 mg Oral QHS   feeding supplement  237 mL Oral TID WC   fentaNYL  1 patch Transdermal Q72H   metoprolol succinate  12.5 mg Oral Daily   mouth rinse  15 mL Mouth Rinse 4 times per day   pantoprazole  40 mg Oral QHS   QUEtiapine  50 mg Oral QHS   sodium chloride flush  10-40 mL Intracatheter Q12H   thiamine  100 mg Oral Daily   Continuous:  sodium chloride Stopped (01/04/22 1748)   WUX:LKGMWN chloride, bisacodyl, haloperidol lactate, heparin lock flush, HYDROmorphone (DILAUDID) injection, HYDROmorphone HCl, lip balm, ondansetron **OR** ondansetron (ZOFRAN) IV, mouth rinse, mouth rinse, phenol, sodium chloride flush  Antibiotics: Anti-infectives (From admission, onward)    Start     Dose/Rate Route Frequency Ordered Stop   01/04/22 1200  piperacillin-tazobactam (ZOSYN) IVPB 3.375 g  Status:  Discontinued        3.375 g 12.5 mL/hr over 240 Minutes Intravenous Every 8 hours 01/04/22 0729 01/08/22 1116   01/02/22 1400  piperacillin-tazobactam (ZOSYN) IVPB 2.25 g  Status:  Discontinued        2.25 g 100 mL/hr over 30 Minutes Intravenous Every 8 hours 01/02/22 0718 01/04/22 0728   01/01/22 2000  vancomycin (VANCOCIN) IVPB 1000 mg/200 mL premix  Status:  Discontinued        1,000 mg 200 mL/hr over 60 Minutes Intravenous Every 24 hours 12/31/21 1820 01/01/22 0824   12/31/21 2000  piperacillin-tazobactam (ZOSYN) IVPB 3.375 g  Status:  Discontinued        3.375 g 12.5 mL/hr over 240 Minutes Intravenous Every 8 hours 12/31/21 1821 01/02/22 0718    12/31/21 1130  vancomycin (VANCOREADY) IVPB 1750 mg/350 mL        1,750 mg 175 mL/hr over 120 Minutes Intravenous  Once 12/31/21 1124 12/31/21 1411   12/31/21 1130  ceFEPIme (MAXIPIME) 2 g in sodium chloride 0.9 % 100 mL IVPB        2 g 200 mL/hr over 30 Minutes Intravenous  Once 12/31/21 1124 12/31/21 1226       Objective:  Vital Signs  Vitals:   01/18/22 1251 01/18/22 1431 01/18/22 2045 01/19/22 0403  BP: (!) 144/67  124/64 (!) 115/49  Pulse: 62  86 85  Resp: '18  18 18  '$ Temp: 97.8 F (36.6 C)  98.7 F (37.1 C) (!) 97.5 F (36.4 C)  TempSrc: Oral  Oral Oral  SpO2: 93%  98% 99%  Weight:  81.8 kg    Height:  '5\' 11"'$  (1.803 m)      Intake/Output Summary (Last 24 hours) at 01/19/2022 1024 Last data filed at 01/19/2022 0356 Gross per 24 hour  Intake 600 ml  Output 600 ml  Net 0 ml    Filed Weights   01/16/22 0500 01/17/22 0413 01/18/22 1431  Weight: 79.1 kg 78.9  kg 81.8 kg    General appearance: Somnolent but easily arousable.  In no distress. Resp: Clear to auscultation bilaterally.  Normal effort Cardio: S1-S2 is normal regular.  No S3-S4.  No rubs murmurs or bruit GI: Abdomen is soft.  Dressing noted over the abdomen Neurologic:   No focal neurological deficits.    Lab Results:  Data Reviewed: I have personally reviewed following labs and reports of the imaging studies  CBC: Recent Labs  Lab 01/17/22 0401 01/18/22 0222  WBC 10.6* 13.7*  HGB 8.4* 8.2*  HCT 26.2* 25.5*  MCV 95.3 94.4  PLT 387 389     Basic Metabolic Panel: Recent Labs  Lab 01/13/22 0504 01/14/22 0515 01/15/22 0420 01/17/22 0401 01/18/22 0222  NA 140 140 144 145 141  K 3.8 3.7 3.8 3.8 3.9  CL 105 105 109 112* 109  CO2 '29 28 29 26 26  '$ GLUCOSE 90 131* 123* 91 83  BUN 49* 46* 44* 32* 28*  CREATININE 1.70* 1.66* 1.60* 1.29* 1.31*  CALCIUM 8.1* 8.2* 8.4* 8.4* 8.4*     GFR: Estimated Creatinine Clearance: 39.9 mL/min (A) (by C-G formula based on SCr of 1.31 mg/dL (H)).  Liver  Function Tests: Recent Labs  Lab 01/17/22 0401  AST 20  ALT 28  ALKPHOS 65  BILITOT 0.4  PROT 6.9  ALBUMIN 1.8*       CBG: Recent Labs  Lab 01/17/22 1654 01/18/22 0801 01/18/22 1139 01/18/22 1627 01/18/22 2043  GLUCAP 101* 78 100* 93 83       Radiology Studies: No results found.     LOS: 19 days   Dellene Mcgroarty [employer] on [email or web address]  01/19/2022, 10:24 AM

## 2022-01-20 DIAGNOSIS — D649 Anemia, unspecified: Secondary | ICD-10-CM | POA: Diagnosis not present

## 2022-01-20 DIAGNOSIS — N1831 Chronic kidney disease, stage 3a: Secondary | ICD-10-CM | POA: Diagnosis not present

## 2022-01-20 DIAGNOSIS — I4819 Other persistent atrial fibrillation: Secondary | ICD-10-CM | POA: Diagnosis not present

## 2022-01-20 LAB — CBC
HCT: 22.6 % — ABNORMAL LOW (ref 39.0–52.0)
Hemoglobin: 7.6 g/dL — ABNORMAL LOW (ref 13.0–17.0)
MCH: 31.1 pg (ref 26.0–34.0)
MCHC: 33.6 g/dL (ref 30.0–36.0)
MCV: 92.6 fL (ref 80.0–100.0)
Platelets: 335 10*3/uL (ref 150–400)
RBC: 2.44 MIL/uL — ABNORMAL LOW (ref 4.22–5.81)
RDW: 15.8 % — ABNORMAL HIGH (ref 11.5–15.5)
WBC: 12.4 10*3/uL — ABNORMAL HIGH (ref 4.0–10.5)
nRBC: 0 % (ref 0.0–0.2)

## 2022-01-20 LAB — BASIC METABOLIC PANEL
Anion gap: 6 (ref 5–15)
BUN: 23 mg/dL (ref 8–23)
CO2: 23 mmol/L (ref 22–32)
Calcium: 8.2 mg/dL — ABNORMAL LOW (ref 8.9–10.3)
Chloride: 108 mmol/L (ref 98–111)
Creatinine, Ser: 1.46 mg/dL — ABNORMAL HIGH (ref 0.61–1.24)
GFR, Estimated: 45 mL/min — ABNORMAL LOW (ref >60)
Glucose, Bld: 79 mg/dL (ref 70–99)
Potassium: 3.8 mmol/L (ref 3.5–5.1)
Sodium: 137 mmol/L (ref 135–145)

## 2022-01-20 MED ORDER — GERHARDT'S BUTT CREAM
TOPICAL_CREAM | Freq: Three times a day (TID) | CUTANEOUS | Status: DC
  Filled 2022-01-20: qty 1

## 2022-01-20 NOTE — Progress Notes (Addendum)
[facility] PROGRESS NOTE   ADIT RIDDLES YQM:[id number] DOB: 11/29/1929 DOA: 12/31/2021  PCP: [name]  Brief History/Interval Summary: 88 y.o. male past medical history of chronic kidney disease stage III, paroxysmal atrial fibrillation on anticoagulation at home, retention history of gastric cancer was admitted by the surgical service as work-up revealed pneumoperitoneum due to gastric perforation. Was taken to the OR and is status post exploratory laparotomy and drainage of intra-abdominal abscess on 7/3.  Initially tried hospitalist was consulted for medical management.  Patient has stabilized.  General surgery requesting hospitalist to take over care.    Consultants: General surgery.  Cardiology.  Palliative care.  Previously seen by critical care medicine.  Procedures: Exploratory laparotomy with drainage of intra-abdominal abscess 7/3    Subjective/Interval History: Patient awake alert.  Denies any complaints.  States that he wants to go home.  No agitation reported overnight.     Assessment/Plan:  Pneumoperitoneum in the setting of gastric cancer perforation on chemotherapy: Status post surgical intervention with exploratory laparotomy and intra-abdominal abscesses on 7/3. General surgery has been following.  Patient pulled his core track feeding tube out on 7/19.  Currently on a dysphagia 1 diet.  No plans for reinsertion of feeding tube at this time.  Not a candidate for PEG tube due to his history of gastric cancer.  Palliative care is following. Pain seems to be adequately controlled.  He is noted to be on fentanyl patch.  Medical oncology was following.  Looks like patient was receiving neoadjuvant chemotherapy for his gastric cancer which he tolerated well.  Chemotherapy currently on hold.  Medical oncology to reevaluate treatment options in the outpatient setting.  Followed by [name]. Has had multiple loose stools likely due to combination of tube  feedings as well as resumption of bowel function.  Seems to have improved.   Patient status noted to be stable.  General surgery to reevaluate tomorrow.  Will need wound care recommendations from them prior to transfer to skilled nursing facility.  Acute kidney injury on chronic kidney disease stage IIIa: Looks like baseline creatinine is between 1.3-1.7.  Presented with creatinine greater than 2.  Renal function has stabilized.  Continue to monitor urine output.     Acute on chronic systolic and diastolic CHF EF known to be 25% based on echo done during this hospitalization.  Down from 45% in 2021. Cardiology has been following.  Patient noted to be on metoprolol.  Diuretics on hold for now.   Due to borderline blood pressure patient is not on ARB or ACE inhibitor.  Elevated creatinine also precludes this for now. Cardiac status is stable.  Remains off of diuretics at this time.  Acute metabolic encephalopathy/hospital delirium Had episodes of agitation for several days.  Required mittens.  Was given Haldol.  Subsequently started on Seroquel which seems to be helping.  Mittens were removed. Continue current dose of Seroquel for now.   Pleasant this morning.  Persistent atrial fibrillation Cardiology has been managing.  Noted to be on amiodarone and metoprolol.  Also on apixaban for anticoagulation. Heart rate is reasonably well controlled.  Essential hypertension: Blood pressure is reasonably well controlled.   Hyperlipidemia: Should be able to resume statin at discharge  Hyperglycemia No diabetic medications noted on home medication list.  Elevated CBGs were likely due to tube feedings.  Now resolved.   Normocytic anemia Likely anemia of chronic disease.    Mild drop in hemoglobin is noted.  Could be dilutional.  No overt blood loss noted.  Will recheck tomorrow to ensure stability.  Goals of care/ethics: Palliative care following.  Patient is noted to be DNR.  Prognosis is  guarded.    Mild irritant contact dermatitis MASD.  Noted in the sacral area.  Seen by wound care.   DVT Prophylaxis: On apixaban Code Status: DNR Family Communication: No family at bedside.  Daughter was updated yesterday. Disposition Plan: Skilled nursing facility is recommended  Status is: Inpatient Remains inpatient appropriate because: Pneumoperitoneum, gastric cancer, poor oral intake      Medications: Scheduled:  amiodarone  200 mg Oral Daily   apixaban  5 mg Oral BID   Chlorhexidine Gluconate Cloth  6 each Topical Daily   diphenhydrAMINE  25 mg Oral QHS   feeding supplement  237 mL Oral TID WC   fentaNYL  1 patch Transdermal Q72H   Gerhardt's butt cream   Topical TID   metoprolol succinate  12.5 mg Oral Daily   mouth rinse  15 mL Mouth Rinse 4 times per day   pantoprazole  40 mg Oral QHS   QUEtiapine  50 mg Oral QHS   sodium chloride flush  10-40 mL Intracatheter Q12H   thiamine  100 mg Oral Daily   Continuous:  sodium chloride Stopped (01/04/22 1748)   KVQ:QVZDGL chloride, bisacodyl, haloperidol lactate, heparin lock flush, HYDROmorphone (DILAUDID) injection, HYDROmorphone HCl, lip balm, ondansetron **OR** ondansetron (ZOFRAN) IV, mouth rinse, mouth rinse, phenol, sodium chloride flush  Antibiotics: Anti-infectives (From admission, onward)    Start     Dose/Rate Route Frequency Ordered Stop   01/04/22 1200  piperacillin-tazobactam (ZOSYN) IVPB 3.375 g  Status:  Discontinued        3.375 g 12.5 mL/hr over 240 Minutes Intravenous Every 8 hours 01/04/22 0729 01/08/22 1116   01/02/22 1400  piperacillin-tazobactam (ZOSYN) IVPB 2.25 g  Status:  Discontinued        2.25 g 100 mL/hr over 30 Minutes Intravenous Every 8 hours 01/02/22 0718 01/04/22 0728   01/01/22 2000  vancomycin (VANCOCIN) IVPB 1000 mg/200 mL premix  Status:  Discontinued        1,000 mg 200 mL/hr over 60 Minutes Intravenous Every 24 hours 12/31/21 1820 01/01/22 0824   12/31/21 2000   piperacillin-tazobactam (ZOSYN) IVPB 3.375 g  Status:  Discontinued        3.375 g 12.5 mL/hr over 240 Minutes Intravenous Every 8 hours 12/31/21 1821 01/02/22 0718   12/31/21 1130  vancomycin (VANCOREADY) IVPB 1750 mg/350 mL        1,750 mg 175 mL/hr over 120 Minutes Intravenous  Once 12/31/21 1124 12/31/21 1411   12/31/21 1130  ceFEPIme (MAXIPIME) 2 g in sodium chloride 0.9 % 100 mL IVPB        2 g 200 mL/hr over 30 Minutes Intravenous  Once 12/31/21 1124 12/31/21 1226       Objective:  Vital Signs  Vitals:   01/19/22 0403 01/19/22 1252 01/19/22 2008 01/20/22 0538  BP: (!) 115/49 116/63 (!) 128/55 (!) 143/61  Pulse: 85 80 78 85  Resp: '18 16 18 18  '$ Temp: (!) 97.5 F (36.4 C) 98.3 F (36.8 C) 98.8 F (37.1 C) 98.1 F (36.7 C)  TempSrc: Oral  Oral Oral  SpO2: 99% 100% 99% 98%  Weight:      Height:        Intake/Output Summary (Last 24 hours) at 01/20/2022 1029 Last data filed at 01/20/2022 0418 Gross per 24 hour  Intake 840 ml  Output 1400 ml  Net -560 ml    Filed Weights   01/16/22 0500 01/17/22 0413 01/18/22 1431  Weight: 79.1 kg 78.9 kg 81.8 kg    General appearance: Awake alert.  In no distress.  Mildly distracted Resp: Clear to auscultation bilaterally.  Normal effort Cardio: S1-S2 is normal regular.  No S3-S4.  No rubs murmurs or bruit GI: Abdomen is soft.  Dressing noted Extremities: No edema.  Full range of motion of lower extremities. Neurologic: No focal neurological deficits.    Lab Results:  Data Reviewed: I have personally reviewed following labs and reports of the imaging studies  CBC: Recent Labs  Lab 01/17/22 0401 01/18/22 0222 01/20/22 0312  WBC 10.6* 13.7* 12.4*  HGB 8.4* 8.2* 7.6*  HCT 26.2* 25.5* 22.6*  MCV 95.3 94.4 92.6  PLT 387 389 335     Basic Metabolic Panel: Recent Labs  Lab 01/14/22 0515 01/15/22 0420 01/17/22 0401 01/18/22 0222 01/20/22 0312  NA 140 144 145 141 137  K 3.7 3.8 3.8 3.9 3.8  CL 105 109 112* 109  108  CO2 '28 29 26 26 23  '$ GLUCOSE 131* 123* 91 83 79  BUN 46* 44* 32* 28* 23  CREATININE 1.66* 1.60* 1.29* 1.31* 1.46*  CALCIUM 8.2* 8.4* 8.4* 8.4* 8.2*     GFR: Estimated Creatinine Clearance: 35.8 mL/min (A) (by C-G formula based on SCr of 1.46 mg/dL (H)).  Liver Function Tests: Recent Labs  Lab 01/17/22 0401  AST 20  ALT 28  ALKPHOS 65  BILITOT 0.4  PROT 6.9  ALBUMIN 1.8*       CBG: Recent Labs  Lab 01/18/22 0801 01/18/22 1139 01/18/22 1627 01/18/22 2043 01/19/22 2009  GLUCAP 78 100* 93 83 89       Radiology Studies: No results found.     LOS: 20 days   Jabe Jeanbaptiste [employer] on [email or web address]  01/20/2022, 10:29 AM

## 2022-01-20 NOTE — TOC Progression Note (Addendum)
Transition of Care [facility]) - Progression Note    Patient Details  Name: Dillon Smith MRN: [id number] Date of Birth: 11/29/1929  Transition of Care [facility]) CM/SW Contact  Dondre Catalfamo, Juliann Pulse, RN Phone Number: 01/20/2022, 11:22 AM  Clinical Narrative:Bed offers to be given, await choice.   -2:20p-Dtr [name] chose [place]-unable to leave vm(mailbox full) for Christine-left message w/main tel# spoke to [place] leave physical message in [place] for tomorrow in am. Await acceptance.    1. 1.2 mi [facility] [address] [place], [place] [address] [phone] Overall rating Below average 2. 1.4 mi [place] at the [place], [place] [address] [phone] Overall rating Above average 3. [place], [place] [address] [phone] Overall rating Much above average 4. 2.3 mi [place]y [place], [place] [address] [phone] Overall rating Much below average 5. 2.7 [place] [address], [place] [address] [phone] Overall rating Much below average 6. 3.1 mi [place] [facility] [place], [place] [address] [phone] Overall rating Average 7. 3.1 mi [facility] and [place], [place] [address] [phone] Overall rating Much below average 8. 3.7 mi [place] [address] [place], [place] [address] [phone] Overall rating Much below average 9. 3.8 mi [facility] [place], [place] [address] [phone] Overall rating Much below average 10. 5.4 [place], [place]  [address] [phone] Overall rating Average 11. 5.7 mi [facility] at [place], [place] [address] [phone] Overall rating Much above average 12. 7.2 mi [place], [place] [address] [phone] Overall rating Above average 13. 7.3 mi [facility] and [place], [place] [address] [phone] Overall rating Below average 14. 8.2 [place], [place] [address] [phone] Overall rating Much above average 15. 10.6 mi [facility] [address] [place], [place] [address] [phone] Overall rating Above average 16. 10.9 mi [facility] [address] [place], [place] [address] [phone] Overall rating Much above average 17. 12.5 mi [place] at [facility] [address] [place], [place] [address] [phone] Overall rating Much above average 18. 14.1 mi [facility] [address] [place], [place] [address] [phone] Overall rating Much below average 19. 14.5 [facility] [address] [place], [place] [address] [phone] Overall rating Much below average 20. [place], [place] [address] [phone] Overall rating Average 21. 15.6 mi [facility] [place] [address] [place], [place] [address] [phone] Overall rating Below average 22. 16.1 mi [place] at [place], [place] [address] [phone] Overall rating Much above average 23. 16.2 [place] [facility] [place], [place] [address] [phone] Overall rating Much above average 24. 16.2 mi [place],  [address] [phone] Overall rating Below average 25. 16.3 [place]  and [facility] [address] [place], [place] [address] [phone] Overall rating Much below average 26. 16.9 mi [place] [name] [place], [place] [address] [phone] Overall rating Above average 27. 18.3 mi Materials engineer at [employer] at [place], [place] [address] [phone] Overall rating Much above average 28. 19.7 mi [facility] [address] [place], [place] [address] [phone] Overall rating Much below average 29. 20.4 mi [facility] [address] [place], [place] [address] [phone] Overall rating Much below average 30. 20.6 [place], [place] [address] [phone] Overall rating Much above average 31. 20.9 mi [facility] [address] [place], [place] [address] [phone] Overall rating Below average 32. 20.9 mi No Name [place], [place] [address] [phone] Overall rating Much below average 33. 21.4 mi [facility] and [facility] [address] [place], [place] [address] [phone] Overall rating Much below average 34. 21.7 mi [facility] [place] [address] [place], [place] [address] [phone] Overall rating Above average 35. 21.8 [place], [place] [address] [phone] Overall rating Not available18 36. 23.2 mi [address] [place], [place] [address] [phone] Overall rating Below average 37. 23.3 mi [facility] [place], [place] [address] [phone] Overall rating Much below average 38. 23.6 [address] [place], [place] [address] [phone] Overall rating Much above average 39. 24.3 [facility] [address] [place], [place] [address] [phone] Overall rating Much below average 40. 24.7 mi [place] [facility] [place],  [address] [phone] Overall rating Average To explore and download nursing home data,visit the data c   Expected Discharge Plan: [place] Barriers to Discharge: Continued Medical Work up  Expected Discharge Plan and Services Expected Discharge Plan: [place]   Discharge Planning Services: CM Consult   Living arrangements for the past 2 months: Single Family Home                                       Social Determinants of Health (SDOH) Interventions    Readmission Risk Interventions     No data to display

## 2022-01-20 NOTE — Consult Note (Signed)
WOC Nurse Consult Note: Reason for Consult:mild irritant contact dermatitis (moisture associated skin damage) from incontinence Wound type:ICD  ICD-10 CM Codes for Irritant Dermatitis  L24A2 - Due to fecal, urinary or dual incontinence  Pressure Injury POA: N/A Measurement:N/A Wound bed:N/A Drainage (amount, consistency, odor) N/A Periwound:N/A Dressing procedure/placement/frequency:Turning and repositioning is in place and patient is on a pressure redistribution mattress with therapeutic, low friction coefficient (CoF) and moisture wicking bed linens (DermaTherapy). I have added three times daily application of Gerhart's Butt Cream, a compounded preparation of lotrimin:zinc oxide:hydrocortisone cream for 10 days to augment the POC.  [facility] nursing team will not follow, but will remain available to this patient, the nursing and medical teams.  Please re-consult if needed.  Thank you for inviting [name] to participate in this patient's Plan of Care.  Maudie Flakes, MSN, RN, CNS, [place], Serita Grammes, [employer], [employer] phone:  [phone]

## 2022-01-21 ENCOUNTER — Other Ambulatory Visit: Payer: Self-pay

## 2022-01-21 DIAGNOSIS — K255 Chronic or unspecified gastric ulcer with perforation: Secondary | ICD-10-CM | POA: Diagnosis not present

## 2022-01-21 LAB — CBC
HCT: 23.5 % — ABNORMAL LOW (ref 39.0–52.0)
Hemoglobin: 7.6 g/dL — ABNORMAL LOW (ref 13.0–17.0)
MCH: 30 pg (ref 26.0–34.0)
MCHC: 32.3 g/dL (ref 30.0–36.0)
MCV: 92.9 fL (ref 80.0–100.0)
Platelets: 345 10*3/uL (ref 150–400)
RBC: 2.53 MIL/uL — ABNORMAL LOW (ref 4.22–5.81)
RDW: 15.7 % — ABNORMAL HIGH (ref 11.5–15.5)
WBC: 13.4 10*3/uL — ABNORMAL HIGH (ref 4.0–10.5)
nRBC: 0 % (ref 0.0–0.2)

## 2022-01-21 MED ORDER — THIAMINE HCL 100 MG PO TABS: 100.0000 mg | ORAL_TABLET | Freq: Every day | ORAL | 0 refills | Status: DC

## 2022-01-21 MED ORDER — GERHARDT'S BUTT CREAM: 1.0000 | TOPICAL_CREAM | Freq: Three times a day (TID) | CUTANEOUS | Status: DC

## 2022-01-21 MED ORDER — FENTANYL 25 MCG/HR TD PT72: 1.0000 | MEDICATED_PATCH | TRANSDERMAL | 0 refills | Status: DC

## 2022-01-21 MED ORDER — DIPHENHYDRAMINE HCL 25 MG PO CAPS: 25.0000 mg | ORAL_CAPSULE | Freq: Every day | ORAL | 0 refills | Status: DC

## 2022-01-21 MED ORDER — HYDROMORPHONE HCL 2 MG PO TABS
2.0000 mg | ORAL_TABLET | ORAL | 0 refills | Status: DC | PRN
Start: 2022-01-21 — End: 2024-03-22

## 2022-01-21 MED ORDER — QUETIAPINE FUMARATE 50 MG PO TABS: 50.0000 mg | ORAL_TABLET | Freq: Every day | ORAL | 2 refills | Status: DC

## 2022-01-21 MED ORDER — METOPROLOL SUCCINATE ER 25 MG PO TB24: 12.5000 mg | ORAL_TABLET | Freq: Every day | ORAL | 2 refills | Status: DC

## 2022-01-21 MED ORDER — AMIODARONE HCL 200 MG PO TABS: 200.0000 mg | ORAL_TABLET | Freq: Every day | ORAL | 2 refills | Status: DC

## 2022-01-21 MED ORDER — PHENOL 1.4 % MT LIQD: 2.0000 | OROMUCOSAL | 0 refills | Status: DC | PRN

## 2022-01-21 MED ORDER — ONDANSETRON 4 MG PO TBDP: 4.0000 mg | ORAL_TABLET | Freq: Four times a day (QID) | ORAL | 0 refills | Status: DC | PRN

## 2022-01-21 MED ORDER — APIXABAN 5 MG PO TABS: 5.0000 mg | ORAL_TABLET | Freq: Two times a day (BID) | ORAL | 2 refills | Status: DC

## 2022-01-21 MED ORDER — ENSURE ENLIVE PO LIQD: 237.0000 mL | Freq: Three times a day (TID) | ORAL | 12 refills | Status: DC

## 2022-01-21 MED ORDER — PANTOPRAZOLE SODIUM 40 MG PO TBEC: 40.0000 mg | DELAYED_RELEASE_TABLET | Freq: Every day | ORAL | 0 refills | Status: DC

## 2022-01-21 NOTE — TOC Progression Note (Addendum)
Transition of Care [facility]) - Progression Note    Patient Details  Name: CHATHAM HOWINGTON MRN: [id number] Date of Birth: 11/29/1929  Transition of Care [facility]) CM/SW Contact  Tukker Byrns, Juliann Pulse, RN Phone Number: 01/21/2022, 2:20 PM  Clinical Narrative: Awaiting  additional info from [facility] facility from an insurance aspect if can accept.Updated dtr Jean-voiced understanding.Supv is assisting with info needed to complete process to asst the SNF facility.  -3p-per [facility] rep Micheline Chapman intake-unable to pull up patient's dob therefore unable to proceed for SNF- TOC supv notified to asst. -3:28p-Informed from Altha DOB [birth year]-admit notified. Patient will [facility] to [facility].    Expected Discharge Plan: [place] Barriers to Discharge: Continued Medical Work up  Expected Discharge Plan and Services Expected Discharge Plan: [facility]   Discharge Planning Services: CM Consult   Living arrangements for the past 2 months: Single Family Home Expected Discharge Date: 01/21/22                                     Social Determinants of Health (SDOH) Interventions    Readmission Risk Interventions     No data to display

## 2022-01-21 NOTE — Discharge Summary (Signed)
[facility]  Physician Discharge Summary   Patient ID: Dillon Smith MRN: [id number] DOB/AGE: 86/31/1931 86 y.o.  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 Needs follow-up with medical oncology, [name]   Home Health: SNF Equipment/Devices: None  CODE STATUS: DNR  DISCHARGE CONDITION: fair  Diet recommendation: Dysphagia 2 diet with thin liquids  INITIAL HISTORY: 88 y.o. male past medical history of chronic kidney disease stage III, paroxysmal atrial fibrillation on anticoagulation at home, retention history of gastric cancer was admitted by the surgical service as work-up revealed pneumoperitoneum due to gastric perforation. Was taken to the OR and is status post exploratory laparotomy and drainage of intra-abdominal abscess on 7/3.  Initially tried hospitalist was consulted for medical management.  Patient has stabilized.  General surgery requesting hospitalist to take over care.     Consultants: General surgery.  Cardiology.  Palliative care.  Previously seen by critical care medicine.   Procedures: Exploratory laparotomy with drainage of intra-abdominal abscess 7/3   HOSPITAL COURSE:   Pneumoperitoneum in the setting of gastric cancer perforation on chemotherapy: Status post surgical intervention with exploratory laparotomy and intra-abdominal abscesses on 7/3. General surgery has been following.  Patient pulled his core track feeding tube out on 7/19.  No plans for  reinsertion of feeding tube at this time.  Not a candidate for PEG tube due to his history of gastric cancer.  Palliative care is following. Seems to be tolerating a dysphagia 2 diet at this time. Pain seems to be adequately controlled.  He is on fentanyl patch.  Medical oncology was following.  Looks like patient was receiving neoadjuvant chemotherapy for his gastric cancer which he tolerated well.  Chemotherapy currently on hold.  Medical oncology to reevaluate treatment options in the outpatient setting.  Followed by [name]. Has had multiple loose stools likely due to combination of tube feedings as well as resumption of bowel function.  Seems to have improved.   General surgery has reevaluated him.  Seems to be stable from a surgical standpoint.  Wound care instructions as per general surgery.   Acute kidney injury on chronic kidney disease stage IIIa: Looks like baseline creatinine is between 1.3-1.7.  Presented with creatinine greater than 2.  Renal function has stabilized.      Acute on chronic systolic and diastolic CHF EF known to be 25% based on echo done during this hospitalization.  Down from 45% in 2021. Cardiology has been following.  Patient noted to be on metoprolol.  Diuretics on hold for now.   Due to borderline blood pressure patient is not on ARB or ACE inhibitor.  Elevated creatinine also precludes this for now. Cardiac status is stable.  Remains off of diuretics at this time.   Acute metabolic encephalopathy/hospital delirium Had episodes of agitation for several days.  Required mittens.  Was given Haldol.  Subsequently started on Seroquel which seems to be helping.  Mittens were removed.  Seems to be back to baseline.  Continue current dose of  Seroquel.   Persistent atrial fibrillation Cardiology was managing.  Noted to be on amiodarone and metoprolol.  Also on apixaban for anticoagulation. Heart rate is reasonably well controlled.   Essential hypertension: Blood  pressure is reasonably well controlled.   Hyperlipidemia: Should be able to resume statin at discharge  Hyperglycemia No diabetic medications noted on home medication list.  Elevated CBGs were likely due to tube feedings.  Now resolved.   Normocytic anemia Likely anemia of chronic disease.   Mild drop in hemoglobin is dilutional.  Noted to be stable this morning.  No overt blood loss.    Goals of care/ethics: Palliative care following.  Patient is noted to be DNR.  Prognosis is guarded.     Mild irritant contact dermatitis MASD.  Noted in the sacral area.  Seen by wound care.     Patient is stable.  Okay for discharge to SNF when bed is available.   PERTINENT LABS:  The results of significant diagnostics from this hospitalization (including imaging, microbiology, ancillary and laboratory) are listed below for reference.     Labs:   Basic Metabolic Panel: Recent Labs  Lab 01/15/22 0420 01/17/22 0401 01/18/22 0222 01/20/22 0312  NA 144 145 141 137  K 3.8 3.8 3.9 3.8  CL 109 112* 109 108  CO2 '29 26 26 23  '$ GLUCOSE 123* 91 83 79  BUN 44* 32* 28* 23  CREATININE 1.60* 1.29* 1.31* 1.46*  CALCIUM 8.4* 8.4* 8.4* 8.2*   Liver Function Tests: Recent Labs  Lab 01/17/22 0401  AST 20  ALT 28  ALKPHOS 65  BILITOT 0.4  PROT 6.9  ALBUMIN 1.8*    CBC: Recent Labs  Lab 01/17/22 0401 01/18/22 0222 01/20/22 0312 01/21/22 0331  WBC 10.6* 13.7* 12.4* 13.4*  HGB 8.4* 8.2* 7.6* 7.6*  HCT 26.2* 25.5* 22.6* 23.5*  MCV 95.3 94.4 92.6 92.9  PLT 387 389 335 345    BNP: BNP (last 3 results) Recent Labs    01/09/22 0506  BNP 482.5*     CBG: Recent Labs  Lab 01/18/22 0801 01/18/22 1139 01/18/22 1627 01/18/22 2043 01/19/22 2009  GLUCAP 78 100* 93 83 89     IMAGING STUDIES DG Abd 1 View  Result Date: 01/13/2022 CLINICAL DATA:  Feeding tube placement. EXAM: ABDOMEN - 1 VIEW COMPARISON:  01/11/2022 FINDINGS: Feeding tube remains in place with the tip in the  body of the stomach. No evidence of dilated bowel loops. IMPRESSION: Feeding tube tip remains in the body of the stomach. Electronically Signed   By: Marlaine Hind M.D.   On: 01/13/2022 10:57   DG Abd Portable 1V  Result Date: 01/11/2022 CLINICAL DATA:  Feeding tube placement EXAM: PORTABLE ABDOMEN - 1 VIEW COMPARISON:  01/07/2022 FINDINGS: Tip of enteric tube is seen in the antrum of the stomach. There are ring-like densities in the left epigastrium, possibly a stent. Bowel gas pattern in the upper abdomen is unremarkable. Tip of central venous catheter is seen in superior vena cava close to the right atrium. IMPRESSION: Tip of feeding tube is seen in the antrum of the stomach. Electronically Signed   By: Elmer Picker M.D.   On: 01/11/2022 16:18   DG UGI W SINGLE CM (SOL OR THIN BA)  Result Date: 01/07/2022 CLINICAL DATA:  Pneumoperitoneum in the setting of known gastric cancer on chemotherapy. Status post ex lap drainage of intra-abdominal abscess. EXAM: UPPER GI SERIES WITH KUB TECHNIQUE: A water-soluble contrast upper GI series was attempted. The  examination was attempted by Rowe Robert, PA-C, and was supervised and interpreted by Dr. Kellie Simmering. FLUOROSCOPY: Fluoroscopy time: 42 seconds (12.9 mGy). COMPARISON:  CTA abdomen/pelvis 12/31/2021. FINDINGS: Prior to the procedure, a scout radiograph of the abdomen was obtained. Bilateral surgical drains. Gastroduodenal bypass device. No dilated loops of small bowel. Gaseous distention of the colon. Degenerative changes of the spine and bilateral femoroacetabular joints. Subsequently, a water-soluble contrast upper GI series was attempted. However, the patient was unable to swallow the contrast and the examination was terminated. IMPRESSION: Attempted water-soluble contrast upper GI series, as described. The patient was unable to swallow the contrast, and the examination was terminated. Electronically Signed   By: Kellie Simmering D.O.   On: 01/07/2022  12:57   DG Chest Port 1 View  Result Date: 01/04/2022 CLINICAL DATA:  Status post PICC placement EXAM: PORTABLE CHEST 1 VIEW COMPARISON:  12/31/2021 chest radiograph. FINDINGS: Left PICC terminates at the cavoatrial junction. Left internal jugular central venous catheter terminates in the upper third of the SVC. Right internal jugular Port-A-Cath terminates at the cavoatrial junction. Intact sternotomy wires. Enteric tube enters stomach with the tip not seen on this image. Stable cardiomediastinal silhouette with mild cardiomegaly. No pneumothorax. Left retrocardiac opacity and small left pleural effusion, new. No right pleural effusion. No pulmonary edema. IMPRESSION: 1. Left PICC terminates at the cavoatrial junction. 2. New small left pleural effusion. New left retrocardiac opacity, favor atelectasis. 3. Stable mild cardiomegaly.  No pulmonary edema. Electronically Signed   By: Ilona Sorrel M.D.   On: 01/04/2022 16:40   [name] EKG SITE RITE  Result Date: 01/04/2022 If Site Rite image not attached, placement could not be confirmed due to current cardiac rhythm.  ECHOCARDIOGRAM COMPLETE  Result Date: 01/03/2022    ECHOCARDIOGRAM REPORT   Patient Name:   KENTRELL HALLAHAN Date of Exam: 01/03/2022 Medical Rec #:  [id number]        Height:       71.0 in Accession #:    [id number]       Weight:       186.7 lb Date of Birth:  12/29/1929        BSA:          2.048 m Patient Age:    64 years         BP:           133/74 mmHg Patient Gender: M                HR:           97 bpm. Exam Location:  Inpatient Procedure: 2D Echo, Cardiac Doppler, Color Doppler and Strain Analysis Indications:    dyspnea  History:        Patient has prior history of Echocardiogram examinations, most                 recent 08/16/2019. CAD, Arrythmias:Atrial Fibrillation; Risk                 Factors:Hypertension.  Sonographer:    Joette Catching RCS Referring Phys: [id number] Hortencia Conradi MEIER  Sonographer Comments: Global longitudinal strain was  attempted. IMPRESSIONS  1. Left ventricular ejection fraction, by estimation, is 25 to 30%. The left ventricle has severely decreased function. The left ventricle demonstrates global hypokinesis. Left ventricular diastolic parameters are consistent with Grade II diastolic dysfunction (pseudonormalization). The average left ventricular global longitudinal strain is -8.0 %. The global longitudinal strain is abnormal.  2. Right ventricular  systolic function is mildly reduced. The right ventricular size is moderately enlarged. There is mildly elevated pulmonary artery systolic pressure. The estimated right ventricular systolic pressure is 50.9 mmHg.  3. Left atrial size was severely dilated.  4. Right atrial size was moderately dilated.  5. The mitral valve is normal in structure. Moderate mitral valve regurgitation. No evidence of mitral stenosis.  6. Tricuspid valve regurgitation is moderate.  7. The aortic valve is tricuspid. There is mild calcification of the aortic valve. There is mild thickening of the aortic valve. Aortic valve regurgitation is severe. Aortic valve sclerosis is present, with no evidence of aortic valve stenosis.  8. The inferior vena cava is normal in size with greater than 50% respiratory variability, suggesting right atrial pressure of 3 mmHg. Comparison(s): Prior images reviewed side by side. FINDINGS  Left Ventricle: Left ventricular ejection fraction, by estimation, is 25 to 30%. The left ventricle has severely decreased function. The left ventricle demonstrates global hypokinesis. The average left ventricular global longitudinal strain is -8.0 %. The global longitudinal strain is abnormal. The left ventricular internal cavity size was normal in size. There is no left ventricular hypertrophy. Left ventricular diastolic parameters are consistent with Grade II diastolic dysfunction (pseudonormalization).  LV Wall Scoring: The mid anteroseptal segment and mid inferoseptal segment are akinetic.  Right Ventricle: The right ventricular size is moderately enlarged. No increase in right ventricular wall thickness. Right ventricular systolic function is mildly reduced. There is mildly elevated pulmonary artery systolic pressure. The tricuspid regurgitant velocity is 3.06 m/s, and with an assumed right atrial pressure of 3 mmHg, the estimated right ventricular systolic pressure is 32.6 mmHg. Left Atrium: Left atrial size was severely dilated. Right Atrium: Right atrial size was moderately dilated. Pericardium: There is no evidence of pericardial effusion. Mitral Valve: The mitral valve is normal in structure. Moderate mitral valve regurgitation. No evidence of mitral valve stenosis. Tricuspid Valve: The tricuspid valve is normal in structure. Tricuspid valve regurgitation is moderate . No evidence of tricuspid stenosis. Aortic Valve: The aortic valve is tricuspid. There is mild calcification of the aortic valve. There is mild thickening of the aortic valve. Aortic valve regurgitation is severe. Aortic regurgitation PHT measures 648 msec. Aortic valve sclerosis is present, with no evidence of aortic valve stenosis. Aortic valve mean gradient measures 3.5 mmHg. Aortic valve peak gradient measures 6.3 mmHg. Aortic valve area, by VTI measures 2.74 cm. Pulmonic Valve: The pulmonic valve was normal in structure. Pulmonic valve regurgitation is trivial. No evidence of pulmonic stenosis. Aorta: The aortic root is normal in size and structure. Venous: The inferior vena cava is normal in size with greater than 50% respiratory variability, suggesting right atrial pressure of 3 mmHg. IAS/Shunts: No atrial level shunt detected by color flow Doppler.  LEFT VENTRICLE PLAX 2D LVIDd:         5.90 cm      Diastology LVIDs:         4.90 cm      LV e' medial:    7.72 cm/s LV PW:         1.00 cm      LV E/e' medial:  11.5 LV IVS:        1.00 cm      LV e' lateral:   12.90 cm/s LVOT diam:     2.20 cm      LV E/e' lateral: 6.9 LV SV:          50 LV SV Index:  25           2D Longitudinal Strain LVOT Area:     3.80 cm     2D Strain GLS Avg:     -8.0 %  LV Volumes (MOD) LV vol d, MOD A2C: 138.0 ml LV vol d, MOD A4C: 181.0 ml LV vol s, MOD A2C: 127.0 ml LV vol s, MOD A4C: 95.1 ml LV SV MOD A2C:     11.0 ml LV SV MOD A4C:     181.0 ml LV SV MOD BP:      46.3 ml RIGHT VENTRICLE RV Basal diam:  5.45 cm RV Mid diam:    3.70 cm RV S prime:     14.80 cm/s TAPSE (M-mode): 1.4 cm LEFT ATRIUM              Index        RIGHT ATRIUM           Index LA diam:        5.10 cm  2.49 cm/m   RA Area:     23.70 cm LA Vol (A2C):   156.0 ml 76.17 ml/m  RA Volume:   84.20 ml  41.11 ml/m LA Vol (A4C):   114.0 ml 55.67 ml/m LA Biplane Vol: 141.0 ml 68.85 ml/m  AORTIC VALVE                    PULMONIC VALVE AV Area (Vmax):    2.69 cm     PV Vmax:          0.80 m/s AV Area (Vmean):   2.79 cm     PV Peak grad:     2.5 mmHg AV Area (VTI):     2.74 cm     PR End Diast Vel: 10.98 msec AV Vmax:           125.10 cm/s AV Vmean:          79.200 cm/s AV VTI:            0.183 m AV Peak Grad:      6.3 mmHg AV Mean Grad:      3.5 mmHg LVOT Vmax:         88.55 cm/s LVOT Vmean:        58.050 cm/s LVOT VTI:          0.132 m LVOT/AV VTI ratio: 0.72 AI PHT:            648 msec  AORTA Ao Root diam: 3.90 cm Ao Asc diam:  3.90 cm MITRAL VALVE                  TRICUSPID VALVE MV Area (PHT): 4.06 cm       TR Peak grad:   37.5 mmHg MV Decel Time: 187 msec       TR Vmax:        306.00 cm/s MR Peak grad:    112.4 mmHg MR Mean grad:    68.0 mmHg    SHUNTS MR Vmax:         530.00 cm/s  Systemic VTI:  0.13 m MR Vmean:        388.0 cm/s   Systemic Diam: 2.20 cm MR PISA:         0.57 cm MR PISA Eff ROA: 4 mm MR PISA Radius:  0.30 cm MV E velocity: 88.70 cm/s MV A velocity: 29.10 cm/s MV E/A ratio:  3.05 Candee Furbish MD Electronically  signed by Candee Furbish MD Signature Date/Time: 01/03/2022/1:04:41 PM    Final    DG Abd 1 View  Result Date: 01/01/2022 CLINICAL DATA:  Feeding tube placement.  EXAM: ABDOMEN - 1 VIEW COMPARISON:  Radiograph yesterday. FINDINGS: The enteric tube has been advanced, tip and side-port are below the diaphragm. Abdominal drains remain in place. IMPRESSION: Enteric tube with tip and side-port below the diaphragm in the stomach. Electronically Signed   By: Keith Rake M.D.   On: 01/01/2022 00:52   DG Abd 1 View  Result Date: 01/01/2022 CLINICAL DATA:  Feeding tube placement EXAM: ABDOMEN - 1 VIEW COMPARISON:  CT 12/31/2021 FINDINGS: Esophageal tube tip overlies the stomach, side-port near the GE junction. Probable drains over the upper abdomen. Faintly visible gastrojejunal stent. Upper gas pattern is unobstructed. IMPRESSION: Esophageal tube tip overlies stomach, side-port in the region of GE junction, further advancement could be considered for more optimal positioning Electronically Signed   By: Donavan Foil M.D.   On: 01/01/2022 00:11   CT Angio Abd/Pel W and/or Wo Contrast  Result Date: 12/31/2021 CLINICAL DATA:  Mesenteric ischemia, abdominal pain EXAM: CTA ABDOMEN AND PELVIS WITHOUT AND WITH CONTRAST TECHNIQUE: Multidetector CT imaging of the abdomen and pelvis was performed using the standard protocol during bolus administration of intravenous contrast. Multiplanar reconstructed images and MIPs were obtained and reviewed to evaluate the vascular anatomy. RADIATION DOSE REDUCTION: This exam was performed according to the departmental dose-optimization program which includes automated exposure control, adjustment of the mA and/or kV according to patient size and/or use of iterative reconstruction technique. CONTRAST:  70m OMNIPAQUE IOHEXOL 350 MG/ML SOLN COMPARISON:  CT 12/22/2021 FINDINGS: VASCULAR Aorta: Moderate calcified atheromatous plaque throughout. No dissection or stenosis. Fusiform 3.7 cm infrarenal aneurysm tapering to normal caliber above the bifurcation nonocclusive mural thrombus in the aneurysmal segment. Celiac: Calcified ostial plaque resulting  in short segment stenosis of at least mild hemodynamic significance. Branch vessels unremarkable. SMA: Calcified ostial plaque resulting in short segment stenosis of at least mild severity, mildly atheromatous but patent distally. Renals: Single left, with partially calcified ostial plaque resulting in short segment stenosis of at least mild severity, patent distally. Single right, mildly atheromatous but patent. IMA: Patent, arising from the distal aspect of the aneurysmal segment. Inflow: Fusiform dilatation of distal right common iliac artery up to 2 cm diameter, left 1.4 cm. Moderate scattered calcified atheromatous plaque throughout bilateral iliac arterial systems. Proximal Outflow: Atheromatous, patent Veins: Patent hepatic veins, portal vein, SMV, splenic vein, bilateral renal veins. No mesenteric or portal venous gas. Review of the MIP images confirms the above findings. NON-VASCULAR Lower chest: No pleural or pericardial effusion. Minimal interstitial infiltrate or dependent atelectasis posteriorly at the right lung base. Sternotomy wires. Central venous catheter extends to the proximal right atrium. Hepatobiliary: 1.4 cm hyperdense lesion in hepatic segment 5. Gallbladder nondistended. No calcified gallstones. No biliary ductal dilatation. Pancreas: Unremarkable. No pancreatic ductal dilatation or surrounding inflammatory changes. Spleen: Normal in size without focal abnormality. Adrenals/Urinary Tract: 3.2 cm enhancing left adrenal mass, stable. Bilateral renal cysts stable. No hydronephrosis. Urinary bladder physiologically distended. Stomach/Bowel: Stomach is decompressed. Patent gastroduodenal bypass device. Poorly marginated 3.6 cm enhancing antral mass. The duodenum is decompressed. Small bowel is nondilated. Normal appendix. The colon is incompletely distended, unremarkable. No pneumatosis. No mesenteric venous gas. Lymphatic: No abdominal or pelvic adenopathy. Reproductive: Metallic seeds  throughout the prostate. Other: Small volume abdominal and pelvic ascites. Scattered free air in the anterior upper abdomen. Musculoskeletal: Spondylitic  changes in the lower lumbar spine. IMPRESSION: 1. Positive for free intraperitoneal air, consistent with perforated viscus, site indeterminate. Critical Value/emergent results were called by telephone at the time of interpretation on 12/31/2021 at 1:57 pm to provider [facility] , who verbally acknowledged these results. 2. 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 3. 3.7 cm infrarenal abdominal aortic aneurysm. Consensus guidelines recommend follow-up ultrasound every 2 years. Reference: Joellyn Rued Radiol 2013; 10:789-794. Electronically Signed   By: Lucrezia Europe M.D.   On: 12/31/2021 14:03   DG Chest Portable 1 View  Result Date: 12/31/2021 CLINICAL DATA:  Provided history: Epigastric pain, rule out infection. EXAM: PORTABLE CHEST 1 VIEW COMPARISON:  Chest radiograph 09/12/2021 (images available, report unavailable). FINDINGS: Right chest infusion port catheter with tip projecting at the level of the superior cavoatrial junction. Prior median sternotomy/CABG. Heart size at the upper limits of normal. Heart size at the upper limits of normal. No appreciable airspace consolidation or pulmonary edema. No evidence of pleural effusion or pneumothorax. No acute bony abnormality identified. IMPRESSION: Right chest infusion port catheter with tip at the level of the superior cavoatrial junction. No evidence of acute cardiopulmonary abnormality. Aortic Atherosclerosis (ICD10-I70.0). Electronically Signed   By: Kellie Simmering D.O.   On: 12/31/2021 11:58   CT ABDOMEN PELVIS W CONTRAST  Result Date: 12/22/2021 CLINICAL DATA:  Decreased bowel movements over the past 2 days, history of gastric cancer, possible obstructive change. EXAM: CT ABDOMEN AND PELVIS WITH CONTRAST TECHNIQUE: Multidetector CT imaging of the abdomen and pelvis was  performed using the standard protocol following bolus administration of intravenous contrast. RADIATION DOSE REDUCTION: This exam was performed according to the departmental dose-optimization program which includes automated exposure control, adjustment of the mA and/or kV according to patient size and/or use of iterative reconstruction technique. CONTRAST:  58m OMNIPAQUE IOHEXOL 300 MG/ML  SOLN COMPARISON:  10/18/2021 FINDINGS: Lower chest: No acute abnormality. Hepatobiliary: No focal liver abnormality is seen. No gallstones, gallbladder wall thickening, or biliary dilatation. Pancreas: Unremarkable. No pancreatic ductal dilatation or surrounding inflammatory changes. Spleen: Normal in size without focal abnormality. Adrenals/Urinary Tract: Right adrenal gland is within normal limits. Left adrenal gland again demonstrates a mixed attenuation lesion measuring up to 2.8 cm stable from recent PET-CT. This showed no hypermetabolic activity on prior CT. Kidneys demonstrate no renal calculi or obstructive changes. Multiple benign appearing simple cysts are noted. The largest of these is noted on the left measuring 6.5 cm. No further follow-up is recommended. The bladder is well distended. Stomach/Bowel: Scattered fecal material is noted throughout the colon. No obstructive or inflammatory changes are seen. The appendix is within normal limits without inflammatory change. Small bowel is unremarkable. The stomach is well distended with fluid. A gastrojejunostomy stent is noted in satisfactory position and appears widely patent. The distal aspect of the stomach demonstrates enhancement and wall thickening consistent with the known history of gastric antral cancer. Vascular/Lymphatic: Aortic atherosclerosis. No enlarged abdominal or pelvic lymph nodes. Dilatation of the infrarenal aorta is noted to 3.7 cm stable in appearance from prior PET-CT. Reproductive: Prostate brachytherapy seeds are noted. Other: No abdominal wall  hernia or abnormality. No abdominopelvic ascites. Musculoskeletal: No acute or significant osseous findings. IMPRESSION: Stable appearing obstructing distal gastric lesion with stent based gastrojejunostomy which appears widely patent. 3.7 cm infrarenal abdominal aortic aneurysm. Recommend follow-up every 2 years. Reference: J Am Coll Radiol 22683;41:962-229 2.8 cm left adrenal lesion stable from the prior PET-CT. No  increased metabolic activity is noted on prior PET-CT. No new focal abnormality is noted. Electronically Signed   By: Inez Catalina M.D.   On: 12/22/2021 19:17    DISCHARGE EXAMINATION: Vitals:   01/20/22 1234 01/20/22 1959 01/21/22 0350 01/21/22 0955  BP: 115/79 121/65 (!) 116/59 120/60  Pulse: 81 77 81 80  Resp: '16 14 14   '$ Temp: 98.2 F (36.8 C) 99.6 F (37.6 C) 98.8 F (37.1 C)   TempSrc: Oral Oral Oral   SpO2: 100% 100% 100%   Weight:   79 kg   Height:       See progress note from earlier today  DISPOSITION: SNF  Discharge Instructions     Call MD for:  difficulty breathing, headache or visual disturbances   Complete by: As directed    Call MD for:  extreme fatigue   Complete by: As directed    Call MD for:  hives   Complete by: As directed    Call MD for:  persistant dizziness or light-headedness   Complete by: As directed    Call MD for:  persistant nausea and vomiting   Complete by: As directed    Call MD for:  redness, tenderness, or signs of infection (pain, swelling, redness, odor or green/yellow discharge around incision site)   Complete by: As directed    Call MD for:  severe uncontrolled pain   Complete by: As directed    Call MD for:  temperature >100.4   Complete by: As directed    Discharge instructions   Complete by: As directed    Please review instructions on the discharge summary.  You were cared for by a hospitalist during your hospital stay. If you have any questions about your discharge medications or the care you received while you were  in the hospital after you are discharged, you can call the unit and asked to speak with the hospitalist on call if the hospitalist that took care of you is not available. Once you are discharged, your primary care physician will handle any further medical issues. Please note that NO REFILLS for any discharge medications will be authorized once you are discharged, as it is imperative that you return to your primary care physician (or establish a relationship with a primary care physician if you do not have one) for your aftercare needs so that they can reassess your need for medications and monitor your lab values. If you do not have a primary care physician, you can call [phone] for a physician referral.   Discharge wound care:   Complete by: As directed    Please review instructions provided by general surgery.   Discharge wound care:   Complete by: As directed    Once daily saline wet to dry dressing to midline wound.   Increase activity slowly   Complete by: As directed           Allergies as of 01/21/2022       Reactions   Penicillins Itching        Medication List     STOP taking these medications    amLODipine 10 MG tablet Commonly known as: NORVASC   capecitabine 500 MG tablet Commonly known as: XELODA   glycerin adult 2 g suppository   lidocaine-prilocaine cream Commonly known as: EMLA   metoprolol tartrate 50 MG tablet Commonly known as: LOPRESSOR   ondansetron 8 MG tablet Commonly known as: Zofran   prochlorperazine 10 MG tablet Commonly known as: COMPAZINE  TAKE these medications    amiodarone 200 MG tablet Commonly known as: PACERONE Take 1 tablet (200 mg total) by mouth daily. Start taking on: January 22, 2022   apixaban 5 MG Tabs tablet Commonly known as: ELIQUIS Take 1 tablet (5 mg total) by mouth 2 (two) times daily. What changed: how much to take   atorvastatin 40 MG tablet Commonly known as: LIPITOR Take 1 tablet (40 mg total) by  mouth at bedtime.   diphenhydrAMINE 25 mg capsule Commonly known as: BENADRYL Take 1 capsule (25 mg total) by mouth at bedtime.   feeding supplement Liqd Take 237 mLs by mouth 3 (three) times daily with meals.   fentaNYL 25 MCG/HR Commonly known as: [place] 1 patch onto the skin every 3 (three) days.   Gerhardt's butt cream Crea Apply 1 Application topically 3 (three) times daily.   HYDROmorphone 2 MG tablet Commonly known as: DILAUDID Take 1 tablet (2 mg total) by mouth every 4 (four) hours as needed for severe pain.   metoprolol succinate 25 MG 24 hr tablet Commonly known as: TOPROL-XL Take 0.5 tablets (12.5 mg total) by mouth daily. Start taking on: January 22, 2022   MULTI VITAMIN DAILY PO Take 0.5 tablets by mouth daily.   ondansetron 4 MG disintegrating tablet Commonly known as: ZOFRAN-ODT Take 1 tablet (4 mg total) by mouth every 6 (six) hours as needed for nausea.   pantoprazole 40 MG tablet Commonly known as: PROTONIX Take 1 tablet (40 mg total) by mouth at bedtime.   phenol 1.4 % Liqd Commonly known as: CHLORASEPTIC Use as directed 2 sprays in the mouth or throat every 2 (two) hours as needed for throat irritation / pain.   QUEtiapine 50 MG tablet Commonly known as: SEROQUEL Take 1 tablet (50 mg total) by mouth at bedtime.   thiamine 100 MG tablet Take 1 tablet (100 mg total) by mouth daily. Start taking on: January 22, 2022               Discharge Care Instructions  (From admission, onward)           Start     Ordered   01/21/22 0000  Discharge wound care:       Comments: Please review instructions provided by general surgery.   01/21/22 1049   01/21/22 0000  Discharge wound care:       Comments: Once daily saline wet to dry dressing to midline wound.   01/21/22 1114              Contact information for follow-up providers     Dwan Bolt, MD. Schedule an appointment as soon as possible for a visit.   Specialty: General  Surgery Why: to follow up regarding gastric cancer once home from rehab. Contact information: [place] [place] [address] [phone]              Contact information for after-discharge care     Destination     [facility] .   Service: Skilled Nursing Contact information: [address] [place] [phone]                     TOTAL DISCHARGE TIME: 38 minutes  [facility] Pager on [email or web address]  01/21/2022, 1:06 PM

## 2022-01-21 NOTE — Progress Notes (Signed)
Physical Therapy Treatment Patient Details Name: Dillon Smith MRN: [id number] DOB: 11/29/1929 Today's Date: 01/21/2022   History of Present Illness 88 yo male admitted with gastric perforation. S/P ex lap, drainage of abscess. Hx of gastric ca, ckd, bradycardia, covid, prostate ca, cva, afib.    PT Comments    Session focused on sit to stand transitions with RW to work on technique, strengthening, and activity tolerance. Pt participated well but he remains weak. Bil knee buckling with each standing trial. He tolerated activity well. He was surprised at how weak he is. Continue to recommend ST SNF rehab     Recommendations for follow up therapy are one component of a multi-disciplinary discharge planning process, led by the attending physician.  Recommendations may be updated based on patient status, additional functional criteria and insurance authorization.  Follow Up Recommendations  Skilled nursing-short term rehab (<3 hours/day) Can patient physically be transported by private vehicle: No   Assistance Recommended at Discharge Frequent or constant Supervision/Assistance  Patient can return home with the following Two people to help with walking and/or transfers;Two people to help with bathing/dressing/bathroom;Assist for transportation;Help with stairs or ramp for entrance;Assistance with cooking/housework   Equipment Recommendations  None recommended by PT    Recommendations for Other Services       Precautions / Restrictions Precautions Precautions: Fall Precaution Comments: Abd surgery; significant fall risk Restrictions Weight Bearing Restrictions: No     Mobility  Bed Mobility Overal bed mobility: Needs Assistance Bed Mobility: Supine to Sit, Sit to Supine     Supine to sit: Max assist, HOB elevated Sit to supine: Mod assist, HOB elevated   General bed mobility comments: Assist for trunk and LEs. Utilized bedpad for scooting, positioning. Increased time. Cues  provided.    Transfers Overall transfer level: Needs assistance Equipment used: Rolling walker (2 wheels) Transfers: Sit to/from Stand Sit to Stand: Mod assist, +2 physical assistance, +2 safety/equipment, From elevated surface           General transfer comment: Sit to stand x 3 with RW-pt able to stand for at least 15 seconds each time. Cues for safety, proper use of RW, posture. Intermittent knee buckling occurs. High fall risk. Lateral scoot towards HOB with Min A.    Ambulation/Gait                   Stairs             Wheelchair Mobility    Modified Rankin (Stroke Patients Only)       Balance Overall balance assessment: Needs assistance   Sitting balance-Leahy Scale: Fair     Standing balance support: During functional activity, Reliant on assistive device for balance Standing balance-Leahy Scale: Poor                              Cognition Arousal/Alertness: Awake/alert Behavior During Therapy: WFL for tasks assessed/performed Overall Cognitive Status: Impaired/Different from baseline Area of Impairment: Problem solving                       Following Commands: Follows one step commands with increased time     Problem Solving: Slow processing, Requires verbal cues, Requires tactile cues          Exercises General Exercises - Lower Extremity Quad Sets: AROM, Both, 5 reps Heel Slides: AROM, Both, 5 reps    General Comments  Pertinent Vitals/Pain Pain Assessment Pain Assessment: Faces Faces Pain Scale: No hurt    Home Living                          Prior Function            PT Goals (current goals can now be found in the care plan section) Progress towards PT goals: Progressing toward goals    Frequency    Min 2X/week      PT Plan Current plan remains appropriate    Co-evaluation              AM-PAC PT "6 Clicks" Mobility   Outcome Measure  Help needed turning from  your back to your side while in a flat bed without using bedrails?: A Lot Help needed moving from lying on your back to sitting on the side of a flat bed without using bedrails?: A Lot Help needed moving to and from a bed to a chair (including a wheelchair)?: A Lot Help needed standing up from a chair using your arms (e.g., wheelchair or bedside chair)?: A Lot Help needed to walk in hospital room?: Total Help needed climbing 3-5 steps with a railing? : Total 6 Click Score: 10    End of Session Equipment Utilized During Treatment: Gait belt Activity Tolerance: Patient tolerated treatment well Patient left: in bed;with call bell/phone within reach;with bed alarm set   PT Visit Diagnosis: Muscle weakness (generalized) (M62.81);Difficulty in walking, not elsewhere classified (R26.2)     Time: 0459-9774 PT Time Calculation (min) (ACUTE ONLY): 17 min  Charges:  $Therapeutic Exercise: 8-22 mins                        Doreatha Massed, PT Acute Rehabilitation  Office: [phone] Pager: [phone]

## 2022-01-21 NOTE — Progress Notes (Signed)
Progress Note  21 Days Post-Op  Subjective: Pt sleeping this AM, calm. Tolerating diet. No mittens on. Denies abdominal pain   Objective: Vital signs in last 24 hours: Temp:  [98.2 F (36.8 C)-99.6 F (37.6 C)] 98.8 F (37.1 C) (07/24 0350) Pulse Rate:  [77-81] 80 (07/24 0955) Resp:  [14-16] 14 (07/24 0350) BP: (115-121)/(59-79) 120/60 (07/24 0955) SpO2:  [100 %] 100 % (07/24 0350) Weight:  [79 kg] 79 kg (07/24 0350) Last BM Date : 01/19/22  Intake/Output from previous day: 07/23 0701 - 07/24 0700 In: 480 [P.O.:480] Out: 1100 [Urine:1100] Intake/Output this shift: Total I/O In: 140 [P.O.:140] Out: -   PE: General: pleasant, WD male who is laying in bed in NAD Heart: regular, rate, and rhythm.  Lungs: CTAB, no wheezes, rhonchi, or rales noted.  Respiratory effort nonlabored Abd: soft, NT, ND, +BS, midline wound clean    Lab Results:  Recent Labs    01/20/22 0312 01/21/22 0331  WBC 12.4* 13.4*  HGB 7.6* 7.6*  HCT 22.6* 23.5*  PLT 335 345    BMET Recent Labs    01/20/22 0312  NA 137  K 3.8  CL 108  CO2 23  GLUCOSE 79  BUN 23  CREATININE 1.46*  CALCIUM 8.2*    PT/INR No results for input(s): "LABPROT", "INR" in the last 72 hours. CMP     Component Value Date/Time   NA 137 01/20/2022 0312   NA 143 02/06/2021 0845   K 3.8 01/20/2022 0312   CL 108 01/20/2022 0312   CO2 23 01/20/2022 0312   GLUCOSE 79 01/20/2022 0312   BUN 23 01/20/2022 0312   BUN 23 02/06/2021 0845   CREATININE 1.46 (H) 01/20/2022 0312   CREATININE 1.57 (H) 12/13/2021 0827   CALCIUM 8.2 (L) 01/20/2022 0312   PROT 6.9 01/17/2022 0401   PROT 6.4 02/06/2021 0845   ALBUMIN 1.8 (L) 01/17/2022 0401   ALBUMIN 3.9 02/06/2021 0845   AST 20 01/17/2022 0401   AST 18 12/13/2021 0827   ALT 28 01/17/2022 0401   ALT 17 12/13/2021 0827   ALKPHOS 65 01/17/2022 0401   BILITOT 0.4 01/17/2022 0401   BILITOT 0.5 12/13/2021 0827   GFRNONAA 45 (L) 01/20/2022 0312   GFRNONAA 42 (L)  12/13/2021 0827   GFRAA 43 (L) 07/06/2020 1156   Lipase     Component Value Date/Time   LIPASE 35 12/31/2021 0941       Studies/Results: No results found.  Anti-infectives: Anti-infectives (From admission, onward)    Start     Dose/Rate Route Frequency Ordered Stop   01/04/22 1200  piperacillin-tazobactam (ZOSYN) IVPB 3.375 g  Status:  Discontinued        3.375 g 12.5 mL/hr over 240 Minutes Intravenous Every 8 hours 01/04/22 0729 01/08/22 1116   01/02/22 1400  piperacillin-tazobactam (ZOSYN) IVPB 2.25 g  Status:  Discontinued        2.25 g 100 mL/hr over 30 Minutes Intravenous Every 8 hours 01/02/22 0718 01/04/22 0728   01/01/22 2000  vancomycin (VANCOCIN) IVPB 1000 mg/200 mL premix  Status:  Discontinued        1,000 mg 200 mL/hr over 60 Minutes Intravenous Every 24 hours 12/31/21 1820 01/01/22 0824   12/31/21 2000  piperacillin-tazobactam (ZOSYN) IVPB 3.375 g  Status:  Discontinued        3.375 g 12.5 mL/hr over 240 Minutes Intravenous Every 8 hours 12/31/21 1821 01/02/22 0718   12/31/21 1130  vancomycin (VANCOREADY) IVPB 1750 mg/350 mL  1,750 mg 175 mL/hr over 120 Minutes Intravenous  Once 12/31/21 1124 12/31/21 1411   12/31/21 1130  ceFEPIme (MAXIPIME) 2 g in sodium chloride 0.9 % 100 mL IVPB        2 g 200 mL/hr over 30 Minutes Intravenous  Once 12/31/21 1124 12/31/21 1226        Assessment/Plan Pneumoperitoneum in the setting of known gastric cancer on chemotherapy POD #21 status post ex lap drainage of intra-abdominal abscess 7/3 Dr. Marlou Starks  - Once daily wet to dry dressing to midline wound - drains out - cortrak out, recommend leaving out and encouraging PO intake - not a candidate for placement of PEG/gastrostomy/j-tube given known gastric cancer with recent perforation  - at this point patient is surgically stable and this is really more of a placement issue - TOC working on this  - continue therapies  - surgically stable for discharge, will ensure  surgical follow up is in AVS  FEN: D2 diet, ensure VTE: eliquis BID ID: currently off abx  - below per TRH -  CKD stage III Hx of CVA A. Fib on eliquis HTN HLD CAD Hx of prostate cancer    LOS: 21 days     Norm Parcel, [facility] Surgery 01/21/2022, 10:44 AM Please see Amion for pager number during day hours 7:00am-4:30pm

## 2022-01-21 NOTE — Progress Notes (Signed)
[facility] PROGRESS NOTE   Dillon Smith VOZ:[id number] DOB: 06-24-1930 DOA: 12/31/2021  PCP: [name]  Brief History/Interval Summary: 88 y.o. male past medical history of chronic kidney disease stage III, paroxysmal atrial fibrillation on anticoagulation at home, retention history of gastric cancer was admitted by the surgical service as work-up revealed pneumoperitoneum due to gastric perforation. Was taken to the OR and is status post exploratory laparotomy and drainage of intra-abdominal abscess on 7/3.  Initially tried hospitalist was consulted for medical management.  Patient has stabilized.  General surgery requesting hospitalist to take over care.    Consultants: General surgery.  Cardiology.  Palliative care.  Previously seen by critical care medicine.  Procedures: Exploratory laparotomy with drainage of intra-abdominal abscess 7/3    Subjective/Interval History: Patient denies any complaints.  Wondering when he can go home.  He was told that he needed to" for rehab.    Assessment/Plan:  Pneumoperitoneum in the setting of gastric cancer perforation on chemotherapy: Status post surgical intervention with exploratory laparotomy and intra-abdominal abscesses on 7/3. General surgery has been following.  Patient pulled his core track feeding tube out on 7/19.  Currently on a dysphagia 1 diet.  No plans for reinsertion of feeding tube at this time.  Not a candidate for PEG tube due to his history of gastric cancer.  Palliative care is following. Pain seems to be adequately controlled.  He is noted to be on fentanyl patch.  Medical oncology was following.  Looks like patient was receiving neoadjuvant chemotherapy for his gastric cancer which he tolerated well.  Chemotherapy currently on hold.  Medical oncology to reevaluate treatment options in the outpatient setting.  Followed by [name]. Has had multiple loose stools likely due to combination of tube feedings  as well as resumption of bowel function.  Seems to have improved.   General surgery has reevaluated him.  They replaced will get instructions of the discharge instruction section.  Seems to be stable from a surgical standpoint.  Acute kidney injury on chronic kidney disease stage IIIa: Looks like baseline creatinine is between 1.3-1.7.  Presented with creatinine greater than 2.  Renal function has stabilized.  Continue to monitor urine output.     Acute on chronic systolic and diastolic CHF EF known to be 25% based on echo done during this hospitalization.  Down from 45% in 2021. Cardiology has been following.  Patient noted to be on metoprolol.  Diuretics on hold for now.   Due to borderline blood pressure patient is not on ARB or ACE inhibitor.  Elevated creatinine also precludes this for now. Cardiac status is stable.  Remains off of diuretics at this time.  Acute metabolic encephalopathy/hospital delirium Had episodes of agitation for several days.  Required mittens.  Was given Haldol.  Subsequently started on Seroquel which seems to be helping.  Mittens were removed.  Seems to be back to baseline.  Continue current dose of Seroquel.  Persistent atrial fibrillation Cardiology was managing.  Noted to be on amiodarone and metoprolol.  Also on apixaban for anticoagulation. Heart rate is reasonably well controlled.  Essential hypertension: Blood pressure is reasonably well controlled.   Hyperlipidemia: Should be able to resume statin at discharge  Hyperglycemia No diabetic medications noted on home medication list.  Elevated CBGs were likely due to tube feedings.  Now resolved.   Normocytic anemia Likely anemia of chronic disease.   Mild drop in hemoglobin is dilutional.  Noted to be stable this morning.  No overt blood loss.   Goals of care/ethics: Palliative care following.  Patient is noted to be DNR.  Prognosis is guarded.    Mild irritant contact dermatitis MASD.  Noted in  the sacral area.  Seen by wound care.   DVT Prophylaxis: On apixaban Code Status: DNR Family Communication: No family at bedside.  Daughter being updated periodically. Disposition Plan: Skilled nursing facility is recommended.  Medically stable.  Waiting on SNF.  Status is: Inpatient Remains inpatient appropriate because: Pneumoperitoneum, gastric cancer, poor oral intake      Medications: Scheduled:  amiodarone  200 mg Oral Daily   apixaban  5 mg Oral BID   Chlorhexidine Gluconate Cloth  6 each Topical Daily   diphenhydrAMINE  25 mg Oral QHS   feeding supplement  237 mL Oral TID WC   fentaNYL  1 patch Transdermal Q72H   Gerhardt's butt cream   Topical TID   metoprolol succinate  12.5 mg Oral Daily   mouth rinse  15 mL Mouth Rinse 4 times per day   pantoprazole  40 mg Oral QHS   QUEtiapine  50 mg Oral QHS   sodium chloride flush  10-40 mL Intracatheter Q12H   thiamine  100 mg Oral Daily   Continuous:  sodium chloride Stopped (01/04/22 1748)   NOB:SJGGEZ chloride, bisacodyl, haloperidol lactate, heparin lock flush, HYDROmorphone (DILAUDID) injection, HYDROmorphone HCl, lip balm, ondansetron **OR** ondansetron (ZOFRAN) IV, mouth rinse, phenol, sodium chloride flush  Antibiotics: Anti-infectives (From admission, onward)    Start     Dose/Rate Route Frequency Ordered Stop   01/04/22 1200  piperacillin-tazobactam (ZOSYN) IVPB 3.375 g  Status:  Discontinued        3.375 g 12.5 mL/hr over 240 Minutes Intravenous Every 8 hours 01/04/22 0729 01/08/22 1116   01/02/22 1400  piperacillin-tazobactam (ZOSYN) IVPB 2.25 g  Status:  Discontinued        2.25 g 100 mL/hr over 30 Minutes Intravenous Every 8 hours 01/02/22 0718 01/04/22 0728   01/01/22 2000  vancomycin (VANCOCIN) IVPB 1000 mg/200 mL premix  Status:  Discontinued        1,000 mg 200 mL/hr over 60 Minutes Intravenous Every 24 hours 12/31/21 1820 01/01/22 0824   12/31/21 2000  piperacillin-tazobactam (ZOSYN) IVPB 3.375 g   Status:  Discontinued        3.375 g 12.5 mL/hr over 240 Minutes Intravenous Every 8 hours 12/31/21 1821 01/02/22 0718   12/31/21 1130  vancomycin (VANCOREADY) IVPB 1750 mg/350 mL        1,750 mg 175 mL/hr over 120 Minutes Intravenous  Once 12/31/21 1124 12/31/21 1411   12/31/21 1130  ceFEPIme (MAXIPIME) 2 g in sodium chloride 0.9 % 100 mL IVPB        2 g 200 mL/hr over 30 Minutes Intravenous  Once 12/31/21 1124 12/31/21 1226       Objective:  Vital Signs  Vitals:   01/20/22 1234 01/20/22 1959 01/21/22 0350 01/21/22 0955  BP: 115/79 121/65 (!) 116/59 120/60  Pulse: 81 77 81 80  Resp: '16 14 14   '$ Temp: 98.2 F (36.8 C) 99.6 F (37.6 C) 98.8 F (37.1 C)   TempSrc: Oral Oral Oral   SpO2: 100% 100% 100%   Weight:   79 kg   Height:        Intake/Output Summary (Last 24 hours) at 01/21/2022 1050 Last data filed at 01/21/2022 0828 Gross per 24 hour  Intake 380 ml  Output 1100 ml  Net -720  ml    Filed Weights   01/17/22 0413 01/18/22 1431 01/21/22 0350  Weight: 78.9 kg 81.8 kg 79 kg    General appearance: Awake alert.  In no distress Resp: Clear to auscultation bilaterally.  Normal effort Cardio: S1-S2 is normal regular.  No S3-S4.  No rubs murmurs or bruit GI: Abdomen is soft.  Dressing noted Extremities: No edema.  Full range of motion of lower extremities.    Lab Results:  Data Reviewed: I have personally reviewed following labs and reports of the imaging studies  CBC: Recent Labs  Lab 01/17/22 0401 01/18/22 0222 01/20/22 0312 01/21/22 0331  WBC 10.6* 13.7* 12.4* 13.4*  HGB 8.4* 8.2* 7.6* 7.6*  HCT 26.2* 25.5* 22.6* 23.5*  MCV 95.3 94.4 92.6 92.9  PLT 387 389 335 345     Basic Metabolic Panel: Recent Labs  Lab 01/15/22 0420 01/17/22 0401 01/18/22 0222 01/20/22 0312  NA 144 145 141 137  K 3.8 3.8 3.9 3.8  CL 109 112* 109 108  CO2 '29 26 26 23  '$ GLUCOSE 123* 91 83 79  BUN 44* 32* 28* 23  CREATININE 1.60* 1.29* 1.31* 1.46*  CALCIUM 8.4* 8.4*  8.4* 8.2*     GFR: Estimated Creatinine Clearance: 35.8 mL/min (A) (by C-G formula based on SCr of 1.46 mg/dL (H)).  Liver Function Tests: Recent Labs  Lab 01/17/22 0401  AST 20  ALT 28  ALKPHOS 65  BILITOT 0.4  PROT 6.9  ALBUMIN 1.8*       CBG: Recent Labs  Lab 01/18/22 0801 01/18/22 1139 01/18/22 1627 01/18/22 2043 01/19/22 2009  GLUCAP 78 100* 93 83 89       Radiology Studies: No results found.     LOS: 21 days   [employer]an [employer] on [email or web address]  01/21/2022, 10:50 AM

## 2022-01-22 ENCOUNTER — Other Ambulatory Visit: Payer: Self-pay

## 2022-01-22 MED ORDER — HEPARIN SOD (PORK) LOCK FLUSH 100 UNIT/ML IV SOLN
500.0000 [IU] | INTRAVENOUS | Status: AC | PRN
  Administered 2022-01-22: 500 [IU]

## 2022-01-22 NOTE — Progress Notes (Signed)
Nutrition Follow-up  DOCUMENTATION CODES:   Not applicable  INTERVENTION:  - continue Ensure TID.    NUTRITION DIAGNOSIS:   Increased nutrient needs related to cancer and cancer related treatments, post-op healing as evidenced by estimated needs. -ongoing  GOAL:   Patient will meet greater than or equal to 90% of their needs -unmet  MONITOR:   PO intake, Supplement acceptance, Labs, Weight trends, Skin  ASSESSMENT:   88 year-old male with medical history of stage IIB gastric adenocarcinoma currently receiving chemo, Afib on eliquis, stage 3 CKD, HTN, prostate cancer, stroke, and HLD. He presented to the ED on 7/3 due to abdominal pain. In the ED, CT abdomen/pelvis showed intra-abdominal free air which was concerning for bowel perforation. On 7/3 he emergently underwent ex lap d/t perforation.  Small bore NGT placed on 7/16 and removed at shift change on 7/19 AM. Tube feeding regimen discontinued.   SLP saw patient on 7/18 and recommendation was made for Dysphagia 1, thin liquids. SLP re-assessed patient on 7/20 and recommendation made for Dysphagia 2, thin liquids.   Per chart review, patient has been eating 10-50% at meals since lunch on 7/22. Ensure is ordered TID since 7/13 and he has been accepting this supplement 90% of the time offered.   Weight has been fairly stable since 7/15. Non-pitting edema to all extremities documented in the edema section of flow sheet.  Discharge order and summary entered for d/c to [facility]; [facility] has been called.    Labs reviewed; creatinine: 1.46 mg/dl, Ca: 8.2 mg/dl, GFR: 45 ml/min. Medications reviewed; 40 mg oral protonix/day, 100 mg oral thiamine/day.   Diet Order:   Diet Order             DIET DYS 2 Room service appropriate? Yes; Fluid consistency: Thin  Diet effective now                   EDUCATION NEEDS:   No education needs have been identified at this time  Skin:  Skin Assessment: Skin Integrity Issues: Skin  Integrity Issues:: Incisions, Other (Comment) Incisions: abdomen (7/3) Other: IAD bilateral buttocks (7/14); MARSI to penis (newly documented on 7/25)  Last BM:  7/18 (type 7, large amount)  Height:   Ht Readings from Last 1 Encounters:  01/18/22 '5\' 11"'$  (1.803 m)    Weight:   Wt Readings from Last 1 Encounters:  01/22/22 80.4 kg     BMI:  Body mass index is 24.72 kg/m.  Estimated Nutritional Needs:  Kcal:  2300-2500 kcal Protein:  110-125 grams Fluid:  >/= 2.3 L/day     Jarome Matin, MS, RD, LDN, CNSC Registered Dietitian II Inpatient Clinical Nutrition RD pager # and on-call/weekend pager # available in [facility]

## 2022-01-22 NOTE — TOC Progression Note (Signed)
Transition of Care [facility]) - Progression Note    Patient Details  Name: Dillon Smith MRN: [id number] Date of Birth: Oct 29, 1928  Transition of Care [facility]) CM/SW Contact  Farrell Pantaleo, Juliann Pulse, RN Phone Number: 01/22/2022, 10:04 AM  Clinical Narrative: Received message from admit in ref to DOB-per drivers [id number].They have placed the alternate DOB in system [birth year](goes through to medicare). Left vm w/[facility] rep Christine & intake coordinator christine if able to accept today for SNF. d/c summary sent.      Expected Discharge Plan: Skilled Nursing Facility Barriers to Discharge: Other (must enter comment) (awaiting facility to accept d/t DOB discrepancy 04/02/2031 vs [birth year]/01[birth year])  Expected Discharge Plan and Services Expected Discharge Plan: [place]   Discharge Planning Services: CM Consult   Living arrangements for the past 2 months: Single Family Home Expected Discharge Date: 01/21/22                                     Social Determinants of Health (SDOH) Interventions    Readmission Risk Interventions     No data to display

## 2022-01-22 NOTE — Discharge Summary (Signed)
[facility]  Physician Discharge Summary   Patient ID: Dillon Smith MRN: [id number] DOB/AGE: Mar 24, 1929 88 y.o.  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 Needs follow-up with medical oncology, [name]   Home Health: SNF Equipment/Devices: None  CODE STATUS: DNR  DISCHARGE CONDITION: fair  Diet recommendation: Dysphagia 2 diet with thin liquids  INITIAL HISTORY: 88 y.o. male past medical history of chronic kidney disease stage III, paroxysmal atrial fibrillation on anticoagulation at home, retention history of gastric cancer was admitted by the surgical service as work-up revealed pneumoperitoneum due to gastric perforation. Was taken to the OR and is status post exploratory laparotomy and drainage of intra-abdominal abscess on 7/3.  Initially tried hospitalist was consulted for medical management.  Patient has stabilized.  General surgery requesting hospitalist to take over care.     Consultants: General surgery.  Cardiology.  Palliative care.  Previously seen by critical care medicine.   Procedures: Exploratory laparotomy with drainage of intra-abdominal abscess 7/3   HOSPITAL COURSE:   Pneumoperitoneum in the setting of gastric cancer perforation on chemotherapy: Status post surgical intervention with exploratory laparotomy and intra-abdominal abscesses on 7/3. General surgery has been following.  Patient pulled his core track feeding tube out on 7/19.  No plans for  reinsertion of feeding tube at this time.  Not a candidate for PEG tube due to his history of gastric cancer.  Palliative care is following. Seems to be tolerating a dysphagia 2 diet at this time. Pain seems to be adequately controlled.  He is on fentanyl patch.  Medical oncology was following.  Looks like patient was receiving neoadjuvant chemotherapy for his gastric cancer which he tolerated well.  Chemotherapy currently on hold.  Medical oncology to reevaluate treatment options in the outpatient setting.  Followed by [name]. Has had multiple loose stools likely due to combination of tube feedings as well as resumption of bowel function.  Seems to have improved.   General surgery has reevaluated him.  Seems to be stable from a surgical standpoint.  Wound care instructions as per general surgery.   Acute kidney injury on chronic kidney disease stage IIIa: Looks like baseline creatinine is between 1.3-1.7.  Presented with creatinine greater than 2.  Renal function has stabilized.      Acute on chronic systolic and diastolic CHF EF known to be 25% based on echo done during this hospitalization.  Down from 45% in 2021. Cardiology has been following.  Patient noted to be on metoprolol.  Diuretics on hold for now.   Due to borderline blood pressure patient is not on ARB or ACE inhibitor.  Elevated creatinine also precludes this for now. Cardiac status is stable.  Remains off of diuretics at this time.   Acute metabolic encephalopathy/hospital delirium Had episodes of agitation for several days.  Required mittens.  Was given Haldol.  Subsequently started on Seroquel which seems to be helping.  Mittens were removed.  Seems to be back to baseline.  Continue current dose of  Seroquel.   Persistent atrial fibrillation Cardiology was managing.  Noted to be on amiodarone and metoprolol.  Also on apixaban for anticoagulation. Heart rate is reasonably well controlled.   Essential hypertension: Blood  pressure is reasonably well controlled.   Hyperlipidemia: Should be able to resume statin at discharge  Hyperglycemia No diabetic medications noted on home medication list.  Elevated CBGs were likely due to tube feedings.  Now resolved.   Normocytic anemia Likely anemia of chronic disease.   Mild drop in hemoglobin is dilutional.  Noted to be stable this morning.  No overt blood loss.    Goals of care/ethics: Palliative care following.  Patient is noted to be DNR.  Prognosis is guarded.     Mild irritant contact dermatitis MASD.  Noted in the sacral area.  Seen by wound care.     Patient is stable.  No overnight events reported.  Remains stable for discharge to skilled nursing facility.   PERTINENT LABS:  The results of significant diagnostics from this hospitalization (including imaging, microbiology, ancillary and laboratory) are listed below for reference.     Labs:   Basic Metabolic Panel: Recent Labs  Lab 01/17/22 0401 01/18/22 0222 01/20/22 0312  NA 145 141 137  K 3.8 3.9 3.8  CL 112* 109 108  CO2 '26 26 23  '$ GLUCOSE 91 83 79  BUN 32* 28* 23  CREATININE 1.29* 1.31* 1.46*  CALCIUM 8.4* 8.4* 8.2*   Liver Function Tests: Recent Labs  Lab 01/17/22 0401  AST 20  ALT 28  ALKPHOS 65  BILITOT 0.4  PROT 6.9  ALBUMIN 1.8*    CBC: Recent Labs  Lab 01/17/22 0401 01/18/22 0222 01/20/22 0312 01/21/22 0331  WBC 10.6* 13.7* 12.4* 13.4*  HGB 8.4* 8.2* 7.6* 7.6*  HCT 26.2* 25.5* 22.6* 23.5*  MCV 95.3 94.4 92.6 92.9  PLT 387 389 335 345    BNP: BNP (last 3 results) Recent Labs    01/09/22 0506  BNP 482.5*     CBG: Recent Labs  Lab 01/18/22 0801 01/18/22 1139 01/18/22 1627 01/18/22 2043 01/19/22 2009  GLUCAP 78 100* 93 83 89     IMAGING STUDIES DG Abd 1 View  Result Date: 01/13/2022 CLINICAL DATA:  Feeding tube placement. EXAM: ABDOMEN - 1 VIEW COMPARISON:  01/11/2022 FINDINGS: Feeding tube remains in place with the tip in the body of the  stomach. No evidence of dilated bowel loops. IMPRESSION: Feeding tube tip remains in the body of the stomach. Electronically Signed   By: Marlaine Hind M.D.   On: 01/13/2022 10:57   DG Abd Portable 1V  Result Date: 01/11/2022 CLINICAL DATA:  Feeding tube placement EXAM: PORTABLE ABDOMEN - 1 VIEW COMPARISON:  01/07/2022 FINDINGS: Tip of enteric tube is seen in the antrum of the stomach. There are ring-like densities in the left epigastrium, possibly a stent. Bowel gas pattern in the upper abdomen is unremarkable. Tip of central venous catheter is seen in superior vena cava close to the right atrium. IMPRESSION: Tip of feeding tube is seen in the antrum of the stomach. Electronically Signed   By: Elmer Picker M.D.   On: 01/11/2022 16:18   DG UGI W SINGLE CM (SOL OR THIN BA)  Result Date: 01/07/2022 CLINICAL DATA:  Pneumoperitoneum in the setting of known gastric cancer on chemotherapy. Status post ex lap drainage of intra-abdominal abscess. EXAM: UPPER GI SERIES WITH KUB TECHNIQUE: A water-soluble contrast upper GI series was attempted. The examination was attempted by Rowe Robert,  PA-C, and was supervised and interpreted by Dr. Kellie Simmering. FLUOROSCOPY: Fluoroscopy time: 42 seconds (12.9 mGy). COMPARISON:  CTA abdomen/pelvis 12/31/2021. FINDINGS: Prior to the procedure, a scout radiograph of the abdomen was obtained. Bilateral surgical drains. Gastroduodenal bypass device. No dilated loops of small bowel. Gaseous distention of the colon. Degenerative changes of the spine and bilateral femoroacetabular joints. Subsequently, a water-soluble contrast upper GI series was attempted. However, the patient was unable to swallow the contrast and the examination was terminated. IMPRESSION: Attempted water-soluble contrast upper GI series, as described. The patient was unable to swallow the contrast, and the examination was terminated. Electronically Signed   By: Kellie Simmering D.O.   On: 01/07/2022 12:57   DG  Chest Port 1 View  Result Date: 01/04/2022 CLINICAL DATA:  Status post PICC placement EXAM: PORTABLE CHEST 1 VIEW COMPARISON:  12/31/2021 chest radiograph. FINDINGS: Left PICC terminates at the cavoatrial junction. Left internal jugular central venous catheter terminates in the upper third of the SVC. Right internal jugular Port-A-Cath terminates at the cavoatrial junction. Intact sternotomy wires. Enteric tube enters stomach with the tip not seen on this image. Stable cardiomediastinal silhouette with mild cardiomegaly. No pneumothorax. Left retrocardiac opacity and small left pleural effusion, new. No right pleural effusion. No pulmonary edema. IMPRESSION: 1. Left PICC terminates at the cavoatrial junction. 2. New small left pleural effusion. New left retrocardiac opacity, favor atelectasis. 3. Stable mild cardiomegaly.  No pulmonary edema. Electronically Signed   By: Ilona Sorrel M.D.   On: 01/04/2022 16:40   [name] EKG SITE RITE  Result Date: 01/04/2022 If Site Rite image not attached, placement could not be confirmed due to current cardiac rhythm.  ECHOCARDIOGRAM COMPLETE  Result Date: 01/03/2022    ECHOCARDIOGRAM REPORT   Patient Name:   Dillon Smith Date of Exam: 01/03/2022 Medical Rec #:  [id number]        Height:       71.0 in Accession #:    [id number]       Weight:       186.7 lb Date of Birth:  [birth year]        BSA:          2.048 m Patient Age:    67 years         BP:           133/74 mmHg Patient Gender: M                HR:           97 bpm. Exam Location:  Inpatient Procedure: 2D Echo, Cardiac Doppler, Color Doppler and Strain Analysis Indications:    dyspnea  History:        Patient has prior history of Echocardiogram examinations, most                 recent 08/16/2019. CAD, Arrythmias:Atrial Fibrillation; Risk                 Factors:Hypertension.  Sonographer:    Joette Catching RCS Referring Phys: [id number] Hortencia Conradi MEIER  Sonographer Comments: Global longitudinal strain was attempted.  IMPRESSIONS  1. Left ventricular ejection fraction, by estimation, is 25 to 30%. The left ventricle has severely decreased function. The left ventricle demonstrates global hypokinesis. Left ventricular diastolic parameters are consistent with Grade II diastolic dysfunction (pseudonormalization). The average left ventricular global longitudinal strain is -8.0 %. The global longitudinal strain is abnormal.  2. Right ventricular systolic function is mildly reduced. The  right ventricular size is moderately enlarged. There is mildly elevated pulmonary artery systolic pressure. The estimated right ventricular systolic pressure is 81.4 mmHg.  3. Left atrial size was severely dilated.  4. Right atrial size was moderately dilated.  5. The mitral valve is normal in structure. Moderate mitral valve regurgitation. No evidence of mitral stenosis.  6. Tricuspid valve regurgitation is moderate.  7. The aortic valve is tricuspid. There is mild calcification of the aortic valve. There is mild thickening of the aortic valve. Aortic valve regurgitation is severe. Aortic valve sclerosis is present, with no evidence of aortic valve stenosis.  8. The inferior vena cava is normal in size with greater than 50% respiratory variability, suggesting right atrial pressure of 3 mmHg. Comparison(s): Prior images reviewed side by side. FINDINGS  Left Ventricle: Left ventricular ejection fraction, by estimation, is 25 to 30%. The left ventricle has severely decreased function. The left ventricle demonstrates global hypokinesis. The average left ventricular global longitudinal strain is -8.0 %. The global longitudinal strain is abnormal. The left ventricular internal cavity size was normal in size. There is no left ventricular hypertrophy. Left ventricular diastolic parameters are consistent with Grade II diastolic dysfunction (pseudonormalization).  LV Wall Scoring: The mid anteroseptal segment and mid inferoseptal segment are akinetic. Right  Ventricle: The right ventricular size is moderately enlarged. No increase in right ventricular wall thickness. Right ventricular systolic function is mildly reduced. There is mildly elevated pulmonary artery systolic pressure. The tricuspid regurgitant velocity is 3.06 m/s, and with an assumed right atrial pressure of 3 mmHg, the estimated right ventricular systolic pressure is 48.1 mmHg. Left Atrium: Left atrial size was severely dilated. Right Atrium: Right atrial size was moderately dilated. Pericardium: There is no evidence of pericardial effusion. Mitral Valve: The mitral valve is normal in structure. Moderate mitral valve regurgitation. No evidence of mitral valve stenosis. Tricuspid Valve: The tricuspid valve is normal in structure. Tricuspid valve regurgitation is moderate . No evidence of tricuspid stenosis. Aortic Valve: The aortic valve is tricuspid. There is mild calcification of the aortic valve. There is mild thickening of the aortic valve. Aortic valve regurgitation is severe. Aortic regurgitation PHT measures 648 msec. Aortic valve sclerosis is present, with no evidence of aortic valve stenosis. Aortic valve mean gradient measures 3.5 mmHg. Aortic valve peak gradient measures 6.3 mmHg. Aortic valve area, by VTI measures 2.74 cm. Pulmonic Valve: The pulmonic valve was normal in structure. Pulmonic valve regurgitation is trivial. No evidence of pulmonic stenosis. Aorta: The aortic root is normal in size and structure. Venous: The inferior vena cava is normal in size with greater than 50% respiratory variability, suggesting right atrial pressure of 3 mmHg. IAS/Shunts: No atrial level shunt detected by color flow Doppler.  LEFT VENTRICLE PLAX 2D LVIDd:         5.90 cm      Diastology LVIDs:         4.90 cm      LV e' medial:    7.72 cm/s LV PW:         1.00 cm      LV E/e' medial:  11.5 LV IVS:        1.00 cm      LV e' lateral:   12.90 cm/s LVOT diam:     2.20 cm      LV E/e' lateral: 6.9 LV SV:          50 LV SV Index:   25  2D Longitudinal Strain LVOT Area:     3.80 cm     2D Strain GLS Avg:     -8.0 %  LV Volumes (MOD) LV vol d, MOD A2C: 138.0 ml LV vol d, MOD A4C: 181.0 ml LV vol s, MOD A2C: 127.0 ml LV vol s, MOD A4C: 95.1 ml LV SV MOD A2C:     11.0 ml LV SV MOD A4C:     181.0 ml LV SV MOD BP:      46.3 ml RIGHT VENTRICLE RV Basal diam:  5.45 cm RV Mid diam:    3.70 cm RV S prime:     14.80 cm/s TAPSE (M-mode): 1.4 cm LEFT ATRIUM              Index        RIGHT ATRIUM           Index LA diam:        5.10 cm  2.49 cm/m   RA Area:     23.70 cm LA Vol (A2C):   156.0 ml 76.17 ml/m  RA Volume:   84.20 ml  41.11 ml/m LA Vol (A4C):   114.0 ml 55.67 ml/m LA Biplane Vol: 141.0 ml 68.85 ml/m  AORTIC VALVE                    PULMONIC VALVE AV Area (Vmax):    2.69 cm     PV Vmax:          0.80 m/s AV Area (Vmean):   2.79 cm     PV Peak grad:     2.5 mmHg AV Area (VTI):     2.74 cm     PR End Diast Vel: 10.98 msec AV Vmax:           125.10 cm/s AV Vmean:          79.200 cm/s AV VTI:            0.183 m AV Peak Grad:      6.3 mmHg AV Mean Grad:      3.5 mmHg LVOT Vmax:         88.55 cm/s LVOT Vmean:        58.050 cm/s LVOT VTI:          0.132 m LVOT/AV VTI ratio: 0.72 AI PHT:            648 msec  AORTA Ao Root diam: 3.90 cm Ao Asc diam:  3.90 cm MITRAL VALVE                  TRICUSPID VALVE MV Area (PHT): 4.06 cm       TR Peak grad:   37.5 mmHg MV Decel Time: 187 msec       TR Vmax:        306.00 cm/s MR Peak grad:    112.4 mmHg MR Mean grad:    68.0 mmHg    SHUNTS MR Vmax:         530.00 cm/s  Systemic VTI:  0.13 m MR Vmean:        388.0 cm/s   Systemic Diam: 2.20 cm MR PISA:         0.57 cm MR PISA Eff ROA: 4 mm MR PISA Radius:  0.30 cm MV E velocity: 88.70 cm/s MV A velocity: 29.10 cm/s MV E/A ratio:  3.05 Candee Furbish MD Electronically signed by Candee Furbish MD Signature Date/Time: 01/03/2022/1:04:41 PM  Final    DG Abd 1 View  Result Date: 01/01/2022 CLINICAL DATA:  Feeding tube placement. EXAM:  ABDOMEN - 1 VIEW COMPARISON:  Radiograph yesterday. FINDINGS: The enteric tube has been advanced, tip and side-port are below the diaphragm. Abdominal drains remain in place. IMPRESSION: Enteric tube with tip and side-port below the diaphragm in the stomach. Electronically Signed   By: Keith Rake M.D.   On: 01/01/2022 00:52   DG Abd 1 View  Result Date: 01/01/2022 CLINICAL DATA:  Feeding tube placement EXAM: ABDOMEN - 1 VIEW COMPARISON:  CT 12/31/2021 FINDINGS: Esophageal tube tip overlies the stomach, side-port near the GE junction. Probable drains over the upper abdomen. Faintly visible gastrojejunal stent. Upper gas pattern is unobstructed. IMPRESSION: Esophageal tube tip overlies stomach, side-port in the region of GE junction, further advancement could be considered for more optimal positioning Electronically Signed   By: Donavan Foil M.D.   On: 01/01/2022 00:11   CT Angio Abd/Pel W and/or Wo Contrast  Result Date: 12/31/2021 CLINICAL DATA:  Mesenteric ischemia, abdominal pain EXAM: CTA ABDOMEN AND PELVIS WITHOUT AND WITH CONTRAST TECHNIQUE: Multidetector CT imaging of the abdomen and pelvis was performed using the standard protocol during bolus administration of intravenous contrast. Multiplanar reconstructed images and MIPs were obtained and reviewed to evaluate the vascular anatomy. RADIATION DOSE REDUCTION: This exam was performed according to the departmental dose-optimization program which includes automated exposure control, adjustment of the mA and/or kV according to patient size and/or use of iterative reconstruction technique. CONTRAST:  53m OMNIPAQUE IOHEXOL 350 MG/ML SOLN COMPARISON:  CT 12/22/2021 FINDINGS: VASCULAR Aorta: Moderate calcified atheromatous plaque throughout. No dissection or stenosis. Fusiform 3.7 cm infrarenal aneurysm tapering to normal caliber above the bifurcation nonocclusive mural thrombus in the aneurysmal segment. Celiac: Calcified ostial plaque resulting in  short segment stenosis of at least mild hemodynamic significance. Branch vessels unremarkable. SMA: Calcified ostial plaque resulting in short segment stenosis of at least mild severity, mildly atheromatous but patent distally. Renals: Single left, with partially calcified ostial plaque resulting in short segment stenosis of at least mild severity, patent distally. Single right, mildly atheromatous but patent. IMA: Patent, arising from the distal aspect of the aneurysmal segment. Inflow: Fusiform dilatation of distal right common iliac artery up to 2 cm diameter, left 1.4 cm. Moderate scattered calcified atheromatous plaque throughout bilateral iliac arterial systems. Proximal Outflow: Atheromatous, patent Veins: Patent hepatic veins, portal vein, SMV, splenic vein, bilateral renal veins. No mesenteric or portal venous gas. Review of the MIP images confirms the above findings. NON-VASCULAR Lower chest: No pleural or pericardial effusion. Minimal interstitial infiltrate or dependent atelectasis posteriorly at the right lung base. Sternotomy wires. Central venous catheter extends to the proximal right atrium. Hepatobiliary: 1.4 cm hyperdense lesion in hepatic segment 5. Gallbladder nondistended. No calcified gallstones. No biliary ductal dilatation. Pancreas: Unremarkable. No pancreatic ductal dilatation or surrounding inflammatory changes. Spleen: Normal in size without focal abnormality. Adrenals/Urinary Tract: 3.2 cm enhancing left adrenal mass, stable. Bilateral renal cysts stable. No hydronephrosis. Urinary bladder physiologically distended. Stomach/Bowel: Stomach is decompressed. Patent gastroduodenal bypass device. Poorly marginated 3.6 cm enhancing antral mass. The duodenum is decompressed. Small bowel is nondilated. Normal appendix. The colon is incompletely distended, unremarkable. No pneumatosis. No mesenteric venous gas. Lymphatic: No abdominal or pelvic adenopathy. Reproductive: Metallic seeds throughout  the prostate. Other: Small volume abdominal and pelvic ascites. Scattered free air in the anterior upper abdomen. Musculoskeletal: Spondylitic changes in the lower lumbar spine. IMPRESSION: 1. Positive for free intraperitoneal  air, consistent with perforated viscus, site indeterminate. Critical Value/emergent results were called by telephone at the time of interpretation on 12/31/2021 at 1:57 pm to provider [facility] , who verbally acknowledged these results. 2. 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 3. 3.7 cm infrarenal abdominal aortic aneurysm. Consensus guidelines recommend follow-up ultrasound every 2 years. Reference: Joellyn Rued Radiol 2013; 10:789-794. Electronically Signed   By: Lucrezia Europe M.D.   On: 12/31/2021 14:03   DG Chest Portable 1 View  Result Date: 12/31/2021 CLINICAL DATA:  Provided history: Epigastric pain, rule out infection. EXAM: PORTABLE CHEST 1 VIEW COMPARISON:  Chest radiograph 09/12/2021 (images available, report unavailable). FINDINGS: Right chest infusion port catheter with tip projecting at the level of the superior cavoatrial junction. Prior median sternotomy/CABG. Heart size at the upper limits of normal. Heart size at the upper limits of normal. No appreciable airspace consolidation or pulmonary edema. No evidence of pleural effusion or pneumothorax. No acute bony abnormality identified. IMPRESSION: Right chest infusion port catheter with tip at the level of the superior cavoatrial junction. No evidence of acute cardiopulmonary abnormality. Aortic Atherosclerosis (ICD10-I70.0). Electronically Signed   By: Kellie Simmering D.O.   On: 12/31/2021 11:58    DISCHARGE EXAMINATION: Vitals:   01/21/22 1935 01/21/22 2000 01/21/22 2100 01/22/22 0404  BP: 107/70     Pulse: 75     Resp: '16 17 17   '$ Temp: 98.6 F (37 C)     TempSrc: Oral     SpO2: 100%     Weight:    80.4 kg  Height:       General appearance: Awake alert.  In no  distress Resp: Clear to auscultation bilaterally.  Normal effort Cardio: S1-S2 is normal regular.  No S3-S4.  No rubs murmurs or bruit GI: Abdomen is soft.  Dressing noted over the abdomen.  Abdomen is nontender.   DISPOSITION: SNF  Discharge Instructions     Call MD for:  difficulty breathing, headache or visual disturbances   Complete by: As directed    Call MD for:  extreme fatigue   Complete by: As directed    Call MD for:  hives   Complete by: As directed    Call MD for:  persistant dizziness or light-headedness   Complete by: As directed    Call MD for:  persistant nausea and vomiting   Complete by: As directed    Call MD for:  redness, tenderness, or signs of infection (pain, swelling, redness, odor or green/yellow discharge around incision site)   Complete by: As directed    Call MD for:  severe uncontrolled pain   Complete by: As directed    Call MD for:  temperature >100.4   Complete by: As directed    Discharge instructions   Complete by: As directed    Please review instructions on the discharge summary.  You were cared for by a hospitalist during your hospital stay. If you have any questions about your discharge medications or the care you received while you were in the hospital after you are discharged, you can call the unit and asked to speak with the hospitalist on call if the hospitalist that took care of you is not available. Once you are discharged, your primary care physician will handle any further medical issues. Please note that NO REFILLS for any discharge medications will be authorized once you are discharged, as it is imperative that you return to your primary  care physician (or establish a relationship with a primary care physician if you do not have one) for your aftercare needs so that they can reassess your need for medications and monitor your lab values. If you do not have a primary care physician, you can call [phone] for a physician referral.    Discharge wound care:   Complete by: As directed    Please review instructions provided by general surgery.   Discharge wound care:   Complete by: As directed    Once daily saline wet to dry dressing to midline wound.   Increase activity slowly   Complete by: As directed           Allergies as of 01/22/2022       Reactions   Penicillins Itching        Medication List     STOP taking these medications    amLODipine 10 MG tablet Commonly known as: NORVASC   capecitabine 500 MG tablet Commonly known as: XELODA   glycerin adult 2 g suppository   lidocaine-prilocaine cream Commonly known as: EMLA   metoprolol tartrate 50 MG tablet Commonly known as: LOPRESSOR   ondansetron 8 MG tablet Commonly known as: Zofran   prochlorperazine 10 MG tablet Commonly known as: COMPAZINE       TAKE these medications    amiodarone 200 MG tablet Commonly known as: PACERONE Take 1 tablet (200 mg total) by mouth daily.   apixaban 5 MG Tabs tablet Commonly known as: ELIQUIS Take 1 tablet (5 mg total) by mouth 2 (two) times daily. What changed: how much to take   atorvastatin 40 MG tablet Commonly known as: LIPITOR Take 1 tablet (40 mg total) by mouth at bedtime.   diphenhydrAMINE 25 mg capsule Commonly known as: BENADRYL Take 1 capsule (25 mg total) by mouth at bedtime.   feeding supplement Liqd Take 237 mLs by mouth 3 (three) times daily with meals.   fentaNYL 25 MCG/HR Commonly known as: [place] 1 patch onto the skin every 3 (three) days.   Gerhardt's butt cream Crea Apply 1 Application topically 3 (three) times daily.   HYDROmorphone 2 MG tablet Commonly known as: DILAUDID Take 1 tablet (2 mg total) by mouth every 4 (four) hours as needed for severe pain.   metoprolol succinate 25 MG 24 hr tablet Commonly known as: TOPROL-XL Take 0.5 tablets (12.5 mg total) by mouth daily.   MULTI VITAMIN DAILY PO Take 0.5 tablets by mouth daily.   ondansetron  4 MG disintegrating tablet Commonly known as: ZOFRAN-ODT Take 1 tablet (4 mg total) by mouth every 6 (six) hours as needed for nausea.   pantoprazole 40 MG tablet Commonly known as: PROTONIX Take 1 tablet (40 mg total) by mouth at bedtime.   phenol 1.4 % Liqd Commonly known as: CHLORASEPTIC Use as directed 2 sprays in the mouth or throat every 2 (two) hours as needed for throat irritation / pain.   QUEtiapine 50 MG tablet Commonly known as: SEROQUEL Take 1 tablet (50 mg total) by mouth at bedtime.   thiamine 100 MG tablet Take 1 tablet (100 mg total) by mouth daily.               Discharge Care Instructions  (From admission, onward)           Start     Ordered   01/21/22 0000  Discharge wound care:       Comments: Please review instructions provided by general surgery.  01/21/22 1049   01/21/22 0000  Discharge wound care:       Comments: Once daily saline wet to dry dressing to midline wound.   01/21/22 1114              Contact information for follow-up providers     Dwan Bolt, MD. Schedule an appointment as soon as possible for a visit.   Specialty: General Surgery Why: to follow up regarding gastric cancer once home from rehab. Contact information: [place]. [address] [place] [address] [phone]              Contact information for after-discharge care     Destination     [facility] .   Service: Skilled Nursing Contact information: [address] [place] [phone]                     TOTAL DISCHARGE TIME: 14 minutes  [facility] Pager on [email or web address]  01/22/2022, 8:59 AM

## 2022-01-22 NOTE — Progress Notes (Signed)
[facility] picked up patient for transport to [facility].  [facility] called, operator told which patient was en route, RN transferred to answering machine.  RN left name and phone number and requested return phone call for report.  [facility] deaccessed.  Patient's belongings (including hearing aids and dentures) with patient.  Daughter, [name], called and informed that patient was en route to [facility].  Angie Fava, RN

## 2022-01-22 NOTE — TOC Transition Note (Signed)
Transition of Care [facility]) - CM/SW Discharge Note   Patient Details  Name: Dillon Smith MRN: [id number] Date of Birth: 11/06/1928  [facility]) CM/SW Contact:  Dessa Phi, RN Phone Number: 01/22/2022, 11:29 AM   Clinical Narrative:d/c today to [id number],report tel#[phone].PTAR called. No further CM needs.       Final next level of care: Skilled Nursing Facility Barriers to Discharge: No Barriers Identified   Patient Goals and CMS Choice Patient states their goals for this hospitalization and ongoing recovery are:: to go home CMS Medicare.gov Compare Post Acute Care list provided to:: Patient Choice offered to / list presented to : Patient  Discharge Placement PASRR number recieved: 01/19/22            Patient chooses bed at: [facility] Patient to be transferred to facility by:  Corey Harold) Name of family member notified:  [name] dtr) Patient and family notified of of transfer: 01/22/22  Discharge Plan and Services   Discharge Planning Services: CM Consult                                 Social Determinants of Health ([place]) Interventions     Readmission Risk Interventions     No data to display

## 2022-01-24 ENCOUNTER — Ambulatory Visit: Payer: Medicare Other | Admitting: Hematology

## 2022-01-24 ENCOUNTER — Ambulatory Visit: Payer: Medicare Other

## 2022-01-24 ENCOUNTER — Other Ambulatory Visit: Payer: Medicare Other

## 2022-01-24 ENCOUNTER — Encounter: Payer: Medicare Other | Admitting: Dietician

## 2022-01-26 ENCOUNTER — Inpatient Hospital Stay: Payer: Medicare Other

## 2022-01-29 ENCOUNTER — Other Ambulatory Visit: Payer: Self-pay

## 2022-02-05 ENCOUNTER — Inpatient Hospital Stay: Payer: Medicare Other | Admitting: Hematology

## 2022-02-05 ENCOUNTER — Inpatient Hospital Stay: Payer: Medicare Other | Attending: Nurse Practitioner

## 2022-02-13 ENCOUNTER — Telehealth: Payer: Self-pay | Admitting: Hematology

## 2022-02-13 NOTE — Telephone Encounter (Signed)
Scheduled per 8/16 in basket, attempted to call pt and pt daughter but no answer and no voicemail available, will try to call again

## 2022-02-14 ENCOUNTER — Inpatient Hospital Stay (HOSPITAL_BASED_OUTPATIENT_CLINIC_OR_DEPARTMENT_OTHER): Payer: Medicare Other | Admitting: Hematology

## 2022-02-14 ENCOUNTER — Other Ambulatory Visit: Payer: Self-pay

## 2022-02-14 ENCOUNTER — Encounter: Payer: Self-pay | Admitting: Hematology

## 2022-02-14 DIAGNOSIS — C163 Malignant neoplasm of pyloric antrum: Secondary | ICD-10-CM

## 2022-02-14 NOTE — Progress Notes (Signed)
[place]   Telephone:(336) [phone] Fax:(336) [phone]   Clinic Follow up Note   Patient Care Team: Clinic, [name] as PCP - General [name], MD as PCP - Cardiology (Cardiology) Thompson Grayer, MD as PCP - Electrophysiology (Cardiology) [name], MD as Consulting Physician (Radiation Oncology) [name], RN as Registered Nurse [name], MD as Consulting Physician (Hematology) [name], NP as Nurse Practitioner (Nurse Practitioner)  Date of Service:  02/14/2022  I connected with Dillon Smith on 02/14/2022 at  2:20 PM EDT by telephone visit and verified that I am speaking with the correct person using two identifiers.  I discussed the limitations, risks, security and privacy concerns of performing an evaluation and management service by telephone and the availability of in person appointments. I also discussed with the patient that there may be a patient responsible charge related to this service. The patient expressed understanding and agreed to proceed.   Other persons participating in the visit and their role in the encounter:  pt's daughter  Patient's location:  his home Provider's location:  my office  CHIEF COMPLAINT: f/u of gastric cancer  CURRENT THERAPY:  Surveillance, supportive care  ASSESSMENT & PLAN:  Dillon Smith is a 88 y.o. male with   1. Goal of care discussion  -We discussed his recent functional decline, especially given his recent hospitalization.He unfortunately is not a candidate for surgery or chemotherapy anymore  -we discussed supportive care options, including palliative care and hospice at home. He currently has a CNA that comes to the house to help bathe him. His daughter expressed concern that he needs someone there full time and that neither she nor her sister can do that for him at this time. I recommend reaching out to the [facility], as he is a veteran, and I will referral to [employer].  I  discussed the difference between palliative care and hospice, and encourage patient and his daughter to consider hospice so he can get more care at home.  Discussed the option of residential hospice down the road.  After lengthy discussion, they agree with hospice referral, and I will refer him today. -per pt's daughter, he has a living will in place. They previously agreed with DNR during his hospitalization, and they verbally agree again today (02/14/22)  2. Adenocarcinoma of the pylorus/distal stomach, cT3N0M0, stage IIB, MMR normal, PD-L1 CPS 2% -presented with reported 30 pound weight loss over a year, and 2-week history of GERD, constipation, and regurgitation. CT AP w/o contrast at the [facility] on 09/12/21 showed pyloric thickening. Biopsy from pyloric channel obtained during EGD on 09/14/21 confirmed adenocarcinoma, MMR was normal. Baseline CEA and CA19.9 normal  -Due to gastric outlet obstruction he underwent EGD/EUS with pylori stent placement on 09/21/21 and his symptoms completely resolved  -He was referred from [place] to [name] for palliative radiation. Staging CT 09/21/21 and PET scan 10/18/21 were negative for lymphadenopathy or distant metastasis -staging EUS 11/22/21 showed T3 N0 disease  -he was started on neoadjuvant FOLFOX on 11/29/21. He tolerated well overall with taste changes. His CEA normalized on treatment. -he was hospitalized 12/31/21 with pneumoperitoneum, requiring emergent ex lap drainage of the intra-abdominal abscess. -given his hospitalization and significant functional decline, we do think he is a candidate for more chemotherapy, radiation or surgery.  -Transition him to hospice care.  3. Comorbidities: HTN, HL, A-fib, history of stroke, prostate cancer 2004 s/p brachytherapy, CKD stage III  -Currently on amlodipine, apixaban, and atorvastatin.  Denies bleeding on anticoagulation  -He has no residual deficits from previous stroke -His chronic conditions appear to be well controlled -Last  PSA was normal per patient, checked at the [place] -He has CKD and mild anemia, will monitor closely on chemo.    4. Social -Patient is single with 2 adult daughters who live out of state.  He is a high functioning gentleman who lives alone and is independent with ADLs including driving -His neighbor/friend checks on him and helps with medication -His niece brings him to his appointments and helps care for him.     PLAN: -referral to [facility] home hospice  -f/u open    No problem-specific Assessment & Plan notes found for this encounter.   SUMMARY OF ONCOLOGIC HISTORY: Oncology History  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[place])  09/14/2021 Initial Biopsy   A. pyloric channel biopsy -Adenocarcinoma involving superficial gastric mucosa Entheses the biopsy is superficial and the depth of invasion cannot be accurately determined) -Reactive gastropathy and benign duodenal mucosa  B. gastric biopsy -Chronic active gastritis -Immunostain for H. pylori is negative  MMR normal    09/14/2021 Initial Diagnosis   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[place])   09/14/2021 Cancer Staging   Staging form: Stomach, AJCC 8th Edition - Clinical stage from 09/14/2021: Stage IIB (cT3, cN0, cM0) - Signed by [name], MD on 11/23/2021 Stage prefix: Initial diagnosis Total positive nodes: 0   09/16/2021 Imaging   CT AP w contrast  1.  Findings in keeping with gastric outlet obstruction from circumferential gastric antral mass lesion. Recommend correlation with EGD and biopsy.  2.  Indeterminate complex enhancing left adrenal mass. The degree of heterogeneity and enhancement would be atypical for adenoma. Metastatic disease or pheochromocytoma are therefore considerations.  3.  Bilobed infrarenal abdominal aortic aneurysm measuring up to 3.4 cm with right common iliac artery aneurysm.  4.  Small left pleural effusion.   09/21/2021 Procedure   EUS/EGD Findings  EGD: The esophagus was normal. The gastric body and antrum  appeared  normal. Evidence of tumor was seen at the pylorus, and extending into the  pyloric channel and the duodenal bulb resulting in severe luminal  narrowing.   EUS was then performed. FNA was performed.   Impression  Successful EUS-guided gastrojejunostomy using a lumen apposing metal stent    10/18/2021 PET scan   IMPRESSION: 1. The known adenocarcinoma involving the distal stomach is hypermetabolic. No definite nodal or hepatic metastases identified. 2. No evidence of distant metastatic disease. 3. Indeterminate left adrenal nodule is unchanged from outside CT and demonstrates no hypermetabolic activity. This measures higher in density than a typical adenoma. Attention on follow-up recommended. 4. Decompressed stomach following gastrojejunostomy. 5. 3.8 cm suprarenal abdominal aortic aneurysm with smaller infrarenal component. Recommend follow-up ultrasound every 2 years. This recommendation follows ACR consensus guidelines: White Paper of the ACR Incidental Findings Committee II on Vascular Findings. J Am Coll Radiol 2013; 10:789-794. Aortic Atherosclerosis (ICD10-I70.0).   11/29/2021 -  Chemotherapy   Patient is on Treatment Plan : GASTROESOPHAGEAL FOLFOX q14d x 6 cycles        INTERVAL HISTORY:  Dillon Smith was contacted for a follow up of gastric cancer. He was last seen by me on 12/27/21. They tell me he returned home from rehab last Thursday. His daughter reports his incision is healing well.  She tells me she is staying with him but cannot stay long term given her own family out of state. The same is the case  for her sister. She explains he needs someone at home full time.   All other systems were reviewed with the patient and are negative.  MEDICAL HISTORY:  Past Medical History:  Diagnosis Date   Bradycardia    CKD (chronic kidney disease), stage III ([place])    Coronary artery disease    CABG 2007   COVID-19 08/2019   Hyperlipidemia LDL goal <70    Hypertension     Persistent atrial fibrillation ([place])    Prostate cancer ([place]) 2010   PVC's (premature ventricular contractions)    Stroke (cerebrum) ([place])     SURGICAL HISTORY: Past Surgical History:  Procedure Laterality Date   COLONOSCOPY  2004   ESOPHAGOGASTRODUODENOSCOPY (EGD) WITH PROPOFOL N/A 11/22/2021   Procedure: ESOPHAGOGASTRODUODENOSCOPY (EGD) WITH PROPOFOL;  Surgeon: Carol Ada, MD;  Location: [facility];  Service: Gastroenterology;  Laterality: N/A;   EUS N/A 11/22/2021   Procedure: UPPER ENDOSCOPIC ULTRASOUND (EUS) RADIAL;  Surgeon: Carol Ada, MD;  Location: [facility];  Service: Gastroenterology;  Laterality: N/A;   INSERTION PROSTATE RADIATION SEED  2010   IR IMAGING GUIDED PORT INSERTION  11/28/2021   LAPAROTOMY N/A 12/31/2021   Procedure: EXPLORATORY LAPAROTOMY;  Surgeon: Jovita Kussmaul, MD;  Location: [facility] ORS;  Service: General;  Laterality: N/A;  DRAINAGE OF INTRAABDOMINAL ABSCESS   stent      I have reviewed the social history and family history with the patient and they are unchanged from previous note.  ALLERGIES:  is allergic to penicillins.  MEDICATIONS:  Current Outpatient Medications  Medication Sig Dispense Refill   amiodarone (PACERONE) 200 MG tablet Take 1 tablet (200 mg total) by mouth daily. 60 tablet 2   apixaban (ELIQUIS) 5 MG TABS tablet Take 1 tablet (5 mg total) by mouth 2 (two) times daily. 60 tablet 2   atorvastatin (LIPITOR) 40 MG tablet Take 1 tablet (40 mg total) by mouth at bedtime. 30 tablet 2   diphenhydrAMINE (BENADRYL) 25 mg capsule Take 1 capsule (25 mg total) by mouth at bedtime. 30 capsule 0   feeding supplement (ENSURE ENLIVE / ENSURE PLUS) LIQD Take 237 mLs by mouth 3 (three) times daily with meals. 237 mL 12   fentaNYL (DURAGESIC) 25 MCG/HR Place 1 patch onto the skin every 3 (three) days. 5 patch 0   HYDROmorphone (DILAUDID) 2 MG tablet Take 1 tablet (2 mg total) by mouth every 4 (four) hours as needed for severe pain. 30 tablet 0    metoprolol succinate (TOPROL-XL) 25 MG 24 hr tablet Take 0.5 tablets (12.5 mg total) by mouth daily. 30 tablet 2   Multiple Vitamin (MULTI VITAMIN DAILY PO) Take 0.5 tablets by mouth daily.     Nystatin (GERHARDT'S BUTT CREAM) CREA Apply 1 Application topically 3 (three) times daily.     ondansetron (ZOFRAN-ODT) 4 MG disintegrating tablet Take 1 tablet (4 mg total) by mouth every 6 (six) hours as needed for nausea. 20 tablet 0   pantoprazole (PROTONIX) 40 MG tablet Take 1 tablet (40 mg total) by mouth at bedtime. 30 tablet 0   phenol (CHLORASEPTIC) 1.4 % LIQD Use as directed 2 sprays in the mouth or throat every 2 (two) hours as needed for throat irritation / pain.  0   QUEtiapine (SEROQUEL) 50 MG tablet Take 1 tablet (50 mg total) by mouth at bedtime. 30 tablet 2   thiamine 100 MG tablet Take 1 tablet (100 mg total) by mouth daily. 30 tablet 0   No current facility-administered medications for this  visit.    PHYSICAL EXAMINATION: ECOG PERFORMANCE STATUS: 3 - Symptomatic, >50% confined to bed  There were no vitals filed for this visit. Wt Readings from Last 3 Encounters:  01/22/22 177 lb 4 oz (80.4 kg)  12/27/21 187 lb 4 oz (84.9 kg)  12/13/21 191 lb 1.9 oz (86.7 kg)     No vitals taken today, Exam not performed today  LABORATORY DATA:  I have reviewed the data as listed    Latest Ref Rng & Units 01/21/2022    3:31 AM 01/20/2022    3:12 AM 01/18/2022    2:22 AM  CBC  WBC 4.0 - 10.5 K/uL 13.4  12.4  13.7   Hemoglobin 13.0 - 17.0 g/dL 7.6  7.6  8.2   Hematocrit 39.0 - 52.0 % 23.5  22.6  25.5   Platelets 150 - 400 K/uL 345  335  389         Latest Ref Rng & Units 01/20/2022    3:12 AM 01/18/2022    2:22 AM 01/17/2022    4:01 AM  CMP  Glucose 70 - 99 mg/dL 79  83  91   BUN 8 - 23 mg/dL 23  28  32   Creatinine 0.61 - 1.24 mg/dL 1.46  1.31  1.29   Sodium 135 - 145 mmol/L 137  141  145   Potassium 3.5 - 5.1 mmol/L 3.8  3.9  3.8   Chloride 98 - 111 mmol/L 108  109  112   CO2 22 -  32 mmol/L 23  26  26    Calcium 8.9 - 10.3 mg/dL 8.2  8.4  8.4   Total Protein 6.5 - 8.1 g/dL   6.9   Total Bilirubin 0.3 - 1.2 mg/dL   0.4   Alkaline Phos 38 - 126 U/L   65   AST 15 - 41 U/L   20   ALT 0 - 44 U/L   28       RADIOGRAPHIC STUDIES: I have personally reviewed the radiological images as listed and agreed with the findings in the report. No results found.    Orders Placed This Encounter  Procedures   Ambulatory referral to Hospice    Referral Priority:   Routine    Referral Type:   Consultation    Referral Reason:   Specialty Services Required    Requested Specialty:   Hospice Services    Number of Visits Requested:   1   All questions were answered. The patient knows to call the clinic with any problems, questions or concerns. No barriers to learning was detected. The total time spent in the appointment was 25 minutes.     [name], MD 02/14/2022   I, Wilburn Mylar, am acting as scribe for [name], MD.   I have reviewed the above documentation for accuracy and completeness, and I agree with the above.

## 2022-02-27 ENCOUNTER — Telehealth: Payer: Self-pay

## 2022-02-27 NOTE — Telephone Encounter (Signed)
This nurse received a call from [employer] with [place].  She states that patients family moved him to [facility].  This is just to make provider aware.  No further questions or concerns at this time.

## 2022-04-01 ENCOUNTER — Emergency Department (HOSPITAL_COMMUNITY): Payer: Medicare Other

## 2022-04-01 ENCOUNTER — Other Ambulatory Visit: Payer: Self-pay

## 2022-04-01 ENCOUNTER — Inpatient Hospital Stay (HOSPITAL_COMMUNITY)
Admission: EM | Admit: 2022-04-01 | Discharge: 2022-04-05 | DRG: 080 | Disposition: A | Discharge status: Hospice | Payer: Medicare Other | Source: Skilled Nursing Facility | Attending: Family Medicine | Admitting: Family Medicine

## 2022-04-01 DIAGNOSIS — I5042 Chronic combined systolic (congestive) and diastolic (congestive) heart failure: Secondary | ICD-10-CM | POA: Diagnosis present

## 2022-04-01 DIAGNOSIS — Z79899 Other long term (current) drug therapy: Secondary | ICD-10-CM

## 2022-04-01 DIAGNOSIS — E162 Hypoglycemia, unspecified: Secondary | ICD-10-CM | POA: Diagnosis present

## 2022-04-01 DIAGNOSIS — Z7901 Long term (current) use of anticoagulants: Secondary | ICD-10-CM

## 2022-04-01 DIAGNOSIS — R4182 Altered mental status, unspecified: Secondary | ICD-10-CM

## 2022-04-01 DIAGNOSIS — N179 Acute kidney failure, unspecified: Secondary | ICD-10-CM | POA: Diagnosis present

## 2022-04-01 DIAGNOSIS — R680 Hypothermia, not associated with low environmental temperature: Secondary | ICD-10-CM | POA: Diagnosis present

## 2022-04-01 DIAGNOSIS — E035 Myxedema coma: Secondary | ICD-10-CM | POA: Diagnosis not present

## 2022-04-01 DIAGNOSIS — I4819 Other persistent atrial fibrillation: Secondary | ICD-10-CM | POA: Diagnosis present

## 2022-04-01 DIAGNOSIS — Z8673 Personal history of transient ischemic attack (TIA), and cerebral infarction without residual deficits: Secondary | ICD-10-CM

## 2022-04-01 DIAGNOSIS — Z8546 Personal history of malignant neoplasm of prostate: Secondary | ICD-10-CM

## 2022-04-01 DIAGNOSIS — Z880 Allergy status to penicillin: Secondary | ICD-10-CM

## 2022-04-01 DIAGNOSIS — R29810 Facial weakness: Secondary | ICD-10-CM | POA: Diagnosis present

## 2022-04-01 DIAGNOSIS — N183 Chronic kidney disease, stage 3 unspecified: Secondary | ICD-10-CM | POA: Diagnosis present

## 2022-04-01 DIAGNOSIS — I2510 Atherosclerotic heart disease of native coronary artery without angina pectoris: Secondary | ICD-10-CM | POA: Diagnosis present

## 2022-04-01 DIAGNOSIS — I4891 Unspecified atrial fibrillation: Secondary | ICD-10-CM | POA: Diagnosis present

## 2022-04-01 DIAGNOSIS — Z8616 Personal history of COVID-19: Secondary | ICD-10-CM

## 2022-04-01 DIAGNOSIS — Z1152 Encounter for screening for COVID-19: Secondary | ICD-10-CM

## 2022-04-01 DIAGNOSIS — C163 Malignant neoplasm of pyloric antrum: Secondary | ICD-10-CM | POA: Diagnosis present

## 2022-04-01 DIAGNOSIS — R001 Bradycardia, unspecified: Secondary | ICD-10-CM | POA: Diagnosis present

## 2022-04-01 DIAGNOSIS — Z66 Do not resuscitate: Secondary | ICD-10-CM | POA: Diagnosis present

## 2022-04-01 DIAGNOSIS — R578 Other shock: Secondary | ICD-10-CM | POA: Diagnosis present

## 2022-04-01 DIAGNOSIS — Z515 Encounter for palliative care: Secondary | ICD-10-CM

## 2022-04-01 DIAGNOSIS — R579 Shock, unspecified: Secondary | ICD-10-CM

## 2022-04-01 DIAGNOSIS — Z951 Presence of aortocoronary bypass graft: Secondary | ICD-10-CM

## 2022-04-01 DIAGNOSIS — I130 Hypertensive heart and chronic kidney disease with heart failure and stage 1 through stage 4 chronic kidney disease, or unspecified chronic kidney disease: Secondary | ICD-10-CM | POA: Diagnosis present

## 2022-04-01 DIAGNOSIS — T68XXXA Hypothermia, initial encounter: Secondary | ICD-10-CM

## 2022-04-01 DIAGNOSIS — E785 Hyperlipidemia, unspecified: Secondary | ICD-10-CM | POA: Diagnosis present

## 2022-04-01 LAB — I-STAT CHEM 8, ED
BUN: 50 mg/dL — ABNORMAL HIGH (ref 8–23)
Calcium, Ion: 1.17 mmol/L (ref 1.15–1.40)
Chloride: 109 mmol/L (ref 98–111)
Creatinine, Ser: 3.4 mg/dL — ABNORMAL HIGH (ref 0.61–1.24)
Glucose, Bld: 187 mg/dL — ABNORMAL HIGH (ref 70–99)
HCT: 32 % — ABNORMAL LOW (ref 39.0–52.0)
Hemoglobin: 10.9 g/dL — ABNORMAL LOW (ref 13.0–17.0)
Potassium: 3.9 mmol/L (ref 3.5–5.1)
Sodium: 140 mmol/L (ref 135–145)
TCO2: 18 mmol/L — ABNORMAL LOW (ref 22–32)

## 2022-04-01 LAB — COMPREHENSIVE METABOLIC PANEL
ALT: 35 U/L (ref 0–44)
AST: 44 U/L — ABNORMAL HIGH (ref 15–41)
Albumin: 2.3 g/dL — ABNORMAL LOW (ref 3.5–5.0)
Alkaline Phosphatase: 80 U/L (ref 38–126)
Anion gap: 14 (ref 5–15)
BUN: 59 mg/dL — ABNORMAL HIGH (ref 8–23)
CO2: 18 mmol/L — ABNORMAL LOW (ref 22–32)
Calcium: 9.2 mg/dL (ref 8.9–10.3)
Chloride: 110 mmol/L (ref 98–111)
Creatinine, Ser: 3.65 mg/dL — ABNORMAL HIGH (ref 0.61–1.24)
GFR, Estimated: 15 mL/min — ABNORMAL LOW (ref >60)
Glucose, Bld: 106 mg/dL — ABNORMAL HIGH (ref 70–99)
Potassium: 4.7 mmol/L (ref 3.5–5.1)
Sodium: 142 mmol/L (ref 135–145)
Total Bilirubin: 0.6 mg/dL (ref 0.3–1.2)
Total Protein: 6.4 g/dL — ABNORMAL LOW (ref 6.5–8.1)

## 2022-04-01 LAB — DIFFERENTIAL
Abs Immature Granulocytes: 0 10*3/uL (ref 0.00–0.07)
Basophils Absolute: 0 10*3/uL (ref 0.0–0.1)
Basophils Relative: 0 %
Eosinophils Absolute: 0 10*3/uL (ref 0.0–0.5)
Eosinophils Relative: 0 %
Lymphocytes Relative: 5 %
Lymphs Abs: 0.3 10*3/uL — ABNORMAL LOW (ref 0.7–4.0)
Monocytes Absolute: 0.1 10*3/uL (ref 0.1–1.0)
Monocytes Relative: 2 %
Neutro Abs: 4.8 10*3/uL (ref 1.7–7.7)
Neutrophils Relative %: 93 %

## 2022-04-01 LAB — PROTIME-INR
INR: 1.8 — ABNORMAL HIGH (ref 0.8–1.2)
Prothrombin Time: 21 seconds — ABNORMAL HIGH (ref 11.4–15.2)

## 2022-04-01 LAB — CBC
HCT: 35.6 % — ABNORMAL LOW (ref 39.0–52.0)
Hemoglobin: 11 g/dL — ABNORMAL LOW (ref 13.0–17.0)
MCH: 31.5 pg (ref 26.0–34.0)
MCHC: 30.9 g/dL (ref 30.0–36.0)
MCV: 102 fL — ABNORMAL HIGH (ref 80.0–100.0)
Platelets: 168 10*3/uL (ref 150–400)
RBC: 3.49 MIL/uL — ABNORMAL LOW (ref 4.22–5.81)
RDW: 19.1 % — ABNORMAL HIGH (ref 11.5–15.5)
WBC: 5.2 10*3/uL (ref 4.0–10.5)
nRBC: 0 % (ref 0.0–0.2)

## 2022-04-01 LAB — CBG MONITORING, ED: Glucose-Capillary: 128 mg/dL — ABNORMAL HIGH (ref 70–99)

## 2022-04-01 LAB — APTT: aPTT: 57 seconds — ABNORMAL HIGH (ref 24–36)

## 2022-04-01 LAB — ETHANOL: Alcohol, Ethyl (B): <10 mg/dL (ref <10)

## 2022-04-01 LAB — RESP PANEL BY RT-PCR (FLU A&B, COVID) ARPGX2
Influenza A by PCR: NEGATIVE
Influenza B by PCR: NEGATIVE
SARS Coronavirus 2 by RT PCR: NEGATIVE

## 2022-04-01 LAB — CORTISOL: Cortisol, Plasma: >100 ug/dL

## 2022-04-01 LAB — LACTIC ACID, PLASMA: Lactic Acid, Venous: 1.2 mmol/L (ref 0.5–1.9)

## 2022-04-01 LAB — TSH: TSH: 176.299 u[IU]/mL — ABNORMAL HIGH (ref 0.350–4.500)

## 2022-04-01 LAB — T4, FREE: Free T4: <0.25 ng/dL — ABNORMAL LOW (ref 0.61–1.12)

## 2022-04-01 MED ORDER — HYDROCORTISONE SOD SUC (PF) 100 MG IJ SOLR
100.0000 mg | Freq: Three times a day (TID) | INTRAMUSCULAR | Status: DC
  Administered 2022-04-02 – 2022-04-03 (×5): 100 mg via INTRAVENOUS
  Filled 2022-04-01 (×6): qty 2

## 2022-04-01 MED ORDER — VANCOMYCIN HCL IN DEXTROSE 1-5 GM/200ML-% IV SOLN: 1000.0000 mg | INTRAVENOUS | Status: DC

## 2022-04-01 MED ORDER — METRONIDAZOLE 500 MG/100ML IV SOLN
500.0000 mg | Freq: Two times a day (BID) | INTRAVENOUS | Status: DC
  Administered 2022-04-01 – 2022-04-03 (×4): 500 mg via INTRAVENOUS
  Filled 2022-04-01 (×4): qty 100

## 2022-04-01 MED ORDER — LACTATED RINGERS IV BOLUS
1000.0000 mL | Freq: Once | INTRAVENOUS | Status: AC
  Administered 2022-04-01: 1000 mL via INTRAVENOUS

## 2022-04-01 MED ORDER — DEXTROSE 50 % IV SOLN
INTRAVENOUS | Status: AC
  Administered 2022-04-01: 50 mL
  Filled 2022-04-01: qty 50

## 2022-04-01 MED ORDER — CHLORHEXIDINE GLUCONATE CLOTH 2 % EX PADS
6.0000 | MEDICATED_PAD | Freq: Every day | CUTANEOUS | Status: DC
  Administered 2022-04-03: 6 via TOPICAL

## 2022-04-01 MED ORDER — VANCOMYCIN HCL 1250 MG/250ML IV SOLN
1250.0000 mg | Freq: Once | INTRAVENOUS | Status: AC
  Administered 2022-04-01: 1250 mg via INTRAVENOUS
  Filled 2022-04-01: qty 250

## 2022-04-01 MED ORDER — LEVOTHYROXINE SODIUM 100 MCG/5ML IV SOLN
50.0000 ug | Freq: Every day | INTRAVENOUS | Status: DC
  Filled 2022-04-01: qty 5

## 2022-04-01 MED ORDER — SODIUM CHLORIDE 0.9% FLUSH: 10.0000 mL | INTRAVENOUS | Status: DC | PRN

## 2022-04-01 MED ORDER — SODIUM CHLORIDE 0.9 % IV BOLUS (SEPSIS)
1000.0000 mL | Freq: Once | INTRAVENOUS | Status: AC
  Administered 2022-04-01: 1000 mL via INTRAVENOUS

## 2022-04-01 MED ORDER — LEVOTHYROXINE SODIUM 100 MCG/5ML IV SOLN
200.0000 ug | Freq: Once | INTRAVENOUS | Status: AC
  Administered 2022-04-01: 200 ug via INTRAVENOUS
  Filled 2022-04-01: qty 10

## 2022-04-01 MED ORDER — HYDROCORTISONE SOD SUC (PF) 100 MG IJ SOLR
100.0000 mg | Freq: Once | INTRAMUSCULAR | Status: AC
  Administered 2022-04-01: 100 mg via INTRAVENOUS
  Filled 2022-04-01: qty 2

## 2022-04-01 MED ORDER — SODIUM CHLORIDE 0.9 % IV SOLN
2.0000 g | Freq: Two times a day (BID) | INTRAVENOUS | Status: DC
  Administered 2022-04-01 – 2022-04-02 (×2): 2 g via INTRAVENOUS
  Filled 2022-04-01 (×2): qty 12.5

## 2022-04-01 MED ORDER — SODIUM CHLORIDE 0.9% FLUSH
10.0000 mL | Freq: Two times a day (BID) | INTRAVENOUS | Status: DC
  Administered 2022-04-01 – 2022-04-05 (×7): 10 mL

## 2022-04-01 NOTE — ED Triage Notes (Signed)
Per EMS patient coming from [facility] [place] home after being found "unresponsive and lethargic" by staff. Approximate LKN is 1100am today. Patient has been found with new left sided facial droop, hypoglycemic, bradycardic, and not able to respond to stimuli. Has hx of a. Fib per EMS. Patient on room air upon arrival.

## 2022-04-01 NOTE — Assessment & Plan Note (Addendum)
Per family discussion with the palliative team on 10/4, transitioning to full comfort care and withdrawing all medical interventions at this time.  Awaiting placement at hospice, preference for [facility]. -Comfort care per palliative, their help is much appreciated

## 2022-04-01 NOTE — Progress Notes (Addendum)
Pharmacy Antibiotic Note  Dillon Smith is a 88 y.o. male admitted on 04/01/2022 with sepsis.  Pharmacy has been consulted for vancomycin and cefepime dosing. Metronidazole per team.   CXR and CT pending. Last weight in July ~80kg. Renal function near baseline.   10/2 Vancomycin '1000mg'$  Q 24 hr Scr used: 1.46 mg/dL Weight: 80 kg Vd coeff: 0.72 L/kg Est AUC: 517  Plan: Cefepime 2g q12hr  Vancomycin 1250 mg x1 load then '1000mg'$  Q24 hr Metronidazole per team  Monitor cultures, clinical status, renal function, vancomycin level Narrow abx as able and f/u duration     Temp (24hrs), Avg:89 F (31.7 C), Min:89 F (31.7 C), Max:89 F (31.7 C)  Recent Labs  Lab 04/01/22 1620  WBC 5.2    CrCl cannot be calculated (Patient's most recent lab result is older than the maximum 21 days allowed.).    Allergies  Allergen Reactions   Penicillins Itching    Antimicrobials this admission: Vanc 10/2 >>  Cefe 10/2 >>  MTZ  10/2   Dose adjustments this admission: N/a  Microbiology results: 10/2 BCx: pend 10/2  UCx: pend   Thank you for allowing pharmacy to be a part of this patient's care.   Benetta Spar, PharmD, BCPS, BCCP Clinical Pharmacist  Please check AMION for all [place] phone numbers After 10:00 PM, call [place] [phone]

## 2022-04-01 NOTE — Assessment & Plan Note (Addendum)
Not a candidate for chemo or radiation. Referred for hospice in 01/2022. DNR this admission.

## 2022-04-01 NOTE — ED Notes (Signed)
MD Schlossman made aware of pts BP.

## 2022-04-01 NOTE — Assessment & Plan Note (Addendum)
Unstable. Bradycardic, hypothermic, tachypneic requiring 4L [place], MAP 61. 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 Management also as above. Daughter/POA declined use of pressors should shock worsen in lieu of current interventions.

## 2022-04-01 NOTE — ED Provider Notes (Signed)
[facility] EMERGENCY DEPARTMENT Provider Note   CSN: [id number] Arrival date & time: 04/01/22  1558     History  Chief Complaint  Patient presents with   Altered Mental Status    Dillon Smith is a 88 y.o. male.  HPI     88yo male with history of pyloric antrum cancer, gastric perforation with admission 7/3-7/25, atrial fibrillation on eliquis, CKD, hyperlipidemia, CHF, hypertension, hyperlipidemia who presents with concern for altered mental status.  Called facility but they did not answer.  Baseline per daughter [name] (who lives out of state) Not able to walk on his own. Generally weak Most days he talks very well, but a few weeks ago prior to her leaving he was sleepy, slurred, like he was drifting off, hx of strokes before, recent dental work, ?slight trembling and drooping on left side  Knows name, location, is hard of hearing and does not have hearing aids at hospital  Per report he has been sleepy, not himself, concern for left facial droop but unknown last known well, she thought it maybe looked off a few weeks ago when she saw him  Has had goal of care discussion as outpatietn, not candidate for surgery or chemotherapy, functional decline, hospice referral. Daughter reports having long discussions with him regarding goals of care and that he was clear about being DNR/DNI and that he also did not want to be kept alive artificially.  Her understanding is that if he were to be so sick he was requiring life support he would want [name] to focus on comfort care.    Past Medical History:  Diagnosis Date   Bradycardia    CKD (chronic kidney disease), stage III ([place])    Coronary artery disease    CABG 2007   COVID-19 08/2019   Hyperlipidemia LDL goal <70    Hypertension    Persistent atrial fibrillation ([place])    Prostate cancer ([place]) 2010   PVC's (premature ventricular contractions)    Stroke (cerebrum) ([place])      Past Surgical History:  Procedure  Laterality Date   COLONOSCOPY  2004   ESOPHAGOGASTRODUODENOSCOPY (EGD) WITH PROPOFOL N/A 11/22/2021   Procedure: ESOPHAGOGASTRODUODENOSCOPY (EGD) WITH PROPOFOL;  Surgeon: Carol Ada, MD;  Location: [facility];  Service: Gastroenterology;  Laterality: N/A;   EUS N/A 11/22/2021   Procedure: UPPER ENDOSCOPIC ULTRASOUND (EUS) RADIAL;  Surgeon: Carol Ada, MD;  Location: [facility];  Service: Gastroenterology;  Laterality: N/A;   INSERTION PROSTATE RADIATION SEED  2010   IR IMAGING GUIDED PORT INSERTION  11/28/2021   LAPAROTOMY N/A 12/31/2021   Procedure: EXPLORATORY LAPAROTOMY;  Surgeon: Jovita Kussmaul, MD;  Location: [facility];  Service: General;  Laterality: N/A;  DRAINAGE OF INTRAABDOMINAL ABSCESS   stent      Home Medications Prior to Admission medications   Medication Sig Start Date End Date Taking? Authorizing Provider  Amino Acids-Protein Hydrolys (FEEDING SUPPLEMENT, PRO-STAT SUGAR FREE 64,) LIQD Take 30 mLs by mouth 2 (two) times daily with a meal.   Yes [provider]  amiodarone (PACERONE) 200 MG tablet Take 1 tablet (200 mg total) by mouth daily. 01/22/22  Yes Bonnielee Haff, MD  apixaban (ELIQUIS) 5 MG TABS tablet Take 1 tablet (5 mg total) by mouth 2 (two) times daily. 01/21/22  Yes Bonnielee Haff, MD  [employer] SENNA 8.6 MG tablet Take 2 tablets by mouth 2 (two) times daily. 03/19/22  Yes [provider]  HYDROmorphone (DILAUDID) 2 MG tablet Take 1 tablet (  2 mg total) by mouth every 4 (four) hours as needed for severe pain. 01/21/22  Yes Bonnielee Haff, MD  metoprolol succinate (TOPROL-XL) 25 MG 24 hr tablet Take 0.5 tablets (12.5 mg total) by mouth daily. Patient taking differently: Take 12.5 mg by mouth in the morning and at bedtime. 01/22/22  Yes Bonnielee Haff, MD  ondansetron (ZOFRAN-ODT) 4 MG disintegrating tablet Take 1 tablet (4 mg total) by mouth every 6 (six) hours as needed for nausea. 01/21/22  Yes Bonnielee Haff, MD  pantoprazole (PROTONIX) 40 MG tablet  Take 1 tablet (40 mg total) by mouth at bedtime. 01/21/22  Yes Bonnielee Haff, MD  QUEtiapine (SEROQUEL) 50 MG tablet Take 1 tablet (50 mg total) by mouth at bedtime. 01/21/22  Yes Bonnielee Haff, MD  atorvastatin (LIPITOR) 40 MG tablet Take 1 tablet (40 mg total) by mouth at bedtime. 08/17/19   Garvin Fila, MD  diphenhydrAMINE (BENADRYL) 25 mg capsule Take 1 capsule (25 mg total) by mouth at bedtime. 01/21/22   Bonnielee Haff, MD  feeding supplement (ENSURE ENLIVE / ENSURE PLUS) LIQD Take 237 mLs by mouth 3 (three) times daily with meals. 01/21/22   Bonnielee Haff, MD  fentaNYL (DURAGESIC) 25 MCG/HR Place 1 patch onto the skin every 3 (three) days. 01/21/22   Bonnielee Haff, MD  Multiple Vitamin (MULTI VITAMIN DAILY PO) Take 0.5 tablets by mouth daily.    [provider]  Nystatin (GERHARDT'S BUTT CREAM) CREA Apply 1 Application topically 3 (three) times daily. 01/21/22   Bonnielee Haff, MD  phenol (CHLORASEPTIC) 1.4 % LIQD Use as directed 2 sprays in the mouth or throat every 2 (two) hours as needed for throat irritation / pain. 01/21/22   Bonnielee Haff, MD  thiamine 100 MG tablet Take 1 tablet (100 mg total) by mouth daily. 01/22/22   Bonnielee Haff, MD      Allergies    Penicillins    Review of Systems   Review of Systems  Physical Exam Updated Vital Signs BP (!) 67/46   Pulse 77   Temp (!) 89.9 F (32.2 C) (Rectal)   Resp 15   SpO2 100%  Physical Exam Vitals and nursing note reviewed.  Constitutional:      General: He is not in acute distress.    Appearance: He is well-developed. He is not diaphoretic.  HENT:     Head: Normocephalic and atraumatic.  Eyes:     Conjunctiva/sclera: Conjunctivae normal.  Cardiovascular:     Rate and Rhythm: Normal rate and regular rhythm.     Heart sounds: Normal heart sounds. No murmur heard.    No friction rub. No gallop.  Pulmonary:     Effort: Pulmonary effort is normal. No respiratory distress.     Breath sounds: Normal  breath sounds. No wheezing or rales.  Abdominal:     General: There is no distension.     Palpations: Abdomen is soft.     Tenderness: There is no abdominal tenderness. There is no guarding.  Musculoskeletal:     Cervical back: Normal range of motion.  Skin:    General: Skin is warm and dry.  Neurological:     Mental Status: He is alert.     Comments: Reports pain with IV stick Difficult to understand, not reliably answering questions Left facial droop, initial concern for possible left upper extremity weakness, however on reexam is not clear and appears symmetric.  Not able to lift bilateral lower extremities Sleepy, intermittently following commands. No gaze deviation  ED Results / Procedures / Treatments   Labs (all labs ordered are listed, but only abnormal results are displayed) Labs Reviewed  PROTIME-INR - Abnormal; Notable for the following components:      Result Value   Prothrombin Time 21.0 (*)    INR 1.8 (*)    All other components within normal limits  APTT - Abnormal; Notable for the following components:   aPTT 57 (*)    All other components within normal limits  CBC - Abnormal; Notable for the following components:   RBC 3.49 (*)    Hemoglobin 11.0 (*)    HCT 35.6 (*)    MCV 102.0 (*)    RDW 19.1 (*)    All other components within normal limits  DIFFERENTIAL - Abnormal; Notable for the following components:   Lymphs Abs 0.3 (*)    All other components within normal limits  COMPREHENSIVE METABOLIC PANEL - Abnormal; Notable for the following components:   CO2 18 (*)    Glucose, Bld 106 (*)    BUN 59 (*)    Creatinine, Ser 3.65 (*)    Total Protein 6.4 (*)    Albumin 2.3 (*)    AST 44 (*)    GFR, Estimated 15 (*)    All other components within normal limits  TSH - Abnormal; Notable for the following components:   TSH 176.299 (*)    All other components within normal limits  T4, FREE - Abnormal; Notable for the following components:   Free T4 <0.25  (*)    All other components within normal limits  I-STAT CHEM 8, ED - Abnormal; Notable for the following components:   BUN 50 (*)    Creatinine, Ser 3.40 (*)    Glucose, Bld 187 (*)    TCO2 18 (*)    Hemoglobin 10.9 (*)    HCT 32.0 (*)    All other components within normal limits  CBG MONITORING, ED - Abnormal; Notable for the following components:   Glucose-Capillary 128 (*)    All other components within normal limits  RESP PANEL BY RT-PCR (FLU A&B, COVID) ARPGX2  CULTURE, BLOOD (ROUTINE X 2)  CULTURE, BLOOD (ROUTINE X 2)  ETHANOL  LACTIC ACID, PLASMA  CORTISOL  T4, FREE  T3  BASIC METABOLIC PANEL  I-STAT CHEM 8, ED    EKG EKG Interpretation  Date/Time:  Monday April 01 2022 17:22:53 EDT Ventricular Rate:  44 PR Interval:    QRS Duration: 152 QT Interval:  597 QTC Calculation: 511 R Axis:   70 Text Interpretation: Atrial fibrillation Nonspecific intraventricular conduction delay Probable anteroseptal infarct, old Since prior ECG, rate has slowed Confirmed by Gareth Morgan [id number]) on 04/01/2022 6:53:48 PM  Radiology CT Head Wo Contrast  Result Date: 04/01/2022 CLINICAL DATA:  Unresponsive and lethargic. EXAM: CT HEAD WITHOUT CONTRAST TECHNIQUE: Contiguous axial images were obtained from the base of the skull through the vertex without intravenous contrast. RADIATION DOSE REDUCTION: This exam was performed according to the departmental dose-optimization program which includes automated exposure control, adjustment of the mA and/or kV according to patient size and/or use of iterative reconstruction technique. COMPARISON:  August 18, 2019 FINDINGS: Brain: There is mild cerebral atrophy with widening of the extra-axial spaces and ventricular dilatation. There are areas of decreased attenuation within the white matter tracts of the supratentorial brain, consistent with microvascular disease changes. A small chronic right temporoparietal lobe infarct is noted. Vascular: No  hyperdense vessel or unexpected calcification. Skull: Normal. Negative for  fracture or focal lesion. Sinuses/Orbits: No acute finding. Other: None. IMPRESSION: 1. No acute intracranial abnormality. 2. Small chronic right temporoparietal lobe infarct. 3. Generalized cerebral atrophy and microvascular disease changes of the supratentorial brain. Electronically Signed   By: Virgina Norfolk M.D.   On: 04/01/2022 19:34   DG Chest Portable 1 View  Result Date: 04/01/2022 CLINICAL DATA:  Unresponsive, lethargic EXAM: PORTABLE CHEST 1 VIEW COMPARISON:  01/07/2022, 01/04/2022 FINDINGS: Single frontal view of the chest demonstrates stable right chest wall port. Cardiac silhouette is unremarkable. No airspace disease, effusion, or pneumothorax. Stable birdshot left chest wall. No acute bony abnormalities. IMPRESSION: 1. No acute intrathoracic process. Electronically Signed   By: Randa Ngo M.D.   On: 04/01/2022 18:08    Procedures .Critical Care  Performed by: Gareth Morgan, MD Authorized by: Gareth Morgan, MD   Critical care provider statement:    Critical care time (minutes):  60   Critical care was time spent personally by me on the following activities:  Development of treatment plan with patient or surrogate, discussions with consultants, evaluation of patient's response to treatment, examination of patient, ordering and review of laboratory studies, ordering and review of radiographic studies, ordering and performing treatments and interventions, pulse oximetry, re-evaluation of patient's condition and review of old charts     Medications Ordered in ED Medications  sodium chloride flush (NS) 0.9 % injection 10-40 mL (10 mLs Intracatheter Given 04/01/22 2105)  sodium chloride flush (NS) 0.9 % injection 10-40 mL (has no administration in time range)  Chlorhexidine Gluconate Cloth 2 % PADS 6 each (0 each Topical Hold 04/01/22 1735)  metroNIDAZOLE (FLAGYL) IVPB 500 mg (0 mg Intravenous Stopped  04/01/22 1852)  ceFEPIme (MAXIPIME) 2 g in sodium chloride 0.9 % 100 mL IVPB (0 g Intravenous Stopped 04/01/22 1816)  vancomycin (VANCOCIN) IVPB 1000 mg/200 mL premix (has no administration in time range)  hydrocortisone sodium succinate (SOLU-CORTEF) 100 MG injection 100 mg (has no administration in time range)  levothyroxine (SYNTHROID, LEVOTHROID) injection 50 mcg (has no administration in time range)  dextrose 50 % solution (50 mLs  Given 04/01/22 1657)  lactated ringers bolus 1,000 mL (0 mLs Intravenous Stopped 04/01/22 2019)  vancomycin (VANCOREADY) IVPB 1250 mg/250 mL (0 mg Intravenous Stopped 04/01/22 2019)  lactated ringers bolus 1,000 mL (0 mLs Intravenous Stopped 04/01/22 2106)  sodium chloride 0.9 % bolus 1,000 mL (0 mLs Intravenous Stopped 04/01/22 2019)  hydrocortisone sodium succinate (SOLU-CORTEF) 100 MG injection 100 mg (100 mg Intravenous Given 04/01/22 1822)  levothyroxine (SYNTHROID, LEVOTHROID) injection 200 mcg (200 mcg Intravenous Given 04/01/22 1943)    ED Course/ Medical Decision Making/ A&P                            88yo male with history of pyloric antrum cancer, gastric perforation with admission 7/3-7/25, atrial fibrillation on eliquis, CKD, hyperlipidemia, CHF, hypertension, hyperlipidemia who presents with concern for altered mental status.  Arrives with hypoglycemia, initial BP 630 systolic, bradycardia, hypothermia.  DDx includes sepsis, stroke, myxedema coma, ICH, hypoglycemia, anemia, other electrolyte abnormalities.  Sepsis being a possibility with his hypothermia, gave empiric antibiotics.  Developed worsening hypotension and ordered 3L fluid.  Chest x-ray showed no sign of pneumonia on my personal evaluation.  CT head completed and interpreted by me and radiology without acute findings.  In setting of multiple other acute medical problems, it is likely his symptoms are due to medical disease or stroke recrudescence rather  than acute infarct.   Labs completed  and were personally evaluated interpreted by me showed an acute kidney injury with a creatinine of 3.65 from previous of 1.46, bicarb of 18, hemoglobin of 11, negative COVID and flu testing, normal lactic acid.    TSH 176, free T4 less than .25.  These labs with hypoglycemia, hypothermia, bradycardia, neurologic changes, and now hypotension consistent with myxedema coma. Ordered hydrocortisone, levothyroxine.    Blood pressures have continued to decrease. Had goals of care discussion with both daughter who is out of state over the phone and brother.  They are both in agreement that he would not want aggressive resuscitation or pressors.  They agree to continued medications at this time with overall focus and priority of comfort with understanding of risk/benefit of comfort/pain medications.  He appears comfortable at this time. Will admit to [facility] for continued care.         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

## 2022-04-01 NOTE — Assessment & Plan Note (Addendum)
Admission EKG with bradycardia. Holding eliquis in setting of incomplete med rec and overall GOC this admission.

## 2022-04-01 NOTE — ED Notes (Signed)
No orders at this time for BP, pt is comfort care per DO Espinoza.

## 2022-04-01 NOTE — ED Notes (Signed)
Brother Delfino Lovett wishes to be person of contact. Contact information updated in chart.

## 2022-04-01 NOTE — ED Notes (Signed)
Per EMS Alondra Sahni is patient's healthcare power of attorney but lives in [place]. Per EMS niece is closest by. Per EMS patient has an active DNR but the facility claims that they lost it.

## 2022-04-01 NOTE — Assessment & Plan Note (Addendum)
Cr 3.65 on admission, baseline around 1.2-1.4. Suspect myxedema and resulting shock/systemic organ dysfunction as etiology. Continue management as above.

## 2022-04-01 NOTE — ED Notes (Signed)
Pt transported to CT by this RN.

## 2022-04-01 NOTE — ED Notes (Signed)
Bair hugger placed on pt.

## 2022-04-01 NOTE — ED Notes (Signed)
MD Mabe and DO Espinoza made aware of pts BP.

## 2022-04-01 NOTE — ED Notes (Signed)
Unable to get accurate BP due to pts agitation and moving around and bending arm. MD Schlossman made aware.

## 2022-04-01 NOTE — H&P (Addendum)
Hospital Admission History and Physical Service Pager: [phone]  Patient name: Dillon Smith Medical record number: [id number] Date of Birth: 09-19-1928 Age: 88 y.o. Gender: male  Primary Care Provider: [name] Consultants: None Code Status: DNR Preferred Emergency Contact: Reine Just (daughter and Chauncey Reading), [phone]   Chief Complaint: AMS  Assessment and Plan: LARRON ARMOR is a 88 y.o. male presenting with AMS, shock likely in the setting of myxedema coma. Differential for this patient's presentation of this includes sepsis, pneumonia, UTI, amiodarone side effect, and stroke.  * 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HCC) Markedly elevated TSH in setting of multiple organ dysfunction c/w myxedema coma. Suspect amiodarone use (initiated in 12/2021) vs infection as etiology. Pulmonary infection and stroke less likely given imaging findings. Blood or urine infections could provide source for precipitation of condition. While awaiting results of Bcx and Ucx, will continue levothyroxine, hydrocortisone, and abx for treatment of current condition. Patient's POA contacted and agrees to plan of care for above interventions, though she would like patient to remain DNR. -Admit to [facility], attending Dr. Ardelia Mems, for mainly palliation. Daughter lives out of state and is planning to come see patient. Anticipate that he is likely nearing end of life; mortality for myxedema coma is 40%, discussed overall poor prognosis  -S/p 200 mcg levo IV, will proceed with 50 mcg levothyroxine IV daily until PO, if possible -T4 and T3 daily to assess levo response and prevent T3 overload -S/p 100 mg IV, will proceed with 100 mg hydrocortisone Q8 for 48 hours -Cefepime 2g IV Q12 -Flagyl '500mg'$  IV Q12 -Vancomycin '1000mg'$  IV daily -IVF with LR at 150 mL/hr ; s/p 2L LR boluses -Continue ba -F/u Bcx, Ucx -Cardiac monitoring, vitals, O2 -Continue bear hugger -IV Dilaudid 0.5 mg q2h PRN moderate or severe  pain -NPO -Daughter confirmed would like to avoid aggressive measures such as pressors, escalation to ICU. Would like to treat with measures above and keep patient comfortable -Palliative consulted  Shock ([place]) Unstable. Bradycardic, hypothermic, tachypneic requiring 4L [place], MAP 61. 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 Management also as above. Daughter/POA declined use of pressors should shock worsen in lieu of current interventions.  AKI (acute kidney injury) ([place]) Cr 3.65, baseline around 1.2-1.4. Suspect myxedema and resulting shock/systemic organ dysfunction as etiology. Continue management as above.  Atrial fibrillation ([place]) Admission EKG with bradycardia. Holding eliquis in setting of incomplete med rec and overall GOC this admission.  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 Referred for hospice in 01/2022. DNR this admission.   FEN/GI: NPO VTE Prophylaxis: None  Disposition: Progressive  History of Present Illness:  Dillon Smith is a 88 y.o. male presenting with AMS, shock, and myxedema coma from his [facility].  Patient is unable to provide any verbal history. However, he is able to squeeze my hands when asked if he is in pain. Appears to have presented from nursing home after being found unresponsive and lethargic per ED triage note. LKN 11 AM today with hypoglycemia, bradycardia, and left-sided facial droop. His daughter [name], who is his POA, was contacted and updated on critical status. His daughter would like for treatment to be initiated. She would, however, like for him to pass without interventions should that occur.  In the ED, patient found to have markedly elevated TSH and shock. IV levo given in addition to hydrocortisone. Antibiotics, Bcx, Ucx collected for presumed septic shock. CT head and CXR collected without remarkable findings for current presentation.  Review Of Systems: Unable to assess  Pertinent Past Medical  History: Past Medical History:  Diagnosis Date   Bradycardia    CKD (chronic kidney disease), stage III ([place])    Coronary artery disease    CABG 2007   COVID-19 08/2019   Hyperlipidemia LDL goal <70    Hypertension    Persistent atrial fibrillation ([place])    Prostate cancer ([place]) 2010   PVC's (premature ventricular contractions)    Stroke (cerebrum) [facility])     Pertinent Past Surgical History: Past Surgical History:  Procedure Laterality Date   COLONOSCOPY  2004   ESOPHAGOGASTRODUODENOSCOPY (EGD) WITH PROPOFOL N/A 11/22/2021   Procedure: ESOPHAGOGASTRODUODENOSCOPY (EGD) WITH PROPOFOL;  Surgeon: Carol Ada, MD;  Location: [facility];  Service: Gastroenterology;  Laterality: N/A;   EUS N/A 11/22/2021   Procedure: UPPER ENDOSCOPIC ULTRASOUND (EUS) RADIAL;  Surgeon: Carol Ada, MD;  Location: [facility];  Service: Gastroenterology;  Laterality: N/A;   INSERTION PROSTATE RADIATION SEED  2010   IR IMAGING GUIDED PORT INSERTION  11/28/2021   LAPAROTOMY N/A 12/31/2021   Procedure: EXPLORATORY LAPAROTOMY;  Surgeon: Jovita Kussmaul, MD;  Location: [facility];  Service: General;  Laterality: N/A;  DRAINAGE OF INTRAABDOMINAL ABSCESS   stent       Pertinent Social History: Tobacco use: Former, <1 pack per week as adult and quit in 1976 Alcohol use: Not currently Other Substance use: Not currently  Lives in [place]  Pertinent Family History: Reviewed in history tab, unremarkable.  Important Outpatient Medications: Current Meds  Medication Sig   Amino Acids-Protein Hydrolys (FEEDING SUPPLEMENT, PRO-STAT SUGAR FREE 64,) LIQD Take 30 mLs by mouth 2 (two) times daily with a meal.   amiodarone (PACERONE) 200 MG tablet Take 1 tablet (200 mg total) by mouth daily.   apixaban (ELIQUIS) 5 MG TABS tablet Take 1 tablet (5 mg total) by mouth 2 (two) times daily.   [employer] SENNA 8.6 MG tablet Take 2 tablets by mouth 2 (two) times daily.   HYDROmorphone (DILAUDID) 2 MG tablet Take 1 tablet  (2 mg total) by mouth every 4 (four) hours as needed for severe pain.   metoprolol succinate (TOPROL-XL) 25 MG 24 hr tablet Take 0.5 tablets (12.5 mg total) by mouth daily. (Patient taking differently: Take 12.5 mg by mouth in the morning and at bedtime.)   ondansetron (ZOFRAN-ODT) 4 MG disintegrating tablet Take 1 tablet (4 mg total) by mouth every 6 (six) hours as needed for nausea.   pantoprazole (PROTONIX) 40 MG tablet Take 1 tablet (40 mg total) by mouth at bedtime.   QUEtiapine (SEROQUEL) 50 MG tablet Take 1 tablet (50 mg total) by mouth at bedtime.  Remainder reviewed in medication history.   Objective: BP (!) 67/46   Pulse 77   Temp 97.8 F (36.6 C) (Axillary) Comment: Bear hugger DC  Resp 15   SpO2 100%  Exam: General: Eyes open without goal-directed movement, continuous sturdor throughout exam, elderly, chronically ill appearing Skin: Warm, dry, and intact; linear, well-healed scars on chest and abdomen HEENT: NCAT, large and soft goiter midline, no conjunctival injection, + left facial droop, appreciable mucus in upper airway with sturdor Cardiac: Difficult to appreciate given sturdor Respiratory: Difficult to appreciate given sturdor, 4L [place] Abdominal: Soft, non-distended, bowel sounds not appreciated Extremities: Slightly moves upper extremities when asked, squeezes examiner's fingers when asked if pain, unable to lift b/l LE against gravity but able to slightly plantarflex both feet  Neurological: Minimally responsive, unable to verbalize thoughts or  concerns, strength 4/5 in bilateral hand grip, sensation intact as evidenced by pain with palpation of bilateral LE   Goiter  Labs:  CBC BMET  Recent Labs  Lab 04/01/22 1620 04/01/22 1734  WBC 5.2  --   HGB 11.0* 10.9*  HCT 35.6* 32.0*  PLT 168  --    Recent Labs  Lab 04/01/22 1620 04/01/22 1734  NA 142 140  K 4.7 3.9  CL 110 109  CO2 18*  --   BUN 59* 50*  CREATININE 3.65* 3.40*  GLUCOSE 106* 187*  CALCIUM  9.2  --      TSH: 176.299 T4: <0.25 LA: 1.2 Cortisol >100 (checked after IV Hydrocortisone given) Bcx, Ucx in progress  EKG: afib with 44 BPM, no STEMI or BBB  Imaging Studies Performed:  CXR IMPRESSION: 1. No acute intrathoracic process.  CT head without contrast IMPRESSION: 1. No acute intracranial abnormality. 2. Small chronic right temporoparietal lobe infarct. 3. Generalized cerebral atrophy and microvascular disease changes of the supratentorial brain.  Ethelene Hal, MD 04/02/2022, 10:02 PM PGY-1, [facility] pager: [phone], text pages welcome Secure chat group [place] Upper-Level Resident Addendum   I have independently interviewed and examined the patient. I have discussed the above with the original author and agree with their documentation. My edits for correction/addition/clarification are included where appropriate. Please see also any attending notes.   Sharion Settler, DO PGY-3, [facility] 04/02/2022 1:25 AM  FPTS Service pager: [phone] (text pages welcome through [facility])

## 2022-04-02 DIAGNOSIS — I5042 Chronic combined systolic (congestive) and diastolic (congestive) heart failure: Secondary | ICD-10-CM | POA: Diagnosis present

## 2022-04-02 DIAGNOSIS — R4182 Altered mental status, unspecified: Secondary | ICD-10-CM | POA: Diagnosis not present

## 2022-04-02 DIAGNOSIS — Z66 Do not resuscitate: Secondary | ICD-10-CM | POA: Diagnosis present

## 2022-04-02 DIAGNOSIS — R680 Hypothermia, not associated with low environmental temperature: Secondary | ICD-10-CM | POA: Diagnosis present

## 2022-04-02 DIAGNOSIS — I2510 Atherosclerotic heart disease of native coronary artery without angina pectoris: Secondary | ICD-10-CM | POA: Diagnosis present

## 2022-04-02 DIAGNOSIS — Z8673 Personal history of transient ischemic attack (TIA), and cerebral infarction without residual deficits: Secondary | ICD-10-CM | POA: Diagnosis not present

## 2022-04-02 DIAGNOSIS — I130 Hypertensive heart and chronic kidney disease with heart failure and stage 1 through stage 4 chronic kidney disease, or unspecified chronic kidney disease: Secondary | ICD-10-CM | POA: Diagnosis present

## 2022-04-02 DIAGNOSIS — I4819 Other persistent atrial fibrillation: Secondary | ICD-10-CM

## 2022-04-02 DIAGNOSIS — E162 Hypoglycemia, unspecified: Secondary | ICD-10-CM | POA: Diagnosis present

## 2022-04-02 DIAGNOSIS — E035 Myxedema coma: Secondary | ICD-10-CM | POA: Diagnosis present

## 2022-04-02 DIAGNOSIS — Z951 Presence of aortocoronary bypass graft: Secondary | ICD-10-CM | POA: Diagnosis not present

## 2022-04-02 DIAGNOSIS — Z1152 Encounter for screening for COVID-19: Secondary | ICD-10-CM | POA: Diagnosis not present

## 2022-04-02 DIAGNOSIS — N179 Acute kidney failure, unspecified: Secondary | ICD-10-CM | POA: Diagnosis present

## 2022-04-02 DIAGNOSIS — Z79899 Other long term (current) drug therapy: Secondary | ICD-10-CM | POA: Diagnosis not present

## 2022-04-02 DIAGNOSIS — Z8546 Personal history of malignant neoplasm of prostate: Secondary | ICD-10-CM | POA: Diagnosis not present

## 2022-04-02 DIAGNOSIS — N183 Chronic kidney disease, stage 3 unspecified: Secondary | ICD-10-CM | POA: Diagnosis present

## 2022-04-02 DIAGNOSIS — Z7901 Long term (current) use of anticoagulants: Secondary | ICD-10-CM | POA: Diagnosis not present

## 2022-04-02 DIAGNOSIS — C163 Malignant neoplasm of pyloric antrum: Secondary | ICD-10-CM | POA: Diagnosis present

## 2022-04-02 DIAGNOSIS — T68XXXA Hypothermia, initial encounter: Secondary | ICD-10-CM | POA: Diagnosis not present

## 2022-04-02 DIAGNOSIS — Z515 Encounter for palliative care: Secondary | ICD-10-CM | POA: Diagnosis not present

## 2022-04-02 DIAGNOSIS — Z8616 Personal history of COVID-19: Secondary | ICD-10-CM | POA: Diagnosis not present

## 2022-04-02 DIAGNOSIS — Z880 Allergy status to penicillin: Secondary | ICD-10-CM | POA: Diagnosis not present

## 2022-04-02 DIAGNOSIS — R29810 Facial weakness: Secondary | ICD-10-CM | POA: Diagnosis present

## 2022-04-02 DIAGNOSIS — R578 Other shock: Secondary | ICD-10-CM | POA: Diagnosis present

## 2022-04-02 DIAGNOSIS — E785 Hyperlipidemia, unspecified: Secondary | ICD-10-CM | POA: Diagnosis present

## 2022-04-02 DIAGNOSIS — R001 Bradycardia, unspecified: Secondary | ICD-10-CM | POA: Diagnosis present

## 2022-04-02 LAB — CBG MONITORING, ED
Glucose-Capillary: 58 mg/dL — ABNORMAL LOW (ref 70–99)
Glucose-Capillary: 60 mg/dL — ABNORMAL LOW (ref 70–99)
Glucose-Capillary: 60 mg/dL — ABNORMAL LOW (ref 70–99)
Glucose-Capillary: 121 mg/dL — ABNORMAL HIGH (ref 70–99)

## 2022-04-02 LAB — BLOOD CULTURE ID PANEL (REFLEXED) - BCID2

## 2022-04-02 LAB — BASIC METABOLIC PANEL
Anion gap: 10 (ref 5–15)
BUN: 56 mg/dL — ABNORMAL HIGH (ref 8–23)
CO2: 17 mmol/L — ABNORMAL LOW (ref 22–32)
Calcium: 8.3 mg/dL — ABNORMAL LOW (ref 8.9–10.3)
Chloride: 113 mmol/L — ABNORMAL HIGH (ref 98–111)
Creatinine, Ser: 3.55 mg/dL — ABNORMAL HIGH (ref 0.61–1.24)
GFR, Estimated: 16 mL/min — ABNORMAL LOW (ref >60)
Glucose, Bld: 98 mg/dL (ref 70–99)
Potassium: 3.7 mmol/L (ref 3.5–5.1)
Sodium: 140 mmol/L (ref 135–145)

## 2022-04-02 LAB — T4, FREE: Free T4: 0.62 ng/dL (ref 0.61–1.12)

## 2022-04-02 LAB — MRSA NEXT GEN BY PCR, NASAL: MRSA by PCR Next Gen: NOT DETECTED

## 2022-04-02 MED ORDER — LACTATED RINGERS IV BOLUS
1000.0000 mL | Freq: Once | INTRAVENOUS | Status: AC
  Administered 2022-04-02: 1000 mL via INTRAVENOUS

## 2022-04-02 MED ORDER — VANCOMYCIN VARIABLE DOSE PER UNSTABLE RENAL FUNCTION (PHARMACIST DOSING): Status: DC

## 2022-04-02 MED ORDER — LEVOTHYROXINE SODIUM 100 MCG/5ML IV SOLN
50.0000 ug | Freq: Every day | INTRAVENOUS | Status: DC
  Administered 2022-04-02 – 2022-04-03 (×2): 50 ug via INTRAVENOUS
  Filled 2022-04-02 (×2): qty 5

## 2022-04-02 MED ORDER — HYDROMORPHONE HCL 1 MG/ML IJ SOLN
0.5000 mg | INTRAMUSCULAR | Status: DC | PRN
  Administered 2022-04-02 – 2022-04-03 (×2): 0.5 mg via INTRAVENOUS
  Filled 2022-04-02: qty 1
  Filled 2022-04-02: qty 0.5

## 2022-04-02 MED ORDER — DEXTROSE IN LACTATED RINGERS 5 % IV SOLN: INTRAVENOUS | Status: AC

## 2022-04-02 MED ORDER — LACTATED RINGERS IV SOLN: INTRAVENOUS | Status: DC

## 2022-04-02 MED ORDER — SODIUM CHLORIDE 0.9 % IV SOLN
2.0000 g | INTRAVENOUS | Status: DC
  Administered 2022-04-03: 2 g via INTRAVENOUS
  Filled 2022-04-02: qty 12.5

## 2022-04-02 NOTE — Progress Notes (Signed)
PHARMACY - PHYSICIAN COMMUNICATION CRITICAL VALUE ALERT - BLOOD CULTURE IDENTIFICATION (BCID)  Dillon Smith is an 88 y.o. male who presented to [facility] on 04/01/2022 with a chief complaint of AMS, hypothermia, bradycardia.   Assessment:  Blood cultures 1/2 staphylococcus epidermidis (only one set drawn), likely contaminant   Name of physician (or Provider) Contacted: Dr. Christene Slates   Current antibiotics: Cefepime and Vancomycin   Changes to prescribed antibiotics recommended:  Patient is on recommended antibiotics - No changes needed  Results for orders placed or performed during the hospital encounter of 04/01/22  Blood Culture ID Panel (Reflexed) (Collected: 04/01/2022  4:20 PM)  Result Value Ref Range   Enterococcus faecalis NOT DETECTED NOT DETECTED   Enterococcus Faecium NOT DETECTED NOT DETECTED   Listeria monocytogenes NOT DETECTED NOT DETECTED   Staphylococcus species DETECTED (A) NOT DETECTED   Staphylococcus aureus (BCID) NOT DETECTED NOT DETECTED   Staphylococcus epidermidis DETECTED (A) NOT DETECTED   Staphylococcus lugdunensis NOT DETECTED NOT DETECTED   Streptococcus species NOT DETECTED NOT DETECTED   Streptococcus agalactiae NOT DETECTED NOT DETECTED   Streptococcus pneumoniae NOT DETECTED NOT DETECTED   Streptococcus pyogenes NOT DETECTED NOT DETECTED   A.calcoaceticus-baumannii NOT DETECTED NOT DETECTED   Bacteroides fragilis NOT DETECTED NOT DETECTED   Enterobacterales NOT DETECTED NOT DETECTED   Enterobacter cloacae complex NOT DETECTED NOT DETECTED   Escherichia coli NOT DETECTED NOT DETECTED   Klebsiella aerogenes NOT DETECTED NOT DETECTED   Klebsiella oxytoca NOT DETECTED NOT DETECTED   Klebsiella pneumoniae NOT DETECTED NOT DETECTED   Proteus species NOT DETECTED NOT DETECTED   Salmonella species NOT DETECTED NOT DETECTED   Serratia marcescens NOT DETECTED NOT DETECTED   Haemophilus influenzae NOT DETECTED NOT DETECTED   Neisseria  meningitidis NOT DETECTED NOT DETECTED   Pseudomonas aeruginosa NOT DETECTED NOT DETECTED   Stenotrophomonas maltophilia NOT DETECTED NOT DETECTED   Candida albicans NOT DETECTED NOT DETECTED   Candida auris NOT DETECTED NOT DETECTED   Candida glabrata NOT DETECTED NOT DETECTED   Candida krusei NOT DETECTED NOT DETECTED   Candida parapsilosis NOT DETECTED NOT DETECTED   Candida tropicalis NOT DETECTED NOT DETECTED   Cryptococcus neoformans/gattii NOT DETECTED NOT DETECTED   Methicillin resistance mecA/C NOT DETECTED NOT DETECTED   Francena Hanly, PharmD Pharmacy Resident  04/02/2022 5:24 PM

## 2022-04-02 NOTE — ED Notes (Signed)
Ardelia Mems MD made aware of pts temp of 95.3. Per MD, RN to place pt on bair hugger. Pt placed on bair hugger on high setting.

## 2022-04-02 NOTE — Hospital Course (Addendum)
Dillon Smith is a 88 y.o.male with a history of CKD, CAD s/p CABG in 2007, HLD, HTN, A-fib, prostate cancer, CVA who was admitted to the [facility] Medicine Teaching Service at [facility] for myxedema coma. His hospital course is detailed below:   Myxedema Coma, comfort care Presented with altered mental status and shock with hypotension, bradycardia, hypothermia.  Found to have significantly elevated TSH and low T4.  Started on IV levothyroxine, hydrocortisone.  Also started on cefepime, Flagyl due to concern for possible sepsis.  Received vancomycin as well but this was discontinued due to AKI and a negative MRSA swab.  Of note, the patient's POA requested that the patient not receive any pressors or escalation of care, and the patient is DNR/DNI.  For this reason the patient was not admitted to ICU.  Upon goals of care discussion with daughter Reine Just [facility]), the decision was made to move to full comfort care on 10/4.  Upon this decision, all medical interventions were withdrawn and the patient was started on palliative measures.  Other chronic conditions were medically managed with home medications and formulary alternatives as necessary  A-fib-Eliquis was held due to [facility]

## 2022-04-02 NOTE — ED Notes (Signed)
ED TO INPATIENT HANDOFF REPORT  ED Nurse Name and Phone #: Jeannie Done Langford Name/Age/Gender Wonda Horner 88 y.o. male Room/Bed: 029C/029C  Code Status   Code Status: DNR  Home/SNF/Other Nursing Home [facility]) Patient oriented to: GCS 9 Is this baseline?  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 Approximate LKN is 1100am today. Patient has been found with new left sided facial droop, hypoglycemic, bradycardic, and not able to respond to stimuli. Has hx of a. Fib per EMS. Patient on room air upon arrival.    Allergies Allergies  Allergen Reactions   Penicillins Itching    Level of Care/Admitting Diagnosis ED Disposition     ED Disposition  Admit   Condition  --   [place]: [place] [[id number]]  Level of Care: Progressive [102]  Admit to Progressive based on following criteria: MULTISYSTEM THREATS such as stable sepsis, metabolic/electrolyte imbalance with or without encephalopathy that is responding to early treatment.  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[facility]) [[id number]]  Admitting Physician: Jacelyn Grip [[id number]]  Attending Physician: Leeanne Rio [[id number]]          B Medical/Surgery History Past Medical History:  Diagnosis Date   Bradycardia    CKD (chronic kidney disease), stage III ([place])    Coronary artery disease    CABG 2007   COVID-19 08/2019   Hyperlipidemia LDL goal <70    Hypertension    Persistent atrial fibrillation ([place])    Prostate cancer ([place]) 2010   PVC's (premature ventricular contractions)    Stroke (cerebrum) ([place])    Past Surgical History:   Procedure Laterality Date   COLONOSCOPY  2004   ESOPHAGOGASTRODUODENOSCOPY (EGD) WITH PROPOFOL N/A 11/22/2021   Procedure: ESOPHAGOGASTRODUODENOSCOPY (EGD) WITH PROPOFOL;  Surgeon: Carol [facility], MD;  Location: [facility];  Service: Gastroenterology;  Laterality: N/A;   EUS N/A 11/22/2021   Procedure: UPPER ENDOSCOPIC ULTRASOUND (EUS) RADIAL;  Surgeon: Carol [facility], MD;  Location: [facility];  Service: Gastroenterology;  Laterality: N/A;   INSERTION PROSTATE RADIATION SEED  2010   IR IMAGING GUIDED PORT INSERTION  11/28/2021   LAPAROTOMY N/A 12/31/2021   Procedure: EXPLORATORY LAPAROTOMY;  Surgeon: Jovita Kussmaul, MD;  Location: [facility] ORS;  Service: General;  Laterality: N/A;  DRAINAGE OF INTRAABDOMINAL ABSCESS   stent       A IV Location/Drains/Wounds Patient Lines/Drains/Airways Status     Active Line/Drains/Airways     Name Placement date Placement time Site Days   Implanted Port 11/28/21 Right Chest 11/28/21  1414  Chest  125   Peripheral IV 04/01/22 20 G Anterior;Right Forearm 04/01/22  1858  Forearm  1   External Urinary Catheter 04/01/22  1545  --  1   Incision (Closed) 12/31/21 Abdomen 12/31/21  1654  -- 92   Wound / Incision (Open or Dehisced) 01/11/22 (IAD) Incontinence Associated Dermatitis Buttocks Bilateral;Upper 01/11/22  1158  Buttocks  81   Wound / Incision (Open or Dehisced) 01/22/22 (MARSI) Medical Adhesive-Related Skin Injury Penis Right;Left;Medial 01/22/22  0832  Penis  70            Intake/Output Last 24 hours  Intake/Output Summary (Last  24 hours) at 04/02/2022 1214 Last data filed at 04/02/2022 0251 Gross per 24 hour  Intake 4000 ml  Output --  Net 4000 ml    Labs/Imaging Results for orders placed or performed during the hospital encounter of 04/01/22 (from the past 48 hour(s))  CBG monitoring, ED     Status: Abnormal   Collection Time: 04/01/22  4:03 PM  Result Value Ref Range   Glucose-Capillary 58 (L) 70 - 99 mg/dL    Comment: Glucose reference  range applies only to samples taken after fasting for at least 8 hours.  Ethanol     Status: None   Collection Time: 04/01/22  4:20 PM  Result Value Ref Range   Alcohol, Ethyl (B) <10 <10 mg/dL    Comment: (NOTE) Lowest detectable limit for serum alcohol is 10 mg/dL.  For medical purposes only. Performed at [facility], [place] [address] [place], [place] [address]   Protime-INR     Status: Abnormal   Collection Time: 04/01/22  4:20 PM  Result Value Ref Range   Prothrombin Time 21.0 (H) 11.4 - 15.2 seconds   INR 1.8 (H) 0.8 - 1.2    Comment: (NOTE) INR goal varies based on device and disease states. Performed at [facility], [place] [address], [place], [place] [address]   APTT     Status: Abnormal   Collection Time: 04/01/22  4:20 PM  Result Value Ref Range   aPTT 57 (H) 24 - 36 seconds    Comment:        IF BASELINE aPTT IS ELEVATED, SUGGEST PATIENT RISK ASSESSMENT BE USED TO DETERMINE APPROPRIATE ANTICOAGULANT THERAPY. Performed at [facility], [place] [address] [place], [place] [address]   CBC     Status: Abnormal   Collection Time: 04/01/22  4:20 PM  Result Value Ref Range   WBC 5.2 4.0 - 10.5 K/uL   RBC 3.49 (L) 4.22 - 5.81 MIL/uL   Hemoglobin 11.0 (L) 13.0 - 17.0 g/dL   HCT 35.6 (L) 39.0 - 52.0 %   MCV 102.0 (H) 80.0 - 100.0 fL   MCH 31.5 26.0 - 34.0 pg   MCHC 30.9 30.0 - 36.0 g/dL   RDW 19.1 (H) 11.5 - 15.5 %   Platelets 168 150 - 400 K/uL   nRBC 0.0 0.0 - 0.2 %    Comment: Performed at [facility], [place] [address], [place], [place] [address]  Differential     Status: Abnormal   Collection Time: 04/01/22  4:20 PM  Result Value Ref Range   Neutrophils Relative % 93 %   Neutro Abs 4.8 1.7 - 7.7 K/uL   Lymphocytes Relative 5 %   Lymphs Abs 0.3 (L) 0.7 - 4.0 K/uL   Monocytes Relative 2 %   Monocytes Absolute 0.1 0.1 - 1.0 K/uL   Eosinophils Relative 0 %   Eosinophils Absolute 0.0 0.0 - 0.5 K/uL   Basophils Relative 0 %   Basophils  Absolute 0.0 0.0 - 0.1 K/uL   WBC Morphology WHITE COUNT CONFIRMED ON SMEAR    RBC Morphology MORPHOLOGY UNREMARKABLE    Smear Review PLATELET COUNT CONFIRMED BY SMEAR    Abs Immature Granulocytes 0.00 0.00 - 0.07 K/uL    Comment: Performed at [facility], [place] [address] [place], [place] [address]  Comprehensive metabolic panel     Status: Abnormal   Collection Time: 04/01/22  4:20 PM  Result Value Ref Range  Sodium 142 135 - 145 mmol/L   Potassium 4.7 3.5 - 5.1 mmol/L   Chloride 110 98 - 111 mmol/L   CO2 18 (L) 22 - 32 mmol/L   Glucose, Bld 106 (H) 70 - 99 mg/dL    Comment: Glucose reference range applies only to samples taken after fasting for at least 8 hours.   BUN 59 (H) 8 - 23 mg/dL   Creatinine, Ser 3.65 (H) 0.61 - 1.24 mg/dL   Calcium 9.2 8.9 - 10.3 mg/dL   Total Protein 6.4 (L) 6.5 - 8.1 g/dL   Albumin 2.3 (L) 3.5 - 5.0 g/dL   AST 44 (H) 15 - 41 U/L   ALT 35 0 - 44 U/L   Alkaline Phosphatase 80 38 - 126 U/L   Total Bilirubin 0.6 0.3 - 1.2 mg/dL   GFR, Estimated 15 (L) >60 mL/min    Comment: (NOTE) Calculated using the CKD-EPI Creatinine Equation (2021)    Anion gap 14 5 - 15    Comment: Performed at [facility], [place] [address] [place], [place] [address]  Blood culture (routine x 2)     Status: None (Preliminary result)   Collection Time: 04/01/22  4:20 PM   Specimen: BLOOD  Result Value Ref Range   Specimen Description BLOOD BLOOD LEFT ARM    Special Requests      BOTTLES DRAWN AEROBIC AND ANAEROBIC Blood Culture adequate volume   Culture      NO GROWTH < 24 HOURS Performed at [facility], [place] [address] [place], [place] [address]    Report Status PENDING   TSH     Status: Abnormal   Collection Time: 04/01/22  4:20 PM  Result Value Ref Range   TSH 176.299 (H) 0.350 - 4.500 uIU/mL    Comment: Performed by a 3rd Generation assay with a functional sensitivity of <=0.01 uIU/mL. Performed at [facility], [place] [address]  [place], [place] [address]   T4, free     Status: Abnormal   Collection Time: 04/01/22  4:20 PM  Result Value Ref Range   Free T4 <0.25 (L) 0.61 - 1.12 ng/dL    Comment: (NOTE) Biotin ingestion may interfere with free T4 tests. If the results are inconsistent with the TSH level, previous test results, or the clinical presentation, then consider biotin interference. If needed, order repeat testing after stopping biotin. Performed at [facility], [place] [address], [place], [place] [address]   Resp Panel by RT-PCR (Flu A&B, Covid) Anterior Nasal Swab     Status: None   Collection Time: 04/01/22  5:04 PM   Specimen: Anterior Nasal Swab  Result Value Ref Range   SARS Coronavirus 2 by RT PCR NEGATIVE NEGATIVE    Comment: (NOTE) SARS-CoV-2 target nucleic acids are NOT DETECTED.  The SARS-CoV-2 RNA is generally detectable in upper respiratory specimens during the acute phase of infection. The lowest concentration of SARS-CoV-2 viral copies this assay can detect is 138 copies/mL. A negative result does not preclude SARS-Cov-2 infection and should not be used as the sole basis for treatment or other patient management decisions. A negative result may occur with  improper specimen collection/handling, submission of specimen other than nasopharyngeal swab, presence of viral mutation(s) within the areas targeted by this assay, and inadequate number of viral copies(<138 copies/mL). A negative result must be combined with clinical observations, patient history, and epidemiological information. The expected result is Negative.  Fact Sheet for Patients:  EntrepreneurPulse.com.au  Fact Sheet for Healthcare Providers:  IncredibleEmployment.be  This test is no t yet approved or cleared by the [place] FDA and  has been authorized for detection and/or diagnosis of SARS-CoV-2 by FDA under an Emergency Use Authorization (EUA). This EUA will remain  in  effect (meaning this test can be used) for the duration of the COVID-19 declaration under Section 564(b)(1) of the Act, 21 U.S.C.section 360bbb-3(b)(1), unless the authorization is terminated  or revoked sooner.       Influenza A by PCR NEGATIVE NEGATIVE   Influenza B by PCR NEGATIVE NEGATIVE    Comment: (NOTE) The Xpert Xpress SARS-CoV-2/FLU/RSV plus assay is intended as an aid in the diagnosis of influenza from Nasopharyngeal swab specimens and should not be used as a sole basis for treatment. Nasal washings and aspirates are unacceptable for Xpert Xpress SARS-CoV-2/FLU/RSV testing.  Fact Sheet for Patients: EntrepreneurPulse.com.au  Fact Sheet for Healthcare Providers: IncredibleEmployment.be  This test is not yet approved or cleared by the [place] FDA and has been authorized for detection and/or diagnosis of SARS-CoV-2 by FDA under an Emergency Use Authorization (EUA). This EUA will remain in effect (meaning this test can be used) for the duration of the COVID-19 declaration under Section 564(b)(1) of the Act, 21 U.S.C. section 360bbb-3(b)(1), unless the authorization is terminated or revoked.  Performed at [facility], [place] [address], [place], [place] [address]   I-stat chem 8, ED (not at [facility] or [facility])     Status: Abnormal   Collection Time: 04/01/22  5:34 PM  Result Value Ref Range   Sodium 140 135 - 145 mmol/L   Potassium 3.9 3.5 - 5.1 mmol/L   Chloride 109 98 - 111 mmol/L   BUN 50 (H) 8 - 23 mg/dL   Creatinine, Ser 3.40 (H) 0.61 - 1.24 mg/dL   Glucose, Bld 187 (H) 70 - 99 mg/dL    Comment: Glucose reference range applies only to samples taken after fasting for at least 8 hours.   Calcium, Ion 1.17 1.15 - 1.40 mmol/L   TCO2 18 (L) 22 - 32 mmol/L   Hemoglobin 10.9 (L) 13.0 - 17.0 g/dL   HCT 32.0 (L) 39.0 - 52.0 %  Lactic acid, plasma     Status: None   Collection Time: 04/01/22  6:21 PM  Result Value Ref Range    Lactic Acid, Venous 1.2 0.5 - 1.9 mmol/L    Comment: Performed at [place] [address] [place], [place] [address]  Cortisol     Status: None   Collection Time: 04/01/22  6:48 PM  Result Value Ref Range   Cortisol, Plasma >100.0 ug/dL    Comment: RESULTS CONFIRMED BY MANUAL DILUTION (NOTE) AM    6.7 - 22.6 ug/dL PM   <10.0       ug/dL Performed at [place] [address] [place], [place] [address]   CBG monitoring, ED     Status: Abnormal   Collection Time: 04/01/22  7:07 PM  Result Value Ref Range   Glucose-Capillary 128 (H) 70 - 99 mg/dL    Comment: Glucose reference range applies only to samples taken after fasting for at least 8 hours.  CBG monitoring, ED     Status: Abnormal   Collection Time: 04/02/22  1:12 AM  Result Value Ref Range   Glucose-Capillary 60 (L) 70 - 99 mg/dL    Comment: Glucose reference range applies only to samples taken after fasting  for at least 8 hours.  CBG monitoring, ED     Status: Abnormal   Collection Time: 04/02/22  1:29 AM  Result Value Ref Range   Glucose-Capillary 60 (L) 70 - 99 mg/dL    Comment: Glucose reference range applies only to samples taken after fasting for at least 8 hours.  T4, free     Status: None   Collection Time: 04/02/22  3:13 AM  Result Value Ref Range   Free T4 0.62 0.61 - 1.12 ng/dL    Comment: (NOTE) Biotin ingestion may interfere with free T4 tests. If the results are inconsistent with the TSH level, previous test results, or the clinical presentation, then consider biotin interference. If needed, order repeat testing after stopping biotin. Performed at [facility], [place] [address], [place], [place] [address]   Basic metabolic panel     Status: Abnormal   Collection Time: 04/02/22  3:13 AM  Result Value Ref Range   Sodium 140 135 - 145 mmol/L   Potassium 3.7 3.5 - 5.1 mmol/L   Chloride 113 (H) 98 - 111 mmol/L   CO2 17 (L) 22 - 32 mmol/L   Glucose, Bld 98 70 - 99 mg/dL    Comment:  Glucose reference range applies only to samples taken after fasting for at least 8 hours.   BUN 56 (H) 8 - 23 mg/dL   Creatinine, Ser 3.55 (H) 0.61 - 1.24 mg/dL   Calcium 8.3 (L) 8.9 - 10.3 mg/dL   GFR, Estimated 16 (L) >60 mL/min    Comment: (NOTE) Calculated using the CKD-EPI Creatinine Equation (2021)    Anion gap 10 5 - 15    Comment: Performed at [facility] [address], [place], [place] [address]   CT Head Wo Contrast  Result Date: 04/01/2022 CLINICAL DATA:  Unresponsive and lethargic. EXAM: CT HEAD WITHOUT CONTRAST TECHNIQUE: Contiguous axial images were obtained from the base of the skull through the vertex without intravenous contrast. RADIATION DOSE REDUCTION: This exam was performed according to the departmental dose-optimization program which includes automated exposure control, adjustment of the mA and/or kV according to patient size and/or use of iterative reconstruction technique. COMPARISON:  August 18, 2019 FINDINGS: Brain: There is mild cerebral atrophy with widening of the extra-axial spaces and ventricular dilatation. There are areas of decreased attenuation within the white matter tracts of the supratentorial brain, consistent with microvascular disease changes. A small chronic right temporoparietal lobe infarct is noted. Vascular: No hyperdense vessel or unexpected calcification. Skull: Normal. Negative for fracture or focal lesion. Sinuses/Orbits: No acute finding. Other: None. IMPRESSION: 1. No acute intracranial abnormality. 2. Small chronic right temporoparietal lobe infarct. 3. Generalized cerebral atrophy and microvascular disease changes of the supratentorial brain. Electronically Signed   By: Virgina Norfolk M.D.   On: 04/01/2022 19:34   DG Chest Portable 1 View  Result Date: 04/01/2022 CLINICAL DATA:  Unresponsive, lethargic EXAM: PORTABLE CHEST 1 VIEW COMPARISON:  01/07/2022, 01/04/2022 FINDINGS: Single frontal view of the chest demonstrates stable  right chest wall port. Cardiac silhouette is unremarkable. No airspace disease, effusion, or pneumothorax. Stable birdshot left chest wall. No acute bony abnormalities. IMPRESSION: 1. No acute intrathoracic process. Electronically Signed   By: Randa Ngo M.D.   On: 04/01/2022 18:08    Pending Labs Unresulted Labs (From admission, onward)     Start     Ordered   04/02/22 0827  MRSA Next Gen by PCR, Nasal  (MRSA Screening)  Once,  R        04/02/22 0827   04/02/22 0500  T4, free  Daily at 5am,   R      04/01/22 2201   04/02/22 0500  T3  Daily at 5am,   R      04/01/22 2201   04/02/22 8338  Basic metabolic panel  Daily at 5am,   R      04/01/22 2201   04/01/22 1639  Blood culture (routine x 2)  BLOOD CULTURE X 2,   R      04/01/22 1639            Vitals/Pain Today's Vitals   04/02/22 0741 04/02/22 0900 04/02/22 1030 04/02/22 1142  BP:  (!) 88/74 112/88   Pulse: 71 71    Resp: '13 14 11   '$ Temp:    (!) 95.3 F (35.2 C)  TempSrc:    Temporal  SpO2: 96% 94%    Weight:      PainSc:        Isolation Precautions No active isolations  Medications Medications  sodium chloride flush (NS) 0.9 % injection 10-40 mL ( Intracatheter Not Given 04/02/22 1119)  sodium chloride flush (NS) 0.9 % injection 10-40 mL (has no administration in time range)  Chlorhexidine Gluconate Cloth 2 % PADS 6 each (6 each Topical Not Given 04/02/22 1120)  metroNIDAZOLE (FLAGYL) IVPB 500 mg (0 mg Intravenous Stopped 04/02/22 0742)  hydrocortisone sodium succinate (SOLU-CORTEF) 100 MG injection 100 mg (100 mg Intravenous Given 04/02/22 1133)  HYDROmorphone (DILAUDID) injection 0.5 mg (has no administration in time range)  dextrose 5 % in lactated ringers infusion ( Intravenous New Bag/Given 04/02/22 1145)  levothyroxine (SYNTHROID, LEVOTHROID) injection 50 mcg (50 mcg Intravenous Given 04/02/22 1134)  ceFEPIme (MAXIPIME) 2 g in sodium chloride 0.9 % 100 mL IVPB (has no administration in time range)   vancomycin variable dose per unstable renal function (pharmacist dosing) (has no administration in time range)  dextrose 50 % solution (50 mLs  Given 04/01/22 1657)  lactated ringers bolus 1,000 mL (0 mLs Intravenous Stopped 04/01/22 2019)  vancomycin (VANCOREADY) IVPB 1250 mg/250 mL (0 mg Intravenous Stopped 04/01/22 2019)  lactated ringers bolus 1,000 mL (0 mLs Intravenous Stopped 04/01/22 2106)  sodium chloride 0.9 % bolus 1,000 mL (0 mLs Intravenous Stopped 04/01/22 2019)  hydrocortisone sodium succinate (SOLU-CORTEF) 100 MG injection 100 mg (100 mg Intravenous Given 04/01/22 1822)  levothyroxine (SYNTHROID, LEVOTHROID) injection 200 mcg (200 mcg Intravenous Given 04/01/22 1943)  lactated ringers bolus 1,000 mL (0 mLs Intravenous Stopped 04/02/22 0251)    Mobility non-ambulatory High fall risk   Focused Assessments Neuro Assessment Handoff:  Swallow screen pass? No    NIH Stroke Scale ( + Modified Stroke Scale Criteria)  Interval: Initial Level of Consciousness (1a.)   : Not alert, requires repeated stimulation to attend, or is obtunded and requires strong or painful stimulation to make movements (not stereotyped) LOC Questions (1b. )   +: Answers neither question correctly LOC Commands (1c. )   + : Performs neither task correctly Best Gaze (2. )  +: Normal Visual (3. )  +: No visual loss Facial Palsy (4. )    : Minor paralysis (L sided facial droop) Motor Arm, Left (5a. )   +: No effort against gravity Motor Arm, Right (5b. )   +: No effort against gravity Motor Leg, Left (6a. )   +: No effort against gravity Motor Leg, Right (6b. )   +: No  effort against gravity Limb Ataxia (7. ): Absent Sensory (8. )   +: Normal, no sensory loss Best Language (9. )   +: Severe aphasia Dysarthria (10. ): Severe dysarthria, patient's speech is so slurred as to be unintelligible in the absence of or out of proportion to any dysphasia, or is mute/anarthric Extinction/Inattention (11.)   +: No  Abnormality Modified SS Total  +: 18 Complete NIHSS TOTAL: 23     Neuro Assessment: Exceptions to WDL Neuro Checks:   Initial (04/01/22 1638)  Last Documented NIHSS Modified Score: 18 (04/01/22 1638) Has TPA been given? No If patient is a Neuro Trauma and patient is going to OR before floor call report to [place] nurse: [phone] or [phone]   R Recommendations: See Admitting Provider Note  Report given to:   Additional Notes: DNR from [place], daughter [facility]) will not be here until 10/8 but wants to continue supportive medical care but does not want pressors, CPR, intubation/vent. Pt is on the bairhugger for temp management.

## 2022-04-02 NOTE — Consult Note (Signed)
Consultation Note Date: 04/02/2022   Patient Name: Dillon Smith  DOB: 07/24/1928  MRN: [id number]  Age / Sex: 88 y.o., male  PCP: [name] Referring Physician: Leeanne Rio, MD  Reason for Consultation: Establishing goals of care  HPI/Patient Profile: 88 y.o. male  with past medical history of CAD, CKD3, hypertension, persistent afib, prostate cancer, and gastric cancer admitted on 04/01/2022 with AMS, hypothermia, bradycardia.   Patient treated for sepsis and work-up then revealed myxedema coma, AKI.  PMT has been consulted to assist with goals of care conversation.  Clinical Assessment and Goals of Care:  I have reviewed medical records including EPIC notes, labs and imaging, received report from RN, assessed the patient and then had a phone conversation with patient's daughter Dillon Smith to discuss diagnosis prognosis, Mina, EOL wishes, disposition and options.  I introduced Palliative Medicine as specialized medical care for people living with serious illness. It focuses on providing relief from the symptoms and stress of a serious illness. The goal is to improve quality of life for both the patient and the family.  I attempted to elicit values and goals of care important to the patient.    Medical History Review and Understanding:  Discussed patient's current acute illness in the context of his chronic comorbidities such as gastric cancer and overall poor prognosis.  Patient's daughter states she understands the severity of patient's illness and that "he always pulls through, although I am not sure if he will this time."  Advance Directives: A detailed discussion regarding advanced directives was had.  Per previous notes and discussions with PMT, patient's 2 daughters are HCPOA's.  Not currently on file.  Code Status: Concepts specific to code status, artifical feeding and hydration, and  rehospitalization were considered and discussed.  DNR confirmed.  Discussion: I had a phone conversation with patient's daughter Dillon Smith who tells me that patient's brother and niece are visiting today and she has made arrangements to come from the [place] area tomorrow.  She anticipates arriving around 2 PM.  She is doing considering the difficulty of today's situation.  She tells me she has a good understanding of the updates provided by patient's medical team.  It is important to her that everyone is aware the patient is hard of hearing and he may have a blank stare because he cannot hear without a special device she uses with him.  She states that she has been through this before with him, while also acknowledging this may be the final insult that causes him to succumb to his several comorbidities. Counseled on the process of initiating pressors, dose changes, required IV lines and possibly central line.  She confirms that she wants to continue with current interventions but no further escalation, no pressors or ICU. Dillon Smith states she would be at peace with patient dying prior to her arrival as long as he is not in pain and does not suffer.  She does not want him to have any new IVs placed.  She hopes that he will survive until she arrives and is open to further goals of care conversations at that time.   The difference between aggressive medical intervention and comfort care was considered in light of the patient's goals of care.   Discussed the importance of continued conversation with family and the medical providers regarding overall plan of care and treatment options, ensuring decisions are within the context of the patient's values and GOCs.   The family was encouraged to call with  questions or concerns.  PMT will continue to support holistically.   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  Constitutional:      Appearance: He is ill-appearing.  Cardiovascular:     Rate and Rhythm: Bradycardia present.  Pulmonary:     Effort: Pulmonary effort is normal.  Skin:    General: Skin is cool and dry.  Neurological:     Mental Status: He is lethargic.    Vital Signs: BP 112/88   Pulse (!) 58   Temp (!) 96.4 F (35.8 C) (Temporal)   Resp 11   Wt 80.4 kg Comment: July 2023  SpO2 99%   BMI 24.72 kg/m  Pain Scale: 0-10   Pain Score: Asleep   SpO2: SpO2: 99 % O2 Device:SpO2: 99 % O2 Flow Rate: .O2 Flow Rate (L/min): 4 L/min   Palliative Assessment/Data: 10%    MDM: High   [place] Sloop Johnnette Litter, PA-C  Palliative Medicine Team Team phone # [phone]  Thank you for allowing the Palliative Medicine Team to assist in the care of this patient. Please utilize secure chat with additional questions, if there is no response within 30 minutes please call the above phone number.  Palliative Medicine Team providers are available by phone from 7am to 7pm daily and can be reached through the team cell phone.  Should this patient require assistance outside of these hours, please call the patient's attending physician.

## 2022-04-02 NOTE — Progress Notes (Addendum)
FMTS Interim Progress Note  Reached out to patient's daughter Reine Just via phone call to provide an update.  Ms. Rosita Fire states that she is planning to arrive here 10/4 around 2 PM.  Also attempted to clarify [place] with regards to blood pressure management.  Ms. Rosita Fire states that the patient's wishes are to "avoid anything artificial" such as mechanical ventilation but there is a gray area in her understanding specifically with blood pressure management.  Ms. Rosita Fire states that she is okay with giving the patient IV medication for blood pressure.  She understands the severity and prognosis of the patient's condition and is hopeful to see him tomorrow.  She asked about pain control and I informed that we have IV Dilaudid available for the patient.  Edit 2:13PM: On further discussion per palliative, Ms. Rosita Fire states that she does not wish for additional IV's for pressors, nor does she want escalation to ICU.   August Albino, MD 04/02/2022, 1:40 PM PGY-1, [place] Medicine Service pager [phone]

## 2022-04-02 NOTE — ED Notes (Signed)
Pt hypoglycemia (60) and hypotension (81/47) discussed with MD. Per patient requests for limited life saving intervention, only maintenance fluid will be ordered at this time.

## 2022-04-02 NOTE — ED Notes (Signed)
RN aware of pts CBG.

## 2022-04-02 NOTE — Progress Notes (Signed)
Called patients daughter to give her an update on patients vitals and recent labs including low blood sugar. She was amenable to both maintenance fluids and boluses as needed. Still does not want escalation of care or pressors. She would like IV pain medication to be given if patient is perceived to be in pain and she understands that the medication may lower his blood pressure further and could suppress breathing. She is still working on changing her flight from 10/8 to an earlier time. She is available to be reached by phone if needed.

## 2022-04-02 NOTE — ED Notes (Signed)
Per MD Nita Sells, after speaking to daughter, patient is consulted to palliative care d/t DNR status. MD states we can administer PRN D50 per MAR and orders. No pressors are indicated at this time per family requests. Patient will continue with comfort care.

## 2022-04-02 NOTE — ED Notes (Signed)
DNR armband applied to patient's R arm

## 2022-04-02 NOTE — Progress Notes (Signed)
Daily Progress Note Intern Pager: [phone]  Patient name: Dillon Smith Medical record number: [id number] Date of birth: 08/20/1928 Age: 88 y.o. Gender: male  Primary Care Provider: [facility] Consultants: None Code Status: DNR  Pt Overview and Major Events to Date:  10/2-admitted  Assessment and Plan:  Dillon Smith is a 88 y.o. male presenting with AMS, shock likely in the setting of myxedema coma given significantly elevated TSH and low T4.  DDx also includes sepsis, pneumonia, UTI, amiodarone side effect. PMHx includes CKD, CAD s/p CABG in 2007, HLD, HTN, A-fib, prostate cancer, CVA.   * 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HCC) Markedly elevated TSH in setting of multiple organ dysfunction c/w myxedema coma.  Also with hypotension, bradycardia, hypothermia.  Suspect amiodarone use (initiated in 12/2021) vs infection as etiology. Pulmonary infection and stroke less likely given imaging findings. Blood or urine infections could provide source for precipitation of condition. While awaiting results of Bcx and Ucx, will continue levothyroxine, hydrocortisone, and abx for treatment of current condition. Patient's POA contacted and agrees to plan of care for above interventions, though she would like patient to remain DNR. Daughter lives out of state and is planning to come see patient. Anticipate that he is likely nearing end of life; mortality for myxedema coma is 40%, discussed overall poor prognosis.  -76mg levothyroxine IV daily  -T4 and T3 daily to assess levo response and prevent T3 overload -100 mg hydrocortisone Q8 for 48 hours -Cefepime 2g IV Q12 (10/2-) -Flagyl '500mg'$  IV Q12 (10/2-) -Vancomycin '1000mg'$  IV daily (10/2-) - may dc after MRSA swab -IVF with D5LR at 150 mL/hr ; s/p 2L LR boluses. Avoid additional boluses given concern for overload and decreased EF. May remove dextrose if hyperglycemic with concurrent steroids -F/u Bcx, Ucx -Cardiac monitoring, vitals, O2 -Continue  bair hugger -IV Dilaudid 0.5 mg q2h PRN moderate or severe pain -NPO -Daughter confirmed would like to avoid aggressive measures such as pressors, escalation to ICU. Would like to treat with measures above and keep patient comfortable. Will continue to update her -Palliative consulted  Shock ([place] Unstable. Bradycardic, hypothermic, tachypneic requiring 4L [place], MAP 61. 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 Management also as above. Daughter/POA declined use of pressors should shock worsen in lieu of current interventions.  AKI (acute kidney injury) ([place] Cr 3.65 on admission, baseline around 1.2-1.4. Suspect myxedema and resulting shock/systemic organ dysfunction as etiology. Continue management as above.  Atrial fibrillation ([place] Admission EKG with bradycardia. Holding eliquis in setting of incomplete med rec and overall GOC this admission.  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[place] [employer] Not a candidate for chemo or radiation. Referred for hospice in 01/2022. DNR this admission.       FEN/GI: N.p.o., D5LR mIVF PPx: NA Dispo: Pending GOC, palliative discussion  Subjective:  Patient unable to verbalize today.  Making gurgling sound and grunting.  Squeezes hands.  Objective: Temp:  [89 F (31.7 C)-97.8 F (36.6 C)] 95.3 F (35.2 C) (10/03 1142) Pulse Rate:  [29-77] 71 (10/03 0900) Resp:  [8-24] 11 (10/03 1030) BP: (67-187)/(39-159) 112/88 (10/03 1030) SpO2:  [94 %-100 %] 94 % (10/03 0900) Weight:  [80.4 kg] 80.4 kg (10/03 0700) Physical Exam: General: In bed, making grunting sounds, right upper extremity shaking Cardiovascular: Difficult to appreciate heart sounds Respiratory: Difficult to appreciate breath sounds Abdomen: Soft, nondistended Extremities: Squeezes fingers Neuro: Minimally responsive, unable to verbalize, responds to painful stimuli  Laboratory: Most recent CBC Lab Results  Component Value Date   WBC  5.2 04/01/2022   HGB 10.9 (L) 04/01/2022   HCT 32.0 (L) 04/01/2022    MCV 102.0 (H) 04/01/2022   PLT 168 04/01/2022   Most recent BMP    Latest Ref Rng & Units 04/02/2022    3:13 AM  BMP  Glucose 70 - 99 mg/dL 98   BUN 8 - 23 mg/dL 56   Creatinine 0.61 - 1.24 mg/dL 3.55   Sodium 135 - 145 mmol/L 140   Potassium 3.5 - 5.1 mmol/L 3.7   Chloride 98 - 111 mmol/L 113   CO2 22 - 32 mmol/L 17   Calcium 8.9 - 10.3 mg/dL 8.3     FT4 0.62   Dillon Albino, MD 04/02/2022, 12:07 PM  PGY-1, [place] Intern pager: [phone], text pages welcome Secure chat group [place]

## 2022-04-03 DIAGNOSIS — Z789 Other specified health status: Secondary | ICD-10-CM

## 2022-04-03 DIAGNOSIS — E035 Myxedema coma: Secondary | ICD-10-CM | POA: Diagnosis not present

## 2022-04-03 DIAGNOSIS — N179 Acute kidney failure, unspecified: Secondary | ICD-10-CM

## 2022-04-03 DIAGNOSIS — Z515 Encounter for palliative care: Secondary | ICD-10-CM

## 2022-04-03 DIAGNOSIS — Z66 Do not resuscitate: Secondary | ICD-10-CM

## 2022-04-03 DIAGNOSIS — C163 Malignant neoplasm of pyloric antrum: Secondary | ICD-10-CM

## 2022-04-03 DIAGNOSIS — R4182 Altered mental status, unspecified: Secondary | ICD-10-CM

## 2022-04-03 DIAGNOSIS — R579 Shock, unspecified: Secondary | ICD-10-CM

## 2022-04-03 DIAGNOSIS — R451 Restlessness and agitation: Secondary | ICD-10-CM

## 2022-04-03 DIAGNOSIS — Z7189 Other specified counseling: Secondary | ICD-10-CM

## 2022-04-03 LAB — T4, FREE: Free T4: 0.55 ng/dL — ABNORMAL LOW (ref 0.61–1.12)

## 2022-04-03 LAB — BASIC METABOLIC PANEL
Anion gap: 13 (ref 5–15)
BUN: 67 mg/dL — ABNORMAL HIGH (ref 8–23)
CO2: 19 mmol/L — ABNORMAL LOW (ref 22–32)
Calcium: 8.6 mg/dL — ABNORMAL LOW (ref 8.9–10.3)
Chloride: 113 mmol/L — ABNORMAL HIGH (ref 98–111)
Creatinine, Ser: 4.33 mg/dL — ABNORMAL HIGH (ref 0.61–1.24)
GFR, Estimated: 12 mL/min — ABNORMAL LOW (ref >60)
Glucose, Bld: 116 mg/dL — ABNORMAL HIGH (ref 70–99)
Potassium: 4.3 mmol/L (ref 3.5–5.1)
Sodium: 145 mmol/L (ref 135–145)

## 2022-04-03 MED ORDER — GLYCOPYRROLATE 0.2 MG/ML IJ SOLN: 0.2000 mg | INTRAMUSCULAR | Status: DC | PRN

## 2022-04-03 MED ORDER — DIPHENHYDRAMINE HCL 50 MG/ML IJ SOLN: 12.5000 mg | INTRAMUSCULAR | Status: DC | PRN

## 2022-04-03 MED ORDER — DEXTROSE IN LACTATED RINGERS 5 % IV SOLN: INTRAVENOUS | Status: DC

## 2022-04-03 MED ORDER — SODIUM CHLORIDE 0.9 % IV SOLN
1.0000 g | INTRAVENOUS | Status: DC
  Filled 2022-04-03: qty 10

## 2022-04-03 MED ORDER — LORAZEPAM 2 MG/ML PO CONC: 1.0000 mg | ORAL | Status: DC | PRN

## 2022-04-03 MED ORDER — BIOTENE DRY MOUTH MT LIQD
15.0000 mL | Freq: Two times a day (BID) | OROMUCOSAL | Status: DC
  Administered 2022-04-03 – 2022-04-05 (×4): 15 mL via TOPICAL

## 2022-04-03 MED ORDER — LORAZEPAM 1 MG PO TABS: 1.0000 mg | ORAL_TABLET | ORAL | Status: DC | PRN

## 2022-04-03 MED ORDER — GLYCOPYRROLATE 0.2 MG/ML IJ SOLN
0.2000 mg | INTRAMUSCULAR | Status: DC | PRN
  Administered 2022-04-04: 0.2 mg via INTRAVENOUS
  Filled 2022-04-03: qty 1

## 2022-04-03 MED ORDER — HALOPERIDOL LACTATE 2 MG/ML PO CONC: 2.0000 mg | Freq: Four times a day (QID) | ORAL | Status: DC | PRN

## 2022-04-03 MED ORDER — HALOPERIDOL LACTATE 5 MG/ML IJ SOLN: 2.0000 mg | Freq: Four times a day (QID) | INTRAMUSCULAR | Status: DC | PRN

## 2022-04-03 MED ORDER — ACETAMINOPHEN 325 MG PO TABS: 650.0000 mg | ORAL_TABLET | Freq: Four times a day (QID) | ORAL | Status: DC | PRN

## 2022-04-03 MED ORDER — ACETAMINOPHEN 650 MG RE SUPP: 650.0000 mg | Freq: Four times a day (QID) | RECTAL | Status: DC | PRN

## 2022-04-03 MED ORDER — HALOPERIDOL 1 MG PO TABS: 2.0000 mg | ORAL_TABLET | Freq: Four times a day (QID) | ORAL | Status: DC | PRN

## 2022-04-03 MED ORDER — GLYCOPYRROLATE 1 MG PO TABS: 1.0000 mg | ORAL_TABLET | ORAL | Status: DC | PRN

## 2022-04-03 MED ORDER — HYDROMORPHONE HCL 1 MG/ML IJ SOLN: 0.5000 mg | INTRAMUSCULAR | Status: DC | PRN

## 2022-04-03 MED ORDER — ONDANSETRON HCL 4 MG/2ML IJ SOLN: 4.0000 mg | Freq: Four times a day (QID) | INTRAMUSCULAR | Status: DC | PRN

## 2022-04-03 MED ORDER — ONDANSETRON 4 MG PO TBDP: 4.0000 mg | ORAL_TABLET | Freq: Four times a day (QID) | ORAL | Status: DC | PRN

## 2022-04-03 MED ORDER — POLYVINYL ALCOHOL 1.4 % OP SOLN: 1.0000 [drp] | Freq: Four times a day (QID) | OPHTHALMIC | Status: DC | PRN

## 2022-04-03 MED ORDER — LORAZEPAM 2 MG/ML IJ SOLN: 1.0000 mg | INTRAMUSCULAR | Status: DC | PRN

## 2022-04-03 NOTE — TOC Initial Note (Addendum)
Transition of Care [facility]) - Initial/Assessment Note    Patient Details  Name: Dillon Smith MRN: [id number] Date of Birth: 1929/06/14  Transition of Care [facility]) CM/SW Contact:    Bethann Berkshire, [place] Phone Number: 04/03/2022, 4:20 PM  Clinical Narrative:                  CSW is informed by PMT that pt's family want to move forward with hospice facility and [facility]   CSW met with pt's daughter Dillon Smith bedside. She confirmed preference for [facility]. Dillon Smith arrived in town from [place] today. She states that pt was previously at [facility] though she reports after a few days there he ended up improving and transferring to [facility] for STR. She states pt was at [facility] for 1 or 2 weeks prior to this admission. CSW explained Hospice referral/transition process. [facility] has received referral and will review for admission to [facility].   CSW updated [employer].   Expected Discharge Plan: [place] Barriers to Discharge:  ([place] reviewing refferral/ waiting on bed)   Patient Goals and CMS Choice        Expected Discharge Plan and Services Expected Discharge Plan: [place] Choice: Residential Hospice Bed Living arrangements for the past 2 months: Dillon Smith                                      Prior Living Arrangements/Services Living arrangements for the past 2 months: Dillon Smith Lives with:: Facility Resident Patient language and need for interpreter reviewed:: Yes        Need for Family Participation in Patient Care: Yes (Comment) Care giver support system in place?: Yes (comment)   Criminal Activity/Legal Involvement Pertinent to Current Situation/Hospitalization: No - Comment as needed  Activities of Daily Living      Permission Sought/Granted                  Emotional Assessment Appearance:: Appears stated  age Attitude/Demeanor/Rapport: Sedated Affect (typically observed): Other (comment) (sedated)   Alcohol / Substance Use: Not Applicable Psych Involvement: No (comment)  Admission diagnosis:  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HCC) [E03.5] AKI (acute kidney injury) ([place]) [N17.9] Hypothermia, initial encounter [T68.XXXA] Altered mental status, unspecified altered mental status type [R41.82] Patient Active Problem List   Diagnosis Date Noted   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 08/19/2019   Symptomatic bradycardia 08/19/2019   Bradycardia 08/18/2019   COVID-19 virus infection 08/17/2019   Atrial fibrillation ([place]) 08/17/2019   Hyperlipidemia 08/17/2019   Stage 3a chronic kidney disease (CKD) ([place]) 08/17/2019   Acute ischemic stroke (HCC) mult R MCA and PCA embolic infarcts d/t AF not on AC s/p tPA 08/15/2019   CARCINOMA, PROSTATE 09/15/2007   Essential hypertension 09/15/2007   Coronary artery disease 09/15/2007   HEMORRHOIDS, INTERNAL 09/15/2007   RADIATION PROCTITIS 09/15/2007   PCP:  [facility] [place]:   [place] [address] [place] [address]  Phone: [phone] Fax: [phone]  [id number] [place], [place]Phone: [phone]: [phone] [place]Phone: [phone]: [phone]    Social Determinants of Health (SDOH) Interventions    Readmission Risk Interventions     No data to display

## 2022-04-03 NOTE — Progress Notes (Signed)
Daily Progress Note Intern Pager: [phone]  Patient name: Dillon Smith Medical record number: [id number] Date of birth: 03/13/1929 Age: 88 y.o. Gender: male  [facility] Care Provider: [name] Consultants: Palliative Code Status: DNR  Pt Overview and Major Events to Date:  10/2-admitted  Assessment and Plan:  Dillon Smith is a 88 y.o. male presenting with AMS, shock likely in the setting of myxedema coma given significantly elevated TSH and low T4.  Primarily comfort care.   DDx for etiology includes sepsis, pneumonia, UTI, amiodarone side effect. PMHx includes CKD, CAD s/p CABG in 2007, HLD, HTN, A-fib, prostate cancer, CVA.  * 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HCC) Continuing antibiotics, levothyroxine, steroids, fluids.  Continue current management, will not escalate level of care per palliative.  Anticipate that he is likely nearing end of life; mortality for myxedema coma is 40%, discussed overall poor prognosis.  Daughter to arrive today -39mg levothyroxine IV daily  -Hold off on T3 for now.  Pharmacist discussed with ICU -100 mg hydrocortisone Q8 for 48 hours -Cefepime 2g IV Q12 (10/2-) -Flagyl '500mg'$  IV Q12 (10/2-) -Discontinue vancomycin given elevated creatinine -IVF with D5LR at 75 mL/hr ; s/p 2L LR boluses. Avoid additional boluses given concern for overload and decreased EF. May remove dextrose if hyperglycemic with concurrent steroids -F/u Bcx, Ucx -F/u BMP, daily T4 -Cardiac monitoring, vitals, O2 -Continue bair hugger -IV Dilaudid 0.5 mg q2h PRN moderate or severe pain -NPO -Daughter confirmed would like to avoid aggressive measures such as pressors, escalation to ICU. Would like to treat with measures above and keep patient comfortable. Will continue to update her -Palliative following  Shock ([place] Unstable. Bradycardic, hypothermic, tachypneic requiring 4L [place], MAP 61. 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 Management also as above. Daughter/POA declined use  of pressors should shock worsen in lieu of current interventions.  AKI (acute kidney injury) ([place] Cr 3.65 on admission, baseline around 1.2-1.4. Suspect myxedema and resulting shock/systemic organ dysfunction as etiology. Continue management as above.  Atrial fibrillation ([place] Admission EKG with bradycardia. Holding eliquis in setting of incomplete med rec and overall GOC this admission.  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[place] Not a candidate for chemo or radiation. Referred for hospice in 01/2022. DNR this admission.       FEN/GI: N.p.o. PPx: SCDs Dispo: Pending GOC    Subjective:  Patient evaluated this morning.  Not verbal, no change from prior.  Objective: Temp:  [94.8 F (34.9 C)-98.3 F (36.8 C)] 97.6 F (36.4 C) (10/04 1116) Pulse Rate:  [55-126] 77 (10/04 1116) Resp:  [11-22] 14 (10/04 1116) BP: (61-126)/(43-104) 108/63 (10/04 1116) SpO2:  [96 %-100 %] 100 % (10/04 1116) Weight:  [77.3 kg-79.1 kg] 79.1 kg (10/04 0006) Physical Exam: General: Awake, does not track well with eyes, occasional snoring and grunting.  Nonverbal Cardiovascular: Difficult to appreciate, A-fib on monitor Respiratory: Difficult to appreciate breath sounds Abdomen: Soft, nondistended Extremities: No lower extremity edema  Laboratory: Most recent CBC Lab Results  Component Value Date   WBC 5.2 04/01/2022   HGB 10.9 (L) 04/01/2022   HCT 32.0 (L) 04/01/2022   MCV 102.0 (H) 04/01/2022   PLT 168 04/01/2022   Most recent BMP    Latest Ref Rng & Units 04/02/2022    3:13 AM  BMP  Glucose 70 - 99 mg/dL 98   BUN 8 - 23 mg/dL 56   Creatinine 0.61 - 1.24 mg/dL 3.55   Sodium 135 - 145 mmol/L 140   Potassium 3.5 -  5.1 mmol/L 3.7   Chloride 98 - 111 mmol/L 113   CO2 22 - 32 mmol/L 17   Calcium 8.9 - 10.3 mg/dL 8.3     MRSA swab negative Blood culture staph epi, possible contaminant   August Albino, MD 04/03/2022, 12:20 PM  PGY-1, [place] Intern pager: [phone],  text pages welcome Secure chat group [place]

## 2022-04-03 NOTE — Progress Notes (Signed)
Manufacturing engineer [facility]) Hospital Liaison Note  Referral received for patient/family interest in [facility]. Chart under review by [facility] physician.   Hospice eligibility pending.   Bedside assessment and evaluation will be completed tomorrow morning.   Please call with any questions or concerns. Thank you  Roselee Nova, [place] Hospital Liaison [phone]

## 2022-04-03 NOTE — Progress Notes (Signed)
Pharmacy Antibiotic Note  Dillon Smith is a 88 y.o. male admitted on 04/01/2022 with sepsis.  Pharmacy has been consulted fo cefepime dosing. Metronidazole per team.   Chest X-ray unremarkable. WBC 5.2, Scr 3.4>>4.33 since admission, afebrile. Blood cx growing 1/2 staph epi--likely a contaminant. Patient will likely transition to comfort care soon. Will decrease cefepime dose given worsening renal function.   Plan: Decrease Cefepime to 1g q24hr  Continue Metronidazole '500mg'$  q12h  Monitor cultures, clinical status, renal function, vancomycin level Narrow abx as able and f/u duration  Height: 6' (182.9 cm) Weight: 79.1 kg (174 lb 6.1 oz) IBW/kg (Calculated) : 77.6  Temp (24hrs), Avg:97.7 F (36.5 C), Min:97.6 F (36.4 C), Max:98.3 F (36.8 C)  Recent Labs  Lab 04/01/22 1620 04/01/22 1734 04/01/22 1821 04/02/22 0313 04/03/22 1252  WBC 5.2  --   --   --   --   CREATININE 3.65* 3.40*  --  3.55* 4.33*  LATICACIDVEN  --   --  1.2  --   --      Estimated Creatinine Clearance: 12.2 mL/min (A) (by C-G formula based on SCr of 4.33 mg/dL (H)).    Allergies  Allergen Reactions   Penicillins Itching    Antimicrobials this admission: Vanc 1250 x1 on 10/2 Cefe 10/2 >>  MTZ  10/2 >>   Dose adjustments this admission: N/a  Microbiology results: 10/2 BCx: 1/2 staph epi  10/4  UCx: pending  Thank you for allowing pharmacy to be a part of this patient's care.   Billey Gosling, PharmD PGY1 Pharmacy Resident 10/4/20233:14 PM

## 2022-04-03 NOTE — Progress Notes (Signed)
Daily Progress Note   Patient Name: Dillon Smith       Date: 04/03/2022 DOB: 1929/04/26  Age: 88 y.o. MRN#: [id number] Attending Physician: Lenoria Chime, MD Primary Care Physician: [name] Admit Date: 04/01/2022  Reason for Consultation/Follow-up: Establishing goals of care  Subjective: Chart review performed. Received report from primary RN - no acute concerns. RN reports patient remains lethargic, not swallowing, not following commands. Per RN, patient's daughter would like to speak with attending on arrival.  Went to visit patient at bedside - no family/visitors present. Patient was lying in bed awake, unresponsive to questions, he does not track my presence. Signs/non-verbal gestures discomfort noted to include groaning and restlessness - RN to administer dilaudid. No respiratory distress, increased work of breathing, or secretions noted. He is on 4L O2 [place].  Coordinated [place] meeting with attending team pending daughters arrival time. RN to notify team upon her arrival.  2:36 PM RN provided notification daughter was present at bedside.   3:10 PM Met daughter/HCPOA/Dillon Smith at bedside along with Dr. Thompson Grayer. Patient was lying in bed awake, restless. Dr. Thompson Grayer provided medical updates and interval history since patient's admission per [facility] request.   We discussed patient's current illness and what it means in the larger context of patient's on-going co-morbidities.  Natural disease trajectory and expectations at EOL were discussed. I attempted to elicit values and goals of care important to the patient. The difference between aggressive medical intervention and comfort care was considered in light of the patient's goals of care.   Therapeutic listening provided as Dillon Smith reflects  on previous conversations had with patient about EOL wishes. She reports he had a Living Will which outlined he would not want his life prolonged in his current condition. He had expressed the desire for a natural death. Patient was previously on hospice services, but discharged. Dillon Smith is familiar with the philosophy of hospice/comfort care as she "has just been through this with my husband." Dillon Smith tells me patient has had a notable decline since March of this year.   We talked about transition to comfort measures in house and what that would entail inclusive of medications to control pain, dyspnea, agitation, nausea, and itching. We discussed stopping all unnecessary measures such as blood draws, needle sticks, oxygen, antibiotics, CBGs/insulin, cardiac monitoring, IVF, and frequent vital  signs. Education provided that other non-pharmacological interventions would be utilized for holistic support and comfort such as spiritual support if requested, repositioning, and/or therapeutic listening. She is agreeable for his transition to full comfort measures today. She tells [name] "this is what he would want."  Discussed option of residential hospice transfer - she is most interested in a facility close to the hospital and is agreeable to [facility] due to proximity.   Chaplain support offered - Dillon Smith would appreciate chaplain visit for patient, requesting prayer. Patient is of [employer].   Dillon Smith expresses appreciation for the wonderful communication offered by the medical team as she has been navigating patient's situation from [place].  Visit also consisted of discussions dealing with the complex and emotionally intense issues of symptom management and palliative care in the setting of serious and life-threatening illness.   Discussed with Dillon Smith the importance of continued conversation with each other and the medical providers regarding overall plan of care and treatment options, ensuring decisions are within the  context of the patient's values and GOCs.    Questions and concerns were addressed. The family was encouraged to call with questions and/or concerns. PMT card was provided.  Discussed case with PMT chaplain.    Length of Stay: 1  Current Medications: Scheduled Meds:   Chlorhexidine Gluconate Cloth  6 each Topical Daily   hydrocortisone sodium succinate  100 mg Intravenous Q8H   levothyroxine  50 mcg Intravenous Daily   sodium chloride flush  10-40 mL Intracatheter Q12H    Continuous Infusions:  ceFEPime (MAXIPIME) IV 2 g (04/03/22 0436)   dextrose 5% lactated ringers 75 mL/hr at 04/03/22 1141   metronidazole 500 mg (04/03/22 0438)    PRN Meds: HYDROmorphone (DILAUDID) injection, sodium chloride flush  Physical Exam Vitals and nursing note reviewed.  Constitutional:      General: He is not in acute distress. Pulmonary:     Effort: No respiratory distress.  Skin:    General: Skin is warm and dry.  Neurological:     Mental Status: He is lethargic.     Motor: Weakness present.  Psychiatric:        Speech: He is noncommunicative.        Judgment: Judgment is impulsive.             Vital Signs: BP 108/63   Pulse 77   Temp 97.6 F (36.4 C) (Axillary)   Resp 14   Ht 6' (1.829 m)   Wt 79.1 kg   SpO2 100%   BMI 23.65 kg/m  SpO2: SpO2: 100 % O2 Device: O2 Device: Nasal Cannula O2 Flow Rate: O2 Flow Rate (L/min): 4 L/min  Intake/output summary:  Intake/Output Summary (Last 24 hours) at 04/03/2022 1311 Last data filed at 04/03/2022 1116 Gross per 24 hour  Intake --  Output 325 ml  Net -325 ml   LBM:   Baseline Weight: Weight: 80.4 kg (July 2023) Most recent weight: Weight: 79.1 kg       Palliative Assessment/Data: PPS 10%      Patient Active Problem List   Diagnosis Date Noted   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   AKI (acute kidney injury) ([place]) 04/01/2022   Shock ([place]) 04/01/2022   Acute on chronic systolic heart failure ([place]) 01/08/2022   Gastric  perforation ([place]) 12/31/2021   Port-A-Cath in place 12/13/2021   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 08/19/2019   Symptomatic bradycardia 08/19/2019  Bradycardia 08/18/2019   COVID-19 virus infection 08/17/2019   Atrial fibrillation ([place]) 08/17/2019   Hyperlipidemia 08/17/2019   Stage 3a chronic kidney disease (CKD) ([place]) 08/17/2019   Acute ischemic stroke (HCC) mult R MCA and PCA embolic infarcts d/t AF not on AC s/p tPA 08/15/2019   CARCINOMA, PROSTATE 09/15/2007   Essential hypertension 09/15/2007   Coronary artery disease 09/15/2007   HEMORRHOIDS, INTERNAL 09/15/2007   RADIATION PROCTITIS 09/15/2007    Palliative Care Assessment & Plan   Patient Profile: 88 y.o. male  with past medical history of CAD, CKD3, hypertension, persistent afib, prostate cancer, and gastric cancer admitted on 04/01/2022 with AMS, hypothermia, bradycardia.    Patient treated for sepsis and work-up then revealed myxedema coma, AKI.  PMT has been consulted to assist with goals of care conversation.  Assessment: Principal Problem:   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HCC) Active Problems:   Atrial fibrillation (HCC)   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HCC)   AKI (acute kidney injury) ([place])   Shock ([place])   Terminal care  Recommendations/Plan: Initiated full comfort measures Continue DNR/DNI as previously documented - durable DNR form completed and placed in shadow chart. Copy was made and will be scanned into Vynca/ACP tab Transfer to [facility] pending evaluation and bed availability - TOC consult placed; TOC and hospice liaison notified Added orders for EOL symptom management and to reflect full comfort measures, as well as discontinued orders that were not focused on comfort Chaplain consulted for: prayer with patient Unrestricted visitation orders were placed per current [place] EOL visitation policy  Nursing to provide frequent assessments and  administer PRN medications as clinically necessary to ensure EOL comfort PMT will continue to follow and support holistically   Symptom Management Dilaudid PRN pain/dyspnea/increased work of breathing/RR>25 Tylenol PRN pain/fever Biotin twice daily Benadryl PRN itching Robinul PRN secretions Haldol PRN agitation/delirium Ativan PRN anxiety/seizure/sleep/distress Zofran PRN nausea/vomiting Liquifilm Tears PRN dry eye   Goals of Care and Additional Recommendations: Limitations on Scope of Treatment: Full Comfort Care  Code Status:    Code Status Orders  (From admission, onward)           Start     Ordered   04/01/22 2033  Do not attempt resuscitation (DNR)  Continuous       Question Answer Comment  In the event of cardiac or respiratory ARREST Do not call a "code blue"   In the event of cardiac or respiratory ARREST Do not perform Intubation, CPR, defibrillation or ACLS   In the event of cardiac or respiratory ARREST Use medication by any route, position, wound care, and other measures to relive pain and suffering. May use oxygen, suction and manual treatment of airway obstruction as needed for comfort.      04/01/22 2047           Code Status History     Date Active Date Inactive Code Status Order ID Comments User Context   04/01/2022 2047 04/01/2022 2047 DNR [id number]  Jacelyn Grip, MD ED   01/09/2022 1426 01/22/2022 1805 DNR [id number]  Loistine Chance, MD Inpatient   12/31/2021 2012 01/09/2022 1426 Full Code [id number]  Leighton Ruff, MD Inpatient   12/31/2021 1440 12/31/2021 2012 DNR [id number]  Garald Balding, PA-C ED   08/19/2019 0131 08/21/2019 1831 Full Code [id number]  Vianne Bulls, MD ED   08/15/2019 2100 08/17/2019 2210 Full Code [id number]  Amie Portland, MD ED       Prognosis:  < 2 weeks  Discharge  Planning: Hospice facility  Care plan was discussed with primary RN, patient's daughter/HCPOA, attending team, [facility], [place] liaison  Thank you for allowing the  Palliative Medicine Team to assist in the care of this patient.   Total Time 70 minutes Prolonged Time Billed  yes      Greater than 50%  of this time was spent counseling and coordinating care related to the above assessment and plan.  Lin Landsman, NP  Please contact Palliative Medicine Team phone at [phone] for questions and concerns.   *Portions of this note are a verbal dictation therefore any spelling and/or grammatical errors are due to the "[place] One" system interpretation.

## 2022-04-03 NOTE — Consult Note (Signed)
   [facility] Inpatient Consult   04/03/2022  Dillon Smith 08-Oct-1928 [id number]  [place] Organization [ACO] Patient: Medicare ACO REACH  Primary Care Provider:  [name], notes this is where most of his primary care is encountered per electronic medical record.  Patient screened for hospitalization with noted high risk score for unplanned readmission risk and to assess for potential [place] [facility] service needs for post hospital transition.  Review of patient's medical record reveals patient is from [facility] per MD progress notes H&P.    1300 Went by the patient room he is with a heavy snoring and noted lethargy, no family at the bedside.  Call placed to daughter listed in electronic medical record, no answer but able to leave a HIPPA compliant voice mail for more information regarding which facility patient is from. Reviewed palliative consult during this admission and notes daughter expected to arrive today from [place].  Plan:  Continue to follow progress and disposition.  For questions contact:   Natividad Brood, RN BSN [place]  [phone] business mobile phone Toll free office [phone]  *[place]  [phone] Fax number: [phone] [email or web address]

## 2022-04-03 NOTE — Progress Notes (Signed)
This chaplain responded to PMT consult for EOL spiritual care with the Pt. The chaplain understands the Pt. daughter-Jean requested the prayer.    The chaplain greeted [name] at the bedside. [name] shared her willingness to participate in storytelling with the chaplain. The chaplain understands the Pt. is of the [place] tradition and has carried [employer] love with him throughout his life.   The chaplain prayed with [name] and the Pt. at the bedside. This chaplain is available for F/U spiritual care as needed.  Chaplain Sallyanne Kuster [phone]

## 2022-04-04 DIAGNOSIS — E035 Myxedema coma: Secondary | ICD-10-CM | POA: Diagnosis not present

## 2022-04-04 DIAGNOSIS — Z515 Encounter for palliative care: Secondary | ICD-10-CM

## 2022-04-04 DIAGNOSIS — R4182 Altered mental status, unspecified: Secondary | ICD-10-CM | POA: Diagnosis not present

## 2022-04-04 DIAGNOSIS — C163 Malignant neoplasm of pyloric antrum: Secondary | ICD-10-CM | POA: Diagnosis not present

## 2022-04-04 DIAGNOSIS — N179 Acute kidney failure, unspecified: Secondary | ICD-10-CM | POA: Diagnosis not present

## 2022-04-04 DIAGNOSIS — T68XXXA Hypothermia, initial encounter: Secondary | ICD-10-CM

## 2022-04-04 LAB — CULTURE, BLOOD (ROUTINE X 2): Special Requests: ADEQUATE

## 2022-04-04 LAB — T3: T3, Total: 31 ng/dL — ABNORMAL LOW (ref 71–180)

## 2022-04-04 NOTE — Progress Notes (Signed)
     Daily Progress Note Intern Pager: [phone]  Patient name: Dillon Smith Medical record number: [id number] Date of birth: 28-Jun-1929 Age: 88 y.o. Gender: male  Primary Care Provider: [facility] Consultants: Palliative Code Status: DNR  Pt Overview and Major Events to Date:  10/2-admitted 10/4-transition to full comfort care  Assessment and Plan:  Dillon Smith is a 88 y.o. male presenting with AMS, shock likely in the setting of myxedema coma given significantly elevated TSH and low T4. Full comfort care.     * Myxedema coma [facility]) Per family discussion with the palliative team on 10/4, transitioning to full comfort care and withdrawing all medical interventions at this time.  Awaiting placement at hospice, preference for [facility]. -Comfort care per palliative, their help is much appreciated  Cancer of pyloric antrum [facility]) Not a candidate for chemo or radiation. Referred for hospice in 01/2022. DNR this admission.       FEN/GI: Regular diet PPx: N/A Dispo: Hospice   pending placement .    Subjective:  Seen at bedside this morning.  Sleeping, snoring, nonverbal.  Objective: Temp:  [97.8 F (36.6 C)] 97.8 F (36.6 C) (10/05 0821) Pulse Rate:  [61-120] 120 (10/05 0821) Resp:  [18-19] 19 (10/05 0821) BP: (90-184)/(62-147) 184/147 (10/05 0821) SpO2:  [93 %-100 %] 93 % (10/05 0821) Physical Exam: General: NAD, sleeping, snoring, nonverbal, appears comfortable Cardiovascular: No MRG Respiratory: CTAB Abdomen: Soft, NT/ND   Laboratory: Most recent CBC Lab Results  Component Value Date   WBC 5.2 04/01/2022   HGB 10.9 (L) 04/01/2022   HCT 32.0 (L) 04/01/2022   MCV 102.0 (H) 04/01/2022   PLT 168 04/01/2022   Most recent BMP    Latest Ref Rng & Units 04/03/2022   12:52 PM  BMP  Glucose 70 - 99 mg/dL 116   BUN 8 - 23 mg/dL 67   Creatinine 0.61 - 1.24 mg/dL 4.33   Sodium 135 - 145 mmol/L 145   Potassium 3.5 - 5.1 mmol/L 4.3    Chloride 98 - 111 mmol/L 113   CO2 22 - 32 mmol/L 19   Calcium 8.9 - 10.3 mg/dL 8.6     T3 31, T4 0.55   August Albino, MD 04/04/2022, 11:58 AM  PGY-1, [place] Intern pager: [phone], text pages welcome Secure chat group [place]

## 2022-04-04 NOTE — Progress Notes (Signed)
Daily Progress Note   Patient Name: Dillon Smith       Date: 04/04/2022 DOB: 05/13/1929  Age: 88 y.o. MRN#: [id number] Attending Physician: [name], MD Primary Care Physician: [name] Admit Date: 04/01/2022  Reason for Consultation/Follow-up: Non pain symptom management, Pain control, Psychosocial/spiritual support, and Terminal Care  Subjective: Chart review performed. Received report from primary RN - no acute concerns. Robinul administered x1 this AM. RN reports patient remains lethargic, not eating/drinking.  Went to visit patient at bedside - no family/visitors present. Patient was lying in bed asleep - he briefly wakes to voice/gentle touch, is not communicative, restless while awake. No respiratory distress, increased work of breathing, or secretions noted. He is on 2L O2 [place]. Lunch tray at bedside - 0% consumed.  Discussed case with TOC, [place] liaison, and medical team. Daughter has now expressed desire for patient to remain in house for EOL care after speaking with [facility] representative about cost. I appreciate TOC assistance helping patient's family navigate financial options.  Length of Stay: 2  Current Medications: Scheduled Meds:   antiseptic oral rinse  15 mL Topical BID   sodium chloride flush  10-40 mL Intracatheter Q12H    Continuous Infusions:   PRN Meds: acetaminophen **OR** acetaminophen, diphenhydrAMINE, glycopyrrolate **OR** glycopyrrolate **OR** glycopyrrolate, haloperidol **OR** haloperidol **OR** haloperidol lactate, HYDROmorphone (DILAUDID) injection, LORazepam **OR** LORazepam **OR** LORazepam, ondansetron **OR** ondansetron (ZOFRAN) IV, polyvinyl alcohol, sodium chloride flush  Physical Exam Vitals and nursing note reviewed.   Constitutional:      General: He is not in acute distress.    Appearance: He is ill-appearing.  Pulmonary:     Effort: No respiratory distress.  Skin:    General: Skin is warm and dry.  Neurological:     Mental Status: He is lethargic.     Motor: Weakness present.  Psychiatric:        Speech: He is noncommunicative.             Vital Signs: BP (!) 184/147 (BP Location: Right Arm)   Pulse (!) 120   Temp 97.8 F (36.6 C) (Oral)   Resp 19   Ht 6' (1.829 m)   Wt 79.1 kg   SpO2 93%   BMI 23.65 kg/m  SpO2: SpO2: 93 % O2 Device: O2 Device: Nasal Cannula O2  Flow Rate: O2 Flow Rate (L/min): 2 L/min  Intake/output summary:  Intake/Output Summary (Last 24 hours) at 04/04/2022 1033 Last data filed at 04/04/2022 0058 Gross per 24 hour  Intake 10 ml  Output 425 ml  Net -415 ml   LBM:   Baseline Weight: Weight: 80.4 kg (July 2023) Most recent weight: Weight: 79.1 kg       Palliative Assessment/Data: PPS 10%      Patient Active Problem List   Diagnosis Date Noted   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   Acute on chronic systolic heart failure ([place]) 01/08/2022   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[place]) 12/31/2021   Port-A-Cath in place 12/13/2021   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 male  with past medical history of CAD, CKD3, hypertension, persistent afib, prostate cancer, and gastric  cancer admitted on 04/01/2022 with AMS, hypothermia, bradycardia.    Patient treated for sepsis and work-up then revealed myxedema coma, AKI.  PMT has been consulted to assist with goals of care conversation.  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 Can consider scheduling dilaudid q6h if restlessness increases Tylenol PRN pain/fever Biotin twice daily Benadryl PRN itching Robinul PRN secretions Haldol PRN agitation/delirium Ativan PRN anxiety/seizure/sleep/distress Zofran PRN nausea/vomiting Liquifilm Tears PRN dry eye  Goals of Care and Additional Recommendations: Limitations on Scope of Treatment: Full Comfort Care  Code Status:    Code Status Orders  (From admission, onward)           Start     Ordered   04/03/22 1555  Do not attempt resuscitation (DNR)  Continuous       Question Answer Comment  In the event of cardiac or respiratory ARREST Do not call a "code blue"   In the event of cardiac or respiratory ARREST Do not perform Intubation, CPR, defibrillation or ACLS   In the event of cardiac or respiratory ARREST Use medication by any route, position, wound care, and other measures to relive pain and suffering. May use oxygen, suction and manual treatment of airway obstruction as needed for comfort.      04/03/22 1556           Code Status History     Date Active Date Inactive Code Status Order ID Comments User Context   04/01/2022 2047 04/03/2022 1556 DNR [id number]  Jacelyn Grip, MD ED   04/01/2022 2047 04/01/2022 2047 DNR [id number]   Jacelyn Grip, MD ED   01/09/2022 1426 01/22/2022 1805 DNR [id number]  Loistine Chance, MD Inpatient   12/31/2021 2012 01/09/2022 1426 Full Code [id number]  Leighton Ruff, MD Inpatient  12/31/2021 1440 12/31/2021 2012 DNR [id number]  Garald Balding, PA-C ED   08/19/2019 0131 08/21/2019 1831 Full Code [id number]  Vianne Bulls, MD ED   08/15/2019 2100 08/17/2019 2210 Full Code [id number]  Amie Portland, MD ED       Prognosis:  < 2 weeks  Discharge Planning: Hospice facility  Care plan was discussed with primary RN, TOC, [place] liaison  Thank you for allowing the Palliative Medicine Team to assist in the care of this patient.  Lin Landsman, NP  Please contact Palliative Medicine Team phone at [phone] for questions and concerns.   *Portions of this note are a verbal dictation therefore any spelling and/or grammatical errors are due to the "[place] One" system interpretation.

## 2022-04-04 NOTE — Plan of Care (Signed)
  Problem: Respiratory: Goal: Verbalizations of increased ease of respirations will increase Outcome: Progressing   Problem: Pain Management: Goal: Satisfaction with pain management regimen will improve Outcome: Progressing   

## 2022-04-04 NOTE — Progress Notes (Signed)
Manufacturing engineer [facility])  [facility] had a bed to offer Mr. Chio today.  Family initially on board with this transfer, however are not currently willing to sign necessary consents that will allow [name] to care for him.  His daughter requests that his brother provide [name] with permission.  [facility] will coordinate with Mr. Dehn brother Friday and see if he is willing to provide necessary consent for transfer, pending we still have an open bed.  Hospital team updated via epic chat.  Thank you, Venia Carbon DNP, RN [facility] Liaison

## 2022-04-05 DIAGNOSIS — C163 Malignant neoplasm of pyloric antrum: Secondary | ICD-10-CM | POA: Diagnosis not present

## 2022-04-05 DIAGNOSIS — N179 Acute kidney failure, unspecified: Secondary | ICD-10-CM | POA: Diagnosis not present

## 2022-04-05 DIAGNOSIS — E035 Myxedema coma: Secondary | ICD-10-CM | POA: Diagnosis not present

## 2022-04-05 DIAGNOSIS — Z515 Encounter for palliative care: Secondary | ICD-10-CM | POA: Diagnosis not present

## 2022-04-05 DIAGNOSIS — T68XXXA Hypothermia, initial encounter: Secondary | ICD-10-CM | POA: Diagnosis not present

## 2022-04-05 MED ORDER — LORAZEPAM 1 MG PO TABS: 1.0000 mg | ORAL_TABLET | ORAL | 0 refills | Status: DC | PRN

## 2022-04-05 MED ORDER — ONDANSETRON 4 MG PO TBDP: 4.0000 mg | ORAL_TABLET | Freq: Four times a day (QID) | ORAL | 0 refills | Status: DC | PRN

## 2022-04-05 MED ORDER — HALOPERIDOL 2 MG PO TABS: 2.0000 mg | ORAL_TABLET | Freq: Four times a day (QID) | ORAL | Status: DC | PRN

## 2022-04-05 MED ORDER — HYDROMORPHONE HCL 1 MG/ML IJ SOLN
0.5000 mg | Freq: Four times a day (QID) | INTRAMUSCULAR | Status: DC
  Administered 2022-04-05 (×2): 0.5 mg via INTRAVENOUS
  Filled 2022-04-05 (×2): qty 0.5

## 2022-04-05 MED ORDER — GLYCOPYRROLATE 1 MG PO TABS: 1.0000 mg | ORAL_TABLET | ORAL | Status: DC | PRN

## 2022-04-05 MED ORDER — HALOPERIDOL LACTATE 5 MG/ML IJ SOLN: 2.0000 mg | Freq: Four times a day (QID) | INTRAMUSCULAR | Status: DC | PRN

## 2022-04-05 MED ORDER — HYDROMORPHONE HCL 1 MG/ML IJ SOLN: 0.5000 mg | Freq: Four times a day (QID) | INTRAMUSCULAR | 0 refills | Status: DC

## 2022-04-05 MED ORDER — ONDANSETRON HCL 4 MG/2ML IJ SOLN: 4.0000 mg | Freq: Four times a day (QID) | INTRAMUSCULAR | 0 refills | Status: DC | PRN

## 2022-04-05 MED ORDER — DIPHENHYDRAMINE HCL 50 MG/ML IJ SOLN: 12.5000 mg | INTRAMUSCULAR | 0 refills | Status: DC | PRN

## 2022-04-05 MED ORDER — LORAZEPAM 2 MG/ML IJ SOLN: 1.0000 mg | INTRAMUSCULAR | 0 refills | Status: DC | PRN

## 2022-04-05 MED ORDER — GLYCOPYRROLATE 0.2 MG/ML IJ SOLN: 0.2000 mg | INTRAMUSCULAR | Status: DC | PRN

## 2022-04-05 MED ORDER — HYDROMORPHONE HCL 1 MG/ML IJ SOLN: 0.5000 mg | INTRAMUSCULAR | 0 refills | Status: DC | PRN

## 2022-04-05 MED ORDER — ACETAMINOPHEN 650 MG RE SUPP: 650.0000 mg | Freq: Four times a day (QID) | RECTAL | 0 refills | Status: DC | PRN

## 2022-04-05 MED ORDER — LORAZEPAM 2 MG/ML PO CONC: 1.0000 mg | ORAL | 0 refills | Status: DC | PRN

## 2022-04-05 MED ORDER — POLYVINYL ALCOHOL 1.4 % OP SOLN: 1.0000 [drp] | Freq: Four times a day (QID) | OPHTHALMIC | 0 refills | Status: DC | PRN

## 2022-04-05 MED ORDER — ACETAMINOPHEN 325 MG PO TABS: 650.0000 mg | ORAL_TABLET | Freq: Four times a day (QID) | ORAL | Status: DC | PRN

## 2022-04-05 MED ORDER — HALOPERIDOL LACTATE 2 MG/ML PO CONC: 2.0000 mg | Freq: Four times a day (QID) | ORAL | 0 refills | Status: DC | PRN

## 2022-04-05 NOTE — Care Management Important Message (Signed)
Important Message  Patient Details  Name: STRYDER POITRA MRN: [id number] Date of Birth: 09/07/1928   Medicare Important Message Given:  Yes     Shelda Altes 04/05/2022, 9:42 AM

## 2022-04-05 NOTE — Progress Notes (Signed)
Pt discharged with paperwork to [facility]. Transport here to receive and take pt with comfort care.

## 2022-04-05 NOTE — Discharge Summary (Addendum)
[facility] Discharge Summary  Patient name: Dillon Smith Medical record number: [id number] Date of birth: 1928-07-29 Age: 88 y.o. Gender: male Date of Admission: 04/01/2022  Date of Discharge: 04/05/22 Admitting Physician: August Albino, MD  Primary Care Provider: [name] Consultants: Palliative   Indication for Hospitalization: Myxedema coma, altered mental status  Discharge Diagnoses/Problem List:  Principal Problem for Admission: Myxedema coma Other Problems addressed during stay:  Principal Problem:   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[place]) Active Problems:   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[place])   Altered mental status   Comfort measures only status    Brief Hospital Course:  [name] is a 88 y.o.male with a history of CKD, CAD s/p CABG in 2007, HLD, HTN, A-fib, prostate cancer, CVA who was admitted to the [facility] Medicine Teaching Service at [facility] for myxedema coma. His hospital course is detailed below:   Myxedema Coma, comfort care Presented with altered mental status and shock with hypotension, bradycardia, hypothermia.  Found to have significantly elevated TSH and low T4.  Started on IV levothyroxine, hydrocortisone.  Also started on cefepime, Flagyl due to concern for possible sepsis.  Received vancomycin as well but this was discontinued due to AKI and a negative MRSA swab.  Of note, the patient's POA requested that the patient not receive any pressors or escalation of care, and the patient is DNR/DNI.  For this reason the patient was not admitted to ICU.  Upon goals of care discussion with daughter Reine [facility]), the decision was made to move to full comfort care on 10/4.  Upon this decision, all medical interventions were withdrawn and the patient was started on palliative measures.  Other chronic conditions were medically managed with home medications and formulary alternatives as necessary  A-fib-Eliquis was held due to  [place]     Disposition: Hospice care ([facility])  Discharge Condition: full comfort care    Discharge Exam:  Vitals:   04/04/22 2233 04/05/22 0856  BP: (!) 89/67 (!) 86/71  Pulse: 78 69  Resp: 18 17  Temp: 97.8 F (36.6 C)   SpO2: 92% 100%   Gen: NAD, sleeping comfortably, briefly awakens or twitches to stimuli CV: Difficult to appreciate heart sounds Pulm: CTAB on [place]  Significant Procedures: n/a  Significant Labs and Imaging:  No results for input(s): "WBC", "HGB", "HCT", "PLT" in the last 48 hours. Recent Labs  Lab 04/03/22 1252  NA 145  K 4.3  CL 113*  CO2 19*  GLUCOSE 116*  BUN 67*  CREATININE 4.33*  CALCIUM 8.6*   TSH 176.299 T4 0.55 T3 31 MRSA swab negative   CT HEAD  IMPRESSION: 1. No acute intracranial abnormality. 2. Small chronic right temporoparietal lobe infarct. 3. Generalized cerebral atrophy and microvascular disease changes of the supratentorial brain.  CXR IMPRESSION: 1. No acute intrathoracic process.    Discharge Medications:  Allergies as of 04/05/2022       Reactions   Penicillins Itching        Medication List     STOP taking these medications    amiodarone 200 MG tablet Commonly known as: PACERONE   apixaban 5 MG Tabs tablet Commonly known as: ELIQUIS   atorvastatin 40 MG tablet Commonly known as: LIPITOR   [employer] Senna 8.6 MG tablet Generic drug: senna   diphenhydrAMINE 25 mg capsule Commonly known as: BENADRYL Replaced by: diphenhydrAMINE 50 MG/ML injection   feeding supplement (PRO-STAT SUGAR FREE 64) Liqd   feeding supplement Liqd  Gerhardt's butt cream Crea   metoprolol succinate 25 MG 24 hr tablet Commonly known as: TOPROL-XL   MULTI VITAMIN DAILY PO   pantoprazole 40 MG tablet Commonly known as: PROTONIX   phenol 1.4 % Liqd Commonly known as: CHLORASEPTIC   QUEtiapine 50 MG tablet Commonly known as: SEROQUEL   thiamine 100 MG tablet Commonly known as: VITAMIN B1       TAKE  these medications    acetaminophen 325 MG tablet Commonly known as: TYLENOL Take 2 tablets (650 mg total) by mouth every 6 (six) hours as needed for mild pain (or Fever >/= 101).   acetaminophen 650 MG suppository Commonly known as: TYLENOL Place 1 suppository (650 mg total) rectally every 6 (six) hours as needed for mild pain (or Fever >/= 101).   diphenhydrAMINE 50 MG/ML injection Commonly known as: BENADRYL Inject 0.25 mLs (12.5 mg total) into the vein every 4 (four) hours as needed for itching. Replaces: diphenhydrAMINE 25 mg capsule   fentaNYL 25 MCG/HR Commonly known as: [place] 1 patch onto the skin every 3 (three) days.   glycopyrrolate 1 MG tablet Commonly known as: ROBINUL Take 1 tablet (1 mg total) by mouth every 4 (four) hours as needed (excessive secretions).   glycopyrrolate 0.2 MG/ML injection Commonly known as: ROBINUL Inject 1 mL (0.2 mg total) into the skin every 4 (four) hours as needed (excessive secretions).   glycopyrrolate 0.2 MG/ML injection Commonly known as: ROBINUL Inject 1 mL (0.2 mg total) into the vein every 4 (four) hours as needed (excessive secretions).   haloperidol 2 MG tablet Commonly known as: HALDOL Take 1 tablet (2 mg total) by mouth every 6 (six) hours as needed for agitation (or delirium).   haloperidol 2 MG/ML solution Commonly known as: HALDOL Place 1 mL (2 mg total) under the tongue every 6 (six) hours as needed for agitation (or delirium).   haloperidol lactate 5 MG/ML injection Commonly known as: HALDOL Inject 0.4 mLs (2 mg total) into the vein every 6 (six) hours as needed (or delirium).   HYDROmorphone 2 MG tablet Commonly known as: DILAUDID Take 1 tablet (2 mg total) by mouth every 4 (four) hours as needed for severe pain. What changed: Another medication with the same name was added. Make sure you understand how and when to take each.   HYDROmorphone 1 MG/ML injection Commonly known as: DILAUDID Inject 0.5-1  mLs (0.5-1 mg total) into the vein every 2 (two) hours as needed for moderate pain or severe pain (dyspnea, increased work of breathing, RR >25, distress). What changed: You were already taking a medication with the same name, and this prescription was added. Make sure you understand how and when to take each.   HYDROmorphone 1 MG/ML injection Commonly known as: DILAUDID Inject 0.5 mLs (0.5 mg total) into the vein every 6 (six) hours. What changed: You were already taking a medication with the same name, and this prescription was added. Make sure you understand how and when to take each.   LORazepam 1 MG tablet Commonly known as: ATIVAN Take 1 tablet (1 mg total) by mouth every hour as needed for anxiety, seizure or sleep (distress).   LORazepam 2 MG/ML concentrated solution Commonly known as: ATIVAN Place 0.5 mLs (1 mg total) under the tongue every hour as needed for anxiety, seizure or sleep (distress).   LORazepam 2 MG/ML injection Commonly known as: ATIVAN Inject 0.5 mLs (1 mg total) into the vein every hour as needed for anxiety or  seizure (distress).   ondansetron 4 MG disintegrating tablet Commonly known as: ZOFRAN-ODT Take 1 tablet (4 mg total) by mouth every 6 (six) hours as needed for nausea.   ondansetron 4 MG/2ML Soln injection Commonly known as: ZOFRAN Inject 2 mLs (4 mg total) into the vein every 6 (six) hours as needed for nausea.   polyvinyl alcohol 1.4 % ophthalmic solution Commonly known as: LIQUIFILM TEARS Place 1 drop into both eyes 4 (four) times daily as needed for dry eyes.        Discharge Instructions: Please refer to Patient Instructions section of EMR for full details.  Patient was counseled important signs and symptoms that should prompt return to medical care, changes in medications, dietary instructions, activity restrictions, and follow up appointments.   Follow-Up Appointments:   August Albino, MD 04/05/2022, 12:32 PM PGY-1, [place]  [facility]  I was personally present and performed or re-performed the history, physical exam and medical decision making activities of this service and have verified that the service and findings are accurately documented in the resident's note.  Zola Button, MD                  04/05/2022, 12:39 PM

## 2022-04-05 NOTE — Progress Notes (Addendum)
[facility])   [place] has a bed again today for Dillon Smith.   Per request of his daughter, attempted to reach his brother to get necessary consents in order for [name] to care for him at [facility].   No answer, left message requesting a return call if the family is still interested in [facility].  [facility] will update if we receive communication back from the family.  Thank you, Venia Carbon DNP, RN [facility] Liaison    **addendum, family is agreeable to transfer to [facility] for EOL care. Dtr Dillon Smith is in the process of completing necessary paperwork.

## 2022-04-05 NOTE — Progress Notes (Signed)
Daily Progress Note   Patient Name: Dillon Smith       Date: 04/05/2022 DOB: 02/05/1929  Age: 88 y.o. MRN#: [id number] Attending Physician: [name], MD Primary Care Physician: [name] Admit Date: 04/01/2022  Reason for Consultation/Follow-up: Establishing goals of care  Subjective: Chart review performed. Received report from primary RN - no acute concerns. RN notes patient has urinary retention - patient may need foley placement today per previously placed Bladder Scan order.  Went to visit patient at bedside - daughter/Jean at bedside. Patient is lying in bed intermittently awake/asleep - restless while awake. No respiratory distress, increased work of breathing, or secretions noted. He is on 2L O2 [place].   Emotional support provided. Reviewed option of transfer to [facility] - she does not want to sign consents for transfer due to possibility she may be personally financially liable for any payments. She has discussed with hospice financial liaison. She and her family are having ongoing discussions with TOC and [facility] hospice on discharge/transfer planning. Patient remains stable for transfer if she agrees.   Reviewed option of home hospice per her request. This would be her last choice, but may be agreeable if needed. She is hopeful patient can either remain in house for EOL care vs transfer to hospice if another family can sign consents. Validated that medical team would support her decisions and ensure patient was comfortable at EOL.  [name] sees purewick output and asks if patient is on IVF. Reviewed this intervention has been discontinued and all output is due to letting nature take it's course. Education provided on nurse findings of urinary retention and possibility  of foley placement today - educated on natural trajectory at EOL, which can include urinary retention. She expressed understanding and is agreeable to foley if indicated. She again reiterates letting nature take it's course, not getting IVF, is what patient would want.  Reviewed symptom management plan for scheduled dilaudid - she expressed understanding and is agreeable.  Cup of coffee offered and provided to her for comfort/support.   All questions and concerns addressed. Encouraged to call with questions and/or concerns. PMT card previously provided.  Requested comfort tray delivery.  Length of Stay: 3  Current Medications: Scheduled Meds:   antiseptic oral rinse  15 mL Topical BID   sodium chloride flush  10-40 mL Intracatheter Q12H    Continuous Infusions:   PRN Meds: acetaminophen **OR** acetaminophen, diphenhydrAMINE, glycopyrrolate **OR** glycopyrrolate **OR** glycopyrrolate, haloperidol **OR** haloperidol **OR** haloperidol lactate, HYDROmorphone (DILAUDID) injection, LORazepam **OR** LORazepam **OR** LORazepam, ondansetron **OR** ondansetron (ZOFRAN) IV, polyvinyl alcohol, sodium chloride flush  Physical Exam Vitals and nursing note reviewed.  Constitutional:      General: He is not in acute distress.    Appearance: He is ill-appearing.  Pulmonary:     Effort: No respiratory distress.  Skin:    General: Skin is warm and dry.  Neurological:     Mental Status: He is alert and oriented to person, place, and time.     Motor: Weakness present.  Psychiatric:        Attention and Perception: Attention normal.        Speech: He is noncommunicative.        Behavior: Behavior is cooperative.        Cognition and Memory: Cognition and memory normal.             Vital Signs: BP (!) 86/71 (BP Location: Left Leg)   Pulse 69   Temp 97.8 F (36.6 C) (Axillary)   Resp 17   Ht 6' (1.829 m)   Wt 79.1 kg   SpO2 100%   BMI 23.65 kg/m  SpO2: SpO2: 100 % O2 Device: O2  Device: Nasal Cannula O2 Flow Rate: O2 Flow Rate (L/min): 2 L/min  Intake/output summary:  Intake/Output Summary (Last 24 hours) at 04/05/2022 0943 Last data filed at 04/04/2022 2100 Gross per 24 hour  Intake 0 ml  Output 350 ml  Net -350 ml   LBM:   Baseline Weight: Weight: 80.4 kg (July 2023) Most recent weight: Weight: 79.1 kg       Palliative Assessment/Data: PPS 10%      Patient Active Problem List   Diagnosis Date Noted   Altered mental status    Comfort measures only status    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[place]) 04/01/2022   Acute on chronic systolic heart failure ([place]) 01/08/2022   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[place]) 12/31/2021   Port-A-Cath in place 12/13/2021   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[place]) 10/29/2021   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 08/19/2019   Symptomatic bradycardia 08/19/2019   Bradycardia 08/18/2019   COVID-19 virus infection 08/17/2019   Hyperlipidemia 08/17/2019   Stage 3a chronic kidney disease (CKD) ([place]) 08/17/2019   Acute ischemic stroke (HCC) mult R MCA and PCA embolic infarcts d/t AF not on AC s/p tPA 08/15/2019   CARCINOMA, PROSTATE 09/15/2007   Essential hypertension 09/15/2007   Coronary artery disease 09/15/2007   HEMORRHOIDS, INTERNAL 09/15/2007   RADIATION PROCTITIS 09/15/2007    Palliative Care Assessment & Plan   Patient Profile: 88 y.o. male  with past medical history of CAD, CKD3, hypertension, persistent afib, prostate cancer, and gastric cancer admitted on 04/01/2022 with AMS, hypothermia, bradycardia.    Patient treated for sepsis and work-up then revealed myxedema coma, AKI.  PMT has been consulted to assist with goals of care conversation.  Assessment: Principal Problem:   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HCC) Active Problems:   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HCC)   Altered mental status   Comfort measures only status   Terminal care  Recommendations/Plan: Continue full comfort measures Continue DNR/DNI as previously documented Transfer  to [facility] pending bed availability Continue current comfort focused medication regimen - added scheduled dilaudid q6h  PMT will continue to follow and support holistically   Symptom  Management Scheduled dilaudid q6h; continue PRN dose for breakthrough symptoms pain/dyspnea/increased work of breathing/RR>25.  Tylenol PRN pain/fever Biotin twice daily Benadryl PRN itching Robinul PRN secretions Haldol PRN agitation/delirium Ativan PRN anxiety/seizure/sleep/distress Zofran PRN nausea/vomiting Liquifilm Tears PRN dry eye  Goals of Care and Additional Recommendations: Limitations on Scope of Treatment: Full Comfort Care  Code Status:    Code Status Orders  (From admission, onward)           Start     Ordered   04/03/22 1555  Do not attempt resuscitation (DNR)  Continuous       Question Answer Comment  In the event of cardiac or respiratory ARREST Do not call a "code blue"   In the event of cardiac or respiratory ARREST Do not perform Intubation, CPR, defibrillation or ACLS   In the event of cardiac or respiratory ARREST Use medication by any route, position, wound care, and other measures to relive pain and suffering. May use oxygen, suction and manual treatment of airway obstruction as needed for comfort.      04/03/22 1556           Code Status History     Date Active Date Inactive Code Status Order ID Comments User Context   04/01/2022 2047 04/03/2022 1556 DNR [id number]  Jacelyn Grip, MD ED   04/01/2022 2047 04/01/2022 2047 DNR [id number]  Jacelyn Grip, MD ED   01/09/2022 1426 01/22/2022 1805 DNR [id number]  Loistine Chance, MD Inpatient   12/31/2021 2012 01/09/2022 1426 Full Code [id number]  Leighton Ruff, MD Inpatient   12/31/2021 1440 12/31/2021 2012 DNR [id number]  Garald Balding, PA-C ED   08/19/2019 0131 08/21/2019 1831 Full Code [id number]  Vianne Bulls, MD ED   08/15/2019 2100 08/17/2019 2210 Full Code [id number]  Amie [place], MD ED       Prognosis:  < 2  weeks  Discharge Planning: Hospice facility vs hospital death  Care plan was discussed with primary RN, patient's daughter, [facility], attending team  Thank you for allowing the Palliative Medicine Team to assist in the care of this patient.  Lin Landsman, NP  Please contact Palliative Medicine Team phone at [phone] for questions and concerns.   *Portions of this note are a verbal dictation therefore any spelling and/or grammatical errors are due to the "[employer] One" system interpretation.

## 2022-04-05 NOTE — TOC Progression Note (Signed)
Transition of Care [facility]) - Progression Note    Patient Details  Name: Dillon Smith MRN: [id number] Date of Birth: 01/08/1929  [facility] of [facility] CM/SW [place], [place] Phone Number: 04/05/2022, 3:37 PM  Clinical Narrative:     Patient will DC to: [facility] Place Anticipated DC date: 04/05/22 Family notified: Daughter, [name] Transport by: Corey Harold   Per MD patient ready for DC to [facility]. RN, patient, patient's family, and facility notified of DC. Discharge Summary sent to facility. RN to call report prior to discharge [phone]). DC packet on chart. Ambulance transport requested for patient.   CSW will sign off for now as social work intervention is no longer needed. Please consult [name] again if new needs arise.   Expected Discharge Plan: [place] Barriers to Discharge: No Barriers Identified  Expected Discharge Plan and Services Expected Discharge Plan: [place] Choice: Residential Hospice Bed Living arrangements for the past 2 months: [place] Expected Discharge Date: 04/05/22                                     Social Determinants of Health (SDOH) Interventions    Readmission Risk Interventions     No data to display

## 2022-04-05 NOTE — Progress Notes (Signed)
[facility])  There is a bed at [facility] today.  All consents are completed.  RN staff, please call report to [phone], please leave IV and foley in place (if applicable).  If family is not at the bedside when transport arrives, please call [name] and let her know.  Thank you, Venia Carbon DNP, RN [facility] Liaison

## 2022-05-01 DEATH — deceased

## 2023-12-03 IMAGING — US IR IMAGING GUIDED PORT INSERTION
1 series · 1 of 1 positions shown · non-contrast
Comparison: None Available.

INDICATION: History of gastric adenocarcinoma. In need of durable intravenous
access for chemotherapy administration.

EXAM:
IMPLANTED PORT A CATH PLACEMENT WITH ULTRASOUND AND FLUOROSCOPIC
GUIDANCE

[Series 1: ir fluoro/shunt/fist · 1 of 1 slices shown]
[im 1/1]
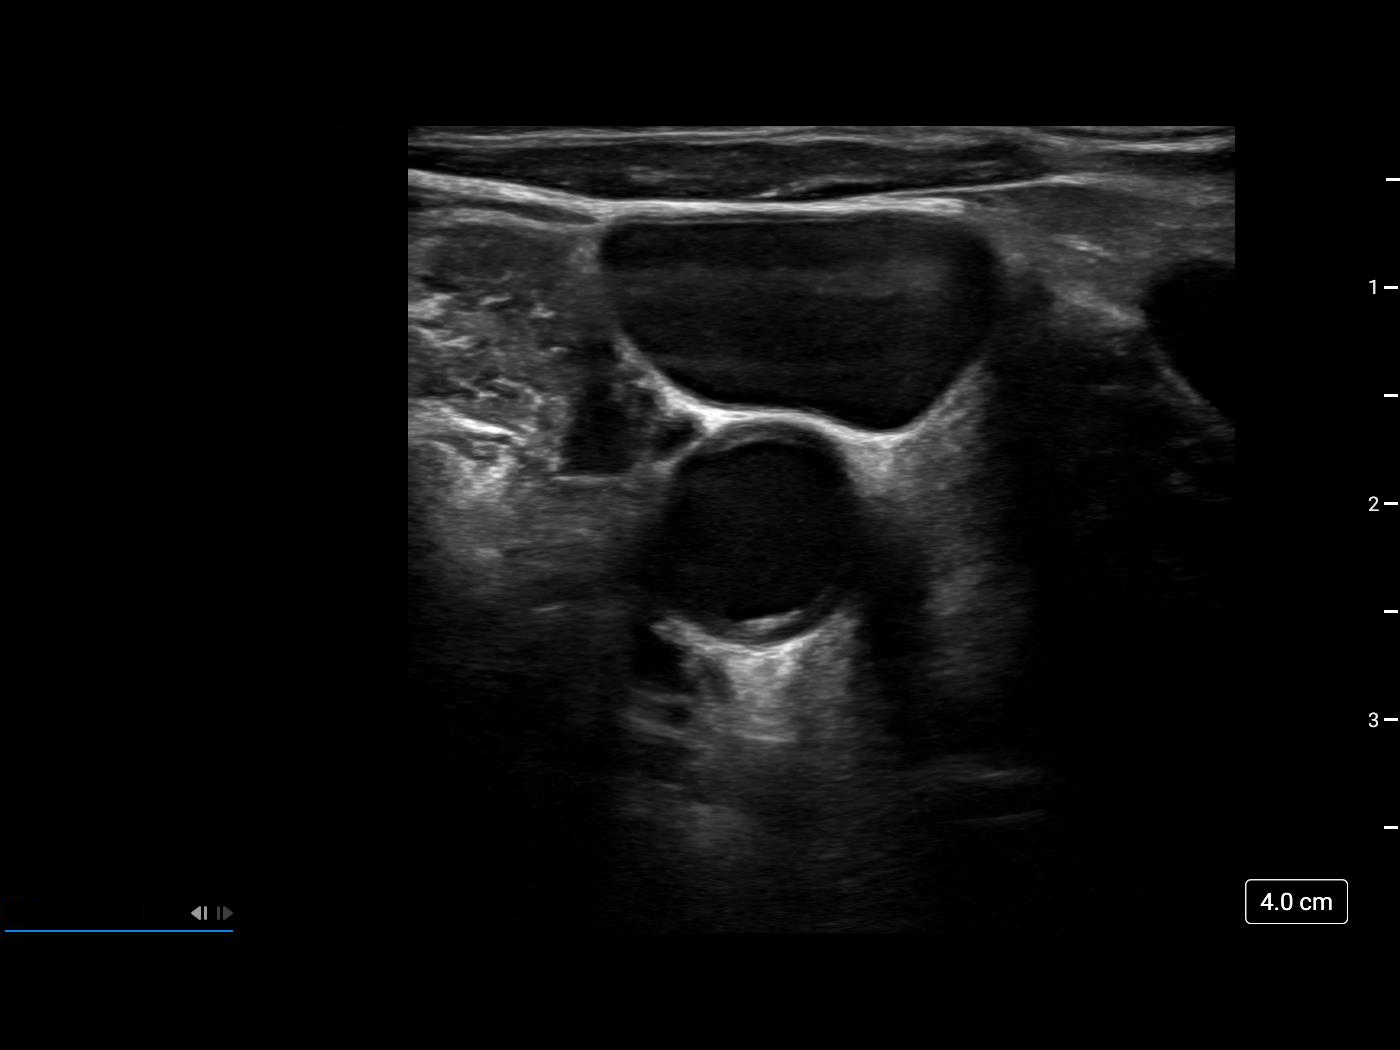

[1 of 1 positions shown; findings below may reference images not displayed]

MEDICATIONS:
None

ANESTHESIA/SEDATION:
Moderate (conscious) sedation was employed during this procedure as
administered by the [REDACTED].

A total of Versed 2 mg and Fentanyl 100 mcg was administered
intravenously.

Moderate Sedation Time: 30 minutes. The patient's level of
consciousness and vital signs were monitored continuously by
radiology nursing throughout the procedure under my direct
supervision.

CONTRAST:  None

FLUOROSCOPY TIME:  42 seconds (5 mGy)

COMPLICATIONS:
None immediate.

PROCEDURE:
The procedure, risks, benefits, and alternatives were explained to
the patient. Questions regarding the procedure were encouraged and
answered. The patient understands and consents to the procedure.

The right neck and chest were prepped with chlorhexidine in a
sterile fashion, and a sterile drape was applied covering the
operative field. Maximum barrier sterile technique with sterile
gowns and gloves were used for the procedure. A timeout was
performed prior to the initiation of the procedure. Local anesthesia
was provided with 1% lidocaine with epinephrine.

After creating a small venotomy incision, a micropuncture kit was
utilized to access the internal jugular vein. Real-time ultrasound
guidance was utilized for vascular access including the acquisition
of a permanent ultrasound image documenting patency of the accessed
vessel. The microwire was utilized to measure appropriate catheter
length.

A subcutaneous port pocket was then created along the upper chest
wall utilizing a combination of sharp and blunt dissection. The
pocket was irrigated with sterile saline. A single lumen Slim sized
power injectable port was chosen for placement. The 8 Fr catheter
was tunneled from the port pocket site to the venotomy incision. The
port was placed in the pocket. The external catheter was trimmed to
appropriate length. At the venotomy, an 8 Fr peel-away sheath was
placed over a guidewire under fluoroscopic guidance. The catheter
was then placed through the sheath and the sheath was removed. Final
catheter positioning was confirmed and documented with a
fluoroscopic spot radiograph. The port was accessed with Htezil Zilker
needle, aspirated and flushed with heparinized saline.

The venotomy site was closed with an interrupted 4-0 Vicryl suture.
The port pocket incision was closed with interrupted 2-0 Vicryl
suture. Dermabond and Gene were applied to both incisions.
Dressings were applied. The patient tolerated the procedure well
without immediate post procedural complication.
FINDINGS: After catheter placement, the tip lies within the superior
cavoatrial junction. The catheter aspirates and flushes normally and
is ready for immediate use.
IMPRESSION: Successful placement of a right internal jugular approach power
injectable Port-A-Cath. The catheter is ready for immediate use.
